# Patient Record
Sex: Female | Born: 1935 | Race: White | Hispanic: No | Smoking: Former smoker
Health system: Southern US, Community
[De-identification: ages and names within clinical notes are randomized; demographics above are authoritative.]

## PROBLEM LIST (undated history)

## (undated) DIAGNOSIS — L02415 Cutaneous abscess of right lower limb: Secondary | ICD-10-CM

## (undated) DIAGNOSIS — E785 Hyperlipidemia, unspecified: Secondary | ICD-10-CM

## (undated) DIAGNOSIS — I10 Essential (primary) hypertension: Secondary | ICD-10-CM

## (undated) DIAGNOSIS — K269 Duodenal ulcer, unspecified as acute or chronic, without hemorrhage or perforation: Secondary | ICD-10-CM

## (undated) DIAGNOSIS — J189 Pneumonia, unspecified organism: Secondary | ICD-10-CM

## (undated) DIAGNOSIS — F329 Major depressive disorder, single episode, unspecified: Secondary | ICD-10-CM

## (undated) DIAGNOSIS — C801 Malignant (primary) neoplasm, unspecified: Secondary | ICD-10-CM

## (undated) DIAGNOSIS — M1990 Unspecified osteoarthritis, unspecified site: Secondary | ICD-10-CM

## (undated) DIAGNOSIS — H3530 Unspecified macular degeneration: Secondary | ICD-10-CM

## (undated) DIAGNOSIS — T8131XA Disruption of external operation (surgical) wound, not elsewhere classified, initial encounter: Secondary | ICD-10-CM

## (undated) DIAGNOSIS — Z8719 Personal history of other diseases of the digestive system: Secondary | ICD-10-CM

## (undated) DIAGNOSIS — R3915 Urgency of urination: Secondary | ICD-10-CM

## (undated) DIAGNOSIS — M25569 Pain in unspecified knee: Secondary | ICD-10-CM

## (undated) DIAGNOSIS — K449 Diaphragmatic hernia without obstruction or gangrene: Secondary | ICD-10-CM

## (undated) DIAGNOSIS — M549 Dorsalgia, unspecified: Secondary | ICD-10-CM

## (undated) DIAGNOSIS — K922 Gastrointestinal hemorrhage, unspecified: Secondary | ICD-10-CM

## (undated) HISTORY — DX: Unspecified macular degeneration: H35.30

## (undated) HISTORY — PX: HERNIA REPAIR: SHX51

## (undated) HISTORY — DX: Essential (primary) hypertension: I10

## (undated) HISTORY — DX: Disruption of external operation (surgical) wound, not elsewhere classified, initial encounter: T81.31XA

## (undated) HISTORY — PX: THYROIDECTOMY: SHX17

## (undated) HISTORY — DX: Major depressive disorder, single episode, unspecified: F32.9

## (undated) HISTORY — DX: Hyperlipidemia, unspecified: E78.5

## (undated) HISTORY — PX: TUBAL LIGATION: SHX77

## (undated) HISTORY — DX: Cutaneous abscess of right lower limb: L02.415

## (undated) HISTORY — PX: CARDIAC CATHETERIZATION: SHX172

## (undated) HISTORY — PX: OTHER SURGICAL HISTORY: SHX169

## (undated) HISTORY — PX: EYE SURGERY: SHX253

## (undated) HISTORY — PX: RESECTION DISTAL CLAVICAL: SHX5053

## (undated) HISTORY — DX: Diaphragmatic hernia without obstruction or gangrene: K44.9

## (undated) HISTORY — DX: Gastrointestinal hemorrhage, unspecified: K92.2

## (undated) HISTORY — DX: Dorsalgia, unspecified: M54.9

## (undated) HISTORY — DX: Pain in unspecified knee: M25.569

---
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
## 1973-05-29 HISTORY — PX: ABDOMINAL HYSTERECTOMY: SHX81

## 1987-05-30 HISTORY — PX: OOPHORECTOMY: SHX86

## 1987-05-30 HISTORY — PX: BILATERAL SALPINGECTOMY: SHX5743

## 1997-05-29 LAB — HM COLONOSCOPY

## 1997-12-30 ENCOUNTER — Other Ambulatory Visit: Admission: RE | Admit: 1997-12-30 | Discharge: 1997-12-30 | Payer: Self-pay | Admitting: *Deleted

## 1998-05-29 HISTORY — PX: BACK SURGERY: SHX140

## 1998-07-19 ENCOUNTER — Ambulatory Visit (HOSPITAL_COMMUNITY): Admission: RE | Admit: 1998-07-19 | Discharge: 1998-07-19 | Payer: Self-pay | Admitting: *Deleted

## 1998-07-21 ENCOUNTER — Ambulatory Visit (HOSPITAL_COMMUNITY): Admission: RE | Admit: 1998-07-21 | Discharge: 1998-07-21 | Payer: Self-pay | Admitting: Gastroenterology

## 1998-07-21 ENCOUNTER — Encounter: Payer: Self-pay | Admitting: Gastroenterology

## 1999-01-17 ENCOUNTER — Other Ambulatory Visit: Admission: RE | Admit: 1999-01-17 | Discharge: 1999-01-17 | Payer: Self-pay | Admitting: *Deleted

## 1999-02-17 HISTORY — PX: LUMBAR LAMINECTOMY: SHX95

## 1999-05-30 HISTORY — PX: ROTATOR CUFF REPAIR: SHX139

## 1999-09-13 ENCOUNTER — Ambulatory Visit (HOSPITAL_COMMUNITY): Admission: RE | Admit: 1999-09-13 | Discharge: 1999-09-13 | Payer: Self-pay | Admitting: *Deleted

## 1999-10-01 ENCOUNTER — Emergency Department (HOSPITAL_COMMUNITY): Admission: EM | Admit: 1999-10-01 | Discharge: 1999-10-01 | Payer: Self-pay

## 2000-02-06 ENCOUNTER — Encounter: Payer: Self-pay | Admitting: Orthopedic Surgery

## 2000-02-06 ENCOUNTER — Encounter: Admission: RE | Admit: 2000-02-06 | Discharge: 2000-02-06 | Payer: Self-pay | Admitting: Orthopedic Surgery

## 2000-02-07 ENCOUNTER — Ambulatory Visit (HOSPITAL_BASED_OUTPATIENT_CLINIC_OR_DEPARTMENT_OTHER): Admission: RE | Admit: 2000-02-07 | Discharge: 2000-02-08 | Payer: Self-pay | Admitting: Orthopedic Surgery

## 2000-09-19 ENCOUNTER — Encounter: Payer: Self-pay | Admitting: Internal Medicine

## 2000-09-19 ENCOUNTER — Ambulatory Visit (HOSPITAL_COMMUNITY): Admission: RE | Admit: 2000-09-19 | Discharge: 2000-09-19 | Payer: Self-pay | Admitting: Internal Medicine

## 2000-10-23 ENCOUNTER — Encounter: Payer: Self-pay | Admitting: Internal Medicine

## 2001-12-05 ENCOUNTER — Ambulatory Visit (HOSPITAL_COMMUNITY): Admission: RE | Admit: 2001-12-05 | Discharge: 2001-12-05 | Payer: Self-pay | Admitting: Internal Medicine

## 2001-12-05 ENCOUNTER — Encounter: Payer: Self-pay | Admitting: Internal Medicine

## 2001-12-09 ENCOUNTER — Encounter: Payer: Self-pay | Admitting: Internal Medicine

## 2003-05-26 ENCOUNTER — Ambulatory Visit (HOSPITAL_COMMUNITY): Admission: RE | Admit: 2003-05-26 | Discharge: 2003-05-26 | Payer: Self-pay | Admitting: Internal Medicine

## 2004-03-30 ENCOUNTER — Ambulatory Visit: Payer: Self-pay | Admitting: Internal Medicine

## 2004-06-28 ENCOUNTER — Ambulatory Visit: Payer: Self-pay | Admitting: Family Medicine

## 2004-07-20 ENCOUNTER — Ambulatory Visit: Payer: Self-pay | Admitting: Internal Medicine

## 2004-07-22 ENCOUNTER — Ambulatory Visit: Payer: Self-pay | Admitting: Internal Medicine

## 2004-09-20 ENCOUNTER — Ambulatory Visit: Payer: Self-pay | Admitting: Internal Medicine

## 2005-03-29 ENCOUNTER — Ambulatory Visit: Payer: Self-pay | Admitting: Internal Medicine

## 2005-07-05 ENCOUNTER — Ambulatory Visit (HOSPITAL_COMMUNITY): Admission: RE | Admit: 2005-07-05 | Discharge: 2005-07-05 | Payer: Self-pay | Admitting: Internal Medicine

## 2005-08-14 ENCOUNTER — Ambulatory Visit: Payer: Self-pay | Admitting: Internal Medicine

## 2005-09-25 ENCOUNTER — Ambulatory Visit: Payer: Self-pay | Admitting: Internal Medicine

## 2005-12-13 ENCOUNTER — Ambulatory Visit: Payer: Self-pay | Admitting: Internal Medicine

## 2006-02-19 ENCOUNTER — Ambulatory Visit: Payer: Self-pay | Admitting: Internal Medicine

## 2006-02-27 ENCOUNTER — Ambulatory Visit: Payer: Self-pay | Admitting: Internal Medicine

## 2006-03-05 ENCOUNTER — Encounter: Admission: RE | Admit: 2006-03-05 | Discharge: 2006-03-05 | Payer: Self-pay | Admitting: Internal Medicine

## 2006-03-09 ENCOUNTER — Ambulatory Visit: Payer: Self-pay | Admitting: Internal Medicine

## 2006-03-09 DIAGNOSIS — I10 Essential (primary) hypertension: Secondary | ICD-10-CM | POA: Insufficient documentation

## 2006-03-09 DIAGNOSIS — E785 Hyperlipidemia, unspecified: Secondary | ICD-10-CM

## 2006-03-09 HISTORY — DX: Essential (primary) hypertension: I10

## 2006-03-09 HISTORY — DX: Hyperlipidemia, unspecified: E78.5

## 2006-07-17 ENCOUNTER — Ambulatory Visit (HOSPITAL_COMMUNITY): Admission: RE | Admit: 2006-07-17 | Discharge: 2006-07-17 | Payer: Self-pay | Admitting: Internal Medicine

## 2006-09-06 ENCOUNTER — Encounter: Payer: Self-pay | Admitting: Internal Medicine

## 2006-10-03 ENCOUNTER — Ambulatory Visit: Payer: Self-pay | Admitting: Internal Medicine

## 2006-10-03 LAB — CONVERTED CEMR LAB
ALT: 15 units/L (ref 0–40)
AST: 19 units/L (ref 0–37)
Albumin: 3.8 g/dL (ref 3.5–5.2)
Alkaline Phosphatase: 47 units/L (ref 39–117)
BUN: 31 mg/dL — ABNORMAL HIGH (ref 6–23)
Bilirubin, Direct: 0.1 mg/dL (ref 0.0–0.3)
CO2: 33 meq/L — ABNORMAL HIGH (ref 19–32)
Calcium: 9.3 mg/dL (ref 8.4–10.5)
Chloride: 106 meq/L (ref 96–112)
Cholesterol: 178 mg/dL (ref 0–200)
Creatinine, Ser: 0.9 mg/dL (ref 0.4–1.2)
Direct LDL: 90 mg/dL
GFR calc Af Amer: 76 mL/min
GFR calc non Af Amer: 63 mL/min
Glucose, Bld: 100 mg/dL — ABNORMAL HIGH (ref 70–99)
HDL: 54.7 mg/dL (ref >39.0)
Potassium: 3.6 meq/L (ref 3.5–5.1)
Sodium: 143 meq/L (ref 135–145)
Total Bilirubin: 1 mg/dL (ref 0.3–1.2)
Total CHOL/HDL Ratio: 3.3
Total Protein: 6.2 g/dL (ref 6.0–8.3)
Triglycerides: 251 mg/dL (ref 0–149)
VLDL: 50 mg/dL — ABNORMAL HIGH (ref 0–40)

## 2006-12-17 DIAGNOSIS — K449 Diaphragmatic hernia without obstruction or gangrene: Secondary | ICD-10-CM

## 2006-12-17 HISTORY — DX: Diaphragmatic hernia without obstruction or gangrene: K44.9

## 2007-02-25 ENCOUNTER — Ambulatory Visit: Payer: Self-pay | Admitting: Internal Medicine

## 2007-02-25 ENCOUNTER — Ambulatory Visit: Payer: Self-pay | Admitting: Cardiology

## 2007-02-25 ENCOUNTER — Observation Stay (HOSPITAL_COMMUNITY): Admission: EM | Admit: 2007-02-25 | Discharge: 2007-02-27 | Payer: Self-pay | Admitting: Emergency Medicine

## 2007-03-06 ENCOUNTER — Encounter: Payer: Self-pay | Admitting: Internal Medicine

## 2007-03-07 ENCOUNTER — Ambulatory Visit: Payer: Self-pay

## 2007-03-07 ENCOUNTER — Encounter: Payer: Self-pay | Admitting: Internal Medicine

## 2007-03-08 ENCOUNTER — Ambulatory Visit: Payer: Self-pay | Admitting: Internal Medicine

## 2007-03-08 ENCOUNTER — Encounter: Admission: RE | Admit: 2007-03-08 | Discharge: 2007-03-08 | Payer: Self-pay | Admitting: Gastroenterology

## 2007-03-08 DIAGNOSIS — M549 Dorsalgia, unspecified: Secondary | ICD-10-CM | POA: Insufficient documentation

## 2007-03-08 HISTORY — DX: Dorsalgia, unspecified: M54.9

## 2007-03-11 ENCOUNTER — Encounter: Payer: Self-pay | Admitting: Internal Medicine

## 2007-04-01 ENCOUNTER — Ambulatory Visit: Payer: Self-pay | Admitting: Cardiology

## 2007-04-23 ENCOUNTER — Ambulatory Visit: Payer: Self-pay | Admitting: Internal Medicine

## 2007-06-06 ENCOUNTER — Ambulatory Visit: Payer: Self-pay | Admitting: Internal Medicine

## 2007-06-06 LAB — CONVERTED CEMR LAB
ALT: 18 units/L (ref 0–35)
AST: 20 units/L (ref 0–37)
Albumin: 3.9 g/dL (ref 3.5–5.2)
Alkaline Phosphatase: 52 units/L (ref 39–117)
BUN: 27 mg/dL — ABNORMAL HIGH (ref 6–23)
Bilirubin, Direct: 0.2 mg/dL (ref 0.0–0.3)
CO2: 32 meq/L (ref 19–32)
Calcium: 9.2 mg/dL (ref 8.4–10.5)
Chloride: 97 meq/L (ref 96–112)
Cholesterol: 204 mg/dL (ref 0–200)
Creatinine, Ser: 0.9 mg/dL (ref 0.4–1.2)
Direct LDL: 118.7 mg/dL
GFR calc Af Amer: 76 mL/min
GFR calc non Af Amer: 63 mL/min
Glucose, Bld: 86 mg/dL (ref 70–99)
HDL: 52 mg/dL (ref >39.0)
Potassium: 3.6 meq/L (ref 3.5–5.1)
Sodium: 142 meq/L (ref 135–145)
TSH: 2.82 microintl units/mL (ref 0.35–5.50)
Total Bilirubin: 0.9 mg/dL (ref 0.3–1.2)
Total CHOL/HDL Ratio: 3.9
Total Protein: 6.4 g/dL (ref 6.0–8.3)
Triglycerides: 150 mg/dL — ABNORMAL HIGH (ref 0–149)
VLDL: 30 mg/dL (ref 0–40)

## 2007-06-19 ENCOUNTER — Ambulatory Visit: Payer: Self-pay | Admitting: Internal Medicine

## 2007-08-09 ENCOUNTER — Ambulatory Visit (HOSPITAL_COMMUNITY): Admission: RE | Admit: 2007-08-09 | Discharge: 2007-08-09 | Payer: Self-pay | Admitting: Internal Medicine

## 2007-09-05 ENCOUNTER — Encounter: Payer: Self-pay | Admitting: Internal Medicine

## 2007-09-24 ENCOUNTER — Ambulatory Visit: Payer: Self-pay | Admitting: Internal Medicine

## 2007-09-25 LAB — CONVERTED CEMR LAB
ALT: 19 units/L (ref 0–35)
AST: 26 units/L (ref 0–37)
Albumin: 3.8 g/dL (ref 3.5–5.2)
Alkaline Phosphatase: 56 units/L (ref 39–117)
BUN: 24 mg/dL — ABNORMAL HIGH (ref 6–23)
Bilirubin, Direct: 0.1 mg/dL (ref 0.0–0.3)
CO2: 31 meq/L (ref 19–32)
Calcium: 9.8 mg/dL (ref 8.4–10.5)
Chloride: 106 meq/L (ref 96–112)
Creatinine, Ser: 0.9 mg/dL (ref 0.4–1.2)
GFR calc Af Amer: 76 mL/min
GFR calc non Af Amer: 63 mL/min
Glucose, Bld: 162 mg/dL — ABNORMAL HIGH (ref 70–99)
Potassium: 4.2 meq/L (ref 3.5–5.1)
Sodium: 143 meq/L (ref 135–145)
Total Bilirubin: 0.8 mg/dL (ref 0.3–1.2)
Total Protein: 6.4 g/dL (ref 6.0–8.3)

## 2007-09-26 ENCOUNTER — Ambulatory Visit: Payer: Self-pay | Admitting: Cardiology

## 2007-10-02 ENCOUNTER — Ambulatory Visit: Payer: Self-pay | Admitting: Internal Medicine

## 2007-10-10 ENCOUNTER — Encounter: Payer: Self-pay | Admitting: Internal Medicine

## 2008-01-16 ENCOUNTER — Encounter: Payer: Self-pay | Admitting: Internal Medicine

## 2008-01-16 ENCOUNTER — Ambulatory Visit: Payer: Self-pay | Admitting: Internal Medicine

## 2008-02-25 ENCOUNTER — Ambulatory Visit: Payer: Self-pay | Admitting: Internal Medicine

## 2008-04-30 ENCOUNTER — Telehealth: Payer: Self-pay | Admitting: Internal Medicine

## 2008-06-02 ENCOUNTER — Ambulatory Visit: Payer: Self-pay | Admitting: Internal Medicine

## 2008-06-04 LAB — CONVERTED CEMR LAB
ALT: 16 units/L (ref 0–35)
AST: 23 units/L (ref 0–37)
Albumin: 3.8 g/dL (ref 3.5–5.2)
Alkaline Phosphatase: 43 units/L (ref 39–117)
BUN: 34 mg/dL — ABNORMAL HIGH (ref 6–23)
Bilirubin, Direct: 0.1 mg/dL (ref 0.0–0.3)
CO2: 32 meq/L (ref 19–32)
Calcium: 9.4 mg/dL (ref 8.4–10.5)
Chloride: 102 meq/L (ref 96–112)
Cholesterol: 180 mg/dL (ref 0–200)
Creatinine, Ser: 0.8 mg/dL (ref 0.4–1.2)
GFR calc Af Amer: 87 mL/min
GFR calc non Af Amer: 72 mL/min
Glucose, Bld: 98 mg/dL (ref 70–99)
HDL: 53.4 mg/dL (ref >39.0)
LDL Cholesterol: 106 mg/dL — ABNORMAL HIGH (ref 0–99)
Potassium: 3.5 meq/L (ref 3.5–5.1)
Sodium: 143 meq/L (ref 135–145)
Total Bilirubin: 0.7 mg/dL (ref 0.3–1.2)
Total CHOL/HDL Ratio: 3.4
Total Protein: 6.5 g/dL (ref 6.0–8.3)
Triglycerides: 105 mg/dL (ref 0–149)
VLDL: 21 mg/dL (ref 0–40)
Vit D, 1,25-Dihydroxy: 31 (ref 30–89)

## 2008-06-22 ENCOUNTER — Telehealth: Payer: Self-pay | Admitting: Internal Medicine

## 2008-06-25 ENCOUNTER — Ambulatory Visit: Payer: Self-pay | Admitting: Internal Medicine

## 2008-08-10 ENCOUNTER — Encounter: Admission: RE | Admit: 2008-08-10 | Discharge: 2008-08-10 | Payer: Self-pay | Admitting: Internal Medicine

## 2008-08-15 ENCOUNTER — Encounter: Payer: Self-pay | Admitting: Internal Medicine

## 2008-12-01 ENCOUNTER — Ambulatory Visit: Payer: Self-pay | Admitting: Internal Medicine

## 2008-12-01 DIAGNOSIS — F329 Major depressive disorder, single episode, unspecified: Secondary | ICD-10-CM | POA: Insufficient documentation

## 2008-12-01 DIAGNOSIS — F3289 Other specified depressive episodes: Secondary | ICD-10-CM

## 2008-12-01 HISTORY — DX: Other specified depressive episodes: F32.89

## 2008-12-01 HISTORY — DX: Major depressive disorder, single episode, unspecified: F32.9

## 2008-12-02 LAB — CONVERTED CEMR LAB
ALT: 16 units/L (ref 0–35)
AST: 24 units/L (ref 0–37)
Albumin: 3.8 g/dL (ref 3.5–5.2)
Alkaline Phosphatase: 49 units/L (ref 39–117)
BUN: 26 mg/dL — ABNORMAL HIGH (ref 6–23)
Bilirubin, Direct: 0.1 mg/dL (ref 0.0–0.3)
CO2: 34 meq/L — ABNORMAL HIGH (ref 19–32)
Calcium: 9.3 mg/dL (ref 8.4–10.5)
Chloride: 102 meq/L (ref 96–112)
Cholesterol: 183 mg/dL (ref 0–200)
Creatinine, Ser: 0.9 mg/dL (ref 0.4–1.2)
GFR calc non Af Amer: 62.62 mL/min (ref >60)
Glucose, Bld: 91 mg/dL (ref 70–99)
HDL: 62.7 mg/dL (ref >39.00)
LDL Cholesterol: 102 mg/dL — ABNORMAL HIGH (ref 0–99)
Potassium: 4.6 meq/L (ref 3.5–5.1)
Sodium: 144 meq/L (ref 135–145)
TSH: 1.63 microintl units/mL (ref 0.35–5.50)
Total Bilirubin: 0.9 mg/dL (ref 0.3–1.2)
Total CHOL/HDL Ratio: 3
Total Protein: 6.8 g/dL (ref 6.0–8.3)
Triglycerides: 92 mg/dL (ref 0.0–149.0)
VLDL: 18.4 mg/dL (ref 0.0–40.0)

## 2009-01-22 ENCOUNTER — Encounter
Admission: RE | Admit: 2009-01-22 | Discharge: 2009-04-22 | Payer: Self-pay | Admitting: Physical Medicine & Rehabilitation

## 2009-01-26 ENCOUNTER — Ambulatory Visit: Payer: Self-pay | Admitting: Physical Medicine & Rehabilitation

## 2009-01-28 ENCOUNTER — Ambulatory Visit (HOSPITAL_COMMUNITY)
Admission: RE | Admit: 2009-01-28 | Discharge: 2009-01-28 | Payer: Self-pay | Admitting: Physical Medicine & Rehabilitation

## 2009-02-23 ENCOUNTER — Ambulatory Visit: Payer: Self-pay | Admitting: Physical Medicine & Rehabilitation

## 2009-03-02 ENCOUNTER — Ambulatory Visit: Payer: Self-pay | Admitting: Internal Medicine

## 2009-03-25 ENCOUNTER — Ambulatory Visit: Payer: Self-pay | Admitting: Physical Medicine & Rehabilitation

## 2009-04-19 ENCOUNTER — Ambulatory Visit: Payer: Self-pay | Admitting: Physical Medicine & Rehabilitation

## 2009-05-06 ENCOUNTER — Ambulatory Visit: Payer: Self-pay | Admitting: Internal Medicine

## 2009-05-06 DIAGNOSIS — M25569 Pain in unspecified knee: Secondary | ICD-10-CM

## 2009-05-06 HISTORY — DX: Pain in unspecified knee: M25.569

## 2009-05-17 ENCOUNTER — Encounter
Admission: RE | Admit: 2009-05-17 | Discharge: 2009-05-19 | Payer: Self-pay | Admitting: Physical Medicine & Rehabilitation

## 2009-05-18 ENCOUNTER — Ambulatory Visit: Payer: Self-pay | Admitting: Physical Medicine & Rehabilitation

## 2009-06-01 ENCOUNTER — Encounter
Admission: RE | Admit: 2009-06-01 | Discharge: 2009-08-30 | Payer: Self-pay | Admitting: Physical Medicine & Rehabilitation

## 2009-06-03 ENCOUNTER — Ambulatory Visit: Payer: Self-pay | Admitting: Internal Medicine

## 2009-06-14 ENCOUNTER — Encounter
Admission: RE | Admit: 2009-06-14 | Discharge: 2009-09-12 | Payer: Self-pay | Admitting: Physical Medicine & Rehabilitation

## 2009-06-15 ENCOUNTER — Ambulatory Visit: Payer: Self-pay | Admitting: Physical Medicine & Rehabilitation

## 2009-06-22 ENCOUNTER — Ambulatory Visit: Payer: Self-pay | Admitting: Internal Medicine

## 2009-07-16 ENCOUNTER — Ambulatory Visit: Payer: Self-pay | Admitting: Physical Medicine & Rehabilitation

## 2009-07-29 ENCOUNTER — Encounter: Payer: Self-pay | Admitting: Internal Medicine

## 2009-08-11 ENCOUNTER — Encounter: Admission: RE | Admit: 2009-08-11 | Discharge: 2009-08-11 | Payer: Self-pay | Admitting: Internal Medicine

## 2009-08-11 LAB — HM MAMMOGRAPHY: HM Mammogram: NEGATIVE

## 2009-08-19 ENCOUNTER — Ambulatory Visit: Payer: Self-pay | Admitting: Physical Medicine & Rehabilitation

## 2009-08-24 ENCOUNTER — Telehealth (INDEPENDENT_AMBULATORY_CARE_PROVIDER_SITE_OTHER): Payer: Self-pay | Admitting: *Deleted

## 2009-08-31 ENCOUNTER — Ambulatory Visit: Payer: Self-pay | Admitting: Internal Medicine

## 2009-09-20 ENCOUNTER — Telehealth: Payer: Self-pay | Admitting: Internal Medicine

## 2009-09-27 ENCOUNTER — Encounter
Admission: RE | Admit: 2009-09-27 | Discharge: 2009-12-26 | Payer: Self-pay | Admitting: Physical Medicine & Rehabilitation

## 2009-09-30 ENCOUNTER — Ambulatory Visit: Payer: Self-pay | Admitting: Physical Medicine & Rehabilitation

## 2009-10-27 ENCOUNTER — Ambulatory Visit: Payer: Self-pay | Admitting: Physical Medicine & Rehabilitation

## 2009-11-08 ENCOUNTER — Telehealth: Payer: Self-pay | Admitting: *Deleted

## 2009-11-25 ENCOUNTER — Ambulatory Visit: Payer: Self-pay | Admitting: Physical Medicine & Rehabilitation

## 2009-12-16 ENCOUNTER — Ambulatory Visit: Payer: Self-pay | Admitting: Internal Medicine

## 2009-12-21 LAB — CONVERTED CEMR LAB
ALT: 14 units/L (ref 0–35)
AST: 20 units/L (ref 0–37)
Albumin: 4.3 g/dL (ref 3.5–5.2)
Alkaline Phosphatase: 49 units/L (ref 39–117)
BUN: 25 mg/dL — ABNORMAL HIGH (ref 6–23)
Basophils Absolute: 0.1 10*3/uL (ref 0.0–0.1)
Basophils Relative: 0.8 % (ref 0.0–3.0)
Bilirubin, Direct: 0.1 mg/dL (ref 0.0–0.3)
CO2: 32 meq/L (ref 19–32)
Calcium: 9.7 mg/dL (ref 8.4–10.5)
Chloride: 105 meq/L (ref 96–112)
Cholesterol: 174 mg/dL (ref 0–200)
Creatinine, Ser: 0.7 mg/dL (ref 0.4–1.2)
Eosinophils Absolute: 0.3 10*3/uL (ref 0.0–0.7)
Eosinophils Relative: 4.8 % (ref 0.0–5.0)
GFR calc non Af Amer: 78.31 mL/min (ref >60)
Glucose, Bld: 96 mg/dL (ref 70–99)
HCT: 35.5 % — ABNORMAL LOW (ref 36.0–46.0)
HDL: 54.4 mg/dL (ref >39.00)
Hemoglobin: 12.3 g/dL (ref 12.0–15.0)
LDL Cholesterol: 98 mg/dL (ref 0–99)
Lymphocytes Relative: 27.6 % (ref 12.0–46.0)
Lymphs Abs: 1.9 10*3/uL (ref 0.7–4.0)
MCHC: 34.5 g/dL (ref 30.0–36.0)
MCV: 89.9 fL (ref 78.0–100.0)
Monocytes Absolute: 0.5 10*3/uL (ref 0.1–1.0)
Monocytes Relative: 7.7 % (ref 3.0–12.0)
Neutro Abs: 4 10*3/uL (ref 1.4–7.7)
Neutrophils Relative %: 59.1 % (ref 43.0–77.0)
Platelets: 192 10*3/uL (ref 150.0–400.0)
Potassium: 3.8 meq/L (ref 3.5–5.1)
RBC: 3.95 M/uL (ref 3.87–5.11)
RDW: 15.1 % — ABNORMAL HIGH (ref 11.5–14.6)
Sodium: 145 meq/L (ref 135–145)
TSH: 1.4 microintl units/mL (ref 0.35–5.50)
Total Bilirubin: 0.6 mg/dL (ref 0.3–1.2)
Total CHOL/HDL Ratio: 3
Total Protein: 6.7 g/dL (ref 6.0–8.3)
Triglycerides: 108 mg/dL (ref 0.0–149.0)
VLDL: 21.6 mg/dL (ref 0.0–40.0)
WBC: 6.8 10*3/uL (ref 4.5–10.5)

## 2009-12-29 ENCOUNTER — Encounter
Admission: RE | Admit: 2009-12-29 | Discharge: 2010-03-29 | Payer: Self-pay | Admitting: Physical Medicine & Rehabilitation

## 2010-01-25 ENCOUNTER — Ambulatory Visit: Payer: Self-pay | Admitting: Physical Medicine & Rehabilitation

## 2010-02-23 ENCOUNTER — Ambulatory Visit: Payer: Self-pay | Admitting: Physical Medicine & Rehabilitation

## 2010-03-04 ENCOUNTER — Telehealth: Payer: Self-pay | Admitting: Internal Medicine

## 2010-03-21 ENCOUNTER — Encounter: Payer: Self-pay | Admitting: Internal Medicine

## 2010-03-25 ENCOUNTER — Encounter
Admission: RE | Admit: 2010-03-25 | Discharge: 2010-05-25 | Payer: Self-pay | Source: Home / Self Care | Attending: Physical Medicine & Rehabilitation | Admitting: Physical Medicine & Rehabilitation

## 2010-03-30 ENCOUNTER — Ambulatory Visit: Payer: Self-pay | Admitting: Physical Medicine & Rehabilitation

## 2010-04-26 ENCOUNTER — Ambulatory Visit: Payer: Self-pay | Admitting: Physical Medicine & Rehabilitation

## 2010-05-05 ENCOUNTER — Emergency Department (HOSPITAL_COMMUNITY): Admission: EM | Admit: 2010-05-05 | Discharge: 2009-09-17 | Payer: Self-pay | Admitting: Emergency Medicine

## 2010-05-30 ENCOUNTER — Encounter
Admission: RE | Admit: 2010-05-30 | Discharge: 2010-05-31 | Payer: Self-pay | Source: Home / Self Care | Attending: Physical Medicine & Rehabilitation | Admitting: Physical Medicine & Rehabilitation

## 2010-05-31 ENCOUNTER — Ambulatory Visit
Admission: RE | Admit: 2010-05-31 | Discharge: 2010-05-31 | Payer: Self-pay | Source: Home / Self Care | Attending: Physical Medicine & Rehabilitation | Admitting: Physical Medicine & Rehabilitation

## 2010-06-19 ENCOUNTER — Encounter: Payer: Self-pay | Admitting: Internal Medicine

## 2010-06-23 ENCOUNTER — Other Ambulatory Visit: Payer: Self-pay | Admitting: Internal Medicine

## 2010-06-23 ENCOUNTER — Ambulatory Visit
Admission: RE | Admit: 2010-06-23 | Discharge: 2010-06-23 | Payer: Self-pay | Source: Home / Self Care | Attending: Internal Medicine | Admitting: Internal Medicine

## 2010-06-23 LAB — HEPATIC FUNCTION PANEL
ALT: 14 U/L (ref 0–35)
AST: 21 U/L (ref 0–37)
Albumin: 4 g/dL (ref 3.5–5.2)
Alkaline Phosphatase: 57 U/L (ref 39–117)
Bilirubin, Direct: 0.1 mg/dL (ref 0.0–0.3)
Total Bilirubin: 0.7 mg/dL (ref 0.3–1.2)
Total Protein: 6.5 g/dL (ref 6.0–8.3)

## 2010-06-23 LAB — BASIC METABOLIC PANEL
BUN: 22 mg/dL (ref 6–23)
CO2: 32 mEq/L (ref 19–32)
Calcium: 9.8 mg/dL (ref 8.4–10.5)
Chloride: 99 mEq/L (ref 96–112)
Creatinine, Ser: 0.7 mg/dL (ref 0.4–1.2)
GFR: 87.72 mL/min (ref >60.00)
Glucose, Bld: 76 mg/dL (ref 70–99)
Potassium: 4.2 mEq/L (ref 3.5–5.1)
Sodium: 139 mEq/L (ref 135–145)

## 2010-06-23 LAB — LIPID PANEL
Cholesterol: 179 mg/dL (ref 0–200)
HDL: 63.4 mg/dL (ref >39.00)
LDL Cholesterol: 95 mg/dL (ref 0–99)
Total CHOL/HDL Ratio: 3
Triglycerides: 103 mg/dL (ref 0.0–149.0)
VLDL: 20.6 mg/dL (ref 0.0–40.0)

## 2010-06-24 ENCOUNTER — Encounter
Admission: RE | Admit: 2010-06-24 | Discharge: 2010-06-28 | Payer: Self-pay | Source: Home / Self Care | Attending: Physical Medicine & Rehabilitation | Admitting: Physical Medicine & Rehabilitation

## 2010-06-26 LAB — HM DEXA SCAN

## 2010-06-28 ENCOUNTER — Ambulatory Visit: Admit: 2010-06-28 | Payer: Self-pay | Admitting: Physical Medicine & Rehabilitation

## 2010-06-28 NOTE — Assessment & Plan Note (Signed)
Summary: arms tingling/njr   Vital Signs:  Patient Profile:   77 Years Old Female Weight:      133 pounds Temp:     98.7 degrees F oral Pulse rate:   78 / minute BP sitting:   138 / 70  (left arm)  Vitals Entered By: Gladis Riffle, RN (April 23, 2007 4:44 PM)                 Chief Complaint:  c/o occasional tingling and numbness left arm X 10 days.  History of Present Illness: 10 days of l hand tingling---tends to extend up arm as far as elbow. no pain. she denies trauma, no swelling, or erythema.  Current Allergies (reviewed today): No known allergies   Past Medical History:    Reviewed history from 12/17/2006 and no changes required:       Hyperlipidemia       Hypertension       hiatal hernia  Past Surgical History:    Reviewed history from 12/17/2006 and no changes required:       Hysterectomy, TAH   1989       Oophorectomy, bilateral 1989       Thyroidectomy  1968       rotator cuff surgery  2001       laminectomy with fusion  2000       laminectomy with fusion  2000       basal cell ca lip  2001       cardiac cath--no occlusions  1997   Social History:    Reviewed history from 03/08/2007 and no changes required:       Single       Former Smoker       Regular exercise-yes    Review of Systems       no other complaints in a complete ROS    Physical Exam  General:     Well-developed,well-nourished,in no acute distress; alert,appropriate and cooperative throughout examination Neck:     No deformities, masses, or tenderness noted. Lungs:     Normal respiratory effort, chest expands symmetrically. Lungs are clear to auscultation, no crackles or wheezes. Msk:     No deformity or scoliosis noted of thoracic or lumbar spine.   Extremities:     nl gait negative tinel's and phalen's Neurologic:     No cranial nerve deficits noted. Station and gait are normal.. Sensory, motor and coordinative functions appear intact.    Impression &  Recommendations:  Problem # 1:  DISTURBANCE OF SKIN SENSATION (ICD-782.0) paresthesia...unclear cause.  I suspect will self resolve. If sxs still present next week she will call me.   Complete Medication List: 1)  Misoprostol 200 Mcg Tabs (Misoprostol) .... Two times a day 2)  Diclofenac Sodium 75 Mg Tbec (Diclofenac sodium) .... Two times a day 3)  Hydrochlorothiazide 25 Mg Tabs (Hydrochlorothiazide) .... Take 1 tablet by mouth once a day 4)  Lovastatin 40 Mg Tabs (Lovastatin) .... Take 1 tablet by mouth once a day 5)  Flexeril 10 Mg Tabs (Cyclobenzaprine hcl) .... 1/2 at bedtime 6)  Ranitidine Hcl 150 Mg Caps (Ranitidine hcl) .... Take 1 capsule by mouth once a day 7)  Estrace 0.5 Mg Tabs (Estradiol) .... One by mouth daily 8)  Omeprazole 20 Mg Cpdr (Omeprazole) .... Take 1 capsule by mouth once a day     ]

## 2010-06-28 NOTE — Miscellaneous (Signed)
Summary: Living Will  Living Will   Imported By: Maryln Gottron 09/03/2009 10:47:47  _____________________________________________________________________  External Attachment:    Type:   Image     Comment:   External Document

## 2010-06-28 NOTE — Letter (Signed)
Summary: Dr Matthias Hughs note  Dr Matthias Hughs note   Imported By: Kassie Mends 03/19/2007 08:49:03  _____________________________________________________________________  External Attachment:    Type:   Image     Comment:   Dr Matthias Hughs note

## 2010-06-28 NOTE — Assessment & Plan Note (Signed)
Summary: important question/jls   Vital Signs:  Patient Profile:   77 Years Old Female Weight:      132 pounds (60.00 kg) Temp:     98.5 degrees F (36.94 degrees C) Pulse rate:   84 / minute BP sitting:   130 / 82  (left arm)  Vitals Entered By: Gladis Riffle, RN (September 24, 2007 10:31 AM)                 Chief Complaint:  discuss mid abdominal pain.  History of Present Illness: She is concerned with ovarian cancer. She has had prophylactic oophorectomy (fhx ovarain CA). She is concerned because of mid-abdominal "fat".  There has been no real change in body habitus. She does, however think that right side of abdomen is bigger than left. No other concerns. No change in appetite or BMs symptoms ongoing for several months. Discomfort rated as a 2/10 but can increase to 5/10.    Current Allergies (reviewed today): No known allergies   Past Medical History:    Reviewed history from 06/19/2007 and no changes required:       Hyperlipidemia       Hypertension       hiatal hernia          Past Surgical History:    Reviewed history from 12/17/2006 and no changes required:       Hysterectomy, TAH   1989       Oophorectomy, bilateral 1989       Thyroidectomy  1968       rotator cuff surgery  2001       laminectomy with fusion  2000       laminectomy with fusion  2000       basal cell ca lip  2001       cardiac cath--no occlusions  1997   Social History:    Reviewed history from 03/08/2007 and no changes required:       Single       Former Smoker       Regular exercise-yes    Review of Systems       no other complaints in a complete ROS    Physical Exam  General:     Well-developed,well-nourished,in no acute distress; alert,appropriate and cooperative throughout examination Head:     Normocephalic and atraumatic without obvious abnormalities. No apparent alopecia or balding. Eyes:     pupils equal and pupils round.   Ears:     R ear normal and L ear normal.    Neck:     No deformities, masses, or tenderness noted. Chest Wall:     No deformities, masses, or tenderness noted. Lungs:     Normal respiratory effort, chest expands symmetrically. Lungs are clear to auscultation, no crackles or wheezes. Heart:     Normal rate and regular rhythm. S1 and S2 normal without gallop, murmur, click, rub or other extra sounds. Abdomen:     active bowel sounds, soft, nontender. No masses palpated however abdomen is diffusely "doughy" Msk:     No deformity or scoliosis noted of thoracic or lumbar spine.   Pulses:     R radial normal and L radial normal.   Extremities:     No clubbing, cyanosis, edema, or deformity noted  Neurologic:     cranial nerves II-XII intact and gait normal.      Impression & Recommendations:  Problem # 1:  ABDOMINAL PAIN (ICD-789.00) abnormal exam reviewed previous laboratory.  CT abd and pelvis Orders: Venipuncture (04540) Radiology Referral (Radiology) TLB-BMP (Basic Metabolic Panel-BMET) (80048-METABOL) TLB-Hepatic/Liver Function Pnl (80076-HEPATIC)   Complete Medication List: 1)  Misoprostol 200 Mcg Tabs (Misoprostol) .... Two times a day 2)  Diclofenac Sodium 75 Mg Tbec (Diclofenac sodium) .... Two times a day 3)  Hydrochlorothiazide 25 Mg Tabs (Hydrochlorothiazide) .... Take 1 tablet by mouth once a day 4)  Lovastatin 40 Mg Tabs (Lovastatin) .... Take 1 tablet by mouth once a day 5)  Flexeril 10 Mg Tabs (Cyclobenzaprine hcl) .... 1/2 at bedtime 6)  Ranitidine Hcl 150 Mg Caps (Ranitidine hcl) .... Take 1 capsule by mouth once a day 7)  Estrace 0.5 Mg Tabs (Estradiol) .... One by mouth daily 8)  Omeprazole 20 Mg Cpdr (Omeprazole) .... Take 1 capsule by mouth once a day 9)  Omega 3 1200 Mg Caps (Omega-3 fatty acids) 10)  Oxycontin 10 Mg Tb12 (Oxycodone hcl) .... Take 1 tablet by mouth two times a day     ]

## 2010-06-28 NOTE — Progress Notes (Signed)
   Faxed Stress,12 lead over to [facility] w/ [facility] to fax [phone] [facility]  August 24, 2009 3:04 PM

## 2010-06-28 NOTE — Progress Notes (Signed)
Summary: suture removal.  Phone Note Call from Patient Call back at Home Phone [phone]   Caller: Patient--live Call For: Vanessa Sons MD Summary of Call: Had two sutures put in over right eye at [facility] on 11/04/09 by a nurse.  Now needs an order to have these removed on 6/15.  Fax order to [facility], nurse, at [phone]. Initial call taken by: Gladis Riffle, RN,  November 08, 2009 1:54 PM  Follow-up for Phone Call        per dr swords should be removed where put in.  Order will be faxed. Follow-up by: Gladis Riffle, RN,  November 09, 2009 8:05 AM

## 2010-06-28 NOTE — Assessment & Plan Note (Signed)
Summary: 3 month f/up//db rsc per pt/njr/PT RESCD/CCM   Vital Signs:  Patient Profile:   77 Years Old Female Weight:      132 pounds (60.00 kg) Temp:     98.5 degrees F (36.94 degrees C) oral Pulse rate:   82 / minute BP sitting:   152 / 92  (left arm)  Pt. in pain?   no  Vitals Entered By: Arcola Jansky, RN (June 19, 2007 12:20 PM)                  Chief Complaint:  6 MONTH F/U.  History of Present Illness:  BACK PAIN (ICD-724.5)-chronic no change HIATAL HERNIA (ICD-553.3)-no sxs  HYPERTENSION (ICD-401.9)- no sxs, tolerating meds without difficulty HYPERLIPIDEMIA (ICD-272.4)-tolerating meds  Past Medical History: Hyperlipidemia Hypertension hiatal hernia   Social History: Single Former Smoker Regular exercise-yes      Current Allergies: No known allergies   Past Medical History:    Hyperlipidemia    Hypertension    hiatal hernia          Review of Systems       no other complaints in a complete ROS    Physical Exam  General:     healthy-appearing elderly female.alert.   Head:     atraumatic, normocephalic. Eyes:     pupils are round and react to light. Ears:     R ear normal and L ear normal.   Nose:     no external deformity and no external erythema.   Neck:     No deformities, masses, or tenderness noted. Chest Wall:     No deformities, masses, or tenderness noted. Lungs:     Normal respiratory effort, chest expands symmetrically. Lungs are clear to auscultation, no crackles or wheezes. Heart:     normal rate, regular rhythm, no gallop, no JVD, and no HJR.   Abdomen:     active bowel sounds, soft, nontender. Msk:     No deformity or scoliosis noted of thoracic or lumbar spine.   Pulses:     R radial normal and L radial normal.   Extremities:     No clubbing, cyanosis, edema, or deformity noted  Neurologic:     alert & oriented X3 and gait normal.   Psych:     normally interactive, good eye contact, and not anxious  appearing.      Impression & Recommendations:  Problem # 1:  HYPERTENSION (ICD-401.9) repeat BP 130/60 Her updated medication list for this problem includes:    Hydrochlorothiazide 25 Mg Tabs (Hydrochlorothiazide) .Marland Kitchen... Take 1 tablet by mouth once a day  BP today: 152/92 Prior BP: 138/70 (04/23/2007)  Labs Reviewed: Creat: 0.9 (06/06/2007) Chol: 204 (06/06/2007)   HDL: 52.0 (06/06/2007)   LDL: DEL (06/06/2007)   TG: 150 (06/06/2007)   Problem # 2:  HYPERLIPIDEMIA (ICD-272.4) reviewed labs Her updated medication list for this problem includes:    Lovastatin 40 Mg Tabs (Lovastatin) .Marland Kitchen... Take 1 tablet by mouth once a day  Labs Reviewed: Chol: 204 (06/06/2007)   HDL: 52.0 (06/06/2007)   LDL: DEL (06/06/2007)   TG: 150 (06/06/2007) SGOT: 20 (06/06/2007)   SGPT: 18 (06/06/2007)   Problem # 3:  HIATAL HERNIA (ICD-553.3) no sxs   Problem # 4:  DISTURBANCE OF SKIN SENSATION (ICD-782.0) sxs have resolved.   Problem # 5:  BACK PAIN (ICD-724.5) chronic difficulty.  She has been evaluated in the past.  She continues to exercise and do remarkably well.  Continue current medications. Her updated medication list for this problem includes:    Diclofenac Sodium 75 Mg Tbec (Diclofenac sodium) .Marland Kitchen..Marland Kitchen Two times a day    Flexeril 10 Mg Tabs (Cyclobenzaprine hcl) .Marland Kitchen... 1/2 at bedtime   Complete Medication List: 1)  Misoprostol 200 Mcg Tabs (Misoprostol) .... Two times a day 2)  Diclofenac Sodium 75 Mg Tbec (Diclofenac sodium) .... Two times a day 3)  Hydrochlorothiazide 25 Mg Tabs (Hydrochlorothiazide) .... Take 1 tablet by mouth once a day 4)  Lovastatin 40 Mg Tabs (Lovastatin) .... Take 1 tablet by mouth once a day 5)  Flexeril 10 Mg Tabs (Cyclobenzaprine hcl) .... 1/2 at bedtime 6)  Ranitidine Hcl 150 Mg Caps (Ranitidine hcl) .... Take 1 capsule by mouth once a day 7)  Estrace 0.5 Mg Tabs (Estradiol) .... One by mouth daily 8)  Omeprazole 20 Mg Cpdr (Omeprazole) .... Take 1 capsule by  mouth once a day 9)  Omega 3 1200 Mg Caps (Omega-3 fatty acids)     ]

## 2010-06-28 NOTE — Consult Note (Signed)
Summary: Dr Matthias Hughs note  Dr Matthias Hughs note   Imported By: Kassie Mends 11/07/2007 08:53:56  _____________________________________________________________________  External Attachment:    Type:   Image     Comment:   Dr Matthias Hughs note

## 2010-06-28 NOTE — Assessment & Plan Note (Signed)
Summary: 6 MONTH ROA/JLS/PT RESCD/CCM [facility] PER PT/NJR   Vital Signs:  Patient Profile:   77 Years Old Female Weight:      130 pounds Temp:     98.2 degrees F oral Pulse rate:   78 / minute Pulse rhythm:   regular Resp:     12 per minute BP sitting:   110 / 62  Vitals Entered By: Lynann Beaver CMA (March 08, 2007 2:52 PM)                 Chief Complaint:  rov.  History of Present Illness:  Follow-Up Visit: htn, lipids, chronic back pain pt went to ED with chest discomfort---normal enzymes, she tells me she had a LBBB      This is an 77 year old woman who presents for Follow-up visit.  The patient denies chest pain, palpitations, dizziness, syncope, low blood sugar symptoms, high blood sugar symptoms, edema, SOB, DOE, PND, and orthopnea.  Since the last visit the patient notes no new problems or concerns.  The patient reports taking meds as prescribed.  When questioned about possible medication side effects, the patient notes none.    Current Allergies: No known allergies   Past Medical History:    Reviewed history from 12/17/2006 and no changes required:       Hyperlipidemia       Hypertension       hiatal hernia  Past Surgical History:    Reviewed history from 12/17/2006 and no changes required:       Hysterectomy, TAH   1989       Oophorectomy, bilateral 1989       Thyroidectomy  1968       rotator cuff surgery  2001       laminectomy with fusion  2000       laminectomy with fusion  2000       basal cell ca lip  2001       cardiac cath--no occlusions  1997   Social History:    Single    Former Smoker    Regular exercise-yes   Risk Factors:  Tobacco use:  quit    Year quit:  1955 Exercise:  yes   Review of Systems       no other complaints in a complete ROS   Physical Exam  General:     Well-developed,well-nourished,in no acute distress; alert,appropriate and cooperative throughout examination Head:     normocephalic and atraumatic.    Eyes:     pupils equal and pupils round.   Ears:     R ear normal and L ear normal.   Nose:     no external deformity and no external erythema.   Neck:     No deformities, masses, or tenderness noted. Lungs:     normal respiratory effort, no intercostal retractions, no accessory muscle use, normal breath sounds, no dullness, no fremitus, and no crackles.   Heart:     normal rate and regular rhythm.   Abdomen:     Bowel sounds positive,abdomen soft and non-tender without masses, organomegaly or hernias noted. Msk:     No deformity or scoliosis noted of thoracic or lumbar spine.   Pulses:     R and L carotid,radial,femoral,dorsalis pedis and posterior tibial pulses are full and equal bilaterally Skin:     Intact without suspicious lesions or rashes Cervical Nodes:     No lymphadenopathy noted Axillary Nodes:     No palpable  lymphadenopathy    Impression & Recommendations:  Problem # 1:  CHEST PAIN (ICD-786.50) resolved now but did have one day of chest discomfort after leaving the hospital---has had normal stress. She has seen dr buccini---has had upper GI---told she had hiatal hernia. Dr. Matthias Hughs put her on nexium and ranitidine at night.   Problem # 2:  HIATAL HERNIA (ICD-553.3) nexium  Problem # 3:  HYPERTENSION (ICD-401.9) continue curent meds Her updated medication list for this problem includes:    Hydrochlorothiazide 25 Mg Tabs (Hydrochlorothiazide) ..... Qd  BP today: 110/62  Labs Reviewed: Creat: 0.9 (10/03/2006) Chol: 178 (10/03/2006)   HDL: 54.7 (10/03/2006)   LDL: DEL (10/03/2006)   TG: 251 (10/03/2006)   Problem # 4:  HYPERLIPIDEMIA (ICD-272.4) adequate control Her updated medication list for this problem includes:    Lovastatin 40 Mg Tabs (Lovastatin)  Labs Reviewed: Chol: 178 (10/03/2006)   HDL: 54.7 (10/03/2006)   LDL: DEL (10/03/2006)   TG: 251 (10/03/2006) SGOT: 19 (10/03/2006)   SGPT: 15 (10/03/2006)   Problem # 5:  BACK PAIN (ICD-724.5)   lot [id number], EXP 30 jun 09, [employer] left deltoid IM, 0.5 cc.   discussed mobility-she has chronic back pain---see durable med equipment The following medications were removed from the medication list:    Cyclobenzaprine Hcl 10 Mg Tabs (Cyclobenzaprine hcl)    Voltaren 75 Mg Tbec (Diclofenac sodium)  Her updated medication list for this problem includes:    Diclofenac Sodium 75 Mg Tbec (Diclofenac sodium) ..... Bid    Flexeril 10 Mg Tabs (Cyclobenzaprine hcl)  Orders: Durable Medical Equipment (DME)   Complete Medication List: 1)  Misoprostol 200 Mcg Tabs (Misoprostol) .... Two times a day 2)  Diclofenac Sodium 75 Mg Tbec (Diclofenac sodium) .... Bid 3)  Hydrochlorothiazide 25 Mg Tabs (Hydrochlorothiazide) .... Qd 4)  Lovastatin 40 Mg Tabs (Lovastatin) 5)  Flexeril 10 Mg Tabs (Cyclobenzaprine hcl) 6)  Ranitidine Hcl 150 Mg Caps (Ranitidine hcl) 7)  Estrace 0.5 Mg Tabs (Estradiol) .... One by mouth daily  Other Orders: Influenza Vaccine MCR ([id number])   Patient Instructions: 1)  Please schedule a follow-up appointment in 3 months. 2)  BMP prior to visit, ICD-9: 3)  Hepatic Panel prior to visit, ICD-9: 4)  Lipid Panel prior to visit, ICD-9: 5)  TSH prior to visit, ICD-9:    ]  Influenza Vaccine    Vaccine Type: Fluvax MCR    Given by: Birdie Sons MD  Flu Vaccine Consent Questions    Do you have a history of severe allergic reactions to this vaccine? no    Any prior history of allergic reactions to egg and/or gelatin? no    Do you have a sensitivity to the preservative Thimersol? no    Do you have a past history of Guillan-Barre Syndrome? no    Do you currently have an acute febrile illness? no    Have you ever had a severe reaction to latex? no    Vaccine information given and explained to patient? yes    Are you currently pregnant? no

## 2010-06-28 NOTE — Progress Notes (Signed)
Summary: DIZZY, will have BP checked and call back  Phone Note Call from Patient Call back at Home Phone [phone]   Caller: PT LIVE Call For: SWORDS Summary of Call: SHE IS HAVING DIZZY SPELLS.  SHE HAS TRIED TO PINPOINT WHAT IS HAPPENING.  IT HAPPENS WHEN SHE LEANS OVER.  SHE WOULD LIKE TO BE WORKED IN WITH DR SWORDS TODAY.   Initial call taken by: Roselle Locus,  June 22, 2008 10:00 AM  Follow-up for Phone Call        Pt called back, this dizzy sensation is happening intermittently after meals when she is raising her getting up from sitting or standing.  Pt unsure of what her BP is.  Pt lives at [place] and will have it checked and call us back with reading. Sid Falcon LPN  June 22, 2008 10:24 AM   Additional Follow-up for Phone Call Additional follow up Details #1::        BP 169/83 BP 137/83 Additional Follow-up by: Lynann Beaver CMA,  June 22, 2008 12:04 PM    Additional Follow-up for Phone Call Additional follow up Details #2::    continue to monitor BP daily for one week fax results OV if she has any concerns Follow-up by: Birdie Sons MD,  June 22, 2008 2:21 PM  Additional Follow-up for Phone Call Additional follow up Details #3:: Details for Additional Follow-up Action Taken: Pt notified and appt scheduled. Additional Follow-up by: Lynann Beaver CMA,  June 22, 2008 2:45 PM

## 2010-06-28 NOTE — Letter (Signed)
Summary: [facility]  [facility]   Imported By: Maryln Gottron 04/08/2010 15:27:47  _____________________________________________________________________  External Attachment:    Type:   Image     Comment:   External Document

## 2010-06-28 NOTE — Assessment & Plan Note (Signed)
Summary: talk to dr swords/mhf   Vital Signs:  Patient Profile:   77 Years Old Female Weight:      131 pounds Temp:     98.4 degrees F  Vitals Entered By: Gladis Riffle, RN (February 25, 2008 12:07 PM)                 Chief Complaint:  rov per pt request.  History of Present Illness:  Follow-Up Visit      This is an 77 year old woman who presents for Follow-up visit.  The patient denies chest pain, palpitations, dizziness, syncope, low blood sugar symptoms, high blood sugar symptoms, edema, SOB, DOE, PND, and orthopnea.  Since the last visit the patient notes no new problems or concerns.  The patient reports taking meds as prescribed and not monitoring BP.  When questioned about possible medication side effects, the patient notes none.    Past Medical History: Hyperlipidemia Hypertension hiatal hernia   Past Surgical History: Hysterectomy, TAH   1989 Oophorectomy, bilateral 1989 Thyroidectomy  1968 rotator cuff surgery  2001 laminectomy with fusion  2000 laminectomy with fusion  2000 basal cell ca lip  2001 cardiac cath--no occlusions  1997  Social History: Single Former Smoker Regular exercise-yes  Family History:  no other complaints in a complete ROS     Updated Prior Medication List: MISOPROSTOL 200 MCG TABS (MISOPROSTOL) two times a day DICLOFENAC SODIUM 75 MG TBEC (DICLOFENAC SODIUM) two times a day HYDROCHLOROTHIAZIDE 25 MG TABS (HYDROCHLOROTHIAZIDE) Take 1 tablet by mouth once a day LOVASTATIN 40 MG TABS (LOVASTATIN) Take 1 tablet by mouth once a day FLEXERIL 10 MG TABS (CYCLOBENZAPRINE HCL) 1/2 at bedtime RANITIDINE HCL 150 MG CAPS (RANITIDINE HCL) Take 1 capsule by mouth once a day every PM ESTRACE 0.5 MG  TABS (ESTRADIOL) one by mouth daily OMEGA 3 1200 MG  CAPS (OMEGA-3 FATTY ACIDS) once daily OXYCONTIN 10 MG  TB12 (OXYCODONE HCL) Take 1 tablet by mouth two times a day LEXAPRO 10 MG TABS (ESCITALOPRAM OXALATE) 1/2 daily QC WOMENS DAILY MULTIVITAMIN   TABS (MULTIPLE VITAMINS-MINERALS) once daily CALTRATE 600+D PLUS 600-400 MG-UNIT CHEW (CALCIUM CARBONATE-VIT D-MIN) two daily  Current Allergies (reviewed today): No known allergies   Past Medical History:    Hyperlipidemia    Hypertension    hiatal hernia         Depression      Physical Exam  General:     Well-developed,well-nourished,in no acute distress; alert,appropriate and cooperative throughout examination Head:     normocephalic and atraumatic.   Eyes:     pupils equal and pupils round.   Ears:     R ear normal and L ear normal.   Nose:     no external deformity and no external erythema.   Neck:     No deformities, masses, or tenderness noted. Lungs:     Normal respiratory effort, chest expands symmetrically. Lungs are clear to auscultation, no crackles or wheezes. Heart:     Normal rate and regular rhythm. S1 and S2 normal without gallop, murmur, click, rub or other extra sounds. Abdomen:     Bowel sounds positive,abdomen soft and non-tender without masses, organomegaly or hernias noted. Msk:     No deformity or scoliosis noted of thoracic or lumbar spine.   Pulses:     R radial normal and L radial normal.   Extremities:     No clubbing, cyanosis, edema, or deformity noted  Neurologic:     cranial  nerves II-XII intact and gait normal.   Skin:     turgor normal and color normal.   Cervical Nodes:     no anterior cervical adenopathy and no posterior cervical adenopathy.   Psych:     good eye contact and not anxious appearing.      Impression & Recommendations:  Problem # 1:  ABDOMINAL PAIN (ICD-789.00) resolved has hx of GERD---ranitidine  Problem # 2:  HYPERTENSION (ICD-401.9) controlled Her updated medication list for this problem includes:    Hydrochlorothiazide 25 Mg Tabs (Hydrochlorothiazide) .Marland Kitchen... Take 1 tablet by mouth once a day  Prior BP: 126/64 (10/02/2007)  Labs Reviewed: Creat: 0.9 (09/24/2007) Chol: 204 (06/06/2007)   HDL:  52.0 (06/06/2007)   LDL: DEL (06/06/2007)   TG: 150 (06/06/2007)   Problem # 3:  HYPERLIPIDEMIA (ICD-272.4) Assessment: Unchanged  Her updated medication list for this problem includes:    Lovastatin 40 Mg Tabs (Lovastatin) .Marland Kitchen... Take 1 tablet by mouth once a day  Labs Reviewed: Chol: 204 (06/06/2007)   HDL: 52.0 (06/06/2007)   LDL: DEL (06/06/2007)   TG: 150 (06/06/2007) SGOT: 26 (09/24/2007)   SGPT: 19 (09/24/2007)   Problem # 4:  BACK PAIN (ICD-724.5) pain management Her updated medication list for this problem includes:    Diclofenac Sodium 75 Mg Tbec (Diclofenac sodium) .Marland Kitchen..Marland Kitchen Two times a day    Flexeril 10 Mg Tabs (Cyclobenzaprine hcl) .Marland Kitchen... 1/2 at bedtime    Oxycontin 10 Mg Tb12 (Oxycodone hcl) .Marland Kitchen... Take 1 tablet by mouth two times a day   Problem # 5:  DEPRESSION (ICD-311) continue lexapro---she feels much better Her updated medication list for this problem includes:    Lexapro 10 Mg Tabs (Escitalopram oxalate) .Marland Kitchen... 1/2 daily   Complete Medication List: 1)  Misoprostol 200 Mcg Tabs (Misoprostol) .... Two times a day 2)  Diclofenac Sodium 75 Mg Tbec (Diclofenac sodium) .... Two times a day 3)  Hydrochlorothiazide 25 Mg Tabs (Hydrochlorothiazide) .... Take 1 tablet by mouth once a day 4)  Lovastatin 40 Mg Tabs (Lovastatin) .... Take 1 tablet by mouth once a day 5)  Flexeril 10 Mg Tabs (Cyclobenzaprine hcl) .... 1/2 at bedtime 6)  Ranitidine Hcl 150 Mg Caps (Ranitidine hcl) .... Take 1 capsule by mouth once a day every pm 7)  Estrace 0.5 Mg Tabs (Estradiol) .... One by mouth daily 8)  Omega 3 1200 Mg Caps (Omega-3 fatty acids) .... Once daily 9)  Oxycontin 10 Mg Tb12 (Oxycodone hcl) .... Take 1 tablet by mouth two times a day 10)  Lexapro 10 Mg Tabs (Escitalopram oxalate) .... 1/2 daily 11)  Qc Womens Daily Multivitamin Tabs (Multiple vitamins-minerals) .... Once daily 12)  Caltrate 600+d Plus 600-400 Mg-unit Chew (Calcium carbonate-vit d-min) .... Two daily    ]

## 2010-06-28 NOTE — Progress Notes (Signed)
Summary: refill estrace  Phone Note Refill Request Message from:  pharmacy---live call  Refills Requested: Medication #1:  ESTRACE 0.5 MG  TABS one half by mouth daily send to [facility]---ph--[phone]  Initial call taken by: Warnell Forester,  March 04, 2010 10:21 AM    Prescriptions: ESTRACE 0.5 MG  TABS (ESTRADIOL) one half by mouth daily  #15 x 6   Entered by:   Duard Brady LPN   Authorized by:   Birdie Sons MD   Signed by:   Duard Brady LPN on 95/62/1308   Method used:   Faxed to ...       [facility] [phone]* (retail)       [address]       [place], [place]  [address]       Ph: [phone] or [phone]       Fax: [phone]   RxID:   [id number]

## 2010-06-28 NOTE — Progress Notes (Signed)
Summary: labs and dx codes  Phone Note Call from Patient Call back at [phone]   Caller: pt live Call For: Swords Summary of Call: Do you want patient  do labs before she comes back to see you.  And if so what labs and dx codes. Initial call taken by: Celine Ahr,  April 30, 2008 3:52 PM  Follow-up for Phone Call        no Follow-up by: Birdie Sons MD,  April 30, 2008 4:31 PM

## 2010-06-28 NOTE — Letter (Signed)
Summary: Dr. Matthias Hughs note  Dr. Matthias Hughs note   Imported By: Kassie Mends 03/20/2007 08:08:23  _____________________________________________________________________  External Attachment:    Type:   Image     Comment:   Dr. Matthias Hughs note

## 2010-06-28 NOTE — Procedures (Signed)
Summary: Colonoscopy Report/Dr. Molly Maduro Buccini  Colonoscopy Report/Dr. Molly Maduro Buccini   Imported By: Maryln Gottron 10/27/2009 15:13:45  _____________________________________________________________________  External Attachment:    Type:   Image     Comment:   External Document

## 2010-06-28 NOTE — Assessment & Plan Note (Signed)
Summary: R KNEE PAIN (PROBLEMS W/ AMBULATION) // RS   Vital Signs:  Patient profile:   77 year old female Weight:      129 pounds Temp:     97.5 degrees F Pulse rate:   80 / minute Resp:     12 per minute BP sitting:   150 / 82  (left arm)  Vitals Entered By: Gladis Riffle, RN (May 06, 2009 8:31 AM)  Procedure Note Last Tetanus: Td (02/03/2002)  Injections: Duration of symptoms: 3 weeks Indication: acute pain Consent signed: no  Procedure # 1: joint aspiration & injection    Technique: 20 g needle    Anesthesia: 1% lidocaine w/o epinephrine    Comment: I was unable to aspirate any fluid.    History of Present Illness: 4-6 week hx of intermittent Right knee pain. She describes situations where knee "collapses". She has not fallen. Reports frequent discomfort of knee when she goes from sitting to standing position.  No recent injury, no swelling, no erythema.  Sxs of "giving way" will occur at least daily.   All other systems reviewed and were negative   Preventive Screening-Counseling & Management  Alcohol-Tobacco     Smoking Status: quit > 6 months     Year Started: 1937     Year Quit: 1955  Current Problems (verified): 1)  Depressive Disorder  (ICD-311) 2)  Back Pain  (ICD-724.5) 3)  Hiatal Hernia  (ICD-553.3) 4)  Hypertension  (ICD-401.9) 5)  Hyperlipidemia  (ICD-272.4)  Current Medications (verified): 1)  Hydrochlorothiazide 25 Mg Tabs (Hydrochlorothiazide) .... Take 1 Tablet By Mouth Once A Day 2)  Lovastatin 40 Mg Tabs (Lovastatin) .... Take 1 Tablet By Mouth Once A Day 3)  Estrace 0.5 Mg  Tabs (Estradiol) .... One Half By Mouth Daily 4)  Omega 3 1200 Mg  Caps (Omega-3 Fatty Acids) .... Twice Daily 5)  Oxycodone-Acetaminophen 5-500 Mg Caps (Oxycodone-Acetaminophen) .... Take 1 Tablet By Mouth Three Times A Day During Waking Hours 6)  Lexapro 10 Mg Tabs (Escitalopram Oxalate) .... One By Mouth Daily 7)  Qc Womens Daily Multivitamin  Tabs (Multiple  Vitamins-Minerals) .... Once Daily 8)  Caltrate 600+d Plus 600-400 Mg-Unit Chew (Calcium Carbonate-Vit D-Min) .... Two Daily 9)  Vitamin D 2000 Unit Tabs (Cholecalciferol) .... Once Daily 10)  Celebrex 200 Mg Caps (Celecoxib) .... Take 1 Tablet By Mouth Once A Day 11)  Lutein 6 Mg Caps (Lutein) .... Once Daily  Allergies (verified): No Known Drug Allergies  Comments:  Nurse/Medical Assistant: c/o right knee giving way x 6 weeks without warning; usually uses cane  The patient's medications and allergies were reviewed with the patient and were updated in the Medication and Allergy Lists. Gladis Riffle, RN (May 06, 2009 8:36 AM)  Social History: Smoking Status:  quit > 6 months  Review of Systems       All other systems reviewed and were negative   Physical Exam  General:  Well-developed,well-nourished,in no acute distress; alert,appropriate and cooperative throughout examination Head:  normocephalic and atraumatic.   Msk:  she walks with a cane.  She has full range of motion of both knees but pain with full flexion of the right knee.  She has an effusion of the right knee.   Impression & Recommendations:  Problem # 1:  KNEE PAIN (ICD-719.46)  discussed the differential diagnosis of her knee pain.  Possibilities include some sort of soft tissue tear, soft tissue injury or osteoarthritis.  Unlikely that she  has some sort of inflammatory arthritis other than osteoarthritis.  Discussed potential workup.  It was decided after informed consent was given to the patient that we would proceed with knee injection.  She understands the risks.  She will call back if her symptoms do not improve. Her updated medication list for this problem includes:    Oxycodone-acetaminophen 5-500 Mg Caps (Oxycodone-acetaminophen) .Marland Kitchen... Take 1 tablet by mouth three times a day during waking hours    Celebrex 200 Mg Caps (Celecoxib) .Marland Kitchen... Take 1 tablet by mouth once a day  Orders: Joint Aspirate /  Injection, Intermediate (20605) Depo- Medrol 40mg  (J1030)  Complete Medication List: 1)  Hydrochlorothiazide 25 Mg Tabs (Hydrochlorothiazide) .... Take 1 tablet by mouth once a day 2)  Lovastatin 40 Mg Tabs (Lovastatin) .... Take 1 tablet by mouth once a day 3)  Estrace 0.5 Mg Tabs (Estradiol) .... One half by mouth daily 4)  Omega 3 1200 Mg Caps (Omega-3 fatty acids) .... Twice daily 5)  Oxycodone-acetaminophen 5-500 Mg Caps (Oxycodone-acetaminophen) .... Take 1 tablet by mouth three times a day during waking hours 6)  Lexapro 10 Mg Tabs (Escitalopram oxalate) .... One by mouth daily 7)  Qc Womens Daily Multivitamin Tabs (Multiple vitamins-minerals) .... Once daily 8)  Caltrate 600+d Plus 600-400 Mg-unit Chew (Calcium carbonate-vit d-min) .... Two daily 9)  Vitamin D 2000 Unit Tabs (Cholecalciferol) .... Once daily 10)  Celebrex 200 Mg Caps (Celecoxib) .... Take 1 tablet by mouth once a day 11)  Lutein 6 Mg Caps (Lutein) .... Once daily

## 2010-06-28 NOTE — Assessment & Plan Note (Signed)
Summary: 4 MONTH ROA/JLS   Vital Signs:  Patient Profile:   77 Years Old Female Weight:      128 pounds Temp:     98.3 degrees F Pulse rate:   78 / minute BP sitting:   134 / 88  (left arm)  Vitals Entered By: Gladis Riffle, RN (June 02, 2008 8:55 AM)                 Chief Complaint:  4 month rov and fasting.  History of Present Illness:  Follow-Up Visit: she continues to have chronic back pain but she remains very active      This is an 77 year old woman who presents for Follow-up visit.  The patient denies chest pain, palpitations, dizziness, syncope, low blood sugar symptoms, high blood sugar symptoms, edema, SOB, DOE, PND, and orthopnea.  Since the last visit the patient notes no new problems or concerns.  The patient reports taking meds as prescribed.  When questioned about possible medication side effects, the patient notes none.    she has questons about vitamin D  Past Medical History: Hyperlipidemia Hypertension hiatal hernia   Depression  Past Surgical History: Hysterectomy, TAH   1989 Oophorectomy, bilateral 1989 Thyroidectomy  1968 rotator cuff surgery  2001 laminectomy with fusion  2000 laminectomy with fusion  2000 basal cell ca lip  2001 cardiac cath--no occlusions  1997  Social History: Single Former Smoker Regular exercise-yes  Family History:   no other complaints in a complete ROS     Updated Prior Medication List: MISOPROSTOL 200 MCG TABS (MISOPROSTOL) two times a day DICLOFENAC SODIUM 75 MG TBEC (DICLOFENAC SODIUM) two times a day HYDROCHLOROTHIAZIDE 25 MG TABS (HYDROCHLOROTHIAZIDE) Take 1 tablet by mouth once a day LOVASTATIN 40 MG TABS (LOVASTATIN) Take 1 tablet by mouth once a day FLEXERIL 10 MG TABS (CYCLOBENZAPRINE HCL) 1/2 at bedtime ESTRACE 0.5 MG  TABS (ESTRADIOL) one by mouth daily OMEGA 3 1200 MG  CAPS (OMEGA-3 FATTY ACIDS) once daily OXYCONTIN 10 MG  TB12 (OXYCODONE HCL) Take 1 tablet by mouth two times a day LEXAPRO 10 MG  TABS (ESCITALOPRAM OXALATE) 1/2 daily QC WOMENS DAILY MULTIVITAMIN  TABS (MULTIPLE VITAMINS-MINERALS) once daily CALTRATE 600+D PLUS 600-400 MG-UNIT CHEW (CALCIUM CARBONATE-VIT D-MIN) two daily  Current Allergies (reviewed today): No known allergies       Physical Exam  General:     Well-developed,well-nourished,in no acute distress; alert,appropriate and cooperative throughout examination Head:     normocephalic and atraumatic.   Eyes:     pupils equal and pupils round.   Ears:     R ear normal and L ear normal.   Nose:     no external deformity and no external erythema.   Neck:     No deformities, masses, or tenderness noted. Chest Wall:     No deformities, masses, or tenderness noted. Lungs:     Normal respiratory effort, chest expands symmetrically. Lungs are clear to auscultation, no crackles or wheezes. Heart:     Normal rate and regular rhythm. S1 and S2 normal without gallop, murmur, click, rub or other extra sounds. Abdomen:     Bowel sounds positive,abdomen soft and non-tender without masses, organomegaly or hernias noted. Msk:     No deformity or scoliosis noted of thoracic or lumbar spine.   Pulses:     R radial normal and L radial normal.   Extremities:     No clubbing, cyanosis, edema, or deformity noted  Skin:  turgor normal and color normal.   Cervical Nodes:     no anterior cervical adenopathy and no posterior cervical adenopathy.   Psych:     good eye contact and not anxious appearing.      Impression & Recommendations:  Problem # 1:  BACK PAIN (ICD-724.5) chronic and unchanged...no further evaluation Her updated medication list for this problem includes:    Diclofenac Sodium 75 Mg Tbec (Diclofenac sodium) .Marland Kitchen..Marland Kitchen Two times a day    Flexeril 10 Mg Tabs (Cyclobenzaprine hcl) .Marland Kitchen... 1/2 at bedtime    Oxycontin 10 Mg Tb12 (Oxycodone hcl) .Marland Kitchen... Take 1 tablet by mouth two times a day   Problem # 2:  HYPERTENSION (ICD-401.9) will continue to  monitor Her updated medication list for this problem includes:    Hydrochlorothiazide 25 Mg Tabs (Hydrochlorothiazide) .Marland Kitchen... Take 1 tablet by mouth once a day  BP today: 134/88 Prior BP: 126/64 (10/02/2007)  Labs Reviewed: Creat: 0.9 (09/24/2007) Chol: 204 (06/06/2007)   HDL: 52.0 (06/06/2007)   LDL: 118.7 (06/06/2007)   TG: 150 (06/06/2007)  Orders: TLB-BMP (Basic Metabolic Panel-BMET) (80048-METABOL) T-Vitamin D (25-Hydroxy) (46962-95284)   Problem # 3:  HIATAL HERNIA (ICD-553.3) she has no sxs off of ranitidine and nexium Orders: T-Vitamin D (25-Hydroxy) (13244-01027)   Problem # 4:  HYPERLIPIDEMIA (ICD-272.4) needs folloup Her updated medication list for this problem includes:    Lovastatin 40 Mg Tabs (Lovastatin) .Marland Kitchen... Take 1 tablet by mouth once a day  Labs Reviewed: Chol: 204 (06/06/2007)   HDL: 52.0 (06/06/2007)   LDL: 118.7 (06/06/2007)   TG: 150 (06/06/2007) SGOT: 26 (09/24/2007)   SGPT: 19 (09/24/2007)  Orders: Venipuncture (25366) TLB-Hepatic/Liver Function Pnl (80076-HEPATIC) TLB-Lipid Panel (80061-LIPID) T-Vitamin D (25-Hydroxy) (44034-74259)   Complete Medication List: 1)  Misoprostol 200 Mcg Tabs (Misoprostol) .... Two times a day 2)  Diclofenac Sodium 75 Mg Tbec (Diclofenac sodium) .... Two times a day 3)  Hydrochlorothiazide 25 Mg Tabs (Hydrochlorothiazide) .... Take 1 tablet by mouth once a day 4)  Lovastatin 40 Mg Tabs (Lovastatin) .... Take 1 tablet by mouth once a day 5)  Flexeril 10 Mg Tabs (Cyclobenzaprine hcl) .... 1/2 at bedtime 6)  Estrace 0.5 Mg Tabs (Estradiol) .... One by mouth daily 7)  Omega 3 1200 Mg Caps (Omega-3 fatty acids) .... Once daily 8)  Oxycontin 10 Mg Tb12 (Oxycodone hcl) .... Take 1 tablet by mouth two times a day 9)  Lexapro 10 Mg Tabs (Escitalopram oxalate) .... 1/2 daily 10)  Qc Womens Daily Multivitamin Tabs (Multiple vitamins-minerals) .... Once daily 11)  Caltrate 600+d Plus 600-400 Mg-unit Chew (Calcium carbonate-vit  d-min) .... Two daily   Patient Instructions: 1)  Please schedule a follow-up appointment in 6 months.   ]

## 2010-06-28 NOTE — Assessment & Plan Note (Signed)
Summary: DISCUSS CT/CCM/PT RESCD PER DR/CCM   Vital Signs:  Patient Profile:   77 Years Old Female Pulse rate:   84 / minute BP sitting:   126 / 64  (left arm)  Vitals Entered By: Gladis Riffle, RN (Oct 02, 2007 2:45 PM)                 Chief Complaint:  discuss CT.    Current Allergies (reviewed today): No known allergies         Impression & Recommendations:  Problem # 1:  ABDOMINAL PAIN (ICD-789.00) discussion regarding CT 25 minutes early satiety---consider ENDO  Complete Medication List: 1)  Misoprostol 200 Mcg Tabs (Misoprostol) .... Two times a day 2)  Diclofenac Sodium 75 Mg Tbec (Diclofenac sodium) .... Two times a day 3)  Hydrochlorothiazide 25 Mg Tabs (Hydrochlorothiazide) .... Take 1 tablet by mouth once a day 4)  Lovastatin 40 Mg Tabs (Lovastatin) .... Take 1 tablet by mouth once a day 5)  Flexeril 10 Mg Tabs (Cyclobenzaprine hcl) .... 1/2 at bedtime 6)  Ranitidine Hcl 150 Mg Caps (Ranitidine hcl) .... Take 1 capsule by mouth once a day every pm 7)  Estrace 0.5 Mg Tabs (Estradiol) .... One by mouth daily 8)  Omeprazole 20 Mg Cpdr (Omeprazole) .... Take 1 capsule by mouth once a day in am 9)  Omega 3 1200 Mg Caps (Omega-3 fatty acids) .... Once daily 10)  Oxycontin 10 Mg Tb12 (Oxycodone hcl) .... Take 1 tablet by mouth two times a day    ]

## 2010-06-28 NOTE — Assessment & Plan Note (Signed)
Summary: 3 MTH ROV // RS   Vital Signs:  Patient profile:   77 year old female Weight:      128 pounds Temp:     98.1 degrees F oral Pulse rate:   88 / minute Pulse rhythm:   regular Resp:     12 per minute BP sitting:   146 / 74  (left arm) Cuff size:   regular  Vitals Entered By: Gladis Riffle, RN (August 31, 2009 10:22 AM) CC: 3 month rov, fasting--arthroscopy right knee tomorrow Is Patient Diabetic? No   CC:  3 month rov and fasting--arthroscopy right knee tomorrow.  History of Present Illness: Knee arthroscopy scheduled for tomorrow ... dr Sherlean Foot  Lipids: tolerating meds without difficulty  mood: tolerating lexapro  HTN---tolerating meds without difficulty  All other systems reviewed and were negative   Preventive Screening-Counseling & Management  Alcohol-Tobacco     Smoking Status: quit > 6 months     Year Started: 1939     Year Quit: 1955  Current Problems (verified): 1)  Knee Pain  (ICD-719.46) 2)  Depressive Disorder  (ICD-311) 3)  Back Pain  (ICD-724.5) 4)  Hiatal Hernia  (ICD-553.3) 5)  Hypertension  (ICD-401.9) 6)  Hyperlipidemia  (ICD-272.4)  Current Medications (verified): 1)  Hydrochlorothiazide 25 Mg Tabs (Hydrochlorothiazide) .... Take 1 Tablet By Mouth Once A Day 2)  Lovastatin 40 Mg Tabs (Lovastatin) .... Take 1 Tablet By Mouth Once A Day 3)  Estrace 0.5 Mg  Tabs (Estradiol) .... One Half By Mouth Daily 4)  Omega 3 1200 Mg  Caps (Omega-3 Fatty Acids) .... Twice Daily 5)  Oxycodone-Acetaminophen 5-500 Mg Caps (Oxycodone-Acetaminophen) .... Take 1 Tablet By Mouth Three Times A Day During Waking Hours 6)  Lexapro 10 Mg Tabs (Escitalopram Oxalate) .... One By Mouth Daily 7)  Qc Womens Daily Multivitamin  Tabs (Multiple Vitamins-Minerals) .... Once Daily 8)  Caltrate 600+d Plus 600-400 Mg-Unit Chew (Calcium Carbonate-Vit D-Min) .... Two Daily 9)  Vitamin D 2000 Unit Tabs (Cholecalciferol) .... Once Daily 10)  Celebrex 200 Mg Caps (Celecoxib) ....  Take 1 Tablet By Mouth Once A Day 11)  Lutein 6 Mg Caps (Lutein) .... Once Daily 12)  Cyclobenzaprine Hcl 10 Mg Tabs (Cyclobenzaprine Hcl) .... Take One Half By Mouth At Bedtime 13)  Aspirin 81 Mg Tabs (Aspirin) .... Once Daily  Allergies (verified): No Known Drug Allergies  Past History:  Past Medical History: Last updated: 02/25/2008 Hyperlipidemia Hypertension hiatal hernia   Depression  Past Surgical History: Last updated: 12/17/2006 Hysterectomy, TAH   1989 Oophorectomy, bilateral 1989 Thyroidectomy  1968 rotator cuff surgery  2001 laminectomy with fusion  2000 laminectomy with fusion  2000 basal cell ca lip  2001 cardiac cath--no occlusions  1997  Social History: Last updated: 03/08/2007 Single Former Smoker Regular exercise-yes  Risk Factors: Exercise: yes (03/08/2007)  Risk Factors: Smoking Status: quit > 6 months (08/31/2009)  Review of Systems       All other systems reviewed and were negative   Physical Exam  General:  alert and well-developed.   Head:  normocephalic and atraumatic.   Eyes:  pupils equal and pupils round.   Neck:  No deformities, masses, or tenderness noted. Chest Wall:  No deformities, masses, or tenderness noted. Lungs:  Normal respiratory effort, chest expands symmetrically. Lungs are clear to auscultation, no crackles or wheezes. Heart:  normal rate and regular rhythm.   Abdomen:  Bowel sounds positive,abdomen soft and non-tender without masses, organomegaly or hernias noted.  Msk:  No deformity or scoliosis noted of thoracic or lumbar spine.   Neurologic:  cranial nerves II-XII intact and gait normal.   Skin:  turgor normal and color normal.   Cervical Nodes:  no anterior cervical adenopathy and no posterior cervical adenopathy.   Psych:  normally interactive and good eye contact.     Impression & Recommendations:  Problem # 1:  KNEE PAIN (ICD-719.46) scheduled for surgery she should do fine from anesthessia point of  view Her updated medication list for this problem includes:    Oxycodone-acetaminophen 5-500 Mg Caps (Oxycodone-acetaminophen) .Marland Kitchen... Take 1 tablet by mouth three times a day during waking hours    Celebrex 200 Mg Caps (Celecoxib) .Marland Kitchen... Take 1 tablet by mouth once a day    Cyclobenzaprine Hcl 10 Mg Tabs (Cyclobenzaprine hcl) .Marland Kitchen... Take one half by mouth at bedtime    Aspirin 81 Mg Tabs (Aspirin) ..... Once daily  Problem # 2:  HYPERTENSION (ICD-401.9) reasonable contro she has not taken meds in 1 week Her updated medication list for this problem includes:    Hydrochlorothiazide 25 Mg Tabs (Hydrochlorothiazide) .Marland Kitchen... Take 1 tablet by mouth once a day  BP today: 146/74 Prior BP: 140/84 (06/03/2009)  Labs Reviewed: K+: 4.6 (12/01/2008) Creat: : 0.9 (12/01/2008)   Chol: 183 (12/01/2008)   HDL: 62.70 (12/01/2008)   LDL: 102 (12/01/2008)   TG: 92.0 (12/01/2008)  Problem # 3:  HYPERLIPIDEMIA (ICD-272.4) will recheck next office visit Her updated medication list for this problem includes:    Lovastatin 40 Mg Tabs (Lovastatin) .Marland Kitchen... Take 1 tablet by mouth once a day  Labs Reviewed: SGOT: 24 (12/01/2008)   SGPT: 16 (12/01/2008)   HDL:62.70 (12/01/2008), 53.4 (June 23, 2008)  LDL:102 (12/01/2008), 106 (Jun 23, 2008)  Chol:183 (12/01/2008), 180 (2008/06/23)  Trig:92.0 (12/01/2008), 105 (Jun 23, 2008) discussed death/dying/POA see updated note 08/15/08  Complete Medication List: 1)  Hydrochlorothiazide 25 Mg Tabs (Hydrochlorothiazide) .... Take 1 tablet by mouth once a day 2)  Lovastatin 40 Mg Tabs (Lovastatin) .... Take 1 tablet by mouth once a day 3)  Estrace 0.5 Mg Tabs (Estradiol) .... One half by mouth daily 4)  Omega 3 1200 Mg Caps (Omega-3 fatty acids) .... Twice daily 5)  Oxycodone-acetaminophen 5-500 Mg Caps (Oxycodone-acetaminophen) .... Take 1 tablet by mouth three times a day during waking hours 6)  Lexapro 10 Mg Tabs (Escitalopram oxalate) .... One by mouth daily 7)  Qc Womens Daily  Multivitamin Tabs (Multiple vitamins-minerals) .... Once daily 8)  Caltrate 600+d Plus 600-400 Mg-unit Chew (Calcium carbonate-vit d-min) .... Two daily 9)  Vitamin D 2000 Unit Tabs (Cholecalciferol) .... Once daily 10)  Celebrex 200 Mg Caps (Celecoxib) .... Take 1 tablet by mouth once a day 11)  Lutein 6 Mg Caps (Lutein) .... Once daily 12)  Cyclobenzaprine Hcl 10 Mg Tabs (Cyclobenzaprine hcl) .... Take one half by mouth at bedtime 13)  Aspirin 81 Mg Tabs (Aspirin) .... Once daily  Patient Instructions: 1)  Please schedule a follow-up appointment in 4 months.

## 2010-06-28 NOTE — Miscellaneous (Signed)
Summary: Health Care Power of [facility] Power of Attorney   Imported By: Maryln Gottron 12/22/2008 09:25:05  _____________________________________________________________________  External Attachment:    Type:   Image     Comment:   External Document

## 2010-06-28 NOTE — Assessment & Plan Note (Signed)
Summary: BP concerns/dm   Vital Signs:  Patient Profile:   77 Years Old Female Pulse rate:   86 / minute BP sitting:   130 / 82  (left arm)  Vitals Entered By: Gladis Riffle, RN (June 25, 2008 11:16 AM)                 Chief Complaint:  BP elevated to 181/81 and 179/79 and feeling dizzy.  History of Present Illness: she has some concern with BP borrowed a friend's bp cuff---180/90 nurse rechecked 130/80 was slightly dizzy, resolved she denies headache, cp or any other concerns  Past Medical History: Hyperlipidemia Hypertension hiatal hernia   Depression  Past Surgical History: Hysterectomy, TAH   1989 Oophorectomy, bilateral 1989 Thyroidectomy  1968 rotator cuff surgery  2001 laminectomy with fusion  2000 laminectomy with fusion  2000 basal cell ca lip  2001 cardiac cath--no occlusions  1997  Social History: Single Former Smoker Regular exercise-yes  Family History:  no other complaints in a complete ROS      Updated Prior Medication List: MISOPROSTOL 200 MCG TABS (MISOPROSTOL) two times a day DICLOFENAC SODIUM 75 MG TBEC (DICLOFENAC SODIUM) two times a day HYDROCHLOROTHIAZIDE 25 MG TABS (HYDROCHLOROTHIAZIDE) Take 1 tablet by mouth once a day LOVASTATIN 40 MG TABS (LOVASTATIN) Take 1 tablet by mouth once a day FLEXERIL 10 MG TABS (CYCLOBENZAPRINE HCL) 1/2 at bedtime ESTRACE 0.5 MG  TABS (ESTRADIOL) one by mouth daily OMEGA 3 1200 MG  CAPS (OMEGA-3 FATTY ACIDS) once daily OXYCONTIN 10 MG  TB12 (OXYCODONE HCL) Take 1 tablet by mouth two times a day LEXAPRO 10 MG TABS (ESCITALOPRAM OXALATE) 1/2 daily QC WOMENS DAILY MULTIVITAMIN  TABS (MULTIPLE VITAMINS-MINERALS) once daily CALTRATE 600+D PLUS 600-400 MG-UNIT CHEW (CALCIUM CARBONATE-VIT D-MIN) two daily VITAMIN D 2000 UNIT TABS (CHOLECALCIFEROL) once daily COQ10 100 MG CAPS (COENZYME Q10) once daily  Current Allergies (reviewed today): No known allergies       Physical Exam  General:      Well-developed,well-nourished,in no acute distress; alert,appropriate and cooperative throughout examination Head:     normocephalic and atraumatic.   Eyes:     pupils equal and pupils round.   Ears:     R ear normal and L ear normal.   Nose:     no external deformity and no external erythema.   Neck:     No deformities, masses, or tenderness noted. Chest Wall:     No deformities, masses, or tenderness noted. Lungs:     Normal respiratory effort, chest expands symmetrically. Lungs are clear to auscultation, no crackles or wheezes. Heart:     Normal rate and regular rhythm. S1 and S2 normal without gallop, murmur, click, rub or other extra sounds. Abdomen:     Bowel sounds positive,abdomen soft and non-tender without masses, organomegaly or hernias noted. Msk:     No deformity or scoliosis noted of thoracic or lumbar spine.   Neurologic:     cranial nerves II-XII intact and gait normal.   Skin:     turgor normal and color normal.   Cervical Nodes:     no anterior cervical adenopathy and no posterior cervical adenopathy.   Psych:     good eye contact and not anxious appearing.      Impression & Recommendations:  Problem # 1:  HYPERTENSION (ICD-401.9) discussed at length I see no reason to change meds based on an uncallibrated cuff BP here seems well controlled Her updated medication list for this problem  includes:    Hydrochlorothiazide 25 Mg Tabs (Hydrochlorothiazide) .Marland Kitchen... Take 1 tablet by mouth once a day  BP today: 130/82 Prior BP: 134/88 (06/02/2008)  Labs Reviewed: Creat: 0.8 (06/02/2008) Chol: 180 (06/02/2008)   HDL: 53.4 (06/02/2008)   LDL: 106 (06/02/2008)   TG: 105 (06/02/2008)   Complete Medication List: 1)  Misoprostol 200 Mcg Tabs (Misoprostol) .... Two times a day 2)  Diclofenac Sodium 75 Mg Tbec (Diclofenac sodium) .... Two times a day 3)  Hydrochlorothiazide 25 Mg Tabs (Hydrochlorothiazide) .... Take 1 tablet by mouth once a day 4)  Lovastatin 40 Mg  Tabs (Lovastatin) .... Take 1 tablet by mouth once a day 5)  Flexeril 10 Mg Tabs (Cyclobenzaprine hcl) .... 1/2 at bedtime 6)  Estrace 0.5 Mg Tabs (Estradiol) .... One by mouth daily 7)  Omega 3 1200 Mg Caps (Omega-3 fatty acids) .... Once daily 8)  Oxycontin 10 Mg Tb12 (Oxycodone hcl) .... Take 1 tablet by mouth two times a day 9)  Lexapro 10 Mg Tabs (Escitalopram oxalate) .... 1/2 daily 10)  Qc Womens Daily Multivitamin Tabs (Multiple vitamins-minerals) .... Once daily 11)  Caltrate 600+d Plus 600-400 Mg-unit Chew (Calcium carbonate-vit d-min) .... Two daily 12)  Vitamin D 2000 Unit Tabs (Cholecalciferol) .... Once daily 13)  Coq10 100 Mg Caps (Coenzyme q10) .... Once daily

## 2010-06-28 NOTE — Assessment & Plan Note (Signed)
Summary: 3 MONTH ROV/NJR   Vital Signs:  Patient profile:   77 year old female Weight:      126 pounds Temp:     98.2 degrees F Pulse rate:   72 / minute Resp:     12 per minute BP sitting:   150 / 92  (left arm)  Vitals Entered By: Gladis Riffle, RN (March 02, 2009 8:51 AM)  Serial Vital Signs/Assessments:  Time      Position  BP       Pulse  Resp  Temp     By                     135/82                         Birdie Sons MD   History of Present Illness:  Follow-Up Visit      This is an 77 year old woman who presents for Follow-up visit.  The patient denies chest pain, palpitations, dizziness, syncope, edema, SOB, DOE, PND, and orthopnea.  Since the last visit the patient notes no new problems or concerns.  The patient reports taking meds as prescribed and monitoring BP.  When questioned about possible medication side effects, the patient notes none.    She is seeing dr Wynn Banker. tapered off of NSAID---tried an herbal product. Off of the diclofenac---I was terrible. Started celebrex---great results.  Also changed oxycontin to oxycodone---not doing as well.   All other systems reviewed and were negative   Current Problems (verified): 1)  Depressive Disorder  (ICD-311) 2)  Back Pain  (ICD-724.5) 3)  Hiatal Hernia  (ICD-553.3) 4)  Hypertension  (ICD-401.9) 5)  Hyperlipidemia  (ICD-272.4)  Current Medications (verified): 1)  Hydrochlorothiazide 25 Mg Tabs (Hydrochlorothiazide) .... Take 1 Tablet By Mouth Once A Day 2)  Lovastatin 40 Mg Tabs (Lovastatin) .... Take 1 Tablet By Mouth Once A Day 3)  Flexeril 10 Mg Tabs (Cyclobenzaprine Hcl) .... 1/2 At Bedtime 4)  Estrace 0.5 Mg  Tabs (Estradiol) .... One By Mouth Daily 5)  Omega 3 1200 Mg  Caps (Omega-3 Fatty Acids) .... Once Daily 6)  Oxycodone-Acetaminophen 5-500 Mg Caps (Oxycodone-Acetaminophen) .... Take 1 Tablet By Mouth Three Times A Day During Waking Hours 7)  Lexapro 10 Mg Tabs (Escitalopram Oxalate) .... One By Mouth  Daily 8)  Qc Womens Daily Multivitamin  Tabs (Multiple Vitamins-Minerals) .... Once Daily 9)  Caltrate 600+d Plus 600-400 Mg-Unit Chew (Calcium Carbonate-Vit D-Min) .... Two Daily 10)  Vitamin D 2000 Unit Tabs (Cholecalciferol) .... Once Daily 11)  Celebrex 200 Mg Caps (Celecoxib) .... Take 1 Tablet By Mouth Once A Day  Allergies (verified): No Known Drug Allergies  Comments:  Nurse/Medical Assistant: 3 month rov--will get flu shot today home BPS 110-135/70  The patient's medications and allergies were reviewed with the patient and were updated in the Medication and Allergy Lists. Gladis Riffle, RN (March 02, 2009 8:58 AM)  Review of Systems       All other systems reviewed and were negative   Physical Exam  General:  Well-developed,well-nourished,in no acute distress; alert,appropriate and cooperative throughout examination Head:  normocephalic and atraumatic.   Eyes:  pupils equal and pupils round.   Ears:  R ear normal and L ear normal.   Neck:  No deformities, masses, or tenderness noted. Chest Wall:  No deformities, masses, or tenderness noted. Lungs:  Normal respiratory effort, chest expands symmetrically.  Lungs are clear to auscultation, no crackles or wheezes. Heart:  Normal rate and regular rhythm. S1 and S2 normal without gallop, murmur, click, rub or other extra sounds. Abdomen:  Bowel sounds positive,abdomen soft and non-tender without masses, organomegaly or hernias noted. Msk:  No deformity or scoliosis noted of thoracic or lumbar spine.   Pulses:  R radial normal and L radial normal.   Neurologic:  cranial nerves II-XII intact and gait normal.   Skin:  turgor normal and color normal.   Psych:  normally interactive and good eye contact.     Impression & Recommendations:  Problem # 1:  BACK PAIN (ICD-724.5) the least "bad" option is continued NSAID The following medications were removed from the medication list:    Diclofenac Sodium 75 Mg Tbec (Diclofenac  sodium) .Marland Kitchen..Marland Kitchen Two times a day    Flexeril 10 Mg Tabs (Cyclobenzaprine hcl) .Marland Kitchen... 1/2 at bedtime Her updated medication list for this problem includes:    Oxycodone-acetaminophen 5-500 Mg Caps (Oxycodone-acetaminophen) .Marland Kitchen... Take 1 tablet by mouth three times a day during waking hours    Celebrex 200 Mg Caps (Celecoxib) .Marland Kitchen... Take 1 tablet by mouth once a day  Problem # 2:  HYPERTENSION (ICD-401.9) continue current medications  Her updated medication list for this problem includes:    Hydrochlorothiazide 25 Mg Tabs (Hydrochlorothiazide) .Marland Kitchen... Take 1 tablet by mouth once a day  BP today: 150/92---see serial assessment Prior BP: 138/70 (12/01/2008)  Labs Reviewed: K+: 4.6 (12/01/2008) Creat: : 0.9 (12/01/2008)   Chol: 183 (12/01/2008)   HDL: 62.70 (12/01/2008)   LDL: 102 (12/01/2008)   TG: 92.0 (12/01/2008)  Problem # 3:  HYPERLIPIDEMIA (ICD-272.4) well controlled continue current medications  Her updated medication list for this problem includes:    Lovastatin 40 Mg Tabs (Lovastatin) .Marland Kitchen... Take 1 tablet by mouth once a day  Labs Reviewed: SGOT: 24 (12/01/2008)   SGPT: 16 (12/01/2008)   HDL:62.70 (12/01/2008), 53.4 (06/02/2008)  LDL:102 (12/01/2008), 106 (06/02/2008)  Chol:183 (12/01/2008), 180 (06/02/2008)  Trig:92.0 (12/01/2008), 105 (06/02/2008)  Problem # 4:  DEPRESSIVE DISORDER (ICD-311) well controlled continue current medications  Her updated medication list for this problem includes:    Lexapro 10 Mg Tabs (Escitalopram oxalate) ..... One by mouth daily  Complete Medication List: 1)  Hydrochlorothiazide 25 Mg Tabs (Hydrochlorothiazide) .... Take 1 tablet by mouth once a day 2)  Lovastatin 40 Mg Tabs (Lovastatin) .... Take 1 tablet by mouth once a day 3)  Estrace 0.5 Mg Tabs (Estradiol) .... One by mouth daily 4)  Omega 3 1200 Mg Caps (Omega-3 fatty acids) .... Once daily 5)  Oxycodone-acetaminophen 5-500 Mg Caps (Oxycodone-acetaminophen) .... Take 1 tablet by mouth three  times a day during waking hours 6)  Lexapro 10 Mg Tabs (Escitalopram oxalate) .... One by mouth daily 7)  Qc Womens Daily Multivitamin Tabs (Multiple vitamins-minerals) .... Once daily 8)  Caltrate 600+d Plus 600-400 Mg-unit Chew (Calcium carbonate-vit d-min) .... Two daily 9)  Vitamin D 2000 Unit Tabs (Cholecalciferol) .... Once daily 10)  Celebrex 200 Mg Caps (Celecoxib) .... Take 1 tablet by mouth once a day  Preventive Care Screening  Last Flu Shot:    Date:  03/02/2009    Results:  given   Patient Instructions: 1)  Please schedule a follow-up appointment in 3 months.

## 2010-06-28 NOTE — Progress Notes (Signed)
Summary: Call-A-Nurse Report    Call-A-Nurse Triage Call Report Triage Record Num: [id number] Operator: Edgar Frisk Patient Name: Vanessa Mason Call Date & Time: 09/16/2009 11:26:47PM Patient Phone: [phone] PCP: Valetta Mole. Swords Patient Gender: Female PCP Fax : [phone] Patient DOB: 31-Jul-1917 Practice Name: [facility] Reason for Call: Luis Abed LPN from [facility] , reports pt had sudden onset severe Left leg pain from hip to toes. No known injury. Rates pain 11 on 1-10 pain scale. To ED for evaluation now. Protocol(s) Used: Thigh Non-Injury Recommended Outcome per Protocol: See ED Immediately Reason for Outcome: Unbearable pain Care Advice:  ~ Protect the patient from falling or other harm.  ~ Another adult should drive. Write down provider's name. List or place the following in a bag for transport with the patient: current prescription and/or OTC medications; alternative treatments, therapies and medications; and street drugs.  ~ 09/16/2009 11:37:51PM Page 1 of 1 [email or web address]  Appended Document: Call-A-Nurse Report call in to make sure she is doing better.  If she needs office visit please schedule one at her convenience.  Appended Document: Call-A-Nurse Report Pt. is feeling fine today. Does not know what happened, and does not believe what the ER diagnosed ???  Some kind of muscle sprain.

## 2010-06-28 NOTE — Assessment & Plan Note (Signed)
Summary: 3 month fup---will fast//ccm/pt rscd from bump//ccm   Vital Signs:  Patient profile:   77 year old female Weight:      127 pounds Temp:     98.2 degrees F oral Pulse rate:   80 / minute Pulse rhythm:   regular Resp:     12 per minute BP sitting:   118 / 66  (left arm) Cuff size:   regular  Vitals Entered By: Gladis Riffle, RN (December 16, 2009 8:04 AM) CC: 3 month rov, fasting Is Patient Diabetic? No   CC:  3 month rov and fasting.  History of Present Illness:  Follow-Up Visit      This is a 77 year old woman who presents for Follow-up visit.  The patient denies chest pain and palpitations.  Since the last visit the patient notes no new problems or concerns and being seen by a specialist (has seen chiropractor and she thinks is helping).  The patient reports taking meds as prescribed.  When questioned about possible medication side effects, the patient notes none.    All other systems reviewed and were negative   Preventive Screening-Counseling & Management  Alcohol-Tobacco     Smoking Status: quit > 6 months     Year Started: 1937     Year Quit: 1955  Current Problems (verified): 1)  Knee Pain  (ICD-719.46) 2)  Depressive Disorder  (ICD-311) 3)  Back Pain  (ICD-724.5) 4)  Hiatal Hernia  (ICD-553.3) 5)  Hypertension  (ICD-401.9) 6)  Hyperlipidemia  (ICD-272.4)  Current Medications (verified): 1)  Hydrochlorothiazide 25 Mg Tabs (Hydrochlorothiazide) .... Take 1 Tablet By Mouth Once A Day 2)  Lovastatin 40 Mg Tabs (Lovastatin) .... Take 1 Tablet By Mouth Once A Day 3)  Estrace 0.5 Mg  Tabs (Estradiol) .... One Half By Mouth Daily 4)  Krill Oil 1000 Mg Caps (Krill Oil) .... Once Daily 5)  Oxycodone-Acetaminophen 5-500 Mg Caps (Oxycodone-Acetaminophen) .... Take 1 Tablet By Mouth Four Times A Day During Waking Hours 6)  Lexapro 10 Mg Tabs (Escitalopram Oxalate) .... One By Mouth Daily 7)  Qc Womens Daily Multivitamin  Tabs (Multiple Vitamins-Minerals) .... Once  Daily 8)  Caltrate 600+d Plus 600-400 Mg-Unit Chew (Calcium Carbonate-Vit D-Min) .... Two Daily 9)  Vitamin D 2000 Unit Tabs (Cholecalciferol) .... Once Daily 10)  Celebrex 200 Mg Caps (Celecoxib) .... Take 1 Tablet By Mouth Once A Day 11)  Lutein 6 Mg Caps (Lutein) .... Once Daily 12)  Cyclobenzaprine Hcl 10 Mg Tabs (Cyclobenzaprine Hcl) .... Take One Half By Mouth At Bedtime 13)  Aspirin 81 Mg Tabs (Aspirin) .... Once Daily  Allergies (verified): No Known Drug Allergies  Past History:  Past Medical History: Last updated: 02/25/2008 Hyperlipidemia Hypertension hiatal hernia   Depression  Past Surgical History: Last updated: 12/17/2006 Hysterectomy, TAH   1989 Oophorectomy, bilateral 1989 Thyroidectomy  1968 rotator cuff surgery  2001 laminectomy with fusion  2000 laminectomy with fusion  2000 basal cell ca lip  2001 cardiac cath--no occlusions  1997  Social History: Last updated: 03/08/2007 Single Former Smoker Regular exercise-yes  Risk Factors: Exercise: yes (03/08/2007)  Risk Factors: Smoking Status: quit > 6 months (12/16/2009)  Physical Exam  General:  alert and well-developed.   Head:  normocephalic and atraumatic.   Eyes:  pupils equal and pupils round.   Ears:  R ear normal and L ear normal.   Neck:  No deformities, masses, or tenderness noted. Chest Wall:  No deformities, masses, or tenderness noted.  Lungs:  Normal respiratory effort, chest expands symmetrically. Lungs are clear to auscultation, no crackles or wheezes. Heart:  normal rate and regular rhythm.   Abdomen:  Bowel sounds positive,abdomen soft and non-tender without masses, organomegaly or hernias noted. Msk:  No deformity or scoliosis noted of thoracic or lumbar spine.   Neurologic:  cranial nerves II-XII intact and gait normal.     Impression & Recommendations:  Problem # 1:  HYPERTENSION (ICD-401.9)  controlled continue current medications  Her updated medication list for this  problem includes:    Hydrochlorothiazide 25 Mg Tabs (Hydrochlorothiazide) .Marland Kitchen... Take 1 tablet by mouth once a day  BP today: 118/66 Prior BP: 146/74 (08/31/2009)  Labs Reviewed: K+: 4.6 (12/01/2008) Creat: : 0.9 (12/01/2008)   Chol: 183 (12/01/2008)   HDL: 62.70 (12/01/2008)   LDL: 102 (12/01/2008)   TG: 92.0 (12/01/2008)  Orders: Venipuncture (30865) TLB-BMP (Basic Metabolic Panel-BMET) (80048-METABOL)  Problem # 2:  HYPERLIPIDEMIA (ICD-272.4)  check labs today Her updated medication list for this problem includes:    Lovastatin 40 Mg Tabs (Lovastatin) .Marland Kitchen... Take 1 tablet by mouth once a day  Labs Reviewed: SGOT: 24 (12/01/2008)   SGPT: 16 (12/01/2008)   HDL:62.70 (12/01/2008), 53.4 (06/02/2008)  LDL:102 (12/01/2008), 106 (06/02/2008)  Chol:183 (12/01/2008), 180 (06/02/2008)  Trig:92.0 (12/01/2008), 105 (06/02/2008)  Orders: TLB-Lipid Panel (80061-LIPID) TLB-Hepatic/Liver Function Pnl (80076-HEPATIC) TLB-TSH (Thyroid Stimulating Hormone) (84443-TSH)  Problem # 3:  BACK PAIN (ICD-724.5) she has done very well after chiropractor care Her updated medication list for this problem includes:    Oxycodone-acetaminophen 5-500 Mg Caps (Oxycodone-acetaminophen) .Marland Kitchen... Take 1 tablet by mouth four times a day during waking hours    Celebrex 200 Mg Caps (Celecoxib) .Marland Kitchen... Take 1 tablet by mouth once a day    Cyclobenzaprine Hcl 10 Mg Tabs (Cyclobenzaprine hcl) .Marland Kitchen... Take one half by mouth at bedtime    Aspirin 81 Mg Tabs (Aspirin) ..... Once daily  Complete Medication List: 1)  Hydrochlorothiazide 25 Mg Tabs (Hydrochlorothiazide) .... Take 1 tablet by mouth once a day 2)  Lovastatin 40 Mg Tabs (Lovastatin) .... Take 1 tablet by mouth once a day 3)  Estrace 0.5 Mg Tabs (Estradiol) .... One half by mouth daily 4)  Krill Oil 1000 Mg Caps (Krill oil) .... Once daily 5)  Oxycodone-acetaminophen 5-500 Mg Caps (Oxycodone-acetaminophen) .... Take 1 tablet by mouth four times a day during waking  hours 6)  Lexapro 10 Mg Tabs (Escitalopram oxalate) .... One by mouth daily 7)  Qc Womens Daily Multivitamin Tabs (Multiple vitamins-minerals) .... Once daily 8)  Caltrate 600+d Plus 600-400 Mg-unit Chew (Calcium carbonate-vit d-min) .... Two daily 9)  Vitamin D 2000 Unit Tabs (Cholecalciferol) .... Once daily 10)  Celebrex 200 Mg Caps (Celecoxib) .... Take 1 tablet by mouth once a day 11)  Lutein 6 Mg Caps (Lutein) .... Once daily 12)  Cyclobenzaprine Hcl 10 Mg Tabs (Cyclobenzaprine hcl) .... Take one half by mouth at bedtime 13)  Aspirin 81 Mg Tabs (Aspirin) .... Once daily  Other Orders: TLB-CBC Platelet - w/Differential (85025-CBCD)  Contraindications/Deferment of Procedures/Staging:    Test/Procedure: Colonoscopy    Reason for deferment: not indicated   Patient Instructions: 1)  Please schedule a follow-up appointment in 6 months.  Appended Document: Orders Update     Clinical Lists Changes  Orders: Added new Service order of Specimen Handling ([id number]) - Signed      Appended Document: 3 month fup---will fast//ccm/pt rscd from bump//ccm   Immunizations Administered:  Pneumonia Vaccine:  Vaccine Type: Pneumovax    Site: left deltoid    Mfr: Merck    Dose: 0.5 ml    Route: IM    Given by: Gladis Riffle, RN    Exp. Date: 05/28/2011    Lot #: [id number]

## 2010-06-28 NOTE — Assessment & Plan Note (Signed)
Summary: 3 month follow up/cjr   Vital Signs:  Patient profile:   77 year old female Weight:      129 pounds Temp:     97.9 degrees F Pulse rate:   84 / minute Resp:     12 per minute BP sitting:   140 / 84  (left arm)  Vitals Entered By: Gladis Riffle, RN (June 03, 2009 8:31 AM)   History of Present Illness: Knee pain much better but in the past week she has had two episodes of right knee---"giving out"  Back Pain---chronic---no change  Lipids---tolerating meds without difficulty  All other systems reviewed and were negative   Preventive Screening-Counseling & Management  Alcohol-Tobacco     Smoking Status: quit > 6 months     Year Started: 1937     Year Quit: 1955  Current Problems (verified): 1)  Knee Pain  (ICD-719.46) 2)  Depressive Disorder  (ICD-311) 3)  Back Pain  (ICD-724.5) 4)  Hiatal Hernia  (ICD-553.3) 5)  Hypertension  (ICD-401.9) 6)  Hyperlipidemia  (ICD-272.4)  Current Medications (verified): 1)  Hydrochlorothiazide 25 Mg Tabs (Hydrochlorothiazide) .... Take 1 Tablet By Mouth Once A Day 2)  Lovastatin 40 Mg Tabs (Lovastatin) .... Take 1 Tablet By Mouth Once A Day 3)  Estrace 0.5 Mg  Tabs (Estradiol) .... One Half By Mouth Daily 4)  Omega 3 1200 Mg  Caps (Omega-3 Fatty Acids) .... Twice Daily 5)  Oxycodone-Acetaminophen 5-500 Mg Caps (Oxycodone-Acetaminophen) .... Take 1 Tablet By Mouth Three Times A Day During Waking Hours 6)  Lexapro 10 Mg Tabs (Escitalopram Oxalate) .... One By Mouth Daily 7)  Qc Womens Daily Multivitamin  Tabs (Multiple Vitamins-Minerals) .... Once Daily 8)  Caltrate 600+d Plus 600-400 Mg-Unit Chew (Calcium Carbonate-Vit D-Min) .... Two Daily 9)  Vitamin D 2000 Unit Tabs (Cholecalciferol) .... Once Daily 10)  Celebrex 200 Mg Caps (Celecoxib) .... Take 1 Tablet By Mouth Once A Day 11)  Lutein 6 Mg Caps (Lutein) .... Once Daily  Allergies (verified): No Known Drug Allergies  Comments:  Nurse/Medical Assistant: 3 month  rov--states takes calcium 1200mg  two times a day with vit D and asking if too much  The patient's medications and allergies were reviewed with the patient and were updated in the Medication and Allergy Lists. Gladis Riffle, RN (June 03, 2009 8:33 AM)  Past History:  Past Medical History: Last updated: 02/25/2008 Hyperlipidemia Hypertension hiatal hernia   Depression  Past Surgical History: Last updated: 12/17/2006 Hysterectomy, TAH   1989 Oophorectomy, bilateral 1989 Thyroidectomy  1968 rotator cuff surgery  2001 laminectomy with fusion  2000 laminectomy with fusion  2000 basal cell ca lip  2001 cardiac cath--no occlusions  1997  Social History: Last updated: 03/08/2007 Single Former Smoker Regular exercise-yes  Risk Factors: Exercise: yes (03/08/2007)  Risk Factors: Smoking Status: quit > 6 months (06/03/2009)  Review of Systems       All other systems reviewed and were negative   Physical Exam  General:  Well-developed,well-nourished,in no acute distress; alert,appropriate and cooperative throughout examination Head:  normocephalic and atraumatic.   Eyes:  pupils equal and pupils round.   Ears:  R ear normal and L ear normal.   Neck:  No deformities, masses, or tenderness noted. Chest Wall:  No deformities, masses, or tenderness noted. Lungs:  Normal respiratory effort, chest expands symmetrically. Lungs are clear to auscultation, no crackles or wheezes. Heart:  Normal rate and regular rhythm. S1 and S2 normal without gallop, murmur,  click, rub or other extra sounds. Abdomen:  Bowel sounds positive,abdomen soft and non-tender without masses, organomegaly or hernias noted. Msk:  No deformity or scoliosis noted of thoracic or lumbar spine.   Pulses:  R radial normal and L radial normal.   Neurologic:  cranial nerves II-XII intact and gait normal.   Skin:  turgor normal and color normal.     Impression & Recommendations:  Problem # 1:  KNEE PAIN  (ICD-719.46)  will start with xray Her updated medication list for this problem includes:    Oxycodone-acetaminophen 5-500 Mg Caps (Oxycodone-acetaminophen) .Marland Kitchen... Take 1 tablet by mouth three times a day during waking hours    Celebrex 200 Mg Caps (Celecoxib) .Marland Kitchen... Take 1 tablet by mouth once a day  Orders: T-Knee Right 2 view ([id number])  Problem # 2:  HYPERTENSION (ICD-401.9) controlled Her updated medication list for this problem includes:    Hydrochlorothiazide 25 Mg Tabs (Hydrochlorothiazide) .Marland Kitchen... Take 1 tablet by mouth once a day  BP today: 140/84 Prior BP: 150/82 (05/06/2009)  Labs Reviewed: K+: 4.6 (12/01/2008) Creat: : 0.9 (12/01/2008)   Chol: 183 (12/01/2008)   HDL: 62.70 (12/01/2008)   LDL: 102 (12/01/2008)   TG: 92.0 (12/01/2008)  Problem # 3:  HYPERLIPIDEMIA (ICD-272.4) previously controlled Her updated medication list for this problem includes:    Lovastatin 40 Mg Tabs (Lovastatin) .Marland Kitchen... Take 1 tablet by mouth once a day  Labs Reviewed: SGOT: 24 (12/01/2008)   SGPT: 16 (12/01/2008)   HDL:62.70 (12/01/2008), 53.4 (06/02/2008)  LDL:102 (12/01/2008), 106 (06/02/2008)  Chol:183 (12/01/2008), 180 (06/02/2008)  Trig:92.0 (12/01/2008), 105 (06/02/2008)  Complete Medication List: 1)  Hydrochlorothiazide 25 Mg Tabs (Hydrochlorothiazide) .... Take 1 tablet by mouth once a day 2)  Lovastatin 40 Mg Tabs (Lovastatin) .... Take 1 tablet by mouth once a day 3)  Estrace 0.5 Mg Tabs (Estradiol) .... One half by mouth daily 4)  Omega 3 1200 Mg Caps (Omega-3 fatty acids) .... Twice daily 5)  Oxycodone-acetaminophen 5-500 Mg Caps (Oxycodone-acetaminophen) .... Take 1 tablet by mouth three times a day during waking hours 6)  Lexapro 10 Mg Tabs (Escitalopram oxalate) .... One by mouth daily 7)  Qc Womens Daily Multivitamin Tabs (Multiple vitamins-minerals) .... Once daily 8)  Caltrate 600+d Plus 600-400 Mg-unit Chew (Calcium carbonate-vit d-min) .... Two daily 9)  Vitamin D 2000 Unit  Tabs (Cholecalciferol) .... Once daily 10)  Celebrex 200 Mg Caps (Celecoxib) .... Take 1 tablet by mouth once a day 11)  Lutein 6 Mg Caps (Lutein) .... Once daily

## 2010-06-28 NOTE — Assessment & Plan Note (Signed)
Summary: 6 month rov/njr   Vital Signs:  Patient profile:   77 year old female Weight:      128 pounds Temp:     98.2 degrees F oral Pulse rate:   82 / minute Resp:     12 per minute BP sitting:   138 / 70  Vitals Entered By: Lynann Beaver CMA (December 01, 2008 8:53 AM) CC: rov Is Patient Diabetic? No Pain Assessment Patient in pain? no        CC:  rov.  History of Present Illness:  Follow-Up Visit      This is an 77 year old woman who presents for Follow-up visit.  The patient denies chest pain, palpitations, dizziness, syncope, low blood sugar symptoms, high blood sugar symptoms, edema, SOB, DOE, PND, and orthopnea.  Since the last visit the patient notes no new problems or concerns.  The patient reports taking meds as prescribed.  When questioned about possible medication side effects, the patient notes none.   continues to do remarkably well. She deals with back pain consistently but it does not effect her ADLs or exercise habits  Current Problems (verified): 1)  Back Pain  (ICD-724.5) 2)  Hiatal Hernia  (ICD-553.3) 3)  Hypertension  (ICD-401.9) 4)  Hyperlipidemia  (ICD-272.4)  Current Medications (verified): 1)  Misoprostol 200 Mcg Tabs (Misoprostol) .... Two Times A Day 2)  Diclofenac Sodium 75 Mg Tbec (Diclofenac Sodium) .... Two Times A Day 3)  Hydrochlorothiazide 25 Mg Tabs (Hydrochlorothiazide) .... Take 1 Tablet By Mouth Once A Day 4)  Lovastatin 40 Mg Tabs (Lovastatin) .... Take 1 Tablet By Mouth Once A Day 5)  Flexeril 10 Mg Tabs (Cyclobenzaprine Hcl) .... 1/2 At Bedtime 6)  Estrace 0.5 Mg  Tabs (Estradiol) .... One By Mouth Daily 7)  Omega 3 1200 Mg  Caps (Omega-3 Fatty Acids) .... Once Daily 8)  Oxycontin 10 Mg  Tb12 (Oxycodone Hcl) .... Take 1 Tablet By Mouth Two Times A Day 9)  Lexapro 10 Mg Tabs (Escitalopram Oxalate) .... One By Mouth Daily 10)  Qc Womens Daily Multivitamin  Tabs (Multiple Vitamins-Minerals) .... Once Daily 11)  Caltrate 600+d Plus  600-400 Mg-Unit Chew (Calcium Carbonate-Vit D-Min) .... Two Daily 12)  Vitamin D 2000 Unit Tabs (Cholecalciferol) .... Once Daily 13)  Coq10 100 Mg Caps (Coenzyme Q10) .... Once Daily  Allergies (verified): No Known Drug Allergies  Review of Systems       All other systems reviewed and were negative   Physical Exam  General:  Well-developed,well-nourished,in no acute distress; alert,appropriate and cooperative throughout examination Head:  normocephalic and atraumatic.   Eyes:  pupils equal and pupils round.   Ears:  R ear normal and L ear normal.   Neck:  No deformities, masses, or tenderness noted. Chest Wall:  No deformities, masses, or tenderness noted. Lungs:  Normal respiratory effort, chest expands symmetrically. Lungs are clear to auscultation, no crackles or wheezes. Heart:  Normal rate and regular rhythm. S1 and S2 normal without gallop, murmur, click, rub or other extra sounds. Abdomen:  Bowel sounds positive,abdomen soft and non-tender without masses, organomegaly or hernias noted. Msk:  No deformity or scoliosis noted of thoracic or lumbar spine.   Pulses:  R radial normal and L radial normal.   Neurologic:  cranial nerves II-XII intact and gait normal.   Skin:  turgor normal and color normal.   Psych:  normally interactive and good eye contact.     Impression & Recommendations:  Problem # 1:  BACK PAIN (ICD-724.5) refer pain clinic Her updated medication list for this problem includes:    Diclofenac Sodium 75 Mg Tbec (Diclofenac sodium) .Marland Kitchen..Marland Kitchen Two times a day    Flexeril 10 Mg Tabs (Cyclobenzaprine hcl) .Marland Kitchen... 1/2 at bedtime    Oxycontin 10 Mg Tb12 (Oxycodone hcl) .Marland Kitchen... Take 1 tablet by mouth two times a day  Orders: Pain Clinic Referral (Pain)  Problem # 2:  HYPERTENSION (ICD-401.9)  controlled Her updated medication list for this problem includes:    Hydrochlorothiazide 25 Mg Tabs (Hydrochlorothiazide) .Marland Kitchen... Take 1 tablet by mouth once a day  BP today:  138/70 Prior BP: 130/82 (06/25/2008)  Labs Reviewed: K+: 3.5 (06/02/2008) Creat: : 0.8 (06/02/2008)   Chol: 180 (06/02/2008)   HDL: 53.4 (06/02/2008)   LDL: 106 (06/02/2008)   TG: 105 (06/02/2008)  Orders: Venipuncture (81191) TLB-BMP (Basic Metabolic Panel-BMET) (80048-METABOL)  Problem # 3:  HYPERLIPIDEMIA (ICD-272.4)  controlled continue current medications  Her updated medication list for this problem includes:    Lovastatin 40 Mg Tabs (Lovastatin) .Marland Kitchen... Take 1 tablet by mouth once a day  Labs Reviewed: SGOT: 23 (06/02/2008)   SGPT: 16 (06/02/2008)   HDL:53.4 (06/02/2008), 52.0 (06/06/2007)  LDL:106 (06/02/2008), DEL (06/06/2007)  Chol:180 (06/02/2008), 204 (06/06/2007)  Trig:105 (06/02/2008), 150 (06/06/2007)  Orders: TLB-Hepatic/Liver Function Pnl (80076-HEPATIC) TLB-Lipid Panel (80061-LIPID) TLB-TSH (Thyroid Stimulating Hormone) (84443-TSH)  Problem # 4:  DEPRESSIVE DISORDER (ICD-311) increase lexapro Her updated medication list for this problem includes:    Lexapro 10 Mg Tabs (Escitalopram oxalate) ..... One by mouth daily  Complete Medication List: 1)  Misoprostol 200 Mcg Tabs (Misoprostol) .... Two times a day 2)  Diclofenac Sodium 75 Mg Tbec (Diclofenac sodium) .... Two times a day 3)  Hydrochlorothiazide 25 Mg Tabs (Hydrochlorothiazide) .... Take 1 tablet by mouth once a day 4)  Lovastatin 40 Mg Tabs (Lovastatin) .... Take 1 tablet by mouth once a day 5)  Flexeril 10 Mg Tabs (Cyclobenzaprine hcl) .... 1/2 at bedtime 6)  Estrace 0.5 Mg Tabs (Estradiol) .... One by mouth daily 7)  Omega 3 1200 Mg Caps (Omega-3 fatty acids) .... Once daily 8)  Oxycontin 10 Mg Tb12 (Oxycodone hcl) .... Take 1 tablet by mouth two times a day 9)  Lexapro 10 Mg Tabs (Escitalopram oxalate) .... One by mouth daily 10)  Qc Womens Daily Multivitamin Tabs (Multiple vitamins-minerals) .... Once daily 11)  Caltrate 600+d Plus 600-400 Mg-unit Chew (Calcium carbonate-vit d-min) .... Two  daily 12)  Vitamin D 2000 Unit Tabs (Cholecalciferol) .... Once daily 13)  Coq10 100 Mg Caps (Coenzyme q10) .... Once daily  Patient Instructions: 1)  Please schedule a follow-up appointment in 3 months.  Appended Document: 6 month rov/njr discussed code status reviewed POA her intent is that initial emergent care would be appropriate, prolonged intensive care without the possiblity of renewing her vigorous lifestyle would not be appropriate

## 2010-06-28 NOTE — Consult Note (Signed)
Summary: Skin Surgery Squamous Cell Carcinome  Skin Surgery Squamous Cell Carcinome   Imported By: Felipa Evener 09/30/2007 11:23:33  _____________________________________________________________________  External Attachment:    Type:   Image     Comment:   skin surgery

## 2010-06-28 NOTE — Miscellaneous (Signed)
Summary: BONE DENSITY  Clinical Lists Changes  Orders: Added new Test order of T-Bone Densitometry (77080) - Signed Added new Test order of T-Lumbar Vertebral Assessment (77082) - Signed 

## 2010-06-28 NOTE — Consult Note (Signed)
Summary: [facility]  [facility]   Imported By: Maryln Gottron 08/10/2009 11:08:05  _____________________________________________________________________  External Attachment:    Type:   Image     Comment:   External Document

## 2010-06-30 ENCOUNTER — Ambulatory Visit (HOSPITAL_BASED_OUTPATIENT_CLINIC_OR_DEPARTMENT_OTHER): Payer: MEDICARE | Admitting: Physical Medicine & Rehabilitation

## 2010-06-30 ENCOUNTER — Ambulatory Visit: Payer: Medicare Other | Attending: Physical Medicine & Rehabilitation

## 2010-06-30 DIAGNOSIS — R32 Unspecified urinary incontinence: Secondary | ICD-10-CM | POA: Insufficient documentation

## 2010-06-30 DIAGNOSIS — F329 Major depressive disorder, single episode, unspecified: Secondary | ICD-10-CM

## 2010-06-30 DIAGNOSIS — M961 Postlaminectomy syndrome, not elsewhere classified: Secondary | ICD-10-CM

## 2010-06-30 DIAGNOSIS — Z79899 Other long term (current) drug therapy: Secondary | ICD-10-CM | POA: Insufficient documentation

## 2010-06-30 DIAGNOSIS — G8929 Other chronic pain: Secondary | ICD-10-CM | POA: Insufficient documentation

## 2010-06-30 DIAGNOSIS — M1990 Unspecified osteoarthritis, unspecified site: Secondary | ICD-10-CM | POA: Insufficient documentation

## 2010-07-06 NOTE — Assessment & Plan Note (Signed)
Summary: 6 MONTH ROV/NJR/pt rescd from bump//ccm   Vital Signs:  Patient profile:   77 year old female Weight:      129 pounds Temp:     98.3 degrees F oral Pulse rate:   88 / minute Pulse rhythm:   regular BP sitting:   122 / 74  (left arm) Cuff size:   regular  Vitals Entered By: Alfred Levins, CMA (June 23, 2010 10:08 AM) CC: f/u   CC:  f/u.  History of Present Illness:  Follow-Up Visit      This is a 77 year old woman who presents for Follow-up visit.  The patient denies chest pain and palpitations.  Since the last visit the patient notes no new problems or concerns and being seen by a specialist (has seen chiropractor and she thinks is helping).  The patient reports taking meds as prescribed.  When questioned about possible medication side effects, the patient notes none.    All other systems reviewed and were negative   Current Medications (verified): 1)  Hydrochlorothiazide 25 Mg Tabs (Hydrochlorothiazide) .... Take 1 Tablet By Mouth Once A Day 2)  Lovastatin 40 Mg Tabs (Lovastatin) .... Take 1 Tablet By Mouth Once A Day 3)  Estrace 0.5 Mg  Tabs (Estradiol) .... One Half By Mouth Daily 4)  Krill Oil 1000 Mg Caps (Krill Oil) .... Once Daily 5)  Oxycodone-Acetaminophen 5-500 Mg Caps (Oxycodone-Acetaminophen) .... Take 1/2 Tablet By Mouth Every 4 Hours As Needed 6)  Lexapro 10 Mg Tabs (Escitalopram Oxalate) .... One By Mouth Daily 7)  Qc Womens Daily Multivitamin  Tabs (Multiple Vitamins-Minerals) .... Once Daily 8)  Caltrate 600+d Plus 600-400 Mg-Unit Chew (Calcium Carbonate-Vit D-Min) .... Two Daily 9)  Vitamin D 2000 Unit Tabs (Cholecalciferol) .... Once Daily 10)  Lutein 20 Mg Caps (Lutein) .... Once Daily 11)  Cyclobenzaprine Hcl 10 Mg Tabs (Cyclobenzaprine Hcl) .... Take One Half By Mouth At Bedtime 12)  Aspirin 81 Mg Tabs (Aspirin) .... Every Other Day  Allergies (verified): No Known Drug Allergies  Past History:  Past Medical History: Last  updated: 02/25/2008 Hyperlipidemia Hypertension hiatal hernia   Depression  Past Surgical History: Last updated: 12/17/2006 Hysterectomy, TAH   1989 Oophorectomy, bilateral 1989 Thyroidectomy  1968 rotator cuff surgery  2001 laminectomy with fusion  2000 laminectomy with fusion  2000 basal cell ca lip  2001 cardiac cath--no occlusions  1997  Social History: Last updated: 03/08/2007 Single Former Smoker Regular exercise-yes  Risk Factors: Exercise: yes (03/08/2007)  Risk Factors: Smoking Status: quit > 6 months (12/16/2009)  Physical Exam  General:  alert and well-developed.   Head:  normocephalic and atraumatic.   Eyes:  pupils equal and pupils round.   Ears:  R ear normal and L ear normal.   Neck:  No deformities, masses, or tenderness noted. Lungs:  normal respiratory effort and no intercostal retractions.   Skin:  turgor normal and color normal.   Psych:  normally interactive and good eye contact.     Impression & Recommendations:  Problem # 1:  HYPERTENSION (ICD-401.9)  controlled continue current medications  Her updated medication list for this problem includes:    Hydrochlorothiazide 25 Mg Tabs (Hydrochlorothiazide) .Marland Kitchen... Take 1 tablet by mouth once a day  BP today: 122/74 Prior BP: 118/66 (12/16/2009)  Labs Reviewed: K+: 3.8 (12/16/2009) Creat: : 0.7 (12/16/2009)   Chol: 174 (12/16/2009)   HDL: 54.40 (12/16/2009)   LDL: 98 (12/16/2009)   TG: 108.0 (12/16/2009)  Orders:  Venipuncture (16109) Specimen Handling (60454) TLB-BMP (Basic Metabolic Panel-BMET) (80048-METABOL)  Problem # 2:  BACK PAIN (ICD-724.5) Assessment: Unchanged  sees pain clinic Her updated medication list for this problem includes:    Oxycodone-acetaminophen 5-500 Mg Caps (Oxycodone-acetaminophen) .Marland Kitchen... Take 1/2 tablet by mouth every 4 hours as needed    Cyclobenzaprine Hcl 10 Mg Tabs (Cyclobenzaprine hcl) .Marland Kitchen... Take one half by mouth at bedtime    Aspirin 81 Mg Tabs  (Aspirin) ..... Every other day  Problem # 3:  HYPERLIPIDEMIA (ICD-272.4)  controlled continue current medications  Her updated medication list for this problem includes:    Lovastatin 40 Mg Tabs (Lovastatin) .Marland Kitchen... Take 1 tablet by mouth once a day  Labs Reviewed: SGOT: 20 (12/16/2009)   SGPT: 14 (12/16/2009)   HDL:54.40 (12/16/2009), 62.70 (12/01/2008)  LDL:98 (12/16/2009), 102 (09/81/1914)  Chol:174 (12/16/2009), 183 (12/01/2008)  Trig:108.0 (12/16/2009), 92.0 (12/01/2008)  Orders: Specimen Handling (78295) TLB-Hepatic/Liver Function Pnl (80076-HEPATIC) TLB-Lipid Panel (80061-LIPID)  Problem # 4:  KNEE PAIN (ICD-719.46) Assessment: Unchanged doing reasonably well Her updated medication list for this problem includes:    Oxycodone-acetaminophen 5-500 Mg Caps (Oxycodone-acetaminophen) .Marland Kitchen... Take 1/2 tablet by mouth every 4 hours as needed    Cyclobenzaprine Hcl 10 Mg Tabs (Cyclobenzaprine hcl) .Marland Kitchen... Take one half by mouth at bedtime    Aspirin 81 Mg Tabs (Aspirin) ..... Every other day  Complete Medication List: 1)  Hydrochlorothiazide 25 Mg Tabs (Hydrochlorothiazide) .... Take 1 tablet by mouth once a day 2)  Lovastatin 40 Mg Tabs (Lovastatin) .... Take 1 tablet by mouth once a day 3)  Estrace 0.5 Mg Tabs (Estradiol) .... One half by mouth daily 4)  Krill Oil 1000 Mg Caps (Krill oil) .... Once daily 5)  Oxycodone-acetaminophen 5-500 Mg Caps (Oxycodone-acetaminophen) .... Take 1/2 tablet by mouth every 4 hours as needed 6)  Lexapro 10 Mg Tabs (Escitalopram oxalate) .... One by mouth daily 7)  Qc Womens Daily Multivitamin Tabs (Multiple vitamins-minerals) .... Once daily 8)  Caltrate 600+d Plus 600-400 Mg-unit Chew (Calcium carbonate-vit d-min) .... Two daily 9)  Vitamin D 2000 Unit Tabs (Cholecalciferol) .... Once daily 10)  Lutein 20 Mg Caps (Lutein) .... Once daily 11)  Cyclobenzaprine Hcl 10 Mg Tabs (Cyclobenzaprine hcl) .... Take one half by mouth at bedtime 12)  Aspirin 81  Mg Tabs (Aspirin) .... Every other day  Patient Instructions: 1)  Please schedule a follow-up appointment in 6 months.   Orders Added: 1)  Est. Patient Level IV [62130] 2)  Venipuncture [86578] 3)  Specimen Handling [99000] 4)  TLB-BMP (Basic Metabolic Panel-BMET) [80048-METABOL] 5)  TLB-Hepatic/Liver Function Pnl [80076-HEPATIC] 6)  TLB-Lipid Panel [80061-LIPID]   Immunization History:  Influenza Immunization History:    Influenza:  fluvax 3+ (03/29/2010)   Immunization History:  Influenza Immunization History:    Influenza:  Fluvax 3+ (03/29/2010)

## 2010-08-01 ENCOUNTER — Ambulatory Visit: Payer: MEDICARE | Admitting: Physical Medicine & Rehabilitation

## 2010-08-01 ENCOUNTER — Encounter: Payer: Medicare Other | Attending: Physical Medicine & Rehabilitation

## 2010-08-05 ENCOUNTER — Inpatient Hospital Stay (HOSPITAL_COMMUNITY)
Admission: AD | Admit: 2010-08-05 | Discharge: 2010-08-06 | DRG: 378 | Disposition: A | Discharge status: Home / Self Care | Payer: Medicare Other | Source: Ambulatory Visit | Attending: Family Medicine | Admitting: Family Medicine

## 2010-08-05 ENCOUNTER — Encounter: Payer: Self-pay | Admitting: Internal Medicine

## 2010-08-05 ENCOUNTER — Ambulatory Visit (INDEPENDENT_AMBULATORY_CARE_PROVIDER_SITE_OTHER): Payer: MEDICARE | Admitting: Internal Medicine

## 2010-08-05 DIAGNOSIS — I10 Essential (primary) hypertension: Secondary | ICD-10-CM | POA: Diagnosis present

## 2010-08-05 DIAGNOSIS — Z87891 Personal history of nicotine dependence: Secondary | ICD-10-CM

## 2010-08-05 DIAGNOSIS — M129 Arthropathy, unspecified: Secondary | ICD-10-CM | POA: Diagnosis present

## 2010-08-05 DIAGNOSIS — K254 Chronic or unspecified gastric ulcer with hemorrhage: Principal | ICD-10-CM | POA: Diagnosis present

## 2010-08-05 DIAGNOSIS — M549 Dorsalgia, unspecified: Secondary | ICD-10-CM

## 2010-08-05 DIAGNOSIS — D62 Acute posthemorrhagic anemia: Secondary | ICD-10-CM | POA: Diagnosis present

## 2010-08-05 DIAGNOSIS — E785 Hyperlipidemia, unspecified: Secondary | ICD-10-CM | POA: Diagnosis present

## 2010-08-05 DIAGNOSIS — E876 Hypokalemia: Secondary | ICD-10-CM | POA: Diagnosis present

## 2010-08-05 LAB — CBC
HCT: 25.4 % — ABNORMAL LOW (ref 36.0–46.0)
Hemoglobin: 8.5 g/dL — ABNORMAL LOW (ref 12.0–15.0)
MCH: 29.5 pg (ref 26.0–34.0)
MCHC: 33.5 g/dL (ref 30.0–36.0)
MCV: 88.2 fL (ref 78.0–100.0)
Platelets: 163 10*3/uL (ref 150–400)
RBC: 2.88 MIL/uL — ABNORMAL LOW (ref 3.87–5.11)
RDW: 14 % (ref 11.5–15.5)
WBC: 10.9 10*3/uL — ABNORMAL HIGH (ref 4.0–10.5)

## 2010-08-05 LAB — COMPREHENSIVE METABOLIC PANEL
ALT: 14 U/L (ref 0–35)
AST: 20 U/L (ref 0–37)
Albumin: 3.9 g/dL (ref 3.5–5.2)
Alkaline Phosphatase: 44 U/L (ref 39–117)
BUN: 50 mg/dL — ABNORMAL HIGH (ref 6–23)
CO2: 30 mEq/L (ref 19–32)
Calcium: 9.4 mg/dL (ref 8.4–10.5)
Chloride: 103 mEq/L (ref 96–112)
Creatinine, Ser: 0.7 mg/dL (ref 0.4–1.2)
GFR calc Af Amer: >60 mL/min (ref >60)
GFR calc non Af Amer: >60 mL/min (ref >60)
Glucose, Bld: 109 mg/dL — ABNORMAL HIGH (ref 70–99)
Potassium: 3.6 mEq/L (ref 3.5–5.1)
Sodium: 139 mEq/L (ref 135–145)
Total Bilirubin: 0.5 mg/dL (ref 0.3–1.2)
Total Protein: 6 g/dL (ref 6.0–8.3)

## 2010-08-05 LAB — ABO/RH: ABO/RH(D): O POS

## 2010-08-05 LAB — PROTIME-INR
INR: 1.05 (ref 0.00–1.49)
Prothrombin Time: 13.9 seconds (ref 11.6–15.2)

## 2010-08-05 LAB — APTT: aPTT: 35 seconds (ref 24–37)

## 2010-08-05 NOTE — Patient Instructions (Signed)
Report to [facility] now for further evaluation and treatment

## 2010-08-05 NOTE — Progress Notes (Signed)
Subjective:    Patient ID: Vanessa Mason, female    DOB: 07/24/1917, 77 y.o.   MRN: [id number]  Back Pain Pertinent negatives include no abdominal pain, chest pain, dysuria, headaches, numbness, pelvic pain or weakness.    77 year old patient who has a history of chronic back pain. She was hospitalized approximately 30 years ago for NSAID  associated peptic ulcer disease.   Approximately one month ago on Celebrex was discontinued and she has been using when necessary naproxen in addition to her other analgesics. For the past 2 days she has noted black melanotic stool. There's been some vague epigastric discomfort worsening low back pain. She also describes some weakness and lightheadedness. Examination in the office confirmed black hematest positive melanotic stool and she is now admitted for further evaluation and treatment.  Past Medical History  Diagnosis Date  . BACK PAIN 03/08/2007  . DEPRESSIVE DISORDER 12/01/2008  . HIATAL HERNIA 12/17/2006  . HYPERLIPIDEMIA 03/09/2006  . HYPERTENSION 03/09/2006  . KNEE PAIN 05/06/2009   Past Surgical History  Procedure Date  . Abdominal hysterectomy   . Oophorectomy   . Thyroidectomy   . Rotator cuff repair   . Laminectomy     with fusion  . Cardiac catheterization     reports that she has never smoked. She does not have any smokeless tobacco history on file. Her alcohol and drug histories not on file. family history is not on file. Not on File    Review of Systems  Constitutional: Positive for fatigue.  HENT: Negative for hearing loss, congestion, sore throat, rhinorrhea, dental problem, sinus pressure and tinnitus.   Eyes: Negative for pain, discharge and visual disturbance.  Respiratory: Negative for cough and shortness of breath.   Cardiovascular: Negative for chest pain, palpitations and leg swelling.  Gastrointestinal: Negative for nausea, vomiting, abdominal pain, diarrhea, constipation, blood in stool and abdominal  distention.  Genitourinary: Negative for dysuria, urgency, frequency, hematuria, flank pain, vaginal bleeding, vaginal discharge, difficulty urinating, vaginal pain and pelvic pain.  Musculoskeletal: Positive for back pain. Negative for joint swelling, arthralgias and gait problem.  Skin: Negative for rash.  Neurological: Negative for dizziness, syncope, speech difficulty, weakness, numbness and headaches.  Hematological: Negative for adenopathy.  Psychiatric/Behavioral: Negative for behavioral problems, dysphoric mood and agitation. The patient is not nervous/anxious.        Objective:   Physical Exam  Constitutional: She is oriented to person, place, and time. She appears well-developed and well-nourished.  HENT:  Head: Normocephalic.  Right Ear: External ear normal.  Left Ear: External ear normal.  Mouth/Throat: Oropharynx is clear and moist.  Eyes: Conjunctivae and EOM are normal. Pupils are equal, round, and reactive to light.  Neck: Normal range of motion. Neck supple. No thyromegaly present.  Cardiovascular: Normal rate, regular rhythm, normal heart sounds and intact distal pulses.   Pulmonary/Chest: Effort normal and breath sounds normal.  Abdominal: Soft. Bowel sounds are normal. She exhibits no mass. There is tenderness.        Lower midline scar. Very mild epigastric discomfort  Genitourinary: Guaiac positive stool.        Melanotic heme positive stool  Musculoskeletal: Normal range of motion.  Lymphadenopathy:    She has no cervical adenopathy.  Neurological: She is alert and oriented to person, place, and time.  Skin: Skin is warm and dry. No rash noted.  Psychiatric: She has a normal mood and affect. Her behavior is normal.          Assessment &  Plan:   melena  History of anti-inflammatory drug use associated peptic ulcer disease  Chronic pain syndrome  History of hypertension   Discussed with the hospitalist service will admit to [facility] team 3 for  further evaluation and treatment of her upper GI bleeding. She has seen Dr. Matthias Hughs  in the past-  We'll make n.p.o. After midnight and consider upper endoscopy in the morning. Serial H&H's will be monitored

## 2010-08-06 LAB — CBC
HCT: 21.7 % — ABNORMAL LOW (ref 36.0–46.0)
Hemoglobin: 7.1 g/dL — ABNORMAL LOW (ref 12.0–15.0)
MCH: 29 pg (ref 26.0–34.0)
MCHC: 32.7 g/dL (ref 30.0–36.0)
MCV: 88.6 fL (ref 78.0–100.0)
Platelets: 134 10*3/uL — ABNORMAL LOW (ref 150–400)
RBC: 2.45 MIL/uL — ABNORMAL LOW (ref 3.87–5.11)
RDW: 14.5 % (ref 11.5–15.5)
WBC: 6.4 10*3/uL (ref 4.0–10.5)

## 2010-08-06 LAB — BASIC METABOLIC PANEL
BUN: 34 mg/dL — ABNORMAL HIGH (ref 6–23)
CO2: 30 mEq/L (ref 19–32)
Calcium: 8.5 mg/dL (ref 8.4–10.5)
Chloride: 110 mEq/L (ref 96–112)
Creatinine, Ser: 0.78 mg/dL (ref 0.4–1.2)
GFR calc Af Amer: >60 mL/min (ref >60)
GFR calc non Af Amer: >60 mL/min (ref >60)
Glucose, Bld: 100 mg/dL — ABNORMAL HIGH (ref 70–99)
Potassium: 3.2 mEq/L — ABNORMAL LOW (ref 3.5–5.1)
Sodium: 143 mEq/L (ref 135–145)

## 2010-08-06 LAB — MAGNESIUM: Magnesium: 2 mg/dL (ref 1.5–2.5)

## 2010-08-06 LAB — HEMOGLOBIN AND HEMATOCRIT, BLOOD
HCT: 30.7 % — ABNORMAL LOW (ref 36.0–46.0)
Hemoglobin: 10.2 g/dL — ABNORMAL LOW (ref 12.0–15.0)

## 2010-08-06 LAB — CLOTEST (H. PYLORI), BIOPSY: Helicobacter screen: NEGATIVE

## 2010-08-07 LAB — TYPE AND SCREEN
ABO/RH(D): O POS
Antibody Screen: NEGATIVE
Unit division: 0
Unit division: 0

## 2010-08-11 ENCOUNTER — Ambulatory Visit: Payer: Medicare Other | Admitting: Physical Medicine & Rehabilitation

## 2010-08-11 NOTE — H&P (Signed)
NAME:  Vanessa Mason, Vanessa Mason          ACCOUNT NO.:  [id number]  MEDICAL RECORD NO.:  [id number]           PATIENT TYPE:  I  LOCATION:  5508                         FACILITY:  [facility]  PHYSICIAN:  Pleas Koch, MD        DATE OF BIRTH:  July 21, 1917  DATE OF ADMISSION:  08/05/2010 DATE OF DISCHARGE:                             HISTORY & PHYSICAL   PRIMARY CARE PHYSICIAN:  Dr. Evelina Bucy.  PAIN PHYSICIAN:  Dr. Felicity Coyer  CARDIOLOGIST:  Madolyn Frieze. Jens Som, MD, [facility].  GASTROENTEROLOGIST:  Bernette Redbird, M.D.  CHIEF COMPLAINT:  Black and tarry stools x4-5 days.  HISTORY OF PRESENT ILLNESS:  This is a very pleasant 77 year old female went to primary care physician's office Dr. Amador Cunas complaining of seeing dark stool for the past 4-5 days.  She stated she was diagnosed approximately 30 years ago with NSAID-associate peptic ulcer disease and was on Celebrex which has been discontinued and has been taking p.r.n. Naprosyn in addition to some other analgesics.  She has had some vague epigastric tenderness as well and some mild nausea, but no vomiting, no bright red blood in her vomit.  No chest pain or shortness of breath. No dysuria or other bleeding.  No blurred vision or double vision.  No weakness.  She is seen for back pain status post laminectomy, status post laminectomy syndrome multilevel fusion, but she states has not been taking any specific analgesics recently.  Vitals when seen at primary care physician, blood pressure 110/80.  No recorded pulse rate.  Here on the floor, her pulse rate is in the low 110s to 120s.  She does not have any specific cardiac history.  PAST MEDICAL HISTORY:  Significant for hypertension.  She used to take Norvasc in the past, but now takes hydrochlorothiazide 25 mg.  She has no history of heart disease and took herself off her lovastatin for cholesterol.  PAST SURGICAL HISTORY:  She has had multiple surgeries in the past, most significant  of which is her back surgery and lumbar laminectomy L4 through L6 fusion.  MEDICATIONS: 1. Oxycodone 5/325 half tablet q.4 p.r.n. 2. Hydrochlorothiazide 25 mg daily.  She used to take diclofenac and Celebrex in the past as well as estrogen replacement.  ALLERGIES:  She has no known drug allergies.  SOCIAL HISTORY:  She smoked about 55 years ago one-pack per day for 18 years.  She usually also had an "5 o'clock Special" and a drink.  She never worked outside of the home and raised five kids, one of them which is sitting with her now.  PHYSICAL EXAMINATION:  GENERAL:  The patient is a very pleasant elderly Caucasian female, in no significant distress. VITAL SIGNS:  Heart rate is in 120.  I do not have any other vitals at present. HEENT:  She has mild pallor.  No icterus.  Very good dentition. NECK:  No carotid bruit.  No JVD, distention. HEART:  S1 and S2, mild tachycardia.  No murmurs, rubs, or gallops.  No murmur. CHEST:  Clinically clear. ABDOMEN:  Soft, nontender.  No rebound.  No guarding. RECTAL:  Deferred.  IMPRESSION/ASSESSMENT:  This is a 77 year old lady  with likely upper gastrointestinal bleed.  I have already spoken with Dr. Matthias Hughs of [facility] who graciously agreed to see the patient.  He will even probably GI scope for tonight, query upper versus lower scope.  She will be kept on Protonix 80 mg IV stat and then 8 mg per hour as a drip.  I will get a stat CBC, CMET, INR and review her.  She will be kept n.p.o. for possible procedure and I will make her aware of the same.  She will be kept off her hydrochlorothiazide for her hypertension at the present time.  She states that she wants to be a full code for the time being.  Her next of kin who is here is Designer, jewellery at phone number [phone].          ______________________________ Pleas Koch, MD     JS/MEDQ  D:  08/05/2010  T:  08/05/2010  Job:  [id number]  Electronically Signed by Pleas Koch  MD on 08/11/2010 04:47:43 PM

## 2010-08-12 NOTE — Discharge Summary (Signed)
NAME:  Vanessa Mason, Vanessa Mason          ACCOUNT NO.:  [id number]  MEDICAL RECORD NO.:  [id number]           PATIENT TYPE:  I  LOCATION:  4740                         FACILITY:  [facility]  PHYSICIAN:  AVA SWAYZE, DO         DATE OF BIRTH:  1918/02/08  DATE OF ADMISSION:  08/05/2010 DATE OF DISCHARGE:  08/06/2010                              DISCHARGE SUMMARY   ADMISSION DIAGNOSES:  Acute blood loss anemia, gastrointestinal bleed, chronic arthritis pain, hypertension, and hyperlipidemia.  HISTORY OF PRESENT ILLNESS:  Please see H and P.  HOSPITAL COURSE:  The patient was admitted.  The patient was placed on a Protonix drip.  Dr. Matthias Hughs was consulted for GI.  The patient underwent an EGD this morning that demonstrated 2 small antral ulcers that were not actively bleeding.  They appeared quiescent.  Her hemoglobin this morning was 7.1 with hematocrit of 21.7.  She was transfused with 6 units of packed RBCs.  Her hemoglobin came up to 10.2/30.7.  She also was hypokalemic and her potassium was supplemented.  This afternoon, the patient is feeling very well.  She wants very badly to go home.  I have discussed the patient with her gastroenterologist and he states he is comfortable with her going home.  I have discussed with the patient at length the role that her pain control medications play and causing her GI bleed.  She has been using NSAIDS albeit sparingly to treat her arthritis pain and this is likely the cause of her GI bleed.  I have urged her to use arthritis strength Tylenol for her pain and then to use a separate prescription of oxycodone as needed to treat severe pain. She does not like taking Percocet all the time, which is the medication she had been prescribed previously because they caused her to feel slow, sleepy, and lethargic, but perhaps she can use this medication only when her pain is severe or at bedtime and get relief from the severe pain and then use Tylenol round the  clock for her every day pain and if get better pain control and ovoid irritation of her gastric mucosa.  DISCHARGE DIAGNOSES: 1. Acute blood loss anemia which is repleted after transfusion. 2. Antral gastric ulcers causing gastrointestinal bleed. 3. Hypokalemia. 4. Hypertension. 5. Hyperlipidemia, the patient took herself off lovastatin.  DISCHARGE INSTRUCTIONS:  Activity as tolerated.  Diet is cardiac, bland.  MEDICATIONS: 1. Protonix 40 mg 1 p.o. b.i.d. 2. Tylenol Arthritis Pain 1-2 p.o. q.8 h. p.r.n. pain. 3. Oxycodone 5 mg 1 p.o. q.6 h. p.r.n. pain. 4. Over-the-counter calcium 1 tablet p.o. b.i.d. 5. Hydrochlorothiazide 12.5 mg 1 p.o. daily. 6. Lutein 6 mg 1 p.o. q.a.m. 7. Omega-3 fatty acid tablet 1 p.o. b.i.d. 8. Vitamin D 2000 units 1 tablet by mouth daily.  The patient is to stop the oxycodone and acetaminophen combination and Percocet.  She is to follow up with her primary care doctor, Dr. Timoteo Gaul or Dr. Amador Cunas in 2-3 weeks.  She is to have a CBC at that time.  The patient is to avoid any and all NSAIDS.  She is to follow up Dr. Matthias Hughs  in 1 month.  If the patient feels fatigued or notices tarry stools again, she is to get in touch with her primary care doctor immediately and let him know what is going on.  I spent 42 minutes on this discharge.          ______________________________ Fran Lowes, DO     AS/MEDQ  D:  08/06/2010  T:  08/07/2010  Job:  [id number]  cc:   Valetta Mole. Swords, MD Gordy Savers, MD Bernette Redbird, M.D.  Electronically Signed by Fran Lowes DO on 08/12/2010 04:55:18 PM

## 2010-08-25 ENCOUNTER — Other Ambulatory Visit: Payer: Self-pay | Admitting: Internal Medicine

## 2010-08-25 DIAGNOSIS — Z1231 Encounter for screening mammogram for malignant neoplasm of breast: Secondary | ICD-10-CM

## 2010-09-06 ENCOUNTER — Ambulatory Visit
Admission: RE | Admit: 2010-09-06 | Discharge: 2010-09-06 | Disposition: A | Discharge status: Home / Self Care | Payer: Medicare Other | Source: Ambulatory Visit | Attending: Internal Medicine | Admitting: Internal Medicine

## 2010-09-06 DIAGNOSIS — Z1231 Encounter for screening mammogram for malignant neoplasm of breast: Secondary | ICD-10-CM

## 2010-09-09 ENCOUNTER — Other Ambulatory Visit: Payer: Self-pay | Admitting: Internal Medicine

## 2010-09-12 ENCOUNTER — Encounter: Payer: Self-pay | Admitting: Internal Medicine

## 2010-09-12 ENCOUNTER — Ambulatory Visit (INDEPENDENT_AMBULATORY_CARE_PROVIDER_SITE_OTHER): Payer: Medicare Other | Admitting: Internal Medicine

## 2010-09-12 DIAGNOSIS — K922 Gastrointestinal hemorrhage, unspecified: Secondary | ICD-10-CM

## 2010-09-12 DIAGNOSIS — Z13220 Encounter for screening for lipoid disorders: Secondary | ICD-10-CM

## 2010-09-12 DIAGNOSIS — I10 Essential (primary) hypertension: Secondary | ICD-10-CM

## 2010-09-12 DIAGNOSIS — E785 Hyperlipidemia, unspecified: Secondary | ICD-10-CM

## 2010-09-12 HISTORY — DX: Gastrointestinal hemorrhage, unspecified: K92.2

## 2010-09-12 LAB — CBC WITH DIFFERENTIAL/PLATELET
Basophils Absolute: 0 10*3/uL (ref 0.0–0.1)
Basophils Relative: 0.3 % (ref 0.0–3.0)
Eosinophils Absolute: 0.1 10*3/uL (ref 0.0–0.7)
Eosinophils Relative: 2.1 % (ref 0.0–5.0)
HCT: 37.6 % (ref 36.0–46.0)
Hemoglobin: 12.8 g/dL (ref 12.0–15.0)
Lymphocytes Relative: 31.7 % (ref 12.0–46.0)
Lymphs Abs: 2.1 10*3/uL (ref 0.7–4.0)
MCHC: 34 g/dL (ref 30.0–36.0)
MCV: 89.7 fl (ref 78.0–100.0)
Monocytes Absolute: 0.4 10*3/uL (ref 0.1–1.0)
Monocytes Relative: 6.5 % (ref 3.0–12.0)
Neutro Abs: 3.9 10*3/uL (ref 1.4–7.7)
Neutrophils Relative %: 59.4 % (ref 43.0–77.0)
Platelets: 197 10*3/uL (ref 150.0–400.0)
RBC: 4.19 Mil/uL (ref 3.87–5.11)
RDW: 14.8 % — ABNORMAL HIGH (ref 11.5–14.6)
WBC: 6.6 10*3/uL (ref 4.5–10.5)

## 2010-09-12 LAB — BASIC METABOLIC PANEL
BUN: 22 mg/dL (ref 6–23)
CO2: 33 mEq/L — ABNORMAL HIGH (ref 19–32)
Calcium: 9.8 mg/dL (ref 8.4–10.5)
Chloride: 100 mEq/L (ref 96–112)
Creatinine, Ser: 0.7 mg/dL (ref 0.4–1.2)
GFR: 78.18 mL/min (ref >60.00)
Glucose, Bld: 117 mg/dL — ABNORMAL HIGH (ref 70–99)
Potassium: 3.9 mEq/L (ref 3.5–5.1)
Sodium: 141 mEq/L (ref 135–145)

## 2010-09-12 LAB — HEPATIC FUNCTION PANEL
ALT: 20 U/L (ref 0–35)
AST: 23 U/L (ref 0–37)
Albumin: 3.9 g/dL (ref 3.5–5.2)
Alkaline Phosphatase: 52 U/L (ref 39–117)
Bilirubin, Direct: 0.1 mg/dL (ref 0.0–0.3)
Total Bilirubin: 1.1 mg/dL (ref 0.3–1.2)
Total Protein: 6.4 g/dL (ref 6.0–8.3)

## 2010-09-12 LAB — LIPID PANEL
Cholesterol: 315 mg/dL — ABNORMAL HIGH (ref 0–200)
HDL: 57.9 mg/dL (ref >39.00)
Total CHOL/HDL Ratio: 5
Triglycerides: 197 mg/dL — ABNORMAL HIGH (ref 0.0–149.0)
VLDL: 39.4 mg/dL (ref 0.0–40.0)

## 2010-09-12 LAB — LDL CHOLESTEROL, DIRECT: Direct LDL: 218.7 mg/dL

## 2010-09-12 NOTE — Progress Notes (Signed)
  Subjective:    Patient ID: Vanessa Mason, female    DOB: 08/25/1917, 77 y.o.   MRN: [id number]  HPI  Patient is here for post hospital followup. I reviewed hospital notes and Dr. Matthias Hughs in his endoscopy report. Patient had 2 small gastric ulcers. She had significant GI bleeding. Hemoglobin down into the 7 range. As far as I can tell she was transfused with 2 units of packed red blood cells. She's feeling significantly better. It was thought that the GI bleed was caused by naproxen. That has been discontinued as well as aspirin. She has had no recurrent bleeding. She is feeling well. I have reviewed hospital laboratory work.  Past Medical History  Diagnosis Date  . BACK PAIN 03/08/2007  . DEPRESSIVE DISORDER 12/01/2008  . HIATAL HERNIA 12/17/2006  . HYPERLIPIDEMIA 03/09/2006  . HYPERTENSION 03/09/2006  . KNEE PAIN 05/06/2009   Past Surgical History  Procedure Date  . Abdominal hysterectomy   . Oophorectomy   . Thyroidectomy   . Rotator cuff repair   . Laminectomy     with fusion  . Cardiac catheterization     reports that she has never smoked. She does not have any smokeless tobacco history on file. Her alcohol and drug histories not on file. family history is not on file. Allergies  Allergen Reactions  . Naproxen     Gi bleed     Review of Systems    patient denies chest pain, shortness of breath, orthopnea. Denies lower extremity edema, abdominal pain, change in appetite, change in bowel movements. Patient denies rashes, musculoskeletal complaints. No other specific complaints in a complete review of systems.    Current Outpatient Prescriptions  Medication Sig Dispense Refill  . acetaminophen (TYLENOL) 650 MG CR tablet Take 650 mg by mouth 3 (three) times daily.        . Calcium Carbonate-Vitamin D (CALTRATE 600+D) 600-400 MG-UNIT per tablet Take 1 tablet by mouth daily.        . Cholecalciferol (VITAMIN D) 2000 UNITS CAPS Take by mouth.        . cyclobenzaprine  (FLEXERIL) 10 MG tablet TAKE ONE HALF BY MOUTH AT BEDTIME  15 tablet  1  . hydrochlorothiazide 25 MG tablet TAKE ONE TABLET BY MOUTH ONE TIME DAILY  30 tablet  5  . KRILL OIL 1000 MG CAPS Take by mouth.        . Lutein 20 MG CAPS Take by mouth daily.        . Multiple Vitamin (MULTIVITAMIN) tablet Take 1 tablet by mouth daily.        . pantoprazole (PROTONIX) 40 MG tablet 1 tablet daily.      Marland Kitchen DISCONTD: aspirin 81 MG tablet Take 81 mg by mouth daily.        Marland Kitchen DISCONTD: estradiol (ESTRACE) 0.5 MG tablet Take 0.5 mg by mouth daily.        Marland Kitchen DISCONTD: lovastatin (MEVACOR) 40 MG tablet Take 40 mg by mouth daily.        Marland Kitchen DISCONTD: oxycodone-acetaminophen (ROXICET) 5-500 MG per tablet Take by mouth. 1/2 tablet every 4 hours prn pain          Objective:   Physical Exam        Assessment & Plan:

## 2010-09-12 NOTE — Assessment & Plan Note (Signed)
No recurrent symptoms. I have reviewed endoscopy report documenting 2 small gastric ulcers. She needs laboratory work today. When she left the hospital her hemoglobin was up to 10.1. We will fax lab work to Dr. Matthias Hughs.

## 2010-09-13 NOTE — Consult Note (Signed)
  NAME:  Vanessa Mason, Vanessa Mason          ACCOUNT NO.:  [id number]  MEDICAL RECORD NO.:  [id number]           PATIENT TYPE:  I  LOCATION:  4740                         FACILITY:  [facility]  PHYSICIAN:  Bernette Redbird, M.D.   DATE OF BIRTH:  Sep 26, 1917  DATE OF CONSULTATION:  08/05/2010 DATE OF DISCHARGE:                                CONSULTATION   Gastroenterology consultation.  Dr. Mahala Menghini, I think it is of the [facility] asked [place] to see this 77 year old female because of melenic stool.  The patient was admitted to the hospital earlier today following a several-day history of intermittent dark stools associated with slight dizziness and a little bit of nausea but no significant prodromal dyspeptic symptoms.  She has a remote history, perhaps 30 years ago, of multiple duodenal ulcers.  She has not had significant aspirin or nonsteroidal anti-inflammatory drug exposure recently, just several Naprosyn pills in the past several days.  PAST MEDICAL HISTORY:  No known allergies.  OUTPATIENT MEDICATIONS:  Oxycodone, HCTZ.  OPERATIONS:  Back surgery.  CHRONIC MEDICAL ILLNESSES:  Possible hypertension.  No known coronary disease or COPD.  HABITS:  Nonsmoker, nondrinker.  FAMILY HISTORY:  Not obtained.  SOCIAL HISTORY:  Lives in [place].  REVIEW OF SYSTEMS:  Negative for prodromal dyspeptic symptoms, positive for mild dizziness.  PHYSICAL EXAMINATION:  GENERAL:  A remarkably spry chipper, well preserved Caucasian female in no acute distress.  Neither anxious nor depressed. EYES:  Anicteric.  No frank pallor. CHEST:  Clear with perhaps a soft expiratory rhonchus. HEART:  Normal except for borderline rapid rate about 100. ABDOMEN:  Without organomegaly, guarding, mass, or tenderness.  LABORATORY DATA:  Hemoglobin 8.5, BUN pending.  IMPRESSION:  Subacute gastrointestinal bleed most likely of upper tract origin based on melenic character stool.  PLAN:  Proceed to  endoscopic evaluation.  Ashby Dawes, purpose, and risks reviewed with the patient and daughter.  She is agreeable.  Further management to depend on the endoscopic findings.          ______________________________ Bernette Redbird, M.D.     RB/MEDQ  D:  08/05/2010  T:  08/06/2010  Job:  [id number]  cc:   Valetta Mole. Swords, MD  Electronically Signed by Bernette Redbird M.D. on 09/13/2010 12:44:24 PM

## 2010-09-13 NOTE — Op Note (Signed)
NAME:  Vanessa Mason, REBMAN          ACCOUNT NO.:  [id number]  MEDICAL RECORD NO.:  [id number]           PATIENT TYPE:  I  LOCATION:  4740                         FACILITY:  [facility]  PHYSICIAN:  Bernette Redbird, M.D.   DATE OF BIRTH:  March 22, 1918  DATE OF PROCEDURE:  08/05/2010 DATE OF DISCHARGE:                              OPERATIVE REPORT   PROCEDURE:  Upper endoscopy with biopsy.  INDICATION:  A 77 year old female with several-day history of melena.  FINDINGS:  Two active gastric ulcers, without active bleeding.  PROCEDURE IN DETAIL:  The nature, purpose, risks of the procedure had been discussed with the patient, who provided written consent.  She was brought from her hospital room to the Endoscopy Unit and sedated with Benadryl 25 mg IV, fentanyl 37.5 mcg IV, and Versed 3.5 mg IV prior to and during the course of the procedure without arrhythmias or significant desaturation, although she did transiently dipped down into the upper 80s at the start of the procedure.  The Pentax video endoscope was passed under direct vision.  The vocal cords were not well seen.  The esophagus was normal except for a ring- like narrowing at the GE junction, which did not pose any resistance to passage of the endoscope.  No reflux esophagitis, Barrett esophagus, varices, infection, neoplasia, or Mallory-Weiss tear were noted.  The patient did have a roughly 7-cm hiatal hernia with the squamocolumnar junction at about 30 cm from the mouth.  The stomach was entered.  It contained no blood or coffee-ground material.  There were two ulcers in the antrum of the stomach, one on the anterior aspect of the greater curve, the other on the posterior aspect of the greater curve.  There was no diffuse gastritis, and no polyps or masses including a retroflexed view of the cardia.  The pylorus, duodenal bulb, and second duodenum were normal except for a little bit of mucosal hemorrhage in the duodenum  consistent with some duodenitis.  Antral biopsies were obtained for CLO testing prior to removal of the scope.  The patient tolerated the procedure well, and there were no apparent complications.  IMPRESSION: 1. No active bleeding or blood in the stomach at the time of this     examination. 2. Two small antral gastric ulcers, benign in appearance, each     approximately 5 mm across.  One of them had a tiny red dot in the     center consistent with a stigma of hemorrhage, the other had some     overlying coffee-ground material.  There was no large adherent clot     and no protuberant visible vessel, so intervention such as     injection therapy or clipping was not performed.  PLAN: 1. Await CLO-test results.  I would treat the CLO-test if positive. 2. Intensive anti-peptic therapy. 3. I suspect this patient will not need to stay in the hospital very     long based on the endoscopic appearance.          ______________________________ Bernette Redbird, M.D.     RB/MEDQ  D:  08/05/2010  T:  08/06/2010  Job:  [id number]  cc:  Bruce Rexene Edison Swords, MD  Electronically Signed by Bernette Redbird M.D. on 09/13/2010 12:44:26 PM

## 2010-09-15 ENCOUNTER — Other Ambulatory Visit: Payer: Self-pay | Admitting: Internal Medicine

## 2010-10-11 NOTE — H&P (Signed)
NAME:  Vanessa Mason, Vanessa Mason          ACCOUNT NO.:  [id number]   MEDICAL RECORD NO.:  [id number]          PATIENT TYPE:  OBV   LOCATION:  1826                         FACILITY:  [facility]   PHYSICIAN:  Hollice Espy, M.D.DATE OF BIRTH:  Apr 16, 1918   DATE OF ADMISSION:  02/25/2007  DATE OF DISCHARGE:                              HISTORY & PHYSICAL   PRIMARY CARE PHYSICIAN:  Valetta Mole. Swords, M.D.   CHIEF COMPLAINT:  Chest discomfort.   HISTORY OF PRESENT ILLNESS:  The patient is an 77 year old white female  with a past medical history of hypertension, lower back arthritis, and  hyperlipidemia who has had no previous otherwise cardiac history but  then 3 days ago started having episodes of chest discomfort.  It was  described as a mid epigastric to mid sternal pressure about a 5 out of  10 with some mild radiation up to her shoulders.  She had no associated  shortness of breath, no nausea, vomiting, no lightheadedness.  These  symptoms continued from Friday to Saturday on and then intermittently  resolved on Sunday, however, returned today.  Her son became concerned  and brought the patient into the emergency room.   In the emergency room, the patient had an EKG done which showed a left  bundle branch block.  However, there were no previous EKGs to compare  to.  Her cardiac markers were normal.  The rest of her labs were  unremarkable.  The patient says this pressure sensation is still present  and the aspirin she received in the emergency room did little to change  this.  Currently other than this sense of pressure in her mid epigastric  to mid sternal area, everything else is stable.   REVIEW OF SYSTEMS:  She denies any headaches or visual changes,  dysphagia, no palpitations, no shortness of breath, wheezing or  coughing, no abdominal pain, no hematuria, or dysuria, no constipation  or diarrhea, no focal extremity numbness, weakness, pain.  Review of  systems is otherwise  negative.   PAST MEDICAL HISTORY:  1. Hypertension.  2. Hyperlipidemia.  3. DJD of the lumbar spine.   MEDICATIONS:  HCTZ, Mevacor, OxyContin b.i.d., and diclofenac, and  estradiol, and Flexeril.   She has no known drug allergies.   SOCIAL HISTORY:  She denies any tobacco, alcohol, or drug use.   FAMILY HISTORY:  Noncontributory.   PHYSICAL EXAMINATION:  VITAL SIGNS:  Temp 98.4, heart rate 80, blood  pressure 158/79, respirations 1 2, O2 sat 96% on room air.  GENERAL:  She is alert and oriented x3, no apparent distress.  HEENT:  Normocephalic atraumatic.  Mucous membranes are moist.  NECK:  She has no carotid bruits.  HEART:  Regular rate and rhythm.  S1 S2.  LUNGS:  Clear to auscultation bilaterally.  ABDOMEN:  Soft, nontender, nondistended.  Positive bowel sounds.  EXTREMITIES:  Showed no clubbing, cyanosis.  Trace pitting edema.  Good  2 plus pulses.   LABORATORY:  Sodium 140, potassium 4.1, chloride 104, bicarb is 30, BUN  30, creatinine 1.1, glucose 90.  CPK 61.6, MB 1.3, troponin less than  0.05.  Chest x-ray shows chronic COPD changes, nothing acute.  EKG shows  left axis deviation, left bundle branch block, no previous one to  compare.   ASSESSMENT/PLAN:  1. Chest discomfort, atypical with abnormal EKG.  We will plan to      admit the patient for 24-hour observation, place on telemetry,      check 2 more sets of cardiac markers.  We will discuss with Dr.      Cato Mulligan in the morning and compare EKGs.  We will decide whether or      not to have inpatient versus outpatient stress test.  If her EKG is      changed, then we will likely get inpatient stress test versus      outpatient.  2. Hyperlipidemia.  Continue medications.  3. Hypertension.  Continue medications.      Hollice Espy, M.D.  Electronically Signed     SKK/MEDQ  D:  02/25/2007  T:  02/25/2007  Job:  [id number]   cc:   Valetta Mole. Swords, MD

## 2010-10-11 NOTE — Discharge Summary (Signed)
NAME:  Vanessa Mason, Vanessa Mason          ACCOUNT NO.:  [id number]   MEDICAL RECORD NO.:  [id number]          PATIENT TYPE:  OBV   LOCATION:  6529                         FACILITY:  [facility]   PHYSICIAN:  Valerie A. Felicity Coyer, MDDATE OF BIRTH:  10-May-1918   DATE OF ADMISSION:  02/25/2007  DATE OF DISCHARGE:  02/27/2007                               DISCHARGE SUMMARY   DISCHARGE DIAGNOSES:  1. Atypical chest pain.  2. Hypertension.  3. Hyperlipidemia.  4. Degenerative joint disease of the lumbar spine.   HISTORY OF PRESENT ILLNESS:  Vanessa Mason is an 77 year old white female  with a past medical history of hypertension and low back arthritis as  well as hyperlipidemia, who presented with chief complaint of chest  discomfort.  She described on admission that her chest discomfort was  midepigastric to midsternal and internal pressure, about 5/10, with some  moderate radiation up to her shoulders.  It was not associated any  shortness of breath, nausea, vomiting or lightheadedness.  She was  admitted for further evaluation and treatment in the setting of a new  left bundle branch block discovered on EKG.   PAST MEDICAL HISTORY:  1. Hypertension.  2. Hyperlipidemia.  3. Degenerative joint disease of the lumbar spine.   COURSE OF HOSPITALIZATION:  Problem:  ATYPICAL CHEST PAIN.  The patient was admitted and underwent  serial cardiac enzymes, which were negative x3.  An old EKG from the  office was obtained, which was from 2002, which did not reveal a left  bundle branch block as seen on 12-lead performed in the emergency  department.  Cardiology consult was requested and the patient was seen  by Dr. Jens Som.  He recommended optimizing the patient's GI medications  for GERD and discharge to home.  The following day the patient's  symptoms resolved with an outpatient Myoview.  At this time the patient  has remained pain-free overnight and wishes to go home.  We have spoken  to [facility]  cardiology and they are in the process of setting up an  outpatient Myoview.  We have discontinued the patient's Zantac and in  place started Nexium.   MEDICATIONS AT TIME OF DISCHARGE:  1. Misoprostol 200 mcg p.o. b.i.d.  2. 75 mg p.o. b.i.d.  3. Hydrochlorothiazide 25 mg p.o. daily.  4. Lovastatin 40 mg p.o. daily.  5. Flexeril 10 mg p.o. daily as before.  6. Estradiol 1 mg tablets, one-half tablet p.o. daily as before.  7. Calcium 500 mg p.o. b.i.d.  8. OxyContin 10 mg p.o. b.i.d.  9. 81 mg p.o. daily.  10.Multivitamin 1 tablet p.o. daily as before.  11.Nexium 40 mg p.o. daily.   PERTINENT LABORATORIES AT TIME OF DISCHARGE:  Cardiac enzymes negative.  Hemoglobin 12.9, hematocrit 38.  BUN 30, creatinine 1.1.   DISPOSITION AND PLAN:  Discharge the patient to home.   FOLLOW-UP:  The patient is instructed to follow up with Bruce H. Swords,  MD, in 1-2 weeks and contact the office for an appointment.  She is also  instructed to follow up with Dr. Jens Som in the office, and she is to  follow up  for outpatient stress test which is being scheduled by [facility]  [facility].      Sandford Craze, NP      Raenette Rover. Felicity Coyer, MD  Electronically Signed    MO/MEDQ  D:  02/27/2007  T:  02/27/2007  Job:  [id number]   cc:   Valetta Mole. Cato Mulligan, MD  Madolyn Frieze. Jens Som, MD, [facility]

## 2010-10-11 NOTE — Group Therapy Note (Signed)
Referral by Dr. Cato Mulligan for the evaluation of chronic back pain.   HISTORY:  An 77 year old female with a long history of back pain.  She  has had a spinal fusion in 2000.  She cannot tell me exactly which level  she thinks is L4-5-6, although most people do not have an L6, do not  have any other imaging studies to corroborate with this in her notes.  I  did review notes from Dr. Cato Mulligan office.  She states that she has been  going to [facility], but complained that she really did  not get to see the doctor their other than the initial visit.  Her pain  is described as being in the lumbar in the left thoracic area.  She  states that in the past.  She has had herpes zoster in the left thoracic  area and is wondering whether this could be lingering from that problem.  I was able to review her E-chart.  She has had a hospitalization for  chest pain and rule out for MI.  She has a history of hypertension,  hyperlipidemia.  She has been on OxyContin at least since 2008 per  hospital records.  She has a history of GERD, but despite this she has  been on long-term diclofenac.  CT abdomen and pelvis showed no  significant abnormalities other than colonic diverticuli in the sigmoid  colon.  She has had SI joint.  X-rays showing some degeneration right  greater than left sacrum.  She also was found to have a posterior fusion  L3 through S1.  She did have a lumbarized S1 which may account for her  L6 history.  Thoracic spine dated March 05, 2006 showed diffuse  degenerative changes with osteopenia.  Upon review, she has some minimal  disk height loss in multiple levels of thoracic spine as well as at the  L1-2, L2-3 levels.  The patient's prior lumbar surgeries have been done  by Dr. Michaell Cowing in [facility]  Originally, had spondylolisthesis.   REVIEW OF SYSTEMS:  Positive for trouble walking, depression, easy  bleeding.   The patient goals include maintaining her activity level.  She  is also  concerned about certain medication side effects particularly NSAIDs.   SOCIAL HISTORY:  Widowed, lives alone.  No alcohol or drug use.   FAMILY HISTORY:  Drug abuse.   PHYSICAL EXAMINATION:  Blood pressure 158/80, pulse 62, respirations 18,  O2 sat 94% on room air.   Well-developed, well-nourished female, in no acute stress.  Orientation  x3.  Affect bright and alert.  Gait is normal.  No evidence of toe drag  or knee instability.  Her neck has 75% range of forward flexion,  extension, lateral rotation, and bending.  Upper extremity and lower  extremity strength are normal.  Upper extremity and lower extremity  sensation are normal.  Upper extremity and lower extremity deep tendon  reflexes are normal.  Her spine shows no evidence of scoliosis.  She  does have midline scar extending from the lower thoracic to the upper  sacral levels.   Her coordination is normal in the upper and lower extremities.  Extremities showed no evidence of edema.   IMPRESSION:  1. Lumbar post laminectomy syndrome.  She has had some increased pain      overtime.  We will check flexion, extension views of the lumbar      spine to see if there is any instability in the levels above the  fusion.  2. In terms of chronic pain management given her age, I would shy away      from long-acting medication such as OxyContin and said to try her      on some oxycodone.  3. We will trial her on Limbrel which is a generally recognized as      safe product natural anti-inflammatory available by prescription      only which will try as samples 500 mg b.i.d. in place of her      voltaren.  She could also stop her misoprostol given that she is      stopping her Voltaren.  4. Voltaren gel trial to back.  5. Consider medial branch blocks.  6. May need around the physical therapy.   I would like to get most recent MRI results of the lumbar spine.  This  was apparently done about a year ago at [facility]      Erick Colace, M.D.  Electronically Signed     AEK/MedQ  D:  01/26/2009 14:02:31  T:  01/27/2009 03:10:43  Job #:  [id number]   cc:   Valetta Mole. Swords, MD  [address] [place]  [place] [address]   Loraine Leriche L. Vear Clock, M.D.  Fax: [phone]

## 2010-10-11 NOTE — Assessment & Plan Note (Signed)
[facility]                            CARDIOLOGY OFFICE NOTE   NAME:[name], Vanessa Mason                 MRN:          [id number]  DATE:04/01/2007                            DOB:          1918-01-19    Vanessa Mason is an extremely pleasant 77 year old female that was  recently admitted to [facility] with chest pain.  The patient ruled out for myocardial infarction with serial enzymes.  She was discharged and had an outpatient Myoview performed on March 07, 2007.  This showed no ischemia or infarction but there was breast  attenuation.  Her ejection fraction was 53%.  She did see Dr. Matthias Mason in  follow-up who is her gastroenterologist.  He did place her on  omeprazole.  Since discharge, she has had no chest pain, shortness of  breath, palpitations, syncope or pedal edema.   MEDICATIONS:  1. Cytotec 200 mg p.o. b.i.d.  2. Voltaren 75 mg p.o. b.i.d.  3. Hydrochlorothiazide 25 mg p.o. daily.  4. Aspirin 81 mg p.o. daily.  5. Multivitamin.  6. Calcium.  7. Vitamin D.  8. Mevacor 40 mg p.o. nightly.  9. Estradiol.  10.Zantac.  11.Cyclobenzaprine.  12.Omega fish oil.  13.OxyContin.  14.Omeprazole.   PHYSICAL EXAMINATION:  VITAL SIGNS:  Blood pressure 115/70 and pulse 88.  She weighs 132 pounds.  HEENT:  Normal.  NECK:  Supple.  CHEST:  Clear.  CARDIOVASCULAR:  Regular rate and rhythm.  ABDOMEN:  No tenderness.  EXTREMITIES:  No edema.   DIAGNOSES:  1. Recent chest pain - the patient's symptoms were most consistent      with a gastrointestinal etiology and her Myoview shows no ischemia      or infarction.  Note, she did have a cardiac catheterization in      June of 2008, that showed normal coronary arteries as well.  We      will not pursue further ischemia evaluation.  2. Gastroesophageal reflux disease - she will continue on her present      medications and this is being managed by Dr. Matthias Mason.  3. Hypertension  - her blood pressure is well controlled on her present      medications.  4. Hyperlipidemia - she will continue on her Statin and this is being      followed by her primary care physician.  5. Degenerative joint disease.   She will follow up with Dr. Cato Mason and I will see her back on an as  needed basis.     Madolyn Frieze Jens Som, MD, [facility]  Electronically Signed    BSC/MedQ  DD: 04/01/2007  DT: 04/02/2007  Job #: [id number]

## 2010-10-11 NOTE — Consult Note (Signed)
NAME:  Vanessa Mason, Vanessa Mason          ACCOUNT NO.:  [id number]   MEDICAL RECORD NO.:  [id number]          PATIENT TYPE:  OBV   LOCATION:  6529                         FACILITY:  [facility]   PHYSICIAN:  Madolyn Frieze. Jens Som, MD, FACCDATE OF BIRTH:  1917-12-10   DATE OF CONSULTATION:  DATE OF DISCHARGE:                                 CONSULTATION   Vanessa Mason is a very pleasant 77 year old female with a past medical  history of gastroesophageal reflux disease, who we are asked to evaluate  for chest pain and an abnormal electrocardiogram.  The patient states  she had chest pain approximately 10 years ago.  At that time, she had a  stress test at the [facility] office and subsequent cardiac  catheterization.  I do not have those records available.  Apparently,  she was found to have no coronary disease.  She was treated with Zantac,  and her chest pain improved, and she has had none since then.  However,  Friday, she developed recurrent chest pain.  It is in the epigastric and  substernal area.  It was described as discomfort.  There was no  radiation of the pain.  The pain was not pleuritic or positional, nor is  related to food.  It was not exertional.  There was no associated water  brash.  There is no associated nausea, vomiting, shortness of breath or  diaphoresis.  The pain was continuous for 48 hours and then resolved  Saturday.  She felt well Sunday, but then the pain recurred yesterday,  and she was admitted to the hospital with her symptoms.  Because of her  symptoms, we are asked to further evaluate.  Note, the patient does  exercise routinely and typically does not have dyspnea on exertion,  orthopnea, PND, pedal edema, palpitations, pre-syncope, syncope or  exertional chest pain.   PAST MEDICAL HISTORY:  There is no diabetes mellitus, hypertension,  hyperlipidemia, by her report.  She does have degenerative joint  disease.  She has had a previous hysterectomy, back surgery,  bilateral  hernia repair, arthroscopic knee surgery, arthroscopic shoulder surgery.   ALLERGIES:  SHE HAS NO KNOWN DRUG ALLERGIES.   MEDICATIONS AT PRESENT:  Include __________ b.i.d., diclofenac, Zantac,  Mevacor, estradiol, cyclobenzaprine, calcium, OxyContin,  hydrochlorothiazide, multivitamin and aspirin.   SOCIAL HISTORY:  She smoked when she was a teenager but has not smoked  since.  She rarely consumes alcohol.   FAMILY HISTORY:  Negative coronary disease.   REVIEW OF SYSTEMS:  She denies any headaches or fevers or chills.  There  is no productive cough or hemoptysis.  There is no dysphagia,  odynophagia, melena, hematochezia.  There is no dysuria or hematuria, no  rash or seizure activity.  There is orthopnea, PND or pedal edema.  There is no claudication noted.  The remaining systems are negative.   PHYSICAL EXAMINATION:  VITAL SIGNS:  Her physical exam today shows a  blood pressure of 119/64.  Her pulse is 76.  She has a temperature of  98.5.  She is 95% saturated on room air.  Respiratory rate is 18.  GENERAL:  She  is well-developed, well-nourished, no acute stress.  SKIN:  Warm and dry.  She does not appear to be depressed.  There is no  peripheral clubbing.  BACK:  Normal.  HEENT:  Normal with normal eyelids.  NECK:  Supple with normal upstroke bilaterally, does not appear to be  bruits.  There is no jugular venous distention, and no thyromegaly is  noted.  CHEST:  Clear to auscultation, normal expansion.  CARDIOVASCULAR:  Reveals a regular rate and rhythm.  Heart sounds are  distant.  I cannot appreciate murmurs, rubs or gallops.  I cannot  palpate a PMI.  Abdominal exam is nontender, nondistended.  Positive  bowel sounds, no hepatosplenomegaly.  No mass appreciated.  There is no  abdominal bruit.  She is 2+ femoral pulses bilaterally.  No bruits.  EXTREMITIES:  Show no edema.  I could palpate no cords.  She has 2+  posterior tibial pulses bilaterally.   NEUROLOGIC:  Grossly intact.  Note she does have some varicosities on  her lower extremities.   She has had cardiac enzymes that have been negative x2.  Her BUN was 30,  her sodium was 140, and her potassium was 4.1.  Her hemoglobin and  hematocrit were 12.9 and 38 respectively.  Her creatinine was 1.1.  Chest x-ray shows chronic bronchitic changes and basilar scarring, but  there was no acute findings.  Her electrocardiogram shows a sinus rhythm  at a rate of 77.  There is a left bundle branch block.   DIAGNOSIS:  1. Atypical chest pain - Vanessa Mason's chest pain is atypical in      description.  It was continuous for 48 hours prior to admission and      has been recurrent while here.  Her cardiac markers have been      normal today.  Her electrocardiogram does show a new left bundle      branch block, but there is no old one for comparison, since 2002.      Her pain may be GI in etiology.  We would recommend discontinuing      her Zantac, and I will instead try Nexium.  If she has no further      pain with the addition of this and her follow-up markers are      negative, then she can be discharged safely tomorrow morning, and      we will schedule an outpatient Myoview for risk stratification.  2.      Abnormal echocardiogram - the patient does have a left bundle      branch block, and we will schedule a Myoview as described above.  2. Gastric reflux disease - Her Zantac will be discontinued, and she      will be tried on Nexium for her possible reflux symptoms.  If her      Myoview is unremarkable and she continues to have symptoms, then      she may need followup with her gastroenterologist at home.      Madolyn Frieze Jens Som, MD, [facility]  Electronically Signed     BSC/MEDQ  D:  02/26/2007  T:  02/26/2007  Job:  [id number]

## 2010-10-28 ENCOUNTER — Encounter: Payer: Self-pay | Admitting: Family Medicine

## 2010-10-28 ENCOUNTER — Ambulatory Visit (INDEPENDENT_AMBULATORY_CARE_PROVIDER_SITE_OTHER): Payer: Medicare Other | Admitting: Family Medicine

## 2010-10-28 VITALS — BP 130/88 | Temp 98.4°F | Wt 128.0 lb

## 2010-10-28 DIAGNOSIS — B029 Zoster without complications: Secondary | ICD-10-CM

## 2010-10-28 MED ORDER — VALACYCLOVIR HCL 1 G PO TABS: 1000.0000 mg | ORAL_TABLET | Freq: Three times a day (TID) | ORAL | Status: DC

## 2010-10-28 NOTE — Progress Notes (Signed)
  Subjective:    Patient ID: Vanessa Mason, female    DOB: [birth year], 77 y.o.   MRN: [id number]  HPI Patient seen with rash left posterior thigh noted about 4 days ago. Throbbing and burning quality. No pruritus. Denies fever or chills. No drainage. Patient states she has had shingles at least 4 times previously. Pain currently 3/10 severity and mild. She has not taken any medications for this.   Review of Systems  Constitutional: Negative for fever and chills.  Musculoskeletal: Negative for gait problem.  Skin: Positive for rash.  Neurological: Negative for weakness.       Objective:   Physical Exam  Constitutional: She is oriented to person, place, and time. She appears well-developed and well-nourished. No distress.  Cardiovascular: Normal rate, regular rhythm and normal heart sounds.   Pulmonary/Chest: Effort normal and breath sounds normal. No respiratory distress. She has no wheezes. She has no rales.  Neurological: She is alert and oriented to person, place, and time.  Skin:       Patient has a rash left posterior thigh. Area involved 2 by 4 cm. Erythematous base with vesicular surface. Slightly tender to palpation. No pustules.          Assessment & Plan:  Recurrent shingles left posterior thigh. Minimal pain. Given age, go ahead and start Valtrex 1 g 3 times a day for 7 days.

## 2010-10-28 NOTE — Patient Instructions (Signed)
Shingles (Herpes Zoster)  Shingles is caused by the same virus that causes chicken pox (varicella zoster virus or VZV). Shingles often occurs many years or decades after having chicken pox. That is why it is more common in adults older than 50 years. The virus reactivates and breaks out as an infection in a nerve root.  SYMPTOMS   The initial feeling (sensations) may be pain. This pain is usually described as:    Burning.    Stabbing.    Throbbing.    Tingling in the nerve root.    A red rash will follow in a couple days. The rash may occur in any area of the body and is usually on one side (unilateral) of the body in a band or belt-like pattern. The rash usually starts out as very small blisters (vesicles). They will dry up after 7 to 10 days. This is not usually a significant problem except for the pain it causes.    Long lasting (chronic) pain is more likely in an elderly person. It can last months to years. This condition is called post-herpetic neuralgia.   Shingles can be an extremely severe infection in someone with AIDS, a weakened immune system or with forms of leukemia. It can also be severe if you are taking transplant medications or other medications that weaken the immune system.  TREATMENT  Your caregiver will often treat you with:   Antiviral drugs.    Anti-inflammatory drugs.    Pain medications.    Bed rest is very important in preventing the pain associated with herpes zoster (post-herpetic neuralgia).    Application of heat in the form of a hot-water bottle or electric heating pad or gentle pressure with the hand is recommended to help with the pain or discomfort.   PREVENTION  A varicella zoster vaccine is available to help protect against the virus. The Food and Drug Administration approved the varicella zoster vaccine for individuals 75 years of age and older.  HOME CARE INSTRUCTIONS   Cool compresses to the area of rash may be helpful.    Only take over-the-counter or  prescription medicines for pain, discomfort or fever as directed by your caregiver.    Avoid contact with:    Babies.    Pregnant women.    Children with eczema.    Elderly people with transplants.    People with chronic illnesses, such as leukemia and AIDS.    If the area involved is on your face, you may receive a referral for follow-up to a specialist. It is very important to keep all follow-up appointments. This will help avoid eye complications, chronic pain or disability.   SEEK IMMEDIATE MEDICAL CARE IF:   You develop any pain (headache) in the area of the face or eye. This must be followed carefully by your caregiver or ophthalmologist. An infection in part of your eye (cornea) can be very serious. It could lead to blindness.    You do not have pain relief from prescribed medications.    The redness or swelling spreads.    The area involved becomes very swollen and painful.    You have an oral temperature above 102 F (38 C), not controlled by medicine.    You notice any red or painful lines extending away from the affected area toward your heart (lymphangitis).    Your condition is worsening or has changed.   Document Released: 05/15/2005 Document Re-Released: 11/02/2009  Exit[employer] Patient Information 2011 [employer].

## 2010-11-14 ENCOUNTER — Other Ambulatory Visit: Payer: Self-pay | Admitting: Internal Medicine

## 2010-12-06 ENCOUNTER — Encounter: Payer: Self-pay | Admitting: Internal Medicine

## 2010-12-06 ENCOUNTER — Ambulatory Visit (INDEPENDENT_AMBULATORY_CARE_PROVIDER_SITE_OTHER): Payer: Medicare Other | Admitting: Internal Medicine

## 2010-12-06 DIAGNOSIS — J069 Acute upper respiratory infection, unspecified: Secondary | ICD-10-CM

## 2010-12-06 DIAGNOSIS — I10 Essential (primary) hypertension: Secondary | ICD-10-CM

## 2010-12-06 NOTE — Patient Instructions (Signed)
Call or return to clinic prn if these symptoms worsen or fail to improve as anticipated.

## 2010-12-06 NOTE — Progress Notes (Signed)
  Subjective:    Patient ID: Vanessa Mason, female    DOB: Nov 22, 1917, 77 y.o.   MRN: [id number]  HPI  77 year old patient who is in today for followup. She has a history of treated hypertension which has been stable. She was [place] a [place] 2 weeks ago and for family members have developed acute viral illness. For the past day she has had some hoarseness but she is quite concerned about becoming more ill with significant cough as have 4 other family members. She has no cough or fever at present.    Review of Systems  Constitutional: Negative.   HENT: Negative for hearing loss, congestion, sore throat, rhinorrhea, dental problem, sinus pressure and tinnitus.        Hoarse  Eyes: Negative for pain, discharge and visual disturbance.  Respiratory: Negative for cough and shortness of breath.   Cardiovascular: Negative for chest pain, palpitations and leg swelling.  Gastrointestinal: Negative for nausea, vomiting, abdominal pain, diarrhea, constipation, blood in stool and abdominal distention.  Genitourinary: Negative for dysuria, urgency, frequency, hematuria, flank pain, vaginal bleeding, vaginal discharge, difficulty urinating, vaginal pain and pelvic pain.  Musculoskeletal: Negative for joint swelling, arthralgias and gait problem.  Skin: Negative for rash.  Neurological: Negative for dizziness, syncope, speech difficulty, weakness, numbness and headaches.  Hematological: Negative for adenopathy.  Psychiatric/Behavioral: Negative for behavioral problems, dysphoric mood and agitation. The patient is not nervous/anxious.        Objective:   Physical Exam  Constitutional: She is oriented to person, place, and time. She appears well-developed and well-nourished.  HENT:  Head: Normocephalic.  Right Ear: External ear normal.  Left Ear: External ear normal.  Mouth/Throat: Oropharynx is clear and moist.  Eyes: Conjunctivae and EOM are normal. Pupils are equal, round, and reactive to  light.  Neck: Normal range of motion. Neck supple. No thyromegaly present.  Cardiovascular: Normal rate, regular rhythm, normal heart sounds and intact distal pulses.   Pulmonary/Chest: Effort normal and breath sounds normal.  Abdominal: Soft. Bowel sounds are normal. She exhibits no mass. There is no tenderness.  Musculoskeletal: Normal range of motion.  Lymphadenopathy:    She has no cervical adenopathy.  Neurological: She is alert and oriented to person, place, and time.  Skin: Skin is warm and dry. No rash noted.  Psychiatric: She has a normal mood and affect. Her behavior is normal.          Assessment & Plan:   Mild URI with hoarseness and  She was reassured. She will call if she develops any worsening symptoms

## 2010-12-08 ENCOUNTER — Ambulatory Visit: Payer: Medicare Other | Admitting: Internal Medicine

## 2010-12-14 ENCOUNTER — Other Ambulatory Visit (INDEPENDENT_AMBULATORY_CARE_PROVIDER_SITE_OTHER): Payer: Medicare Other

## 2010-12-14 DIAGNOSIS — E785 Hyperlipidemia, unspecified: Secondary | ICD-10-CM

## 2010-12-14 LAB — LIPID PANEL
Cholesterol: 180 mg/dL (ref 0–200)
HDL: 61.8 mg/dL (ref >39.00)
LDL Cholesterol: 91 mg/dL (ref 0–99)
Total CHOL/HDL Ratio: 3
Triglycerides: 137 mg/dL (ref 0.0–149.0)
VLDL: 27.4 mg/dL (ref 0.0–40.0)

## 2010-12-16 ENCOUNTER — Other Ambulatory Visit: Payer: Self-pay | Admitting: Internal Medicine

## 2011-01-16 ENCOUNTER — Encounter: Payer: Self-pay | Admitting: Internal Medicine

## 2011-01-16 ENCOUNTER — Ambulatory Visit (INDEPENDENT_AMBULATORY_CARE_PROVIDER_SITE_OTHER): Payer: Medicare Other | Admitting: Internal Medicine

## 2011-01-16 DIAGNOSIS — M549 Dorsalgia, unspecified: Secondary | ICD-10-CM

## 2011-01-16 DIAGNOSIS — I10 Essential (primary) hypertension: Secondary | ICD-10-CM

## 2011-01-16 DIAGNOSIS — E785 Hyperlipidemia, unspecified: Secondary | ICD-10-CM

## 2011-01-16 NOTE — Assessment & Plan Note (Signed)
She is doing well with OTC meds

## 2011-01-16 NOTE — Assessment & Plan Note (Signed)
Lab Results  Component Value Date   CHOL 180 12/14/2010   CHOL 315* 09/12/2010   CHOL 179 06/23/2010   Lab Results  Component Value Date   HDL 61.80 12/14/2010   HDL 16.10 09/12/2010   HDL 96.04 06/23/2010   Lab Results  Component Value Date   LDLCALC 91 12/14/2010   LDLCALC 95 06/23/2010   LDLCALC 98 12/16/2009   Lab Results  Component Value Date   TRIG 137.0 12/14/2010   TRIG 197.0* 09/12/2010   TRIG 103.0 06/23/2010   Lab Results  Component Value Date   CHOLHDL 3 12/14/2010   CHOLHDL 5 09/12/2010   CHOLHDL 3 06/23/2010   Lab Results  Component Value Date   LDLDIRECT 218.7 09/12/2010   LDLDIRECT 118.7 06/06/2007   LDLDIRECT 90.0 10/03/2006   Doing well, continue current meds

## 2011-01-16 NOTE — Progress Notes (Signed)
  Subjective:    Patient ID: Vanessa Mason, female    DOB: September 19, 1917, 77 y.o.   MRN: [id number]  HPI  Lipids---started lovastatin, tolerating well  htn---tolerating meds  Back pain---doing well, continue exercise  Past Medical History  Diagnosis Date  . BACK PAIN 03/08/2007  . DEPRESSIVE DISORDER 12/01/2008  . HIATAL HERNIA 12/17/2006  . HYPERLIPIDEMIA 03/09/2006  . HYPERTENSION 03/09/2006  . KNEE PAIN 05/06/2009   Past Surgical History  Procedure Date  . Abdominal hysterectomy   . Oophorectomy   . Thyroidectomy   . Rotator cuff repair   . Laminectomy     with fusion  . Cardiac catheterization     reports that she has never smoked. She does not have any smokeless tobacco history on file. Her alcohol and drug histories not on file. family history is not on file. Allergies  Allergen Reactions  . Naproxen     Gi bleed  . Nsaids      Review of Systems     Objective:   Physical Exam        Assessment & Plan:

## 2011-01-16 NOTE — Assessment & Plan Note (Signed)
BP Readings from Last 3 Encounters:  01/16/11 124/74  12/06/10 108/70  10/28/10 130/88   Well controlled, continue meds

## 2011-02-09 ENCOUNTER — Other Ambulatory Visit: Payer: Self-pay | Admitting: Internal Medicine

## 2011-03-08 ENCOUNTER — Ambulatory Visit (INDEPENDENT_AMBULATORY_CARE_PROVIDER_SITE_OTHER): Payer: Medicare Other

## 2011-03-08 DIAGNOSIS — Z23 Encounter for immunization: Secondary | ICD-10-CM

## 2011-03-09 LAB — CK TOTAL AND CKMB (NOT AT ARMC)
CK, MB: 1.9
Relative Index: INVALID
Total CK: 67

## 2011-03-09 LAB — POCT CARDIAC MARKERS
CKMB, poc: 1.3
Myoglobin, poc: 61.6
Operator id: 196461
Troponin i, poc: <0.05

## 2011-03-09 LAB — I-STAT 8, (EC8 V) (CONVERTED LAB)
Acid-Base Excess: 4 — ABNORMAL HIGH
BUN: 30 — ABNORMAL HIGH
Bicarbonate: 29.9 — ABNORMAL HIGH
Chloride: 104
Glucose, Bld: 90
HCT: 38
Hemoglobin: 12.9
Operator id: 196461
Potassium: 4.1
Sodium: 140
TCO2: 31
pCO2, Ven: 51.4 — ABNORMAL HIGH
pH, Ven: 7.373 — ABNORMAL HIGH

## 2011-03-09 LAB — CARDIAC PANEL(CRET KIN+CKTOT+MB+TROPI)
CK, MB: 1.6
CK, MB: 1.9
Relative Index: INVALID
Relative Index: INVALID
Total CK: 53
Total CK: 62
Troponin I: 0.02
Troponin I: 0.02

## 2011-03-09 LAB — POCT I-STAT CREATININE
Creatinine, Ser: 1.1
Operator id: 196461

## 2011-03-09 LAB — TROPONIN I: Troponin I: 0.02

## 2011-03-20 ENCOUNTER — Other Ambulatory Visit: Payer: Self-pay | Admitting: Internal Medicine

## 2011-05-11 ENCOUNTER — Other Ambulatory Visit: Payer: Self-pay | Admitting: Internal Medicine

## 2011-05-25 ENCOUNTER — Other Ambulatory Visit: Payer: Self-pay | Admitting: Internal Medicine

## 2011-06-12 ENCOUNTER — Other Ambulatory Visit: Payer: Self-pay | Admitting: Internal Medicine

## 2011-07-18 ENCOUNTER — Ambulatory Visit: Payer: Medicare Other | Admitting: Internal Medicine

## 2011-07-20 ENCOUNTER — Ambulatory Visit (INDEPENDENT_AMBULATORY_CARE_PROVIDER_SITE_OTHER): Payer: Medicare Other | Admitting: Internal Medicine

## 2011-07-20 ENCOUNTER — Encounter: Payer: Self-pay | Admitting: Internal Medicine

## 2011-07-20 VITALS — BP 130/84 | HR 88 | Temp 97.9°F | Wt 133.0 lb

## 2011-07-20 DIAGNOSIS — K922 Gastrointestinal hemorrhage, unspecified: Secondary | ICD-10-CM

## 2011-07-20 DIAGNOSIS — E785 Hyperlipidemia, unspecified: Secondary | ICD-10-CM

## 2011-07-20 DIAGNOSIS — I10 Essential (primary) hypertension: Secondary | ICD-10-CM

## 2011-07-20 LAB — CBC WITH DIFFERENTIAL/PLATELET
Basophils Absolute: 0 10*3/uL (ref 0.0–0.1)
Basophils Relative: 0.7 % (ref 0.0–3.0)
Eosinophils Absolute: 0.2 10*3/uL (ref 0.0–0.7)
Eosinophils Relative: 2.9 % (ref 0.0–5.0)
HCT: 39.3 % (ref 36.0–46.0)
Hemoglobin: 13.1 g/dL (ref 12.0–15.0)
Lymphocytes Relative: 32 % (ref 12.0–46.0)
Lymphs Abs: 2 10*3/uL (ref 0.7–4.0)
MCHC: 33.2 g/dL (ref 30.0–36.0)
MCV: 91.6 fl (ref 78.0–100.0)
Monocytes Absolute: 0.5 10*3/uL (ref 0.1–1.0)
Monocytes Relative: 8.2 % (ref 3.0–12.0)
Neutro Abs: 3.5 10*3/uL (ref 1.4–7.7)
Neutrophils Relative %: 56.2 % (ref 43.0–77.0)
Platelets: 183 10*3/uL (ref 150.0–400.0)
RBC: 4.29 Mil/uL (ref 3.87–5.11)
RDW: 14.2 % (ref 11.5–14.6)
WBC: 6.3 10*3/uL (ref 4.5–10.5)

## 2011-07-20 LAB — HEPATIC FUNCTION PANEL
ALT: 16 U/L (ref 0–35)
AST: 22 U/L (ref 0–37)
Albumin: 4.2 g/dL (ref 3.5–5.2)
Alkaline Phosphatase: 51 U/L (ref 39–117)
Bilirubin, Direct: 0.1 mg/dL (ref 0.0–0.3)
Total Bilirubin: 0.9 mg/dL (ref 0.3–1.2)
Total Protein: 6.6 g/dL (ref 6.0–8.3)

## 2011-07-20 LAB — BASIC METABOLIC PANEL
BUN: 27 mg/dL — ABNORMAL HIGH (ref 6–23)
CO2: 32 mEq/L (ref 19–32)
Calcium: 9.7 mg/dL (ref 8.4–10.5)
Chloride: 103 mEq/L (ref 96–112)
Creatinine, Ser: 0.8 mg/dL (ref 0.4–1.2)
GFR: 76.83 mL/min (ref >60.00)
Glucose, Bld: 87 mg/dL (ref 70–99)
Potassium: 4.5 mEq/L (ref 3.5–5.1)
Sodium: 141 mEq/L (ref 135–145)

## 2011-07-20 LAB — LIPID PANEL
Cholesterol: 210 mg/dL — ABNORMAL HIGH (ref 0–200)
HDL: 67.4 mg/dL (ref >39.00)
Total CHOL/HDL Ratio: 3
Triglycerides: 167 mg/dL — ABNORMAL HIGH (ref 0.0–149.0)
VLDL: 33.4 mg/dL (ref 0.0–40.0)

## 2011-07-20 LAB — LDL CHOLESTEROL, DIRECT: Direct LDL: 118.7 mg/dL

## 2011-07-20 LAB — TSH: TSH: 0.85 u[IU]/mL (ref 0.35–5.50)

## 2011-07-20 NOTE — Progress Notes (Signed)
Patient ID: Vanessa Mason, female   DOB: Aug 04, 1917, 78 y.o.   MRN: [id number] Patient Active Problem List  Diagnoses  . HYPERLIPIDEMIA---tolerating lovastatin  . DEPRESSIVE DISORDER--she is feeling well  . HYPERTENSION--tolerating hctz   Past Medical History  Diagnosis Date  . BACK PAIN 03/08/2007  . DEPRESSIVE DISORDER 12/01/2008  . HIATAL HERNIA 12/17/2006  . HYPERLIPIDEMIA 03/09/2006  . HYPERTENSION 03/09/2006  . KNEE PAIN 05/06/2009    History   Social History  . Marital Status: Widowed    Spouse Name: N/A    Number of Children: N/A  . Years of Education: N/A   Occupational History  . Not on file.   Social History Main Topics  . Smoking status: Never Smoker   . Smokeless tobacco: Not on file  . Alcohol Use: Not on file  . Drug Use: Not on file  . Sexually Active: Not on file   Other Topics Concern  . Not on file   Social History Narrative  . No narrative on file    Past Surgical History  Procedure Date  . Abdominal hysterectomy   . Oophorectomy   . Thyroidectomy   . Rotator cuff repair   . Laminectomy     with fusion  . Cardiac catheterization     No family history on file.  Allergies  Allergen Reactions  . Naproxen     Gi bleed  . Nsaids     Current Outpatient Prescriptions on File Prior to Visit  Medication Sig Dispense Refill  . acetaminophen (TYLENOL) 650 MG CR tablet Take 650 mg by mouth 4 (four) times daily.       . Calcium Carbonate-Vitamin D (CALTRATE 600+D) 600-400 MG-UNIT per tablet Take 2 tablets by mouth daily.       . Cholecalciferol (VITAMIN D) 2000 UNITS CAPS Take by mouth.        . cyclobenzaprine (FLEXERIL) 10 MG tablet TAKE 1/2 TABLET BY MOUTH AT   BEDTIME  15 tablet  5  . hydrochlorothiazide (HYDRODIURIL) 25 MG tablet TAKE ONE TABLET BY MOUTH ONE  TIME DAILY  30 tablet  4  . KRILL OIL 1000 MG CAPS Take by mouth.        . lovastatin (MEVACOR) 40 MG tablet TAKE ONE TABLET BY MOUTH ONE  TIME DAILY  30 tablet  4  . Lutein 20  MG CAPS Take by mouth daily.        . Multiple Vitamin (MULTIVITAMIN) tablet Take 1 tablet by mouth daily.           patient denies chest pain, shortness of breath, orthopnea. Denies lower extremity edema, abdominal pain, change in appetite, change in bowel movements. Patient denies rashes, musculoskeletal complaints. No other specific complaints in a complete review of systems.   Pulse 88  Temp(Src) 97.9 F (36.6 C) (Oral)  Wt 133 lb (60.328 kg)  SpO2 99%  Well-developed well-nourished female in no acute distress. HEENT exam atraumatic, normocephalic, extraocular muscles are intact. Neck is supple. No jugular venous distention no thyromegaly. Chest clear to auscultation without increased work of breathing. Cardiac exam S1 and S2 are regular. Abdominal exam active bowel sounds, soft, nontender.

## 2011-07-20 NOTE — Assessment & Plan Note (Signed)
130/82 Continue hctz

## 2011-07-20 NOTE — Assessment & Plan Note (Signed)
Tolerating meds Lab Results  Component Value Date   CHOL 180 12/14/2010   CHOL 315* 09/12/2010   CHOL 179 06/23/2010   Lab Results  Component Value Date   HDL 61.80 12/14/2010   HDL 16.10 09/12/2010   HDL 96.04 06/23/2010   Lab Results  Component Value Date   LDLCALC 91 12/14/2010   LDLCALC 95 06/23/2010   LDLCALC 98 12/16/2009   Lab Results  Component Value Date   TRIG 137.0 12/14/2010   TRIG 197.0* 09/12/2010   TRIG 103.0 06/23/2010   Lab Results  Component Value Date   CHOLHDL 3 12/14/2010   CHOLHDL 5 09/12/2010   CHOLHDL 3 06/23/2010   Lab Results  Component Value Date   LDLDIRECT 218.7 09/12/2010   LDLDIRECT 118.7 06/06/2007   LDLDIRECT 90.0 10/03/2006

## 2011-07-21 NOTE — Progress Notes (Signed)
Pt informed and copy out front

## 2011-08-07 ENCOUNTER — Other Ambulatory Visit: Payer: Self-pay | Admitting: Internal Medicine

## 2011-08-09 ENCOUNTER — Other Ambulatory Visit: Payer: Self-pay | Admitting: *Deleted

## 2011-08-09 MED ORDER — HYDROCHLOROTHIAZIDE 25 MG PO TABS: 25.0000 mg | ORAL_TABLET | Freq: Every day | ORAL | Status: DC

## 2011-08-14 ENCOUNTER — Other Ambulatory Visit: Payer: Self-pay | Admitting: Internal Medicine

## 2011-08-14 DIAGNOSIS — Z1231 Encounter for screening mammogram for malignant neoplasm of breast: Secondary | ICD-10-CM

## 2011-09-12 ENCOUNTER — Ambulatory Visit
Admission: RE | Admit: 2011-09-12 | Discharge: 2011-09-12 | Disposition: A | Discharge status: Home / Self Care | Payer: Medicare Other | Source: Ambulatory Visit | Attending: Internal Medicine | Admitting: Internal Medicine

## 2011-09-12 DIAGNOSIS — Z1231 Encounter for screening mammogram for malignant neoplasm of breast: Secondary | ICD-10-CM

## 2011-09-25 ENCOUNTER — Ambulatory Visit: Payer: Medicare Other | Admitting: Internal Medicine

## 2011-10-05 ENCOUNTER — Encounter: Payer: Self-pay | Admitting: Internal Medicine

## 2011-10-05 ENCOUNTER — Ambulatory Visit (INDEPENDENT_AMBULATORY_CARE_PROVIDER_SITE_OTHER): Payer: Medicare Other | Admitting: Internal Medicine

## 2011-10-05 VITALS — BP 120/80 | Temp 98.0°F | Wt 136.0 lb

## 2011-10-05 DIAGNOSIS — R059 Cough, unspecified: Secondary | ICD-10-CM

## 2011-10-05 DIAGNOSIS — I10 Essential (primary) hypertension: Secondary | ICD-10-CM

## 2011-10-05 DIAGNOSIS — R05 Cough: Secondary | ICD-10-CM

## 2011-10-05 MED ORDER — MOMETASONE FUROATE 50 MCG/ACT NA SUSP: 2.0000 | Freq: Every day | NASAL | Status: DC

## 2011-10-05 NOTE — Patient Instructions (Signed)
Nasonex 2 sprays in to the nose daily  Call if unimproved

## 2011-10-05 NOTE — Progress Notes (Signed)
  Subjective:    Patient ID: Vanessa Mason, female    DOB: 1917/07/26, 78 y.o.   MRN: [id number]  HPI  78 year old patient who is seen today with a chief complaint of cough she stated this first began in March while visiting her son in [place] cough is described as mild nonproductive and occurs throughout the day and night. She has received some modest benefit with cough drops. She has not tried any antitussives. No new medications. She has had treated hypertension controlled on diuretic therapy. She does have a history of hilar hernia but no reflux symptoms. Denies much in the way of allergy related symptoms such as postnasal drip    Review of Systems  Respiratory: Positive for cough.        Objective:   Physical Exam  Constitutional: She is oriented to person, place, and time. She appears well-developed and well-nourished.  HENT:  Head: Normocephalic.  Right Ear: External ear normal.  Left Ear: External ear normal.  Mouth/Throat: Oropharynx is clear and moist.  Eyes: Conjunctivae and EOM are normal. Pupils are equal, round, and reactive to light.  Neck: Normal range of motion. Neck supple. No thyromegaly present.  Cardiovascular: Normal rate, regular rhythm, normal heart sounds and intact distal pulses.   Pulmonary/Chest: Effort normal and breath sounds normal.  Abdominal: Soft. Bowel sounds are normal. She exhibits no mass. There is no tenderness.  Musculoskeletal: Normal range of motion.  Lymphadenopathy:    She has no cervical adenopathy.  Neurological: She is alert and oriented to person, place, and time.  Skin: Skin is warm and dry. No rash noted.  Psychiatric: She has a normal mood and affect. Her behavior is normal.          Assessment & Plan:   Cough appears to be chronic but fairly mild unclear etiology. We'll try a nasal steroid first and observe further. Will hold off on antitussives and PPI therapy at this time Hypertension well controlled

## 2011-10-10 ENCOUNTER — Telehealth: Payer: Self-pay | Admitting: *Deleted

## 2011-10-10 NOTE — Telephone Encounter (Signed)
Pt was in to see Dr Kirtland Bouchard for cough and he gave her Nasonex.  Pt wants to know if he wants her to take it for one week or use the whole thing up?

## 2011-10-10 NOTE — Telephone Encounter (Signed)
complete

## 2011-10-10 NOTE — Telephone Encounter (Signed)
Line busy x6

## 2011-10-11 NOTE — Telephone Encounter (Signed)
Pt informed

## 2011-12-04 ENCOUNTER — Other Ambulatory Visit: Payer: Self-pay | Admitting: Internal Medicine

## 2012-01-16 ENCOUNTER — Ambulatory Visit (INDEPENDENT_AMBULATORY_CARE_PROVIDER_SITE_OTHER): Payer: Medicare Other | Admitting: Internal Medicine

## 2012-01-16 ENCOUNTER — Encounter: Payer: Self-pay | Admitting: Internal Medicine

## 2012-01-16 VITALS — BP 134/86 | HR 84 | Temp 98.5°F | Wt 136.0 lb

## 2012-01-16 DIAGNOSIS — M549 Dorsalgia, unspecified: Secondary | ICD-10-CM

## 2012-01-16 MED ORDER — TRAMADOL HCL 50 MG PO TABS: 50.0000 mg | ORAL_TABLET | Freq: Three times a day (TID) | ORAL | Status: DC | PRN

## 2012-01-16 NOTE — Progress Notes (Signed)
Patient ID: Vanessa Mason, female   DOB: 1917-09-02, 78 y.o.   MRN: [id number]  Concerned with weight gain-- 10 pounds in one year Good appetite and still exercising  Back pain- tolerating tylenol.  htn- tolerating meds  Fatigue-- she will take naps during the day. She does exercise 4 times weekly.  Past Medical History  Diagnosis Date  . BACK PAIN 03/08/2007  . DEPRESSIVE DISORDER 12/01/2008  . HIATAL HERNIA 12/17/2006  . HYPERLIPIDEMIA 03/09/2006  . HYPERTENSION 03/09/2006  . KNEE PAIN 05/06/2009    History   Social History  . Marital Status: Widowed    Spouse Name: N/A    Number of Children: N/A  . Years of Education: N/A   Occupational History  . Not on file.   Social History Main Topics  . Smoking status: Never Smoker   . Smokeless tobacco: Not on file  . Alcohol Use: Not on file  . Drug Use: Not on file  . Sexually Active: Not on file   Other Topics Concern  . Not on file   Social History Narrative  . No narrative on file    Past Surgical History  Procedure Date  . Abdominal hysterectomy   . Oophorectomy   . Thyroidectomy   . Rotator cuff repair   . Laminectomy     with fusion  . Cardiac catheterization     No family history on file.  Allergies  Allergen Reactions  . Naproxen     Gi bleed  . Nsaids     Current Outpatient Prescriptions on File Prior to Visit  Medication Sig Dispense Refill  . acetaminophen (TYLENOL) 650 MG CR tablet Take 650 mg by mouth 4 (four) times daily.       . Calcium Carbonate-Vitamin D (CALTRATE 600+D) 600-400 MG-UNIT per tablet Take 2 tablets by mouth daily.       . Cholecalciferol (VITAMIN D) 2000 UNITS CAPS Take by mouth.        . cyclobenzaprine (FLEXERIL) 10 MG tablet TAKE 1/2 TABLET BY MOUTH AT   BEDTIME  15 tablet  4  . hydrochlorothiazide (HYDRODIURIL) 25 MG tablet Take 1 tablet (25 mg total) by mouth daily.  30 tablet  5  . KRILL OIL 1000 MG CAPS Take by mouth.        . lovastatin (MEVACOR) 40 MG tablet  TAKE ONE TABLET BY MOUTH ONE    TIME DAILY  30 tablet  2  . Lutein 20 MG CAPS Take by mouth daily.        . Multiple Vitamin (MULTIVITAMIN) tablet Take 1 tablet by mouth daily.           patient denies chest pain, shortness of breath, orthopnea. Denies lower extremity edema, abdominal pain, change in appetite, change in bowel movements. Patient denies rashes, musculoskeletal complaints. No other specific complaints in a complete review of systems.   BP 140/92  Pulse 84  Temp 98.5 F (36.9 C) (Oral)  Wt 136 lb (61.689 kg)  Well-developed well-nourished female in no acute distress. HEENT exam atraumatic, normocephalic, extraocular muscles are intact. Neck is supple. No jugular venous distention no thyromegaly. Chest clear to auscultation without increased work of breathing. Cardiac exam S1 and S2 are regular. Abdominal exam active bowel sounds, soft, nontender. Extremities no edema.

## 2012-01-17 NOTE — Assessment & Plan Note (Signed)
Chronic pain syndrom Trial tramadol Side effects discussed

## 2012-01-30 ENCOUNTER — Other Ambulatory Visit: Payer: Self-pay | Admitting: Internal Medicine

## 2012-01-31 NOTE — Telephone Encounter (Signed)
This was taken off med list 01/26/12.  Looks like it was filled for back pain

## 2012-02-02 ENCOUNTER — Other Ambulatory Visit: Payer: Self-pay | Admitting: *Deleted

## 2012-02-02 MED ORDER — TRAMADOL HCL 50 MG PO TABS: 50.0000 mg | ORAL_TABLET | Freq: Three times a day (TID) | ORAL | Status: DC | PRN

## 2012-02-15 ENCOUNTER — Other Ambulatory Visit: Payer: Self-pay | Admitting: Internal Medicine

## 2012-02-15 NOTE — Telephone Encounter (Signed)
Patient is requesting a refill for tramadol.  She is requesting a 90 day supply  - so she does not have to go the pharmacy as often.  Pharmacy is [facility] on [address].

## 2012-02-19 ENCOUNTER — Ambulatory Visit (INDEPENDENT_AMBULATORY_CARE_PROVIDER_SITE_OTHER): Payer: Medicare Other | Admitting: Family Medicine

## 2012-02-19 VITALS — BP 124/80 | Temp 98.0°F | Wt 136.0 lb

## 2012-02-19 DIAGNOSIS — S46819A Strain of other muscles, fascia and tendons at shoulder and upper arm level, unspecified arm, initial encounter: Secondary | ICD-10-CM

## 2012-02-19 DIAGNOSIS — S43499A Other sprain of unspecified shoulder joint, initial encounter: Secondary | ICD-10-CM

## 2012-02-19 NOTE — Progress Notes (Addendum)
Chief Complaint  Patient presents with  . pain in neck down back    HPI:   ? Shingles -has had shingles 7 times in different areas and tx with acyclovir -started having pain in L shoulder and neck and then on R shoulder and neck yesterday -used capsaicin patch and this helped and she slept well - but pain is back today -pain is a 6/10 -turning head to the right makes pain worse -no falls or injury recently -denies: fevers, malaise, facial weakness, vision changes, headaches  ROS: See pertinent positives and negatives per HPI.  Past Medical History  Diagnosis Date  . BACK PAIN 03/08/2007  . DEPRESSIVE DISORDER 12/01/2008  . HIATAL HERNIA 12/17/2006  . HYPERLIPIDEMIA 03/09/2006  . HYPERTENSION 03/09/2006  . KNEE PAIN 05/06/2009    No family history on file.  History   Social History  . Marital Status: Widowed    Spouse Name: N/A    Number of Children: N/A  . Years of Education: N/A   Social History Main Topics  . Smoking status: Never Smoker   . Smokeless tobacco: Not on file  . Alcohol Use: Not on file  . Drug Use: Not on file  . Sexually Active: Not on file   Other Topics Concern  . Not on file   Social History Narrative  . No narrative on file    Current outpatient prescriptions:Calcium Carbonate-Vitamin D (CALTRATE 600+D) 600-400 MG-UNIT per tablet, Take 2 tablets by mouth daily. , Disp: , Rfl: ;  Cholecalciferol (VITAMIN D) 2000 UNITS CAPS, Take by mouth.  , Disp: , Rfl: ;  cyclobenzaprine (FLEXERIL) 10 MG tablet, TAKE 1/2 TABLET BY MOUTH AT   BEDTIME, Disp: 15 tablet, Rfl: 4 hydrochlorothiazide (HYDRODIURIL) 25 MG tablet, Take 1 tablet (25 mg total) by mouth daily., Disp: 30 tablet, Rfl: 5;  KRILL OIL 1000 MG CAPS, Take by mouth.  , Disp: , Rfl: ;  lovastatin (MEVACOR) 40 MG tablet, TAKE ONE TABLET BY MOUTH ONE    TIME DAILY, Disp: 30 tablet, Rfl: 2;  Lutein 20 MG CAPS, Take by mouth daily.  , Disp: , Rfl: ;  Multiple Vitamin (MULTIVITAMIN) tablet, Take 1 tablet  by mouth daily.  , Disp: , Rfl:  traMADol (ULTRAM) 50 MG tablet, TAKE 1 TABLET (50 MG TOTAL) BY MOUTH EVERY 8 (EIGHT) HOURS AS NEEDEDFOR  PAIN., Disp: 90 tablet, Rfl: 1;  acetaminophen (TYLENOL) 650 MG CR tablet, Take 650 mg by mouth 4 (four) times daily. , Disp: , Rfl:   EXAM:  Filed Vitals:   02/19/12 0913  BP: 124/80  Temp: 98 F (36.7 C)    There is no height on file to calculate BMI.  GENERAL: vitals reviewed and listed below, alert, oriented, appears well hydrated and in no acute distress  HEENT: atraumatic, conjucntiva clear, no obvious abnormalities on inspection of external nose and ears  NECK: no masses on inspection  SKIN: no vesicular eruptions or rash appreciable  MS: moves all extremities without noticeable abnormality, normal ROM - in neck and arms. Normal ROM. NV intact bilat upper extremites. Neg Spurling. TTP and muscle tension traps bilat L > R. No bony TTP.  CN: II-XII grosslly intact, sensation and strength normal bilat upper ext.  PSYCH: pleasant and cooperative, no obvious depression or anxiety  ASSESSMENT AND PLAN:  Discussed the following assessment and plan:  1. Trapezius muscle strain    No sign of shingles on exam and bilat symptoms more consistent with muscular pain. See recs  below. Discussed findings of exam and diagnosis and return precautions. Pt asked for muscle relaxer, but after discussion of risks/versus benefits opted to avoid. Pt has tramadol at home that she uses. Has used tylenol in the past and wondered if she could use this. Discussed proper usage.  No orders of the defined types were placed in this encounter.    Patient Instructions  -can use tylenol or tramadol as directed  -use rice pillow heat as needed and capsaicin patch as directed  -follow up with your doctor if worsening, new symptoms, skin rash or worsening or not improving over next several weeks  Thank you for enrolling in MyChart. Please follow the instructions  below to securely access your online medical record. MyChart allows you to send messages to your doctor, view your test results, renew your prescriptions, schedule appointments, and more.  How Do I Sign Up? 1. In your Internet browser, go to [email or web address] 2. Click on the New  User? link in the Sign In box.  3. Enter your MyChart Access Code exactly as it appears below. You will not need to use this code after you have completed the sign-up process. If you do not sign up before the expiration date, you must request a new code. MyChart Access Code: [id number] Expires: 03/20/2012  9:38 AM  4. Enter the last four digits of your Social Security Number (xxxx) and Date of Birth (mm/dd/yyyy) as indicated and click Next. You will be taken to the next sign-up page. 5. Create a MyChart ID. This will be your MyChart login ID and cannot be changed, so think of one that is secure and easy to remember. 6. Create a MyChart password. You can change your password at any time. 7. Enter your Password Reset Question and Answer and click Next. This can be used at a later time if you forget your password.  8. Select your communication preference, and if applicable enter your e-mail address. You will receive e-mail notification when new information is available in MyChart by choosing to receive e-mail notifications and filling in your e-mail. 9. Click Sign In. You can now view your medical record.   Additional Information If you have questions, you can email [email or web address] or call [phone] to talk to our MyChart staff. Remember, MyChart is NOT to be used for urgent needs. For medical emergencies, dial 911.      Return to clinic immediately if symptoms worsen or persist or new concerns.  Return if symptoms worsen or fail to improve.  Kriste Basque R.

## 2012-02-19 NOTE — Patient Instructions (Signed)
-  can use tylenol or tramadol as directed  -use rice pillow heat as needed and capsaicin patch as directed  -follow up with your doctor if worsening, new symptoms, skin rash or worsening or not improving over next several weeks  Thank you for enrolling in MyChart. Please follow the instructions below to securely access your online medical record. MyChart allows you to send messages to your doctor, view your test results, renew your prescriptions, schedule appointments, and more.  How Do I Sign Up? 1. In your Internet browser, go to [email or web address] 2. Click on the New  User? link in the Sign In box.  3. Enter your MyChart Access Code exactly as it appears below. You will not need to use this code after you have completed the sign-up process. If you do not sign up before the expiration date, you must request a new code. MyChart Access Code: [email or web address] Expires: 03/20/2012  9:38 AM  4. Enter the last four digits of your Social Security Number (xxxx) and Date of Birth (mm/dd/yyyy) as indicated and click Next. You will be taken to the next sign-up page. 5. Create a MyChart ID. This will be your MyChart login ID and cannot be changed, so think of one that is secure and easy to remember. 6. Create a MyChart password. You can change your password at any time. 7. Enter your Password Reset Question and Answer and click Next. This can be used at a later time if you forget your password.  8. Select your communication preference, and if applicable enter your e-mail address. You will receive e-mail notification when new information is available in MyChart by choosing to receive e-mail notifications and filling in your e-mail. 9. Click Sign In. You can now view your medical record.   Additional Information If you have questions, you can email [email or web address] or call [phone] to talk to our MyChart staff. Remember, MyChart is NOT to be used for urgent needs.  For medical emergencies, dial 911.

## 2012-03-05 ENCOUNTER — Encounter: Payer: Self-pay | Admitting: Internal Medicine

## 2012-03-05 ENCOUNTER — Other Ambulatory Visit: Payer: Self-pay | Admitting: Internal Medicine

## 2012-03-05 ENCOUNTER — Ambulatory Visit (INDEPENDENT_AMBULATORY_CARE_PROVIDER_SITE_OTHER): Payer: Medicare Other | Admitting: Internal Medicine

## 2012-03-05 VITALS — BP 138/82 | HR 88 | Temp 98.1°F | Wt 133.5 lb

## 2012-03-05 DIAGNOSIS — M549 Dorsalgia, unspecified: Secondary | ICD-10-CM

## 2012-03-05 DIAGNOSIS — E785 Hyperlipidemia, unspecified: Secondary | ICD-10-CM

## 2012-03-05 DIAGNOSIS — Z23 Encounter for immunization: Secondary | ICD-10-CM

## 2012-03-05 DIAGNOSIS — I10 Essential (primary) hypertension: Secondary | ICD-10-CM

## 2012-03-05 NOTE — Assessment & Plan Note (Signed)
Continue lovastatin 

## 2012-03-05 NOTE — Assessment & Plan Note (Signed)
Repeat bp 138/82

## 2012-03-08 MED ORDER — TRAMADOL HCL 50 MG PO TABS: 50.0000 mg | ORAL_TABLET | Freq: Four times a day (QID) | ORAL | Status: DC | PRN

## 2012-03-08 NOTE — Progress Notes (Signed)
Patient ID: Vanessa Mason, female   DOB: 06/28/1917, 78 y.o.   MRN: [id number] Comes in with son She feels well except for chronic back pain-- uses tramadol with results  htn- tolerating meds  Lipids-- long term statin use  Past Medical History  Diagnosis Date  . BACK PAIN 03/08/2007  . DEPRESSIVE DISORDER 12/01/2008  . HIATAL HERNIA 12/17/2006  . HYPERLIPIDEMIA 03/09/2006  . HYPERTENSION 03/09/2006  . KNEE PAIN 05/06/2009    History   Social History  . Marital Status: Widowed    Spouse Name: N/A    Number of Children: N/A  . Years of Education: N/A   Occupational History  . Not on file.   Social History Main Topics  . Smoking status: Never Smoker   . Smokeless tobacco: Not on file  . Alcohol Use: Not on file  . Drug Use: Not on file  . Sexually Active: Not on file   Other Topics Concern  . Not on file   Social History Narrative  . No narrative on file    Past Surgical History  Procedure Date  . Abdominal hysterectomy   . Oophorectomy   . Thyroidectomy   . Rotator cuff repair   . Laminectomy     with fusion  . Cardiac catheterization     No family history on file.  Allergies  Allergen Reactions  . Naproxen     Gi bleed  . Nsaids     Current Outpatient Prescriptions on File Prior to Visit  Medication Sig Dispense Refill  . acetaminophen (TYLENOL) 650 MG CR tablet Take 650 mg by mouth 4 (four) times daily.       . Calcium Carbonate-Vitamin D (CALTRATE 600+D) 600-400 MG-UNIT per tablet Take 2 tablets by mouth daily.       . Cholecalciferol (VITAMIN D) 2000 UNITS CAPS Take by mouth.        . cyclobenzaprine (FLEXERIL) 10 MG tablet TAKE 1/2 TABLET BY MOUTH AT   BEDTIME  15 tablet  4  . KRILL OIL 1000 MG CAPS Take by mouth.        . Lutein 20 MG CAPS Take by mouth daily.        . Multiple Vitamin (MULTIVITAMIN) tablet Take 1 tablet by mouth daily.        . traMADol (ULTRAM) 50 MG tablet TAKE 1 TABLET (50 MG TOTAL) BY MOUTH EVERY 8 (EIGHT) HOURS AS  NEEDEDFOR  PAIN.  90 tablet  1  . hydrochlorothiazide (HYDRODIURIL) 25 MG tablet TAKE 1 TABLET (25 MG TOTAL) BY MOUTH DAILY.  30 tablet  4  . lovastatin (MEVACOR) 40 MG tablet TAKE ONE TABLET BY MOUTH ONE    TIME DAILY  30 tablet  1     patient denies chest pain, shortness of breath, orthopnea. Denies lower extremity edema, abdominal pain, change in appetite, change in bowel movements. Patient denies rashes, musculoskeletal complaints. No other specific complaints in a complete review of systems.   BP 138/82  Pulse 88  Temp 98.1 F (36.7 C) (Oral)  Wt 133 lb 8 oz (60.555 kg)  Well-developed well-nourished female in no acute distress. HEENT exam atraumatic, normocephalic, extraocular muscles are intact. Neck is supple. No jugular venous distention no thyromegaly. Chest clear to auscultation without increased work of breathing. Cardiac exam S1 and S2 are regular. Abdominal exam active bowel sounds, soft, nontender. Extremities no edema. Neurologic exam she is alert

## 2012-03-08 NOTE — Assessment & Plan Note (Signed)
Ok to try tramadol qid

## 2012-03-11 ENCOUNTER — Telehealth: Payer: Self-pay | Admitting: Internal Medicine

## 2012-03-11 DIAGNOSIS — M549 Dorsalgia, unspecified: Secondary | ICD-10-CM

## 2012-03-11 NOTE — Telephone Encounter (Signed)
Patient needs to reorder Tramadol 50 mg tabs - Patient wants to take it four times per day instead of 3 times per day.  [facility] is [facility].

## 2012-03-12 MED ORDER — TRAMADOL HCL 50 MG PO TABS: 50.0000 mg | ORAL_TABLET | Freq: Four times a day (QID) | ORAL | Status: DC | PRN

## 2012-03-12 NOTE — Telephone Encounter (Signed)
rx sent in electronically 

## 2012-03-12 NOTE — Telephone Encounter (Signed)
Tramadol 50 mg qid prn back pain. #120/ 6 refills

## 2012-05-16 ENCOUNTER — Other Ambulatory Visit: Payer: Self-pay | Admitting: Internal Medicine

## 2012-05-17 ENCOUNTER — Other Ambulatory Visit: Payer: Self-pay | Admitting: *Deleted

## 2012-05-17 MED ORDER — LOVASTATIN 40 MG PO TABS: 40.0000 mg | ORAL_TABLET | Freq: Every day | ORAL | Status: DC

## 2012-07-08 ENCOUNTER — Ambulatory Visit: Payer: Medicare Other | Admitting: Internal Medicine

## 2012-07-19 ENCOUNTER — Ambulatory Visit: Payer: Medicare Other | Admitting: Internal Medicine

## 2012-07-26 ENCOUNTER — Ambulatory Visit: Payer: Medicare Other | Admitting: Internal Medicine

## 2012-07-31 NOTE — Progress Notes (Signed)
Patient ID: Vanessa Mason, female   DOB: 1917-10-07, 79 y.o.   MRN: [id number] Remarkably healthy 79 yo She does quite well  htn- tolerating meds  Lipids- chronic use of lovastatin  Chronic back pain-- she manages quite well  Reviewed pmh, psh, sochx, meds   patient denies chest pain, shortness of breath, orthopnea. Denies lower extremity edema, abdominal pain, change in appetite, change in bowel movements. Patient denies rashes, musculoskeletal complaints. No other specific complaints in a complete review of systems.    Well-developed well-nourished female in no acute distress. HEENT exam atraumatic, normocephalic, extraocular muscles are intact. Neck is supple. No jugular venous distention no thyromegaly. Chest clear to auscultation without increased work of breathing. Cardiac exam S1 and S2 are regular. Abdominal exam active bowel sounds, soft, nontender. Extremities no edema. Neurologic exam she is alert without any motor sensory deficits. Gait is normal.

## 2012-08-01 ENCOUNTER — Encounter: Payer: Self-pay | Admitting: Internal Medicine

## 2012-08-01 ENCOUNTER — Ambulatory Visit (INDEPENDENT_AMBULATORY_CARE_PROVIDER_SITE_OTHER): Payer: Medicare HMO | Admitting: Internal Medicine

## 2012-08-01 VITALS — BP 160/90 | HR 92 | Temp 97.8°F | Wt 133.0 lb

## 2012-08-01 DIAGNOSIS — E785 Hyperlipidemia, unspecified: Secondary | ICD-10-CM

## 2012-08-01 DIAGNOSIS — I10 Essential (primary) hypertension: Secondary | ICD-10-CM

## 2012-08-01 LAB — HEPATIC FUNCTION PANEL
ALT: 19 U/L (ref 0–35)
AST: 21 U/L (ref 0–37)
Albumin: 4.2 g/dL (ref 3.5–5.2)
Alkaline Phosphatase: 52 U/L (ref 39–117)
Bilirubin, Direct: 0.2 mg/dL (ref 0.0–0.3)
Total Bilirubin: 1.2 mg/dL (ref 0.3–1.2)
Total Protein: 6.7 g/dL (ref 6.0–8.3)

## 2012-08-01 LAB — BASIC METABOLIC PANEL
BUN: 22 mg/dL (ref 6–23)
CO2: 29 mEq/L (ref 19–32)
Calcium: 9.5 mg/dL (ref 8.4–10.5)
Chloride: 101 mEq/L (ref 96–112)
Creatinine, Ser: 0.6 mg/dL (ref 0.4–1.2)
GFR: 92.05 mL/min (ref >60.00)
Glucose, Bld: 91 mg/dL (ref 70–99)
Potassium: 3.7 mEq/L (ref 3.5–5.1)
Sodium: 140 mEq/L (ref 135–145)

## 2012-08-01 LAB — LIPID PANEL
Cholesterol: 180 mg/dL (ref 0–200)
HDL: 65 mg/dL (ref >39.00)
LDL Cholesterol: 86 mg/dL (ref 0–99)
Total CHOL/HDL Ratio: 3
Triglycerides: 143 mg/dL (ref 0.0–149.0)
VLDL: 28.6 mg/dL (ref 0.0–40.0)

## 2012-08-02 NOTE — Assessment & Plan Note (Signed)
longterm lovastatin use. Continue same

## 2012-08-02 NOTE — Assessment & Plan Note (Signed)
?   Control  She will have bp checked by nurse at [facility]  Continue same meds

## 2012-08-27 ENCOUNTER — Other Ambulatory Visit: Payer: Self-pay | Admitting: Internal Medicine

## 2012-10-03 ENCOUNTER — Ambulatory Visit: Payer: Medicare HMO | Admitting: Internal Medicine

## 2012-10-18 ENCOUNTER — Telehealth: Payer: Self-pay | Admitting: Internal Medicine

## 2012-10-18 NOTE — Telephone Encounter (Signed)
[employer] contacted me because pt requested prior auth on cyclobenzaprine. I show she's been on it a long time. Rx ran our 08/01/12. Prior auth approved - not sure if she needs a new rx? Pt has not called [place]. Thanks.

## 2012-10-18 NOTE — Telephone Encounter (Signed)
Medicine was removed from list on 08/09/12 with d/c reason of no longer needed. Will wait till pt request rx

## 2012-10-23 ENCOUNTER — Other Ambulatory Visit: Payer: Self-pay | Admitting: Internal Medicine

## 2012-11-01 ENCOUNTER — Other Ambulatory Visit: Payer: Self-pay | Admitting: *Deleted

## 2012-11-01 MED ORDER — CYCLOBENZAPRINE HCL 10 MG PO TABS: ORAL_TABLET | ORAL | Status: DC

## 2012-11-07 ENCOUNTER — Ambulatory Visit (INDEPENDENT_AMBULATORY_CARE_PROVIDER_SITE_OTHER): Payer: Medicare HMO | Admitting: Family Medicine

## 2012-11-07 ENCOUNTER — Encounter: Payer: Self-pay | Admitting: Family Medicine

## 2012-11-07 VITALS — BP 142/80 | Temp 98.1°F | Wt 134.0 lb

## 2012-11-07 DIAGNOSIS — G8929 Other chronic pain: Secondary | ICD-10-CM

## 2012-11-07 DIAGNOSIS — R51 Headache: Secondary | ICD-10-CM

## 2012-11-07 NOTE — Progress Notes (Signed)
Chief Complaint  Patient presents with  . Headache    for 11 hours yesterday; left at bedtime; first time lasted this long     HPI:  79 yo pt of doctor swords here for acute visit for HA: -had a HA yesterday - L temporal area, can't remember when it started - she did all activities yesterday - exercise, bridge game and went to dinner, but this was nagging -she has headaches from time to time chronically for years, gets a few times per year maybe - usually doesn't last very long -yesterday ha lasted for hours, gone today -denies: nausea, vision changes, vomiting, light sensitivity, weakness, malaise -she did take tylenol, but she takes this every day   ROS: See pertinent positives and negatives per HPI.  Past Medical History  Diagnosis Date  . BACK PAIN 03/08/2007  . DEPRESSIVE DISORDER 12/01/2008  . HIATAL HERNIA 12/17/2006  . HYPERLIPIDEMIA 03/09/2006  . HYPERTENSION 03/09/2006  . KNEE PAIN 05/06/2009    No family history on file.  History   Social History  . Marital Status: Widowed    Spouse Name: N/A    Number of Children: N/A  . Years of Education: N/A   Social History Main Topics  . Smoking status: Never Smoker   . Smokeless tobacco: None  . Alcohol Use: None  . Drug Use: None  . Sexually Active: None   Other Topics Concern  . None   Social History Narrative  . None    Current outpatient prescriptions:acetaminophen (TYLENOL) 650 MG CR tablet, Take 650 mg by mouth 4 (four) times daily. , Disp: , Rfl: ;  Calcium Carbonate-Vitamin D (CALTRATE 600+D) 600-400 MG-UNIT per tablet, Take 2 tablets by mouth daily. , Disp: , Rfl: ;  Cholecalciferol (VITAMIN D) 2000 UNITS CAPS, Take by mouth.  , Disp: , Rfl: ;  cyclobenzaprine (FLEXERIL) 10 MG tablet, TAKE 1/2 TABLET BY MOUTH AT   BEDTIME, Disp: 15 tablet, Rfl: 3 hydrochlorothiazide (HYDRODIURIL) 25 MG tablet, TAKE 1 TABLET (25 MG TOTAL) BY MOUTH DAILY., Disp: 30 tablet, Rfl: 3;  KRILL OIL 1000 MG CAPS, Take by mouth.  ,  Disp: , Rfl: ;  lovastatin (MEVACOR) 40 MG tablet, TAKE 1 TABLET (40 MG TOTAL) BY MOUTH DAILY., Disp: 30 tablet, Rfl: 0;  Lutein 20 MG CAPS, Take by mouth daily.  , Disp: , Rfl: ;  Multiple Vitamin (MULTIVITAMIN) tablet, Take 2 tablets by mouth daily. , Disp: , Rfl:   EXAM:  Filed Vitals:   11/07/12 0804  BP: 142/80  Temp: 98.1 F (36.7 C)    Body mass index is 26.17 kg/(m^2).  GENERAL: vitals reviewed and listed above, alert, oriented, appears well hydrated and in no acute distress  HEENT: atraumatic, conjunttiva clear, no obvious abnormalities on inspection of external nose and ears, no torturous temporal art or TA bruits or TTP  NECK: no obvious masses on inspection  LUNGS: clear to auscultation bilaterally, no wheezes, rales or rhonchi, good air movement  CV: HRRR, no peripheral edema  MS: moves all extremities without noticeable abnormality  PSYCH: pleasant and cooperative, no obvious depression or anxiety  NEURO: PERRLA, CN II-XII grossly intact, normal gait, finger to nose normal  ASSESSMENT AND PLAN:  Discussed the following assessment and plan:  Chronic headaches  -she looks great today with all symptoms resolved and no abnormal neurological orTA findings on exam. She has has headaches intermittently for years and I suspect a neurogenic or migraine type headache. Discussed options, but given occur  so rarely she will keep HA journal and follow up with PCP.  -Patient advised to return or notify a doctor immediately if symptoms worsen or persist or new concerns arise.  Patient Instructions  -keep a headache journal  -follow up with your doctor in July as scheduled and bring headache journal     Kriste Basque R.

## 2012-11-07 NOTE — Patient Instructions (Signed)
-  keep a headache journal  -follow up with your doctor in July as scheduled and bring headache journal

## 2012-11-08 ENCOUNTER — Ambulatory Visit: Payer: Medicare HMO | Admitting: Internal Medicine

## 2012-11-26 ENCOUNTER — Other Ambulatory Visit: Payer: Self-pay | Admitting: Internal Medicine

## 2012-12-11 ENCOUNTER — Encounter: Payer: Self-pay | Admitting: Internal Medicine

## 2012-12-11 ENCOUNTER — Ambulatory Visit (INDEPENDENT_AMBULATORY_CARE_PROVIDER_SITE_OTHER): Payer: Medicare HMO | Admitting: Internal Medicine

## 2012-12-11 VITALS — BP 142/90 | HR 96 | Temp 98.0°F | Wt 132.0 lb

## 2012-12-11 DIAGNOSIS — I10 Essential (primary) hypertension: Secondary | ICD-10-CM

## 2012-12-11 DIAGNOSIS — R51 Headache: Secondary | ICD-10-CM

## 2012-12-11 DIAGNOSIS — E785 Hyperlipidemia, unspecified: Secondary | ICD-10-CM

## 2012-12-11 DIAGNOSIS — K922 Gastrointestinal hemorrhage, unspecified: Secondary | ICD-10-CM

## 2012-12-11 DIAGNOSIS — Z0000 Encounter for general adult medical examination without abnormal findings: Secondary | ICD-10-CM

## 2012-12-11 DIAGNOSIS — Z23 Encounter for immunization: Secondary | ICD-10-CM

## 2012-12-11 LAB — LIPID PANEL
Cholesterol: 173 mg/dL (ref 0–200)
HDL: 65.4 mg/dL (ref >39.00)
LDL Cholesterol: 88 mg/dL (ref 0–99)
Total CHOL/HDL Ratio: 3
Triglycerides: 96 mg/dL (ref 0.0–149.0)
VLDL: 19.2 mg/dL (ref 0.0–40.0)

## 2012-12-11 LAB — HEPATIC FUNCTION PANEL
ALT: 17 U/L (ref 0–35)
AST: 24 U/L (ref 0–37)
Albumin: 4.2 g/dL (ref 3.5–5.2)
Alkaline Phosphatase: 52 U/L (ref 39–117)
Bilirubin, Direct: 0.1 mg/dL (ref 0.0–0.3)
Total Bilirubin: 0.9 mg/dL (ref 0.3–1.2)
Total Protein: 6.8 g/dL (ref 6.0–8.3)

## 2012-12-11 LAB — SEDIMENTATION RATE: Sed Rate: 7 mm/hr (ref 0–22)

## 2012-12-11 NOTE — Progress Notes (Addendum)
Patient ID: Vanessa Mason, female   DOB: 1917/11/30, 79 y.o.   MRN: [id number]  Chronic back pain- uses flexeril with great results  Shooting headache- intermittent and none in 10 days- left sided temple and left eye. Symptoms have been ongoing for years. Symptoms typically flareup for one or 2 weeks and then resolve for several months or years.  Lipids- tolerating meds  Reviewed pmh, psh, sochx  Reviewed meds   patient denies chest pain, shortness of breath, orthopnea. Denies lower extremity edema, abdominal pain, change in appetite, change in bowel movements. Patient denies rashes, musculoskeletal complaints. No other specific complaints in a complete review of systems.    Well-developed well-nourished female in no acute distress. HEENT exam atraumatic, normocephalic, extraocular muscles are intact. Neck is supple. No jugular venous distention no thyromegaly. Chest clear to auscultation without increased work of breathing. Cardiac exam S1 and S2 are regular. Abdominal exam active bowel sounds, soft, nontender. Extremities no edema. Neurologic exam she is alert without any motor sensory deficits. Gait is normal.   Headache. Check esr- could be trig neuralgia  Lipids- check labs

## 2012-12-24 ENCOUNTER — Other Ambulatory Visit: Payer: Self-pay | Admitting: *Deleted

## 2012-12-24 MED ORDER — HYDROCHLOROTHIAZIDE 25 MG PO TABS: ORAL_TABLET | ORAL | Status: DC

## 2013-01-28 ENCOUNTER — Ambulatory Visit: Payer: Medicare HMO | Admitting: Internal Medicine

## 2013-02-24 ENCOUNTER — Other Ambulatory Visit: Payer: Self-pay | Admitting: Internal Medicine

## 2013-03-21 ENCOUNTER — Ambulatory Visit (INDEPENDENT_AMBULATORY_CARE_PROVIDER_SITE_OTHER): Payer: Medicare HMO

## 2013-03-21 DIAGNOSIS — Z23 Encounter for immunization: Secondary | ICD-10-CM

## 2013-05-27 ENCOUNTER — Other Ambulatory Visit: Payer: Self-pay | Admitting: *Deleted

## 2013-05-27 MED ORDER — LOVASTATIN 40 MG PO TABS: ORAL_TABLET | ORAL | Status: DC

## 2013-05-27 MED ORDER — CYCLOBENZAPRINE HCL 10 MG PO TABS: ORAL_TABLET | ORAL | Status: DC

## 2013-06-17 ENCOUNTER — Ambulatory Visit (INDEPENDENT_AMBULATORY_CARE_PROVIDER_SITE_OTHER): Payer: Medicare HMO | Admitting: Internal Medicine

## 2013-06-17 ENCOUNTER — Encounter: Payer: Self-pay | Admitting: Internal Medicine

## 2013-06-17 VITALS — BP 144/90 | HR 80 | Temp 98.0°F | Ht 60.0 in | Wt 132.5 lb

## 2013-06-17 DIAGNOSIS — IMO0002 Reserved for concepts with insufficient information to code with codable children: Secondary | ICD-10-CM

## 2013-06-17 DIAGNOSIS — I10 Essential (primary) hypertension: Secondary | ICD-10-CM

## 2013-06-17 DIAGNOSIS — M549 Dorsalgia, unspecified: Secondary | ICD-10-CM

## 2013-06-17 DIAGNOSIS — M1712 Unilateral primary osteoarthritis, left knee: Secondary | ICD-10-CM | POA: Insufficient documentation

## 2013-06-17 DIAGNOSIS — E785 Hyperlipidemia, unspecified: Secondary | ICD-10-CM

## 2013-06-17 DIAGNOSIS — M1710 Unilateral primary osteoarthritis, unspecified knee: Secondary | ICD-10-CM

## 2013-06-17 NOTE — Progress Notes (Signed)
Her main problem has been back pain- she has been seeing a physical therapist and has had significant relief. She has cut tylenol in 1/2 (at least).  She admits to some knee pain- has seen ortho (advised knee replacement). Vanessa Mason.   HTN and lipids (tolerating meds)  Past Medical History  Diagnosis Date  . BACK PAIN 03/08/2007  . DEPRESSIVE DISORDER 12/01/2008  . HIATAL HERNIA 12/17/2006  . HYPERLIPIDEMIA 03/09/2006  . HYPERTENSION 03/09/2006  . KNEE PAIN 05/06/2009    History   Social History  . Marital Status: Widowed    Spouse Name: N/A    Number of Children: N/A  . Years of Education: N/A   Occupational History  . Not on file.   Social History Main Topics  . Smoking status: Never Smoker   . Smokeless tobacco: Not on file  . Alcohol Use: Not on file  . Drug Use: Not on file  . Sexual Activity: Not on file   Other Topics Concern  . Not on file   Social History Narrative  . No narrative on file    Past Surgical History  Procedure Laterality Date  . Abdominal hysterectomy    . Oophorectomy    . Thyroidectomy    . Rotator cuff repair    . Laminectomy      with fusion  . Cardiac catheterization      No family history on file.  Allergies  Allergen Reactions  . Naproxen     Gi bleed  . Nsaids     Current Outpatient Prescriptions on File Prior to Visit  Medication Sig Dispense Refill  . acetaminophen (TYLENOL) 650 MG CR tablet Take 650 mg by mouth. 1-2 times once day      . Calcium-Magnesium (CALCIUM MAGNESIUM 750) 300-300 MG TABS Take 2 tablets by mouth daily.       . Cholecalciferol (VITAMIN D) 2000 UNITS CAPS Take by mouth.        . cyclobenzaprine (FLEXERIL) 10 MG tablet TAKE 1/2 TABLET BY MOUTH AT   BEDTIME  15 tablet  2  . hydrochlorothiazide (HYDRODIURIL) 25 MG tablet TAKE 1 TABLET (25 MG TOTAL) BY MOUTH DAILY.  30 tablet  11  . KRILL OIL 1000 MG CAPS Take 2 capsules by mouth daily.       Marland Kitchen lovastatin (MEVACOR) 40 MG tablet TAKE 1 TABLET (40 MG  TOTAL) BY MOUTH DAILY.  30 tablet  5  . Lutein 20 MG CAPS Take by mouth daily.        . Multiple Vitamin (MULTIVITAMIN) tablet Take 2 tablets by mouth daily.        No current facility-administered medications on file prior to visit.     patient denies chest pain, shortness of breath, orthopnea. Denies lower extremity edema, abdominal pain, change in appetite, change in bowel movements. Patient denies rashes, musculoskeletal complaints. No other specific complaints in a complete review of systems.   BP 162/84  Pulse 80  Temp(Src) 98 F (36.7 C) (Oral)  Ht 5' (1.524 m)  Wt 132 lb 8 oz (60.102 kg)  BMI 25.88 kg/m2 Remarkably healthy appearing female who looks and performs much younger than her stated age! in no acute distress. HEENT exam atraumatic, normocephalic, extraocular muscles are intact. Neck is supple. No jugular venous distention no thyromegaly. Chest clear to auscultation without increased work of breathing. Cardiac exam S1 and S2 are regular. Abdominal exam active bowel sounds, soft, nontender. Extremities no edema. Neurologic exam she is alert without  any motor sensory deficits. Gait is normal.

## 2013-06-17 NOTE — Progress Notes (Signed)
Pre visit review using our clinic review tool, if applicable. No additional management support is needed unless otherwise documented below in the visit note. 

## 2013-06-17 NOTE — Assessment & Plan Note (Signed)
She will talk with ortho

## 2013-06-17 NOTE — Assessment & Plan Note (Signed)
Lipid Panel     Component Value Date/Time   CHOL 173 12/11/2012 0847   TRIG 96.0 12/11/2012 0847   HDL 65.40 12/11/2012 0847   CHOLHDL 3 12/11/2012 0847   VLDL 19.2 12/11/2012 0847   LDLCALC 88 12/11/2012 0847  previously controlled No need for labs

## 2013-06-17 NOTE — Assessment & Plan Note (Signed)
Much better 

## 2013-06-17 NOTE — Assessment & Plan Note (Signed)
She will monitor at home

## 2013-07-02 ENCOUNTER — Telehealth: Payer: Self-pay | Admitting: Internal Medicine

## 2013-07-02 NOTE — Telephone Encounter (Signed)
PA has been approved for pt's Cyclobenzaprine 10 mg tablets.  Approval dates: 05/29/13-05/28/14; GQB#[id number].  Pt is aware.

## 2013-07-02 NOTE — Telephone Encounter (Signed)
Relevant patient education assigned to patient using Emmi. ° °

## 2013-07-03 MED ORDER — CYCLOBENZAPRINE HCL 10 MG PO TABS: ORAL_TABLET | ORAL | Status: DC

## 2013-07-03 NOTE — Telephone Encounter (Signed)
rx sent in electronically 

## 2013-07-07 ENCOUNTER — Ambulatory Visit: Payer: Medicare HMO | Admitting: Family Medicine

## 2013-07-30 ENCOUNTER — Encounter: Payer: Self-pay | Admitting: Family Medicine

## 2013-07-30 ENCOUNTER — Ambulatory Visit (INDEPENDENT_AMBULATORY_CARE_PROVIDER_SITE_OTHER): Payer: Medicare HMO | Admitting: Family Medicine

## 2013-07-30 VITALS — BP 140/80 | HR 86 | Temp 98.1°F | Wt 133.0 lb

## 2013-07-30 DIAGNOSIS — I447 Left bundle-branch block, unspecified: Secondary | ICD-10-CM | POA: Insufficient documentation

## 2013-07-30 DIAGNOSIS — Z01818 Encounter for other preprocedural examination: Secondary | ICD-10-CM

## 2013-07-30 DIAGNOSIS — I10 Essential (primary) hypertension: Secondary | ICD-10-CM

## 2013-07-30 DIAGNOSIS — E785 Hyperlipidemia, unspecified: Secondary | ICD-10-CM

## 2013-07-30 NOTE — Patient Instructions (Signed)
We will call you with cardiology referral.

## 2013-07-30 NOTE — Progress Notes (Signed)
Pre visit review using our clinic review tool, if applicable. No additional management support is needed unless otherwise documented below in the visit note. 

## 2013-07-30 NOTE — Progress Notes (Signed)
   Subjective:    Patient ID: Vanessa Mason, female    DOB: Jan 22, 1918, 80 y.o.   MRN: [id number]  HPI Patient seen for anticipated upcoming total knee replacement. She has history of hypertension treated with HCTZ and hyperlipidemia treated with lovastatin. No history of CAD. No history of stroke. She's been very healthy. She smoked only briefly from from age [age]. Denies any recent dyspnea. No chest pains. No dizziness.  She's had some fairly severe eczema issues involving her upper trunk and back region as well as lower legs. She was prescribed steroid cream per dermatologist couple weeks go which has not helped.  Past Medical History  Diagnosis Date  . BACK PAIN 03/08/2007  . DEPRESSIVE DISORDER 12/01/2008  . HIATAL HERNIA 12/17/2006  . HYPERLIPIDEMIA 03/09/2006  . HYPERTENSION 03/09/2006  . KNEE PAIN 05/06/2009  . GI bleed 09/12/2010    Naproxen induced Endoscopy with dr Harlene Ramus    Past Surgical History  Procedure Laterality Date  . Abdominal hysterectomy    . Oophorectomy    . Thyroidectomy    . Rotator cuff repair    . Laminectomy      with fusion  . Cardiac catheterization      reports that she has never smoked. She does not have any smokeless tobacco history on file. Her alcohol and drug histories are not on file. family history is not on file. Allergies  Allergen Reactions  . Naproxen     Gi bleed  . Nsaids       Review of Systems  Constitutional: Negative for fatigue.  Eyes: Negative for visual disturbance.  Respiratory: Negative for cough, chest tightness, shortness of breath and wheezing.   Cardiovascular: Negative for chest pain, palpitations and leg swelling.  Endocrine: Negative for polydipsia and polyuria.  Neurological: Negative for dizziness, seizures, syncope, weakness, light-headedness and headaches.       Objective:   Physical Exam  Constitutional: She appears well-developed and well-nourished. No distress.  HENT:  Mouth/Throat: Oropharynx  is clear and moist.  Neck: Neck supple. No thyromegaly present.  Cardiovascular: Normal rate.  Exam reveals no gallop and no friction rub.   Pulmonary/Chest: Effort normal and breath sounds normal. No respiratory distress. She has no wheezes. She has no rales.  Musculoskeletal: She exhibits no edema.  Skin: Rash noted.  Patient has diffuse nonspecific eczematous type rash involving her upper anterior trunk and upper back region. She has multiple excoriations. No signs of secondary infection          Assessment & Plan:  Presurgical evaluation. Patient looking at upcoming knee replacement. She has no cardiac or cerebrovascular history. Check EKG.   EKG shows sinus rhythm with left bundle branch block. We had old EKG from 2002 which did not reveal and a left bundle branch block. She denies any other EKGs in the interim. Recommend cardiology evaluation prior to any elective knee surgery. Patient agrees

## 2013-09-12 ENCOUNTER — Encounter: Payer: Self-pay | Admitting: Cardiology

## 2013-09-12 ENCOUNTER — Ambulatory Visit (INDEPENDENT_AMBULATORY_CARE_PROVIDER_SITE_OTHER): Payer: Medicare HMO | Admitting: Cardiology

## 2013-09-12 VITALS — BP 143/75 | HR 97 | Ht 61.0 in | Wt 124.0 lb

## 2013-09-12 DIAGNOSIS — M791 Myalgia, unspecified site: Secondary | ICD-10-CM

## 2013-09-12 DIAGNOSIS — IMO0001 Reserved for inherently not codable concepts without codable children: Secondary | ICD-10-CM

## 2013-09-12 DIAGNOSIS — M1990 Unspecified osteoarthritis, unspecified site: Secondary | ICD-10-CM

## 2013-09-12 DIAGNOSIS — I447 Left bundle-branch block, unspecified: Secondary | ICD-10-CM

## 2013-09-12 NOTE — Progress Notes (Signed)
Vanessa Mason Date of Birth:  01-Oct-1917 [address] [place], [place]  [address] [phone]         Fax   [phone]  History of Present Illness: This 80 year old Caucasian female is seen by me as a patient for the first time today.  She is seen at the request of Dr. Carolann Littler.  The patient has a history of osteoarthritis and is considering right total knee replacement.  Dr. Vickey Huger is her orthopedist.  She was seen recently by Dr. Elease Hashimoto and was found to have a left bundle branch block pattern on her EKG.  She had had a previous EKG in 2002 which did not reveal any left bundle-branch block.  She is not aware of any other EKGs in the interval.  The patient denies any symptoms of chest pain or shortness of breath.  She has not had any problems with dizziness or syncope.  She has had some problems with her memory recently according to the patient and according to the patient's daughter.  She has also had some myalgias.  She's had a past history of significant hypercholesterolemia and is presently taking lovastatin 40 mg daily.  She has a past history of high blood pressure.  In the past she was on amlodipine which was stopped because of peripheral edema.  At the present time she takes only hydrochlorothiazide 25 mg daily for her blood pressure.  She does not have any history of congestive heart failure.  Current Outpatient Prescriptions  Medication Sig Dispense Refill  . acetaminophen (TYLENOL) 650 MG CR tablet Take 650 mg by mouth. 1-2 times once day      . Calcium-Magnesium (CALCIUM MAGNESIUM 750) 300-300 MG TABS Take 2 tablets by mouth daily.       . Cholecalciferol (VITAMIN D) 2000 UNITS CAPS Take by mouth.        . cyclobenzaprine (FLEXERIL) 10 MG tablet TAKE 1/2 TABLET BY MOUTH AT   BEDTIME  15 tablet  2  . hydrochlorothiazide (HYDRODIURIL) 25 MG tablet TAKE 1 TABLET (25 MG TOTAL) BY MOUTH DAILY.  30 tablet  11  . KRILL OIL 1000 MG CAPS Take 2  capsules by mouth daily.       . Lutein 20 MG CAPS Take by mouth daily.        . Multiple Vitamin (MULTIVITAMIN) tablet Take 2 tablets by mouth daily.        No current facility-administered medications for this visit.    Allergies  Allergen Reactions  . Naproxen     Gi bleed  . Nsaids     Patient Active Problem List   Diagnosis Date Noted  . LBBB (left bundle branch block) 07/30/2013  . Osteoarthritis of left knee 06/17/2013  . Hyperlipemia 09/12/2010  . BACK PAIN 03/08/2007  . HIATAL HERNIA 12/17/2006  . HYPERTENSION 03/09/2006    History  Smoking status  . Never Smoker   Smokeless tobacco  . Not on file    History  Alcohol Use: Not on file    Family history reveals that her mother died at age 78 of ovarian cancer.  Her father died at age 5 of western equine encephalitis  Review of Systems: Constitutional: no fever chills diaphoresis or fatigue or change in weight.  Head and neck: no hearing loss, no epistaxis, no photophobia or visual disturbance. Respiratory: No cough, shortness of breath or wheezing. Cardiovascular: No chest pain peripheral edema, palpitations. Gastrointestinal: No abdominal distention, no  abdominal pain, no change in bowel habits hematochezia or melena. Genitourinary: No dysuria, no frequency, no urgency, no nocturia. Musculoskeletal: Positive for myalgias Neurological: No dizziness, no headaches, no numbness, no seizures, no syncope, no weakness, no tremors. Hematologic: No lymphadenopathy, no easy bruising. Psychiatric: No confusion, no hallucinations, no sleep disturbance.  The patient feels that she has had some recent impairment of her memory.    Physical Exam: Filed Vitals:   09/12/13 1427  BP: 143/75  Pulse: 97   the general appearance reveals a very delightful elderly woman in no distress.  She is here with her daughter who lives in [place].The head and neck exam reveals pupils equal and reactive.  Extraocular movements  are full.  There is no scleral icterus.  The mouth and pharynx are normal.  The neck is supple.  The carotids reveal no bruits.  The jugular venous pressure is normal.  The  thyroid is not enlarged.  There is no lymphadenopathy.  The chest is clear to percussion and auscultation.  There are no rales or rhonchi.  Expansion of the chest is symmetrical.  The precordium is quiet.  The first heart sound is normal.  The second heart sound is physiologically split.  There is no murmur gallop rub or click.  There is no abnormal lift or heave.  The abdomen is soft and nontender.  The bowel sounds are normal.  The liver and spleen are not enlarged.  There are no abdominal masses.  There are no abdominal bruits.  Extremities reveal good pedal pulses.  There is no phlebitis or edema.  There is no cyanosis or clubbing.  Strength is normal and symmetrical in all extremities.  There is no lateralizing weakness.  There are no sensory deficits.  The skin is warm and dry.  There is no rash.  EKG from Dr. Elease Hashimoto was reviewed and shows normal sinus rhythm with left bundle branch block.   Assessment / Plan: 1.  Good general health 2.  left bundle branch block, uncertain onset. 3.  Hypertension 4.  Hypercholesterolemia 5. Osteoarthritis  Recommendation: For preop evaluation we will have the patient return for adenosine Myoview stress test to exclude significant myocardial ischemia as a cause of her left bundle branch block. The patient is also complaining of generalized myalgias.  She and her daughter also thinks that she may be having some problems with her memory.  I have suggested that they hold her lovastatin for several months and see if these symptoms improve.  If the symptoms do improve then restarting a lower dose of lovastatin or another statin or possibly ezetimibe could be considered. Many thanks for the opportunity to see this pleasant woman with you.

## 2013-09-12 NOTE — Patient Instructions (Signed)
HOLD YOUR LOVASTATIN   Your physician has requested that you have an adenosine myoview. For further information please visit [email or web address]. Please follow instruction sheet, as given.  Follow up as needed

## 2013-09-24 ENCOUNTER — Encounter: Payer: Self-pay | Admitting: Cardiology

## 2013-09-24 ENCOUNTER — Ambulatory Visit (HOSPITAL_COMMUNITY): Payer: Medicare HMO | Attending: Cardiovascular Disease | Admitting: Radiology

## 2013-09-24 VITALS — BP 191/116 | HR 106 | Ht 61.0 in | Wt 128.0 lb

## 2013-09-24 DIAGNOSIS — I447 Left bundle-branch block, unspecified: Secondary | ICD-10-CM

## 2013-09-24 DIAGNOSIS — Z01810 Encounter for preprocedural cardiovascular examination: Secondary | ICD-10-CM

## 2013-09-24 MED ORDER — TECHNETIUM TC 99M SESTAMIBI GENERIC - CARDIOLITE
33.0000 | Freq: Once | INTRAVENOUS | Status: AC | PRN
  Administered 2013-09-24: 33 via INTRAVENOUS

## 2013-09-24 MED ORDER — ADENOSINE (DIAGNOSTIC) 3 MG/ML IV SOLN
0.5600 mg/kg | Freq: Once | INTRAVENOUS | Status: AC
  Administered 2013-09-24: 32.4 mg via INTRAVENOUS

## 2013-09-24 MED ORDER — TECHNETIUM TC 99M SESTAMIBI GENERIC - CARDIOLITE
11.0000 | Freq: Once | INTRAVENOUS | Status: AC | PRN
  Administered 2013-09-24: 11 via INTRAVENOUS

## 2013-09-24 NOTE — Progress Notes (Signed)
[facility] [address] [place],  [address] [phone]    Cardiology Nuclear Med Study  Vanessa Mason is a 80 y.o. female     MRN : [id number]     DOB: Mar 18, 1918  Procedure Date: 09/24/2013  Nuclear Med Background Indication for Stress Test:  Evaluation for Ischemia, Abnormal EKG (LBBB), and Pending Surgical Clearance for  (R) TKR by Lara Mulch, MD History:  No known CAD, Cath 1998 (normal), Echo 1998 (normal), MPI  (normal) Cardiac Risk Factors: History of Smoking, Hypertension, LBBB and Lipids  Symptoms:  None indicated   Nuclear Pre-Procedure Caffeine/Decaff Intake:  None > 12 hrs NPO After: 9:00pm   Lungs:  clear O2 Sat: 96% on room air. IV 0.9% NS with Angio Cath:  22g  IV Site: R Antecubital x 1, tolerated well IV Started by:  Irven Baltimore, RN  Chest Size (in):  38 Cup Size: C  Height: 5\' 1"  (1.549 m)  Weight:  128 lb (58.06 kg)  BMI:  Body mass index is 24.2 kg/(m^2). Tech Comments:  N/A    Nuclear Med Study 1 or 2 day study: 1 day  Stress Test Type:  Adenosine  Reading MD: N/A  Order Authorizing Provider:  Darlin Coco, MD  Resting Radionuclide: Technetium 3m Sestamibi  Resting Radionuclide Dose: 11.0 mCi   Stress Radionuclide:  Technetium 73m Sestamibi  Stress Radionuclide Dose: 33.0 mCi           Stress Protocol Rest HR: 106 Stress HR: 117  Rest BP: 191/116 Stress BP: 159/79  Exercise Time (min): n/a METS: n/a           Dose of Adenosine (mg):  n/a Dose of Lexiscan: 0.4 mg  Dose of Atropine (mg): n/a Dose of Dobutamine: n/a mcg/kg/min (at max HR)  Stress Test Technologist: Glade Lloyd, BS-ES  Nuclear Technologist:  Annye Rusk, CNMT     Rest Procedure:  Myocardial perfusion imaging was performed at rest 45 minutes following the intravenous administration of Technetium 71m Sestamibi. Rest ECG: NSR-LBBB  Stress Procedure:  The patient received IV Lexiscan 0.4 mg over 15-seconds.  Technetium 93m  Sestamibi injected at 30-seconds.  Quantitative spect images were obtained after a 45 minute delay.  During the infusion of Lexiscan, the patient complained of hot flashes and a headache.  These began to resolve in recovery.  Stress ECG: No significant change from baseline ECG  QPS Raw Data Images:  There is interference from nuclear activity from structures below the diaphragm. This does not affect the ability to read the study. Stress Images:  There is mildly reduced activity in the basal inferolateral wall Rest Images:  Comparison with the stress images reveals no significant change. Subtraction (SDS):  No evidence of ischemia. Transient Ischemic Dilatation (Normal <1.22):  0.92 Lung/Heart Ratio (Normal <0.45):  0.25  Quantitative Gated Spect Images QGS EDV:  106 ml QGS ESV:  68 ml  Impression Exercise Capacity:  Lexiscan with no exercise. BP Response:  Normal blood pressure response. Clinical Symptoms:  No significant symptoms noted. ECG Impression:  Baseline:  LBBB.  EKG uninterpretable due to LBBB at rest and stress. Comparison with Prior Nuclear Study: Previous study described normal perfusion and LVEF.  LV Ejection Fraction: 36%.  LV Wall Motion:  Global LV hypokinesis, paradoxical septal motion related to LBBB and moderately depressed overall LV systolic function.   Overall Impression:  Low risk stress nuclear study with a small and mild fixed basal inferolateral defect.  No reversible ischemia is seen. Findings are most suggestive of nonischemic cardiomyopathy, but balanced ischemia cannot be entirely excluded.Sanda Klein, MD, [facility] [phone] office [phone] pager'

## 2013-09-29 ENCOUNTER — Telehealth: Payer: Self-pay | Admitting: Cardiology

## 2013-09-29 NOTE — Telephone Encounter (Signed)
Message copied by Earvin Hansen on Mon Sep 29, 2013  5:31 PM ------      Message from: Darlin Coco      Created: Fri Sep 26, 2013  7:46 AM       Please report. The stress test was low risk.  No ischemia. Heart is okay for knee surgery if she decides to have it. ------

## 2013-09-29 NOTE — Telephone Encounter (Signed)
New message     Returning Melinda's call. Please call after 4:30.

## 2013-09-29 NOTE — Telephone Encounter (Signed)
Left message to call back  

## 2013-10-01 ENCOUNTER — Encounter (HOSPITAL_COMMUNITY): Payer: Medicare HMO

## 2013-10-03 NOTE — Telephone Encounter (Signed)
Advised patient

## 2013-11-22 ENCOUNTER — Other Ambulatory Visit: Payer: Self-pay | Admitting: Internal Medicine

## 2013-12-15 ENCOUNTER — Ambulatory Visit: Payer: Medicare HMO | Admitting: Internal Medicine

## 2013-12-16 ENCOUNTER — Encounter: Payer: Self-pay | Admitting: Internal Medicine

## 2013-12-16 ENCOUNTER — Ambulatory Visit (INDEPENDENT_AMBULATORY_CARE_PROVIDER_SITE_OTHER): Payer: Medicare HMO | Admitting: Internal Medicine

## 2013-12-16 VITALS — BP 140/70 | HR 92 | Temp 97.9°F | Ht 61.0 in | Wt 129.0 lb

## 2013-12-16 DIAGNOSIS — Z66 Do not resuscitate: Secondary | ICD-10-CM

## 2013-12-16 DIAGNOSIS — I10 Essential (primary) hypertension: Secondary | ICD-10-CM

## 2013-12-16 MED ORDER — LOSARTAN POTASSIUM 50 MG PO TABS: 50.0000 mg | ORAL_TABLET | Freq: Every day | ORAL | Status: DC

## 2013-12-16 NOTE — Progress Notes (Signed)
Pre visit review using our clinic review tool, if applicable. No additional management support is needed unless otherwise documented below in the visit note. 

## 2013-12-16 NOTE — Progress Notes (Signed)
htn- tolerating meds without difficulty  Lipids- no meds  January- developed itching on back and chest- unclear if there was a real rash. She was treated with various creams. Finally treated with prednisone with great results in May. Since then the pruritis has recurred. No new meds She states Tylenol helps relieve the itch.   Past Medical History  Diagnosis Date  . BACK PAIN 03/08/2007  . DEPRESSIVE DISORDER 12/01/2008  . HIATAL HERNIA 12/17/2006  . HYPERLIPIDEMIA 03/09/2006  . HYPERTENSION 03/09/2006  . KNEE PAIN 05/06/2009  . GI bleed 09/12/2010    Naproxen induced Endoscopy with dr Harlene Ramus     History   Social History  . Marital Status: Widowed    Spouse Name: N/A    Number of Children: N/A  . Years of Education: N/A   Occupational History  . Not on file.   Social History Main Topics  . Smoking status: Never Smoker   . Smokeless tobacco: Not on file  . Alcohol Use: Not on file  . Drug Use: Not on file  . Sexual Activity: Not on file   Other Topics Concern  . Not on file   Social History Narrative  . No narrative on file    Past Surgical History  Procedure Laterality Date  . Abdominal hysterectomy    . Oophorectomy    . Thyroidectomy    . Rotator cuff repair    . Laminectomy      with fusion  . Cardiac catheterization      No family history on file.  Allergies  Allergen Reactions  . Naproxen     Gi bleed  . Nsaids     Current Outpatient Prescriptions on File Prior to Visit  Medication Sig Dispense Refill  . acetaminophen (TYLENOL) 650 MG CR tablet Take 650 mg by mouth 3 (three) times daily.       . Calcium-Magnesium (CALCIUM MAGNESIUM 750) 300-300 MG TABS Take 2 tablets by mouth daily.       . Cholecalciferol (VITAMIN D) 2000 UNITS CAPS Take by mouth.        . cyclobenzaprine (FLEXERIL) 10 MG tablet TAKE ONE-HALF TABLET BY MOUTH AT BEDTIME  15 tablet  0  . hydrochlorothiazide (HYDRODIURIL) 25 MG tablet TAKE 1 TABLET (25 MG TOTAL) BY MOUTH DAILY.  30  tablet  11  . KRILL OIL 1000 MG CAPS Take 2 capsules by mouth daily.       . Lutein 20 MG CAPS Take by mouth daily.        . Multiple Vitamin (MULTIVITAMIN) tablet Take 2 tablets by mouth daily.        No current facility-administered medications on file prior to visit.     patient denies chest pain, shortness of breath, orthopnea. Denies lower extremity edema, abdominal pain, change in appetite, change in bowel movements. Patient denies rashes, musculoskeletal complaints. No other specific complaints in a complete review of systems.   BP 182/94  Pulse 92  Temp(Src) 97.9 F (36.6 C) (Oral)  Ht 5\' 1"  (1.549 m)  Wt 129 lb (58.514 kg)  BMI 24.39 kg/m2  Well-developed well-nourished female in no acute distress. HEENT exam atraumatic, normocephalic, extraocular muscles are intact. Neck is supple. No jugular venous distention no thyromegaly. Chest clear to auscultation without increased work of breathing. Cardiac exam S1 and S2 are regular. Abdominal exam active bowel sounds, soft, nontender. Extremities no edema. Neurologic exam she is alert without any motor sensory deficits. Gait is normal.Skin: no significant erythema.  Diffuse pruritis- ? Cause- steroid sensitive- I wonder whether related to HCTZ- will change BP meds. Moisturizer after shower.   HYPERTENSION Ok control Will change meds to losartan

## 2013-12-16 NOTE — Assessment & Plan Note (Signed)
Ok control Will change meds to losartan

## 2013-12-22 ENCOUNTER — Telehealth: Payer: Self-pay | Admitting: Internal Medicine

## 2013-12-22 NOTE — Telephone Encounter (Signed)
Opened in error

## 2013-12-23 ENCOUNTER — Other Ambulatory Visit: Payer: Self-pay | Admitting: Internal Medicine

## 2013-12-30 ENCOUNTER — Encounter: Payer: Self-pay | Admitting: Family Medicine

## 2013-12-30 ENCOUNTER — Ambulatory Visit (INDEPENDENT_AMBULATORY_CARE_PROVIDER_SITE_OTHER): Payer: Medicare HMO | Admitting: Family Medicine

## 2013-12-30 VITALS — BP 160/82 | HR 88 | Temp 98.1°F | Wt 130.0 lb

## 2013-12-30 DIAGNOSIS — R21 Rash and other nonspecific skin eruption: Secondary | ICD-10-CM

## 2013-12-30 DIAGNOSIS — M1712 Unilateral primary osteoarthritis, left knee: Secondary | ICD-10-CM

## 2013-12-30 DIAGNOSIS — I10 Essential (primary) hypertension: Secondary | ICD-10-CM

## 2013-12-30 DIAGNOSIS — E785 Hyperlipidemia, unspecified: Secondary | ICD-10-CM

## 2013-12-30 DIAGNOSIS — M549 Dorsalgia, unspecified: Secondary | ICD-10-CM

## 2013-12-30 DIAGNOSIS — K449 Diaphragmatic hernia without obstruction or gangrene: Secondary | ICD-10-CM

## 2013-12-30 DIAGNOSIS — E890 Postprocedural hypothyroidism: Secondary | ICD-10-CM

## 2013-12-30 DIAGNOSIS — M1710 Unilateral primary osteoarthritis, unspecified knee: Secondary | ICD-10-CM

## 2013-12-30 DIAGNOSIS — I447 Left bundle-branch block, unspecified: Secondary | ICD-10-CM

## 2013-12-30 DIAGNOSIS — Z9889 Other specified postprocedural states: Secondary | ICD-10-CM

## 2013-12-30 DIAGNOSIS — Z9009 Acquired absence of other part of head and neck: Secondary | ICD-10-CM | POA: Insufficient documentation

## 2013-12-30 LAB — COMPREHENSIVE METABOLIC PANEL
ALT: 15 U/L (ref 0–35)
AST: 23 U/L (ref 0–37)
Albumin: 4 g/dL (ref 3.5–5.2)
Alkaline Phosphatase: 50 U/L (ref 39–117)
BUN: 19 mg/dL (ref 6–23)
CO2: 30 mEq/L (ref 19–32)
Calcium: 9.2 mg/dL (ref 8.4–10.5)
Chloride: 103 mEq/L (ref 96–112)
Creatinine, Ser: 0.7 mg/dL (ref 0.4–1.2)
GFR: 80.11 mL/min (ref >60.00)
Glucose, Bld: 88 mg/dL (ref 70–99)
Potassium: 4.3 mEq/L (ref 3.5–5.1)
Sodium: 140 mEq/L (ref 135–145)
Total Bilirubin: 1 mg/dL (ref 0.2–1.2)
Total Protein: 6.3 g/dL (ref 6.0–8.3)

## 2013-12-30 LAB — CBC
HCT: 37.5 % (ref 36.0–46.0)
Hemoglobin: 12.6 g/dL (ref 12.0–15.0)
MCHC: 33.5 g/dL (ref 30.0–36.0)
MCV: 90.9 fl (ref 78.0–100.0)
Platelets: 199 10*3/uL (ref 150.0–400.0)
RBC: 4.12 Mil/uL (ref 3.87–5.11)
RDW: 15.4 % (ref 11.5–15.5)
WBC: 7.7 10*3/uL (ref 4.0–10.5)

## 2013-12-30 LAB — LIPID PANEL
Cholesterol: 247 mg/dL — ABNORMAL HIGH (ref 0–200)
HDL: 59 mg/dL (ref >39.00)
LDL Cholesterol: 163 mg/dL — ABNORMAL HIGH (ref 0–99)
NonHDL: 188
Total CHOL/HDL Ratio: 4
Triglycerides: 125 mg/dL (ref 0.0–149.0)
VLDL: 25 mg/dL (ref 0.0–40.0)

## 2013-12-30 LAB — TSH: TSH: 0.99 u[IU]/mL (ref 0.35–4.50)

## 2013-12-30 NOTE — Assessment & Plan Note (Signed)
Continue as needed tylenol. This has been most beneficial regimen for patient yet.

## 2013-12-30 NOTE — Progress Notes (Addendum)
Garret Reddish, MD Phone: [phone]  Subjective:  Patient presents today to establish care with me as PCP. Chief complaint-noted.   Hypertension BP Readings from Last 3 Encounters:  12/30/13 160/82  12/16/13 140/70  09/24/13 191/116  Home BP monitoring-at [facility] and has RN check twice a week and numbers systolic from 494-496, diastolic usually less than 90.  Compliant with medications-losartan for 2 weeks ROS-Denies any CP, HA, SOB, blurry vision, LE edema.   Hyperlipidemia On statin: just recently stopped lovastatin after meeting with Dr. Mare Ferrari ROS- no chest pain or shortness of breath. No myalgias  Left knee pain Had stress test after LBBB was found. Reports normal stress test. Wants to have knee surgery on left knee. Reports bone on bone arthritis. No help with injections in the past  Chronic low back pain Has had chronic low back pain since age [age] in low back and hips and has been ot multiple specialists. Had been to pain management previously and was only placed on oxycodone at 2 differnet places and prefers not to return. Patient eventually moved to just taking tylenol on regular basis. Took 650mg  every 6 hours. Has seen PT for it.  Ros-no leg weakness, fecal or urinary incontinence, saddle anesthesia  Rash Uses triamcinolone.  ROS- history of eczematous disease that improved on prednisone, upcoming appointment with dermatology.   The following were reviewed and entered/updated in epic: Past Medical History  Diagnosis Date  . BACK PAIN 03/08/2007  . DEPRESSIVE DISORDER 12/01/2008  . HIATAL HERNIA 12/17/2006  . HYPERLIPIDEMIA 03/09/2006  . HYPERTENSION 03/09/2006  . KNEE PAIN 05/06/2009  . GI bleed 09/12/2010    Naproxen induced Endoscopy with dr Harlene Ramus    Patient Active Problem List   Diagnosis Date Noted  . DNR (do not resuscitate) 12/16/2013    Priority: High  . LBBB (left bundle branch block) 07/30/2013    Priority: Medium  . Hyperlipemia 09/12/2010     Priority: Medium  . HYPERTENSION 03/09/2006    Priority: Medium  . History of partial thyroidectomy 12/30/2013    Priority: Low  . Rash and nonspecific skin eruption 12/30/2013    Priority: Low  . Osteoarthritis of left knee 06/17/2013    Priority: Low  . BACK PAIN 03/08/2007    Priority: Low  . HIATAL HERNIA 12/17/2006    Priority: Low   Past Surgical History  Procedure Laterality Date  . Abdominal hysterectomy    . Oophorectomy    . Thyroidectomy    . Rotator cuff repair    . Laminectomy      with fusion  . Cardiac catheterization      No family history on file.  Medications- reviewed and updated Current Outpatient Prescriptions  Medication Sig Dispense Refill  . Calcium-Magnesium 300-300 MG TABS Take 2 tablets by mouth daily.      . Cholecalciferol (VITAMIN D) 2000 UNITS CAPS Take by mouth.        . cyclobenzaprine (FLEXERIL) 10 MG tablet TAKE ONE-HALF TABLET BY MOUTH AT BEDTIME.  15 tablet  0  . KRILL OIL 1000 MG CAPS Take 2 capsules by mouth daily.       Marland Kitchen losartan (COZAAR) 50 MG tablet Take 1 tablet (50 mg total) by mouth daily.  90 tablet  3  . Lutein 20 MG CAPS Take by mouth daily.        . Multiple Vitamin (MULTIVITAMIN) tablet Take 2 tablets by mouth daily.       . Probiotic Product (PROBIOTIC DAILY PO)  Take 2 tablets by mouth daily.      Marland Kitchen acetaminophen (TYLENOL) 650 MG CR tablet Take 650 mg by mouth 3 (three) times daily.        No current facility-administered medications for this visit.    Allergies-reviewed and updated Allergies  Allergen Reactions  . Naproxen     Gi bleed  . Nsaids   . Ace Inhibitors     Cough   . Aspirin     GI bleed    History   Social History  . Marital Status: Widowed    Spouse Name: N/A    Number of Children: N/A  . Years of Education: N/A   Social History Main Topics  . Smoking status: Never Smoker   . Smokeless tobacco: None  . Alcohol Use: None  . Drug Use: None  . Sexual Activity: None   Other Topics  Concern  . None   Social History Narrative  . None    ROS--See HPI   Objective: BP 160/82  Pulse 88  Temp(Src) 98.1 F (36.7 C)  Wt 130 lb (58.968 kg) Gen: NAD, resting comfortably Neck: no thyromegaly CV: RRR no murmurs rubs or gallops Lungs: CTAB no crackles, wheeze, rhonchi Ext: no edema  Assessment/Plan:  HYPERTENSION Well controlled based off of RN readings at home. Continue losartan alone for now. If persistently elevated in office, could consider 100mg  losartan.   BACK PAIN Continue as needed tylenol. This has been most beneficial regimen for patient yet.   Hyperlipemia Patient taken off of lovastatin by cardiology given benefits/risks. Thought ? Related to memory loss but has not improved off medicine. Chronic pain has not improved either. I did discuss with patient that even though stress test normal, thought it would be reasonable to take statin 3 weeks before surgery and 1 week after (discussed this is opinion and not evidence based). We will check lipids based off fact that she is off medication today (she would like to know how much change there has been).   Osteoarthritis of left knee Patient asked for my opinion on potential surgery. She is DNR. She states that she would love to have improved pain and better mobility and that she felt that potential benefit if surgery would help is of greater potential benefit than the risk of potential death (which hopefully is low given low risk stress test). To improve quality of life, I agreed that considering moving forward with surgery would be a reasonable option. She has already discussed with Vickey Huger ,MD of orthopedics.   Rash and nonspecific skin eruption F.u derm. Prn triamcinolone.   f/u 6 mo  Orders Placed This Encounter  Procedures  . Lipid panel    [place]    Order Specific Question:  Has the patient fasted?    Answer:  No  . Comprehensive metabolic panel    [place]    Order Specific Question:  Has the  patient fasted?    Answer:  No  . TSH    [place]  . CBC

## 2013-12-30 NOTE — Assessment & Plan Note (Signed)
F.u derm. Prn triamcinolone.

## 2013-12-30 NOTE — Assessment & Plan Note (Addendum)
Patient taken off of lovastatin by cardiology given benefits/risks. Thought ? Related to memory loss but has not improved off medicine. Chronic pain has not improved either. I did discuss with patient that even though stress test normal, thought it would be reasonable to take statin 3 weeks before surgery and 1 week after (discussed this is opinion and not evidence based). We will check lipids based off fact that Vanessa Mason is off medication today (Vanessa Mason would like to know how much change there has been).

## 2013-12-30 NOTE — Assessment & Plan Note (Addendum)
Patient asked for my opinion on potential surgery. She is DNR. She states that she would love to have improved pain and better mobility and that she felt that potential benefit if surgery would help is of greater potential benefit than the risk of potential death (which hopefully is low given low risk stress test). To improve quality of life, I agreed that considering moving forward with surgery would be a reasonable option. She has already discussed with Vickey Huger ,MD of orthopedics.

## 2013-12-30 NOTE — Patient Instructions (Signed)
Blood pressure-looks great based off of numbers at home, no changes Cholesterol-I am ok with you staying off of medicine. This is not necessarily based on science but I would consider taking it 3 weeks before your surgery and 1 week after Left Knee pain-I think given how important quality walking is for you that it is reasonable to proceed forward with surgery especially given your low risk stress test Let me know how things go with dermatology

## 2013-12-30 NOTE — Assessment & Plan Note (Signed)
Well controlled based off of RN readings at home. Continue losartan alone for now. If persistently elevated in office, could consider 100mg  losartan.

## 2014-01-12 ENCOUNTER — Other Ambulatory Visit: Payer: Self-pay | Admitting: Internal Medicine

## 2014-01-13 NOTE — Telephone Encounter (Signed)
Is this ok to refill?  

## 2014-01-14 NOTE — Telephone Encounter (Signed)
Medication refilled

## 2014-01-19 ENCOUNTER — Other Ambulatory Visit: Payer: Self-pay | Admitting: Internal Medicine

## 2014-01-19 NOTE — Telephone Encounter (Signed)
Is this ok to refill?  

## 2014-02-10 ENCOUNTER — Encounter: Payer: Self-pay | Admitting: Family Medicine

## 2014-02-10 DIAGNOSIS — L570 Actinic keratosis: Secondary | ICD-10-CM | POA: Insufficient documentation

## 2014-02-20 ENCOUNTER — Ambulatory Visit (INDEPENDENT_AMBULATORY_CARE_PROVIDER_SITE_OTHER): Payer: Medicare HMO

## 2014-02-20 DIAGNOSIS — Z23 Encounter for immunization: Secondary | ICD-10-CM

## 2014-02-25 ENCOUNTER — Other Ambulatory Visit: Payer: Self-pay | Admitting: Family Medicine

## 2014-02-25 MED ORDER — CYCLOBENZAPRINE HCL 10 MG PO TABS: ORAL_TABLET | ORAL | Status: DC

## 2014-03-31 DIAGNOSIS — M7070 Other bursitis of hip, unspecified hip: Secondary | ICD-10-CM | POA: Insufficient documentation

## 2014-06-17 ENCOUNTER — Encounter: Payer: Self-pay | Admitting: Family Medicine

## 2014-06-17 ENCOUNTER — Ambulatory Visit (INDEPENDENT_AMBULATORY_CARE_PROVIDER_SITE_OTHER): Payer: Medicare HMO | Admitting: Family Medicine

## 2014-06-17 VITALS — BP 142/82 | Temp 98.2°F | Wt 132.0 lb

## 2014-06-17 DIAGNOSIS — F32A Depression, unspecified: Secondary | ICD-10-CM

## 2014-06-17 DIAGNOSIS — F329 Major depressive disorder, single episode, unspecified: Secondary | ICD-10-CM

## 2014-06-17 MED ORDER — CITALOPRAM HYDROBROMIDE 10 MG PO TABS: 10.0000 mg | ORAL_TABLET | Freq: Every day | ORAL | Status: DC

## 2014-06-17 NOTE — Progress Notes (Signed)
Garret Reddish, MD Phone: [phone]  Subjective:   Vanessa Mason is a 81 y.o. year old very pleasant female patient who presents with concerns of depressed mood. Needs a knee replacement but they will not complete it until she has healing from scards from emoval of squamous cell carcinoma .Can't wear stockings because they jerk scab off..   Frustration over the above situation as well as her decreased activity levels has increased her anxiety level. The whole situation together is frustrating to patient. Can't keep up with daily affairs and she is usually very actve at [facility] where she lives. Admits to decreased interest. Usually very high energy person and is not at this time. People have noticed a difference. She has a history many years ago where Daughter states used to take celexa though patient cannot recall.   ROS- No SI/HI, admits to depressed mood, some fatigue. No recent bleeding.   Past Medical History- Patient Active Problem List   Diagnosis Date Noted  . DNR (do not resuscitate) 12/16/2013    Priority: High  . LBBB (left bundle branch block) 07/30/2013    Priority: Medium  . Hyperlipemia 09/12/2010    Priority: Medium  . HYPERTENSION 03/09/2006    Priority: Medium  . Actinic keratosis 02/10/2014    Priority: Low  . History of partial thyroidectomy 12/30/2013    Priority: Low  . Rash and nonspecific skin eruption 12/30/2013    Priority: Low  . Osteoarthritis of left knee 06/17/2013    Priority: Low  . BACK PAIN 03/08/2007    Priority: Low  . HIATAL HERNIA 12/17/2006    Priority: Low  . Depression 06/18/2014   Medications- reviewed and updated Current Outpatient Prescriptions  Medication Sig Dispense Refill  . Calcium-Magnesium 300-300 MG TABS Take 2 tablets by mouth daily.    . Cholecalciferol (VITAMIN D) 2000 UNITS CAPS Take by mouth.      . cyclobenzaprine (FLEXERIL) 10 MG tablet TAKE ONE-HALF TABLET BY MOUTH AT BEDTIME 45 tablet 3  . KRILL OIL  1000 MG CAPS Take 2 capsules by mouth daily.     Marland Kitchen losartan (COZAAR) 50 MG tablet Take 1 tablet (50 mg total) by mouth daily. 90 tablet 3  . Lutein 20 MG CAPS Take by mouth daily.      . Multiple Vitamin (MULTIVITAMIN) tablet Take 2 tablets by mouth daily.     . Probiotic Product (PROBIOTIC DAILY PO) Take 2 tablets by mouth daily.    Marland Kitchen acetaminophen (TYLENOL) 650 MG CR tablet Take 650 mg by mouth 3 (three) times daily.       Objective: BP 142/82 mmHg  Temp(Src) 98.2 F (36.8 C)  Wt 132 lb (59.875 kg) Gen: NAD, resting comfortably Psych: depressed mood, not as energetic as last visit   Geriatric depression scale with score of 7   Lab Results  Component Value Date   TSH 0.99 12/30/2013    Lab Results  Component Value Date   WBC 7.7 12/30/2013   HGB 12.6 12/30/2013   HCT 37.5 12/30/2013   MCV 90.9 12/30/2013   PLT 199.0 12/30/2013   Assessment/Plan:  Depression Geriatric depression scale with score of 7 in patient with history of depression it appears in the past. I discussed with patient counseling has no side effects but she did not have a good experience with this in the past. She would like to trial medication. I did discuss with her this may simply be situational given her inability due to pain to do  things she formerly enjoyed and may be short lived. Patient opted to start celexa which she had been on in the past (last EKG reviewed but warned of cardiac risk). We will follow up in 10-14 days with warning precautions given for sooner return. F/u safety, tolerability, efficacy next visit.    Very low dose, likely titrate to 20mg  maximum.  Meds ordered this encounter  Medications  . citalopram (CELEXA) 10 MG tablet    Sig: Take 1 tablet (10 mg total) by mouth daily.    Dispense:  30 tablet    Refill:  3   >50% of 25 minute office visit was spent on counseling (depression, dealing with recent frustrations, benefits/risks of treatment options) and coordination of care

## 2014-06-17 NOTE — Patient Instructions (Addendum)
Concern for depression. Counseling has no side effects but you opted for antidepressant which you have taken before which I think is a very reasonable choice. Let's check in 10-14 days.   We will recheck your blood pressure and consider going up to losartan 100mg  if it is above 150/90  AVS (depressionmed):  Taking the medicine as directed and not missing any doses is one of the best things you can do to treat your depression.  Here are some things to keep in mind:  1) Side effects (stomach upset, some increased anxiety) may happen before you notice a benefit.  These side effects typically go away over time. 2) Changes to your dose of medicine or a change in medication all together is sometimes necessary 3) Most people need to be on medication at least 6-12 months 4) Many people will notice an improvement within two weeks but the full effect of the medication can take up to 4-6 weeks 5) Stopping the medication when you start feeling better often results in a return of symptoms 6) If you start having thoughts of hurting yourself or others after starting this medicine, please call me at [phone] immediately.

## 2014-06-18 DIAGNOSIS — F32A Depression, unspecified: Secondary | ICD-10-CM | POA: Insufficient documentation

## 2014-06-18 DIAGNOSIS — F329 Major depressive disorder, single episode, unspecified: Secondary | ICD-10-CM | POA: Insufficient documentation

## 2014-06-18 NOTE — Assessment & Plan Note (Signed)
Geriatric depression scale with score of 7 in patient with history of depression it appears in the past. I discussed with patient counseling has no side effects but she did not have a good experience with this in the past. She would like to trial medication. I did discuss with her this may simply be situational given her inability due to pain to do things she formerly enjoyed and may be short lived. Patient opted to start celexa which she had been on in the past (last EKG reviewed but warned of cardiac risk). We will follow up in 10-14 days with warning precautions given for sooner return. F/u safety, tolerability, efficacy next visit.

## 2014-07-02 ENCOUNTER — Ambulatory Visit (INDEPENDENT_AMBULATORY_CARE_PROVIDER_SITE_OTHER): Payer: Medicare HMO | Admitting: Family Medicine

## 2014-07-02 ENCOUNTER — Encounter: Payer: Self-pay | Admitting: Family Medicine

## 2014-07-02 VITALS — BP 140/78 | Temp 98.0°F | Wt 130.0 lb

## 2014-07-02 DIAGNOSIS — E785 Hyperlipidemia, unspecified: Secondary | ICD-10-CM

## 2014-07-02 DIAGNOSIS — I10 Essential (primary) hypertension: Secondary | ICD-10-CM

## 2014-07-02 DIAGNOSIS — F32A Depression, unspecified: Secondary | ICD-10-CM

## 2014-07-02 DIAGNOSIS — F329 Major depressive disorder, single episode, unspecified: Secondary | ICD-10-CM

## 2014-07-02 NOTE — Assessment & Plan Note (Signed)
Once again we discussed no clear benefit when balancing benefits/risks for primary prevention in her age group. LDL did increase from 88 to 163 off medication. I told her we may be able to lower risk around time of surgery with statin. She would like to discuss further with Dr. Mare Ferrari but will remain off statin for now.

## 2014-07-02 NOTE — Patient Instructions (Addendum)
Blood pressure looks reasonable.   Cholesterol elevated. Could consider cholesterol medicine around the time of your surgery. You also wanted to discuss with Dr. Mare Ferrari.   Follow up in 4 weeks. Call us immediately if any thoughts of hurting yourself. The celexa often takes 6 weeks to really kick in. I don't want to increase your medication at this time.

## 2014-07-02 NOTE — Assessment & Plan Note (Signed)
reasonable control on 50mg  losartan. We discussed jnc 8 guidelines of 150 sbp and other new data suggesting some reduced risk CVA/MI pushing below 140. We opted not to further increase losartan as long as below 150.

## 2014-07-02 NOTE — Progress Notes (Signed)
Garret Reddish, MD Phone: [phone]  Subjective:   Vanessa Mason is a 81 y.o. year old very pleasant female patient who presents with the following:  Hypertension-reasonable control on 50mg  losartan. BP Readings from Last 3 Encounters:  07/02/14 140/78  06/17/14 142/82  12/30/13 160/82  Home BP monitoring-yes with most systolics between 588-502.  Compliant with medications-yes without side effects ROS-Denies any CP, HA, SOB, blurry vision.  Hyperlipidemia-poor control with LDL from 88 to 163 off medication Lab Results  Component Value Date   LDLCALC 163* 12/30/2013  ROS- no chest pain or shortness of breath. No myalgias  Depression-stable to improved.  Tolerating celexa. Has been very busy with visitors for 5 days and states spirits were excellent. Hard for her to assess if overall any improvement. No side effects reported.  ROS- No Si/HI  Past Medical History- Patient Active Problem List   Diagnosis Date Noted  . DNR (do not resuscitate) 12/16/2013    Priority: High  . Depression 06/18/2014    Priority: Medium  . LBBB (left bundle branch block) 07/30/2013    Priority: Medium  . Hyperlipemia 09/12/2010    Priority: Medium  . Essential hypertension 03/09/2006    Priority: Medium  . Actinic keratosis 02/10/2014    Priority: Low  . History of partial thyroidectomy 12/30/2013    Priority: Low  . Rash and nonspecific skin eruption 12/30/2013    Priority: Low  . Osteoarthritis of left knee 06/17/2013    Priority: Low  . BACK PAIN 03/08/2007    Priority: Low  . HIATAL HERNIA 12/17/2006    Priority: Low   Medications- reviewed and updated Current Outpatient Prescriptions  Medication Sig Dispense Refill  . acetaminophen (TYLENOL) 650 MG CR tablet Take 650 mg by mouth 3 (three) times daily.     . Calcium-Magnesium 300-300 MG TABS Take 2 tablets by mouth daily.    . Cholecalciferol (VITAMIN D) 2000 UNITS CAPS Take by mouth.      . citalopram (CELEXA) 10 MG  tablet Take 1 tablet (10 mg total) by mouth daily. 30 tablet 3  . cyclobenzaprine (FLEXERIL) 10 MG tablet TAKE ONE-HALF TABLET BY MOUTH AT BEDTIME 45 tablet 3  . KRILL OIL 1000 MG CAPS Take 2 capsules by mouth daily.     Marland Kitchen losartan (COZAAR) 50 MG tablet Take 1 tablet (50 mg total) by mouth daily. 90 tablet 3  . Lutein 20 MG CAPS Take by mouth daily.      . Multiple Vitamin (MULTIVITAMIN) tablet Take 2 tablets by mouth daily.     . Probiotic Product (PROBIOTIC DAILY PO) Take 2 tablets by mouth daily.     Objective: BP 140/78 mmHg  Temp(Src) 98 F (36.7 C)  Wt 130 lb (58.968 kg)  Gen: NAD, resting comfortably CV: RRR no murmurs rubs or gallops Lungs: CTAB no crackles, wheeze, rhonchi Ext: 1+ edema pretibial L>R. This is around areas of previously removed squamous cell carcinoma. Multiple healing lesions noted (under care of dermatology) Skin: warm, dry otherwise   Psych: cheerful, nondepressed mood   Assessment/Plan:  Essential hypertension reasonable control on 50mg  losartan. We discussed jnc 8 guidelines of 150 sbp and other new data suggesting some reduced risk CVA/MI pushing below 140. We opted not to further increase losartan as long as below 150.    Hyperlipemia Once again we discussed no clear benefit when balancing benefits/risks for primary prevention in her age group. LDL did increase from 88 to 163 off medication. I told her we  may be able to lower risk around time of surgery with statin. She would like to discuss further with Dr. Mare Ferrari but will remain off statin for now.    Depression Safe and tolerable on low dose celexa. Unclear if efficacious at minimum safe to improved. We will give 4 more weeks then repeat geriatric depression scale previously at 7. Would prefer not to titrate up if possible with hyponatremia risks.     4 week follow up planne.d

## 2014-07-02 NOTE — Assessment & Plan Note (Addendum)
Safe and tolerable on low dose celexa. Unclear if efficacious at minimum safe to improved. We will give 4 more weeks then repeat geriatric depression scale previously at 7. Would prefer not to titrate up if possible with hyponatremia risks.

## 2014-07-30 ENCOUNTER — Ambulatory Visit (INDEPENDENT_AMBULATORY_CARE_PROVIDER_SITE_OTHER): Payer: Medicare HMO | Admitting: Family Medicine

## 2014-07-30 ENCOUNTER — Encounter: Payer: Self-pay | Admitting: Family Medicine

## 2014-07-30 VITALS — BP 130/84 | Temp 97.7°F | Wt 131.0 lb

## 2014-07-30 DIAGNOSIS — F329 Major depressive disorder, single episode, unspecified: Secondary | ICD-10-CM

## 2014-07-30 DIAGNOSIS — Z23 Encounter for immunization: Secondary | ICD-10-CM

## 2014-07-30 DIAGNOSIS — F32A Depression, unspecified: Secondary | ICD-10-CM

## 2014-07-30 MED ORDER — CITALOPRAM HYDROBROMIDE 10 MG PO TABS: 10.0000 mg | ORAL_TABLET | Freq: Every day | ORAL | Status: DC

## 2014-07-30 NOTE — Assessment & Plan Note (Signed)
Patient does not drastically feel better per her report but her geriatric depression scale now shows good control at 4 down from 7 on citalopram 10mg . She is sleepy at times on current rx and I am tentative to increase above current dose. I provided counseling through visit for some of her medical problems complicating matters and this seemed to help her with relief. We decided to touch base in about 4-6 weeks and continue to counsel and encourage.

## 2014-07-30 NOTE — Progress Notes (Signed)
Garret Reddish, MD Phone: [phone]  Subjective:   Vanessa Mason is a 81 y.o. year old very pleasant female patient who presents with the following:  Depression- improving.  Patient states has not seen a major difference on citalopram 10mg . Despite this geriatric depression scale short form now down to 4 from 7 and honestly she is hesitant on these questions when previously was firmly convinced of answers. Scored point for "are you in good spirits most of the time?:, "do you feel happy most of the time?", "do you often feel helpless", "do you feel full of energy".  SE- "feels more sleepy" on citalopram ROS- No SI/HI. Denies anxiety. Just primarily frustrated with process of squamous cells on legs healing and hoping for knee replacement-she wants to be more active.   Past Medical History- Patient Active Problem List   Diagnosis Date Noted  . DNR (do not resuscitate) 12/16/2013    Priority: High  . Depression 06/18/2014    Priority: Medium  . LBBB (left bundle branch block) 07/30/2013    Priority: Medium  . Hyperlipemia 09/12/2010    Priority: Medium  . Essential hypertension 03/09/2006    Priority: Medium  . Actinic keratosis 02/10/2014    Priority: Low  . History of partial thyroidectomy 12/30/2013    Priority: Low  . Rash and nonspecific skin eruption 12/30/2013    Priority: Low  . Osteoarthritis of left knee 06/17/2013    Priority: Low  . BACK PAIN 03/08/2007    Priority: Low  . HIATAL HERNIA 12/17/2006    Priority: Low   Medications- reviewed and updated Current Outpatient Prescriptions  Medication Sig Dispense Refill  . acetaminophen (TYLENOL) 650 MG CR tablet Take 650 mg by mouth as needed.     . Calcium-Magnesium 300-300 MG TABS Take 2 tablets by mouth daily.    . Cholecalciferol (VITAMIN D) 2000 UNITS CAPS Take by mouth.      . citalopram (CELEXA) 10 MG tablet Take 1 tablet (10 mg total) by mouth daily. 30 tablet 3  . cyclobenzaprine (FLEXERIL) 10 MG  tablet TAKE ONE-HALF TABLET BY MOUTH AT BEDTIME 45 tablet 3  . KRILL OIL 1000 MG CAPS Take 2 capsules by mouth daily.     Marland Kitchen losartan (COZAAR) 50 MG tablet Take 1 tablet (50 mg total) by mouth daily. 90 tablet 3  . Lutein 20 MG CAPS Take 15 mg by mouth daily.     . Multiple Vitamin (MULTIVITAMIN) tablet Take 2 tablets by mouth daily.     . Probiotic Product (PROBIOTIC DAILY PO) Take 2 tablets by mouth daily.      Objective: BP 130/84 mmHg  Temp(Src) 97.7 F (36.5 C) (Oral)  Wt 131 lb (59.421 kg) Gen: NAD, resting comfortably Psych: brighter spirits   Assessment/Plan:  Depression Patient does not drastically feel better per her report but her geriatric depression scale now shows good control at 4 down from 7 on citalopram 10mg . She is sleepy at times on current rx and I am tentative to increase above current dose. I provided counseling through visit for some of her medical problems complicating matters and this seemed to help her with relief. We decided to touch base in about 4-6 weeks and continue to counsel and encourage.     Return precautions advised. I shared with patient that she was and will be in my thoughts regarding her depression, knee OA needing replacement and SCC of skin on legs. She will be turning 89 and spending time with family  before I see her next.   Orders Placed This Encounter  Procedures  . Pneumococcal conjugate vaccine 13-valent IM   Meds ordered this encounter  Medications  .  citalopram (CELEXA) 10 MG tablet    Sig: Take 1 tablet (10 mg total) by mouth daily. To be filled if out of town    Dispense:  30 tablet    Refill:  0  . citalopram (CELEXA) 10 MG tablet    Sig: Take 1 tablet (10 mg total) by mouth daily.    Dispense:  30 tablet    Refill:  5   >50% of 15 minute office visit was spent on counseling (regarding depression) and coordination of care

## 2014-07-30 NOTE — Patient Instructions (Addendum)
Prevnar 13 today  Wonderful to see you as always.   Happy early 44th birthday! Have a great trip.   Things seem better than last time based off your score sheet. Let's check in about 4-6 weeks from now to see how you are doing and to keep encouraging you!

## 2014-08-25 ENCOUNTER — Telehealth: Payer: Self-pay | Admitting: Family Medicine

## 2014-08-25 ENCOUNTER — Encounter: Payer: Self-pay | Admitting: Family Medicine

## 2014-08-25 ENCOUNTER — Ambulatory Visit (INDEPENDENT_AMBULATORY_CARE_PROVIDER_SITE_OTHER): Payer: Medicare HMO | Admitting: Family Medicine

## 2014-08-25 VITALS — BP 102/80 | HR 103 | Temp 97.7°F | Wt 126.0 lb

## 2014-08-25 DIAGNOSIS — J069 Acute upper respiratory infection, unspecified: Secondary | ICD-10-CM

## 2014-08-25 NOTE — Telephone Encounter (Signed)
Look under 06/26/10 imaging. She has already had one. We can look at this together if need be. Please abstract this in. Cancel dexa order. She does not need a DEXA.

## 2014-08-25 NOTE — Patient Instructions (Signed)
Upper respiratory infection  Usually lasts 7-14 days  Rest and staying hydrated most important thing  Plain mucinex may help with congestion feeling  Reasons for return: fever, worsening symptoms, persistence beyond 2 weeks, or shortness of breath

## 2014-08-25 NOTE — Progress Notes (Signed)
  Garret Reddish, MD Phone: [phone]  Subjective:   Vanessa Mason is a 81 y.o. year old very pleasant female patient who presents with the following:  Cough/congestion -returned from [facility] Thursday night. Sick Since Friday morning-feels like having congestion (may be coming higher but feels like in chest). Having cough. Getting some better after worsening through Sunday. Still coughing up phlegm clear. Temperature max up to 99.9 but no fevers. RN was concerned at [facility] and wondered if needed antibiotics. No treatments tried other than rest. Nothing makes it better or worse.   ROS- no fever/chills/nausea/vomiting. No leg swelling or shortness of breath. No calf pain  Past Medical History- Patient Active Problem List   Diagnosis Date Noted  . DNR (do not resuscitate) 12/16/2013    Priority: High  . Depression 06/18/2014    Priority: Medium  . LBBB (left bundle branch block) 07/30/2013    Priority: Medium  . Hyperlipemia 09/12/2010    Priority: Medium  . Essential hypertension 03/09/2006    Priority: Medium  . Actinic keratosis 02/10/2014    Priority: Low  . History of partial thyroidectomy 12/30/2013    Priority: Low  . Rash and nonspecific skin eruption 12/30/2013    Priority: Low  . Osteoarthritis of left knee 06/17/2013    Priority: Low  . BACK PAIN 03/08/2007    Priority: Low  . HIATAL HERNIA 12/17/2006    Priority: Low   Medications- reviewed and updated Current Outpatient Prescriptions  Medication Sig Dispense Refill  . Calcium-Magnesium 300-300 MG TABS Take 2 tablets by mouth daily.    . Cholecalciferol (VITAMIN D) 2000 UNITS CAPS Take by mouth.      . citalopram (CELEXA) 10 MG tablet Take 1 tablet (10 mg total) by mouth daily. 30 tablet 5  . KRILL OIL 1000 MG CAPS Take 2 capsules by mouth daily.     Marland Kitchen losartan (COZAAR) 50 MG tablet Take 1 tablet (50 mg total) by mouth daily. 90 tablet 3  . Lutein 20 MG CAPS Take 15 mg by mouth daily.     .  Multiple Vitamin (MULTIVITAMIN) tablet Take 2 tablets by mouth daily.     . Probiotic Product (PROBIOTIC DAILY PO) Take 2 tablets by mouth daily.    Marland Kitchen acetaminophen (TYLENOL) 650 MG CR tablet Take 650 mg by mouth as needed.     . cyclobenzaprine (FLEXERIL) 10 MG tablet TAKE ONE-HALF TABLET BY MOUTH AT BEDTIME (Patient not taking: Reported on 08/25/2014) 45 tablet 3   No current facility-administered medications for this visit.    Objective: BP 102/80 mmHg  Pulse 103  Temp(Src) 97.7 F (36.5 C)  Wt 126 lb (57.153 kg)  SpO2 94% Gen: NAD, resting comfortably in chair TM normal, Mucous membranes are moist with mild pharynx erythema, turbinates erythematous with some clear drainage CV: RRR no murmurs rubs or gallops Lungs: CTAB no crackles, wheeze, rhonchi Abdomen: soft/nontender/nondistended/normal bowel sounds.  Ext: no edema, no calf tenderness Skin: warm, dry, no rash   Assessment/Plan:  Upper Respiratory Infection Advised of symptomatic care (see AVS).  Doubt influenza as afebrile Given reasons for return  HR 103, but no calf swelling or edema- doubt DVT and subsequently doubt PE as cause specifically with no shortness of breath

## 2014-08-25 NOTE — Telephone Encounter (Signed)
Pt said Dr Yong Channel wanted her to have a bone density test. I will schedule just need the order put in Epic please

## 2014-08-25 NOTE — Telephone Encounter (Signed)
I looked back in previous OV notes but didn't see anything regarding bone density, however in health maintenece she is due for one. The only thing I see is Osteo Arthritis in her problem list, could I use this when i enter in the Dexa?

## 2014-08-26 ENCOUNTER — Encounter: Payer: Self-pay | Admitting: Family Medicine

## 2014-08-26 NOTE — Telephone Encounter (Signed)
DEXA abstracted.

## 2014-09-03 ENCOUNTER — Ambulatory Visit: Payer: Medicare HMO | Admitting: Family Medicine

## 2014-09-15 ENCOUNTER — Inpatient Hospital Stay: Admission: RE | Admit: 2014-09-15 | Payer: Medicare HMO | Source: Ambulatory Visit

## 2014-10-07 ENCOUNTER — Ambulatory Visit (INDEPENDENT_AMBULATORY_CARE_PROVIDER_SITE_OTHER): Payer: Medicare HMO | Admitting: Family Medicine

## 2014-10-07 ENCOUNTER — Encounter: Payer: Self-pay | Admitting: Family Medicine

## 2014-10-07 VITALS — BP 138/70 | HR 96 | Temp 98.2°F | Wt 130.0 lb

## 2014-10-07 DIAGNOSIS — F32A Depression, unspecified: Secondary | ICD-10-CM

## 2014-10-07 DIAGNOSIS — Z01818 Encounter for other preprocedural examination: Secondary | ICD-10-CM | POA: Diagnosis not present

## 2014-10-07 DIAGNOSIS — F329 Major depressive disorder, single episode, unspecified: Secondary | ICD-10-CM | POA: Diagnosis not present

## 2014-10-07 NOTE — Patient Instructions (Signed)
Medically maximized for surgery with exception of cholesterol being high. Given you had muscle aches on the medicine and have never had a heart attack or stroke, we did not decide to restart.   You want to continue your DNR through the surgery but agreed to IV meds, antibiotics, fluids just no chest compression or shock if you were to have a heart attack. You also want DNR/DNI after extubated including if you were to have a stroke.   You state it is very important for you for quality of life to have this surgery and we believe benefits of surgery outweigh risk.   Depression has significantly improved. Continue celexa.  Can use flonase daily for a month to see if helps with twice a day cough. If does not, come back and see me.

## 2014-10-07 NOTE — Assessment & Plan Note (Signed)
Geriatric depression scale down from 7 to 4 and now 1 with celexa 10mg . Continue current rx.

## 2014-10-07 NOTE — Progress Notes (Signed)
Garret Reddish, MD  Subjective:  Vanessa Mason is a 81 y.o. year old very pleasant female patient who presents with:  Depression -last visit GDS improved from 7 to 4. Scored for good spirits, happy most of time, often feel helpless, full of energy. DId feel more sleepy on citalopram 10mg . Did not titrate up. Today Geriatric depression scale down to 1 just for score of energy.  ROS- No SI/HI. Denies depressed mood or anxiety  Preoperative evaluation Overall, patient feels improved though admits her limited activity due to her knee is her biggest issue currently. She is planning on surgery in next few weeks and asks for my surgical clearance for left total knee replacement. Last year in April she was seen by cardiology and had a myocardial perfusion stress test which was read as low risk. Her surgery was halted due to nonhealing wounds on her legs though. Those lesions have improed and she is ready to proceed. No changes in exercise tolerance. Still without chest pain, does get winded with activity like stairs or prolonged walking but no recent worsening. This surgery is very important to her for her mobility as she really loves to get out and do more than she is able at present. Never had MI or CVA. Lipids poorly controlled. BP well controlled.   Past Medical History- DNR from chest compressions/shock perspective, DNI accept for elective procedures, HTN, HLD, OA bilateral knees  Medications- reviewed and updated Current Outpatient Prescriptions  Medication Sig Dispense Refill  . Calcium-Magnesium 300-300 MG TABS Take 2 tablets by mouth daily.    . Cholecalciferol (VITAMIN D) 2000 UNITS CAPS Take by mouth.      . citalopram (CELEXA) 10 MG tablet Take 1 tablet (10 mg total) by mouth daily. 30 tablet 5  . KRILL OIL 1000 MG CAPS Take 2 capsules by mouth daily.     Marland Kitchen losartan (COZAAR) 50 MG tablet Take 1 tablet (50 mg total) by mouth daily. 90 tablet 3  . Lutein 20 MG CAPS Take 15 mg by mouth  daily.     . Multiple Vitamin (MULTIVITAMIN) tablet Take 2 tablets by mouth daily.     . Probiotic Product (PROBIOTIC DAILY PO) Take 2 tablets by mouth daily.    Marland Kitchen acetaminophen (TYLENOL) 650 MG CR tablet Take 650 mg by mouth as needed.     . cyclobenzaprine (FLEXERIL) 10 MG tablet TAKE ONE-HALF TABLET BY MOUTH AT BEDTIME (Patient not taking: Reported on 08/25/2014) 45 tablet 3   No current facility-administered medications for this visit.    Objective: BP 138/70 mmHg  Pulse 96  Temp(Src) 98.2 F (36.8 C)  Wt 130 lb (58.968 kg)  SpO2 95% Gen: NAD, resting comfortably CV: RRR no murmurs rubs or gallops Lungs: CTAB no crackles, wheeze, rhonchi Abdomen: soft/nontender/nondistended/normal bowel sounds.  Ext: no edema Skin: warm, dry, wounds on legs have closed   Assessment/Plan:  Depression Geriatric depression scale down from 7 to 4 and now 1 with celexa 10mg . Continue current rx.    Preoperative evaluation HTN-excellent control HLD- has myalgias on therapy. LDL elevated and does present elevated risk but believe risks of myalgias and poor exercise tolerance after surgery outweighs risk At baseline shortness of breath with no worsening since  08/2013 Stress testing through [facility] low risk and reassuring. Will reach out to Dr. Mare Ferrari who saw her last year to see if he has any objections.  Patient wants to remain DNR throughout surgery in regards to if she has an MI.  She believes improving her mobility is more important than risk of death and wants to proceed forward with surgery.   Return precautions advised. Happy to see her 4-6 weeks after surgery, prn or in 6 months as she determines.   >50% of 25 minute office visit was spent on counseling (depression, obstacles to her completing surgery in past, importance of sugery for her, code status) and coordination of care

## 2014-10-08 ENCOUNTER — Inpatient Hospital Stay: Admission: RE | Admit: 2014-10-08 | Payer: Medicare HMO | Source: Ambulatory Visit

## 2014-10-09 ENCOUNTER — Ambulatory Visit (INDEPENDENT_AMBULATORY_CARE_PROVIDER_SITE_OTHER): Payer: Medicare HMO | Admitting: Cardiology

## 2014-10-09 ENCOUNTER — Encounter: Payer: Self-pay | Admitting: Cardiology

## 2014-10-09 VITALS — BP 142/70 | HR 90 | Ht 60.0 in | Wt 129.4 lb

## 2014-10-09 DIAGNOSIS — E7800 Pure hypercholesterolemia, unspecified: Secondary | ICD-10-CM

## 2014-10-09 DIAGNOSIS — I447 Left bundle-branch block, unspecified: Secondary | ICD-10-CM | POA: Diagnosis not present

## 2014-10-09 NOTE — Patient Instructions (Signed)
Medication Instructions:  Your physician recommends that you continue on your current medications as directed. Please refer to the Current Medication list given to you today.  Labwork: none  Testing/Procedures: none  Follow-Up: Your physician wants you to follow-up in: 1 years ov//ekg  You will receive a reminder letter in the mail two months in advance. If you don't receive a letter, please call our office to schedule the follow-up appointment.   Any Other Special Instructions Will Be Listed Below (If Applicable).

## 2014-10-09 NOTE — Progress Notes (Signed)
Cardiology Office Note   Date:  10/09/2014   ID:  Vanessa Mason, DOB 1918/03/11, MRN [id number]  PCP:  Vanessa Reddish, MD  Cardiologist: Vanessa Coco MD  No chief complaint on file.     History of Present Illness: Vanessa Mason is a 81 y.o. female who presents for a one-year follow-up office visit, and preoperative clearance.  This 81 year old Caucasian female resident at [facility] is seen for a one-year follow-up office visit.  She has a past history of left bundle branch block.  He had a low risk myocardial perfusion study on 09/25/13.  She has significant osteoarthritis and is planning on a left total knee replacement on June 20.  Dr. Vickey Mason is her orthopedist. Has a history of hypertension and has had a good response to losartan 50 mg daily.  She had previously been on lovastatin 40 mg daily.  It was stopped a year ago because the patient thought it might have been causing some mental decline as judged by her daughter.  Her LDL has climbed to 163 since she has been off the lovastatin. She has not been expressing any chest discomfort.  She states that she has had chronic exertional dyspnea since she was 81 years old.  She states that it is no worse now than previous.  Past Medical History  Diagnosis Date  . BACK PAIN 03/08/2007  . DEPRESSIVE DISORDER 12/01/2008  . HIATAL HERNIA 12/17/2006  . HYPERLIPIDEMIA 03/09/2006  . HYPERTENSION 03/09/2006  . KNEE PAIN 05/06/2009  . GI bleed 09/12/2010    Naproxen induced Endoscopy with dr Vanessa Mason     Past Surgical History  Procedure Laterality Date  . Abdominal hysterectomy    . Oophorectomy    . Thyroidectomy    . Rotator cuff repair    . Laminectomy      with fusion  . Cardiac catheterization       Current Outpatient Prescriptions  Medication Sig Dispense Refill  . acetaminophen (TYLENOL) 650 MG CR tablet Take 650 mg by mouth as needed (for pain).     . Calcium-Magnesium 300-300 MG TABS Take 2 tablets by  mouth daily.    . Cholecalciferol (VITAMIN D) 2000 UNITS CAPS Take 1 capsule by mouth daily.     . citalopram (CELEXA) 10 MG tablet Take 1 tablet (10 mg total) by mouth daily. 30 tablet 5  . cyclobenzaprine (FLEXERIL) 10 MG tablet TAKE ONE-HALF TABLET BY MOUTH AT BEDTIME 45 tablet 3  . KRILL OIL 1000 MG CAPS Take 2 capsules by mouth daily.     Marland Kitchen losartan (COZAAR) 50 MG tablet Take 1 tablet (50 mg total) by mouth daily. 90 tablet 3  . Lutein 20 MG CAPS Take 20 mg by mouth daily.     . Multiple Vitamin (MULTIVITAMIN) tablet Take 2 tablets by mouth daily.     . Probiotic Product (PROBIOTIC DAILY PO) Take 2 tablets by mouth daily.     No current facility-administered medications for this visit.    Allergies:   Naproxen; Nsaids; Ace inhibitors; and Aspirin    Social History:  The patient  reports that she has never smoked. She does not have any smokeless tobacco history on file.   Family History:  The patient's family history includes Cancer in her brother; Cancer (age of onset: 24) in her mother.    ROS:  Please see the history of present illness.   Otherwise, review of systems are positive for none.   All other systems  are reviewed and negative.    PHYSICAL EXAM: VS:  BP 142/70 mmHg  Pulse 90  Ht 5' (1.524 m)  Wt 129 lb 6.4 oz (58.695 kg)  BMI 25.27 kg/m2 , BMI Body mass index is 25.27 kg/(m^2). GEN: Well nourished, well developed, in no acute distress HEENT: normal Neck: no JVD, carotid bruits, or masses Cardiac: RRR; no murmurs, rubs, or gallops,no edema  Respiratory:  clear to auscultation bilaterally, normal work of breathing GI: soft, nontender, nondistended, + BS MS: no deformity or atrophy Skin: warm and dry, no rash Neuro:  Strength and sensation are intact Psych: euthymic mood, full affect   EKG:  EKG is ordered today. The ekg ordered today demonstrates normal sinus rhythm with interventricular conduction disturbance.  Since previous tracing of 07/30/13, the QRS has  narrowed   Recent Labs: 12/30/2013: ALT 15; BUN 19; Creatinine 0.7; Hemoglobin 12.6; Platelets 199.0; Potassium 4.3; Sodium 140; TSH 0.99    Lipid Panel    Component Value Date/Time   CHOL 247* 12/30/2013 0958   TRIG 125.0 12/30/2013 0958   HDL 59.00 12/30/2013 0958   CHOLHDL 4 12/30/2013 0958   VLDL 25.0 12/30/2013 0958   LDLCALC 163* 12/30/2013 0958   LDLDIRECT 118.7 07/20/2011 0958      Wt Readings from Last 3 Encounters:  10/09/14 129 lb 6.4 oz (58.695 kg)  10/07/14 130 lb (58.968 kg)  08/25/14 126 lb (57.153 kg)         ASSESSMENT AND PLAN:  1. Good general health 2. left bundle branch block. 3. Hypertension 4. Hypercholesterolemia 5. Osteoarthritis   Current medicines are reviewed at length with the patient today.  The patient does not have concerns regarding medicines.  The following changes have been made:  no change  Labs/ tests ordered today include:   Orders Placed This Encounter  Procedures  . EKG 12-Lead     Disposition: The patient is cleared from a cardiac standpoint for her upcoming left total knee replacement. In regard to her hypercholesterolemia, I will be inclined not to restart her statin at this point.  She will need all the leg strength she can muster for the rehabilitation following knee surgery.  After she has recovered from the knee surgery and is ambulating well she could consider restarting statin therapy but at this point I would emphasize dietary management. Recheck here in one year for office visit and EKG  Signed, Vanessa Coco MD 10/09/2014 5:30 PM    [address], [place],   [address] Phone: [phone]; Fax: [phone]

## 2014-10-20 ENCOUNTER — Other Ambulatory Visit: Payer: Self-pay | Admitting: Orthopedic Surgery

## 2014-10-20 DIAGNOSIS — M25572 Pain in left ankle and joints of left foot: Secondary | ICD-10-CM

## 2014-10-20 DIAGNOSIS — M25552 Pain in left hip: Secondary | ICD-10-CM

## 2014-10-20 DIAGNOSIS — M25562 Pain in left knee: Secondary | ICD-10-CM

## 2014-10-23 ENCOUNTER — Ambulatory Visit: Payer: Medicare HMO | Admitting: Cardiology

## 2014-10-27 ENCOUNTER — Other Ambulatory Visit: Payer: Self-pay | Admitting: Orthopedic Surgery

## 2014-10-28 ENCOUNTER — Ambulatory Visit
Admission: RE | Admit: 2014-10-28 | Discharge: 2014-10-28 | Disposition: A | Discharge status: Home / Self Care | Payer: Medicare HMO | Source: Ambulatory Visit | Attending: Orthopedic Surgery | Admitting: Orthopedic Surgery

## 2014-10-28 ENCOUNTER — Other Ambulatory Visit: Payer: Self-pay | Admitting: Orthopedic Surgery

## 2014-10-28 DIAGNOSIS — M25552 Pain in left hip: Secondary | ICD-10-CM

## 2014-10-28 DIAGNOSIS — M25572 Pain in left ankle and joints of left foot: Secondary | ICD-10-CM

## 2014-10-28 DIAGNOSIS — M25562 Pain in left knee: Secondary | ICD-10-CM

## 2014-10-29 ENCOUNTER — Ambulatory Visit: Payer: Medicare HMO | Admitting: Cardiology

## 2014-11-12 ENCOUNTER — Encounter (HOSPITAL_COMMUNITY)
Admission: RE | Admit: 2014-11-12 | Discharge: 2014-11-12 | Disposition: A | Discharge status: Home / Self Care | Payer: Medicare HMO | Source: Ambulatory Visit | Attending: Orthopedic Surgery | Admitting: Orthopedic Surgery

## 2014-11-12 ENCOUNTER — Encounter (HOSPITAL_COMMUNITY): Payer: Self-pay

## 2014-11-12 DIAGNOSIS — J439 Emphysema, unspecified: Secondary | ICD-10-CM | POA: Insufficient documentation

## 2014-11-12 DIAGNOSIS — Z01818 Encounter for other preprocedural examination: Secondary | ICD-10-CM | POA: Diagnosis present

## 2014-11-12 DIAGNOSIS — Z01812 Encounter for preprocedural laboratory examination: Secondary | ICD-10-CM | POA: Insufficient documentation

## 2014-11-12 DIAGNOSIS — I700 Atherosclerosis of aorta: Secondary | ICD-10-CM | POA: Insufficient documentation

## 2014-11-12 DIAGNOSIS — K449 Diaphragmatic hernia without obstruction or gangrene: Secondary | ICD-10-CM | POA: Diagnosis not present

## 2014-11-12 HISTORY — DX: Urgency of urination: R39.15

## 2014-11-12 HISTORY — DX: Unspecified osteoarthritis, unspecified site: M19.90

## 2014-11-12 LAB — URINALYSIS, ROUTINE W REFLEX MICROSCOPIC
Bilirubin Urine: NEGATIVE
Glucose, UA: NEGATIVE mg/dL
Ketones, ur: NEGATIVE mg/dL
Nitrite: NEGATIVE
Protein, ur: 30 mg/dL — AB
Specific Gravity, Urine: 1.02 (ref 1.005–1.030)
Urobilinogen, UA: 0.2 mg/dL (ref 0.0–1.0)
pH: 6 (ref 5.0–8.0)

## 2014-11-12 LAB — COMPREHENSIVE METABOLIC PANEL
ALT: 13 U/L — ABNORMAL LOW (ref 14–54)
AST: 23 U/L (ref 15–41)
Albumin: 4.2 g/dL (ref 3.5–5.0)
Alkaline Phosphatase: 62 U/L (ref 38–126)
Anion gap: 8 (ref 5–15)
BUN: 21 mg/dL — ABNORMAL HIGH (ref 6–20)
CO2: 29 mmol/L (ref 22–32)
Calcium: 9.8 mg/dL (ref 8.9–10.3)
Chloride: 103 mmol/L (ref 101–111)
Creatinine, Ser: 0.82 mg/dL (ref 0.44–1.00)
GFR calc Af Amer: >60 mL/min (ref >60)
GFR calc non Af Amer: 59 mL/min — ABNORMAL LOW (ref >60)
Glucose, Bld: 102 mg/dL — ABNORMAL HIGH (ref 65–99)
Potassium: 3.7 mmol/L (ref 3.5–5.1)
Sodium: 140 mmol/L (ref 135–145)
Total Bilirubin: 0.9 mg/dL (ref 0.3–1.2)
Total Protein: 7.1 g/dL (ref 6.5–8.1)

## 2014-11-12 LAB — URINE MICROSCOPIC-ADD ON

## 2014-11-12 LAB — CBC WITH DIFFERENTIAL/PLATELET
Basophils Absolute: 0 10*3/uL (ref 0.0–0.1)
Basophils Relative: 0 % (ref 0–1)
Eosinophils Absolute: 0.1 10*3/uL (ref 0.0–0.7)
Eosinophils Relative: 1 % (ref 0–5)
HCT: 41 % (ref 36.0–46.0)
Hemoglobin: 13.3 g/dL (ref 12.0–15.0)
Lymphocytes Relative: 30 % (ref 12–46)
Lymphs Abs: 2.6 10*3/uL (ref 0.7–4.0)
MCH: 29.4 pg (ref 26.0–34.0)
MCHC: 32.4 g/dL (ref 30.0–36.0)
MCV: 90.7 fL (ref 78.0–100.0)
Monocytes Absolute: 0.5 10*3/uL (ref 0.1–1.0)
Monocytes Relative: 6 % (ref 3–12)
Neutro Abs: 5.5 10*3/uL (ref 1.7–7.7)
Neutrophils Relative %: 63 % (ref 43–77)
Platelets: 217 10*3/uL (ref 150–400)
RBC: 4.52 MIL/uL (ref 3.87–5.11)
RDW: 14.5 % (ref 11.5–15.5)
WBC: 8.7 10*3/uL (ref 4.0–10.5)

## 2014-11-12 LAB — APTT: aPTT: 39 seconds — ABNORMAL HIGH (ref 24–37)

## 2014-11-12 LAB — PROTIME-INR
INR: 1.1 (ref 0.00–1.49)
Prothrombin Time: 14.4 seconds (ref 11.6–15.2)

## 2014-11-12 LAB — SURGICAL PCR SCREEN
MRSA, PCR: NEGATIVE
Staphylococcus aureus: POSITIVE — AB

## 2014-11-12 NOTE — Progress Notes (Signed)
Chest x-ray requested from Dr. Rushie Chestnut Clover Mealy.

## 2014-11-12 NOTE — Pre-Procedure Instructions (Signed)
    MOZETTA MURFIN  11/12/2014      [id number] [place], [place] - [place] [address] [place] [address] Phone: [phone] Fax: [phone]  [facility] [address] [place] [address] [place] [address] Phone: [phone] Fax: [phone]    Your procedure is scheduled on   11-23-2014   Monday   Report to [facility] Admitting at 5:30 A.M.   Call this number if you have problems the morning of surgery:  [phone]   Remember:  Do not eat food or drink liquids after midnight.   Take these medicines the morning of surgery with A SIP OF WATER Tylenol if needed,celexa   Do not wear jewelry, make-up or nail polish.  Do not wear lotions, powders, or perfumes.  .  Do not shave 48 hours prior to surgery.     Do not bring valuables to the hospital.  [facility] is not responsible for any belongings or valuables.  Contacts, dentures or bridgework may not be worn into surgery.  Leave your suitcase in the car.  After surgery it may be brought to your room.  For patients admitted to the hospital, discharge time will be determined by your treatment team.     Special instructions:  See attached sheet for instructions on CHG  Shower/bath  Please read over the following fact sheets that you were given. Pain Booklet, Coughing and Deep Breathing and Surgical Site Infection Prevention

## 2014-11-12 NOTE — Progress Notes (Signed)
I called a prescription for Mupirocin ointment to [facility] [employer].

## 2014-11-13 LAB — URINE CULTURE: Culture: >100000

## 2014-11-23 ENCOUNTER — Inpatient Hospital Stay (HOSPITAL_COMMUNITY): Admission: RE | Admit: 2014-11-23 | Payer: Medicare HMO | Source: Ambulatory Visit | Admitting: Orthopedic Surgery

## 2014-11-23 ENCOUNTER — Encounter (HOSPITAL_COMMUNITY): Admission: RE | Payer: Self-pay | Source: Ambulatory Visit

## 2014-11-23 SURGERY — ARTHROPLASTY, KNEE, TOTAL
Anesthesia: Spinal | Site: Knee | Laterality: Left

## 2014-11-27 ENCOUNTER — Encounter (HOSPITAL_COMMUNITY): Payer: Self-pay

## 2014-11-27 ENCOUNTER — Inpatient Hospital Stay (HOSPITAL_COMMUNITY)
Admission: EM | Admit: 2014-11-27 | Discharge: 2014-12-02 | DRG: 863 | Disposition: A | Discharge status: Skilled Nursing Facility | Payer: Medicare HMO | Attending: Internal Medicine | Admitting: Internal Medicine

## 2014-11-27 DIAGNOSIS — T814XXD Infection following a procedure, subsequent encounter: Secondary | ICD-10-CM | POA: Diagnosis not present

## 2014-11-27 DIAGNOSIS — Z66 Do not resuscitate: Secondary | ICD-10-CM | POA: Diagnosis present

## 2014-11-27 DIAGNOSIS — Z90710 Acquired absence of both cervix and uterus: Secondary | ICD-10-CM

## 2014-11-27 DIAGNOSIS — Z888 Allergy status to other drugs, medicaments and biological substances status: Secondary | ICD-10-CM | POA: Diagnosis not present

## 2014-11-27 DIAGNOSIS — F329 Major depressive disorder, single episode, unspecified: Secondary | ICD-10-CM | POA: Diagnosis not present

## 2014-11-27 DIAGNOSIS — L03115 Cellulitis of right lower limb: Secondary | ICD-10-CM | POA: Diagnosis present

## 2014-11-27 DIAGNOSIS — T8131XA Disruption of external operation (surgical) wound, not elsewhere classified, initial encounter: Secondary | ICD-10-CM | POA: Diagnosis not present

## 2014-11-27 DIAGNOSIS — Z9851 Tubal ligation status: Secondary | ICD-10-CM

## 2014-11-27 DIAGNOSIS — E039 Hypothyroidism, unspecified: Secondary | ICD-10-CM | POA: Diagnosis present

## 2014-11-27 DIAGNOSIS — L02419 Cutaneous abscess of limb, unspecified: Secondary | ICD-10-CM | POA: Diagnosis not present

## 2014-11-27 DIAGNOSIS — T814XXA Infection following a procedure, initial encounter: Principal | ICD-10-CM | POA: Diagnosis present

## 2014-11-27 DIAGNOSIS — L03119 Cellulitis of unspecified part of limb: Secondary | ICD-10-CM | POA: Diagnosis present

## 2014-11-27 DIAGNOSIS — F32A Depression, unspecified: Secondary | ICD-10-CM | POA: Diagnosis present

## 2014-11-27 DIAGNOSIS — T8149XA Infection following a procedure, other surgical site, initial encounter: Secondary | ICD-10-CM | POA: Diagnosis present

## 2014-11-27 DIAGNOSIS — Z886 Allergy status to analgesic agent status: Secondary | ICD-10-CM

## 2014-11-27 DIAGNOSIS — L97919 Non-pressure chronic ulcer of unspecified part of right lower leg with unspecified severity: Secondary | ICD-10-CM

## 2014-11-27 DIAGNOSIS — L02415 Cutaneous abscess of right lower limb: Secondary | ICD-10-CM | POA: Diagnosis present

## 2014-11-27 DIAGNOSIS — M1990 Unspecified osteoarthritis, unspecified site: Secondary | ICD-10-CM | POA: Diagnosis present

## 2014-11-27 DIAGNOSIS — I10 Essential (primary) hypertension: Secondary | ICD-10-CM | POA: Diagnosis present

## 2014-11-27 DIAGNOSIS — E785 Hyperlipidemia, unspecified: Secondary | ICD-10-CM | POA: Diagnosis present

## 2014-11-27 DIAGNOSIS — T798XXA Other early complications of trauma, initial encounter: Secondary | ICD-10-CM

## 2014-11-27 LAB — BASIC METABOLIC PANEL
Anion gap: 10 (ref 5–15)
BUN: 23 mg/dL — ABNORMAL HIGH (ref 6–20)
CO2: 28 mmol/L (ref 22–32)
Calcium: 8.9 mg/dL (ref 8.9–10.3)
Chloride: 97 mmol/L — ABNORMAL LOW (ref 101–111)
Creatinine, Ser: 0.75 mg/dL (ref 0.44–1.00)
GFR calc Af Amer: >60 mL/min (ref >60)
GFR calc non Af Amer: >60 mL/min (ref >60)
Glucose, Bld: 111 mg/dL — ABNORMAL HIGH (ref 65–99)
Potassium: 4.6 mmol/L (ref 3.5–5.1)
Sodium: 135 mmol/L (ref 135–145)

## 2014-11-27 LAB — CBC WITH DIFFERENTIAL/PLATELET
Basophils Absolute: 0 10*3/uL (ref 0.0–0.1)
Basophils Relative: 0 % (ref 0–1)
Eosinophils Absolute: 0.2 10*3/uL (ref 0.0–0.7)
Eosinophils Relative: 2 % (ref 0–5)
HCT: 38.5 % (ref 36.0–46.0)
Hemoglobin: 12.4 g/dL (ref 12.0–15.0)
Lymphocytes Relative: 14 % (ref 12–46)
Lymphs Abs: 1.7 10*3/uL (ref 0.7–4.0)
MCH: 29.8 pg (ref 26.0–34.0)
MCHC: 32.2 g/dL (ref 30.0–36.0)
MCV: 92.5 fL (ref 78.0–100.0)
Monocytes Absolute: 0.9 10*3/uL (ref 0.1–1.0)
Monocytes Relative: 7 % (ref 3–12)
Neutro Abs: 9.5 10*3/uL — ABNORMAL HIGH (ref 1.7–7.7)
Neutrophils Relative %: 77 % (ref 43–77)
Platelets: 222 10*3/uL (ref 150–400)
RBC: 4.16 MIL/uL (ref 3.87–5.11)
RDW: 15 % (ref 11.5–15.5)
WBC: 12.3 10*3/uL — ABNORMAL HIGH (ref 4.0–10.5)

## 2014-11-27 LAB — URINALYSIS, ROUTINE W REFLEX MICROSCOPIC
Bilirubin Urine: NEGATIVE
Glucose, UA: NEGATIVE mg/dL
Hgb urine dipstick: NEGATIVE
Ketones, ur: NEGATIVE mg/dL
Nitrite: NEGATIVE
Protein, ur: NEGATIVE mg/dL
Specific Gravity, Urine: 1.015 (ref 1.005–1.030)
Urobilinogen, UA: 0.2 mg/dL (ref 0.0–1.0)
pH: 6.5 (ref 5.0–8.0)

## 2014-11-27 LAB — URINE MICROSCOPIC-ADD ON

## 2014-11-27 MED ORDER — VANCOMYCIN HCL IN DEXTROSE 1-5 GM/200ML-% IV SOLN
1000.0000 mg | INTRAVENOUS | Status: AC
Start: 2014-11-27 — End: 2014-11-27
  Administered 2014-11-27: 1000 mg via INTRAVENOUS
  Filled 2014-11-27: qty 200

## 2014-11-27 MED ORDER — LOSARTAN POTASSIUM 50 MG PO TABS
50.0000 mg | ORAL_TABLET | Freq: Every day | ORAL | Status: DC
  Administered 2014-11-29 – 2014-12-02 (×4): 50 mg via ORAL
  Filled 2014-11-27 (×5): qty 1

## 2014-11-27 MED ORDER — CALCIUM CARBONATE 1250 (500 CA) MG PO TABS
1.0000 | ORAL_TABLET | Freq: Every day | ORAL | Status: DC
  Administered 2014-11-28 – 2014-12-02 (×6): 500 mg via ORAL
  Filled 2014-11-27 (×6): qty 1

## 2014-11-27 MED ORDER — CITALOPRAM HYDROBROMIDE 10 MG PO TABS
10.0000 mg | ORAL_TABLET | Freq: Every day | ORAL | Status: DC
  Administered 2014-11-28 – 2014-12-02 (×5): 10 mg via ORAL
  Filled 2014-11-27 (×5): qty 1

## 2014-11-27 MED ORDER — CALCIUM-MAGNESIUM 300-300 MG PO TABS: 2.0000 | ORAL_TABLET | Freq: Every day | ORAL | Status: DC

## 2014-11-27 MED ORDER — OMEGA-3-ACID ETHYL ESTERS 1 G PO CAPS
1.0000 g | ORAL_CAPSULE | Freq: Every day | ORAL | Status: DC
  Administered 2014-11-28 – 2014-12-01 (×4): 1 g via ORAL
  Filled 2014-11-27 (×5): qty 1

## 2014-11-27 MED ORDER — OXYCODONE-ACETAMINOPHEN 5-325 MG PO TABS: 2.0000 | ORAL_TABLET | ORAL | Status: DC | PRN

## 2014-11-27 MED ORDER — TRIAMCINOLONE ACETONIDE 0.1 % EX CREA
1.0000 "application " | TOPICAL_CREAM | Freq: Every day | CUTANEOUS | Status: DC | PRN
  Filled 2014-11-27: qty 15

## 2014-11-27 MED ORDER — RISAQUAD PO CAPS
ORAL_CAPSULE | Freq: Two times a day (BID) | ORAL | Status: DC
  Administered 2014-11-28 – 2014-12-02 (×9): 1 via ORAL
  Filled 2014-11-27 (×11): qty 1

## 2014-11-27 MED ORDER — PROSIGHT PO TABS
1.0000 | ORAL_TABLET | Freq: Every day | ORAL | Status: DC
  Administered 2014-11-28 – 2014-12-01 (×4): 1 via ORAL
  Filled 2014-11-27 (×5): qty 1

## 2014-11-27 MED ORDER — MUPIROCIN 2 % EX OINT
1.0000 "application " | TOPICAL_OINTMENT | Freq: Two times a day (BID) | CUTANEOUS | Status: DC | PRN
  Filled 2014-11-27: qty 22

## 2014-11-27 MED ORDER — ACETAMINOPHEN 325 MG PO TABS
650.0000 mg | ORAL_TABLET | ORAL | Status: DC | PRN
  Administered 2014-12-01 – 2014-12-02 (×2): 650 mg via ORAL
  Filled 2014-11-27 (×2): qty 2

## 2014-11-27 MED ORDER — TRAMADOL HCL 50 MG PO TABS
50.0000 mg | ORAL_TABLET | Freq: Two times a day (BID) | ORAL | Status: DC | PRN
Start: 2014-11-27 — End: 2014-12-02
  Administered 2014-12-01: 50 mg via ORAL
  Filled 2014-11-27: qty 1

## 2014-11-27 MED ORDER — OXYCODONE-ACETAMINOPHEN 5-325 MG PO TABS
0.5000 | ORAL_TABLET | ORAL | Status: DC | PRN
  Administered 2014-11-28 (×2): 0.5 via ORAL
  Administered 2014-11-29 – 2014-11-30 (×2): 1 via ORAL
  Administered 2014-11-30: 0.5 via ORAL
  Administered 2014-12-01: 1 via ORAL
  Administered 2014-12-01: 0.5 via ORAL
  Filled 2014-11-27 (×8): qty 1

## 2014-11-27 MED ORDER — VITAMIN D 1000 UNITS PO TABS
2000.0000 [IU] | ORAL_TABLET | Freq: Every day | ORAL | Status: DC
  Administered 2014-11-28 – 2014-12-01 (×4): 2000 [IU] via ORAL
  Filled 2014-11-27 (×5): qty 2

## 2014-11-27 MED ORDER — SODIUM CHLORIDE 0.9 % IV SOLN
500.0000 mg | INTRAVENOUS | Status: DC
  Administered 2014-11-28: 500 mg via INTRAVENOUS
  Filled 2014-11-27: qty 500

## 2014-11-27 MED ORDER — ONDANSETRON HCL 4 MG/2ML IJ SOLN
4.0000 mg | Freq: Once | INTRAMUSCULAR | Status: AC
  Administered 2014-11-27: 4 mg via INTRAVENOUS
  Filled 2014-11-27: qty 2

## 2014-11-27 MED ORDER — KRILL OIL 1000 MG PO CAPS: 1000.0000 mg | ORAL_CAPSULE | Freq: Every day | ORAL | Status: DC

## 2014-11-27 MED ORDER — HYDROCODONE-ACETAMINOPHEN 5-325 MG PO TABS: 1.0000 | ORAL_TABLET | ORAL | Status: DC | PRN

## 2014-11-27 MED ORDER — MAGNESIUM OXIDE 400 (241.3 MG) MG PO TABS
400.0000 mg | ORAL_TABLET | Freq: Every day | ORAL | Status: DC
  Administered 2014-11-28 – 2014-12-02 (×6): 400 mg via ORAL
  Filled 2014-11-27 (×6): qty 1

## 2014-11-27 MED ORDER — LUTEIN 20 MG PO CAPS: 20.0000 mg | ORAL_CAPSULE | Freq: Every day | ORAL | Status: DC

## 2014-11-27 MED ORDER — MORPHINE SULFATE 4 MG/ML IJ SOLN
2.0000 mg | Freq: Once | INTRAMUSCULAR | Status: AC
  Administered 2014-11-27: 2 mg via INTRAVENOUS
  Filled 2014-11-27: qty 1

## 2014-11-27 MED ORDER — SODIUM CHLORIDE 0.9 % IV BOLUS (SEPSIS)
500.0000 mL | Freq: Once | INTRAVENOUS | Status: AC
  Administered 2014-11-27: 500 mL via INTRAVENOUS

## 2014-11-27 MED ORDER — CYCLOBENZAPRINE HCL 5 MG PO TABS
5.0000 mg | ORAL_TABLET | Freq: Every day | ORAL | Status: DC
  Administered 2014-11-28 – 2014-12-01 (×4): 5 mg via ORAL
  Filled 2014-11-27 (×6): qty 1

## 2014-11-27 MED ORDER — ADULT MULTIVITAMIN W/MINERALS CH
1.0000 | ORAL_TABLET | Freq: Two times a day (BID) | ORAL | Status: DC
  Administered 2014-11-27 – 2014-12-01 (×9): 1 via ORAL
  Filled 2014-11-27 (×11): qty 1

## 2014-11-27 MED ORDER — HEPARIN SODIUM (PORCINE) 5000 UNIT/ML IJ SOLN
5000.0000 [IU] | Freq: Three times a day (TID) | INTRAMUSCULAR | Status: DC
  Administered 2014-11-28: 5000 [IU] via SUBCUTANEOUS
  Filled 2014-11-27 (×5): qty 1

## 2014-11-27 MED ORDER — SULFAMETHOXAZOLE-TRIMETHOPRIM 800-160 MG PO TABS: 1.0000 | ORAL_TABLET | Freq: Two times a day (BID) | ORAL | Status: DC

## 2014-11-27 NOTE — ED Provider Notes (Addendum)
Discussed with PA Tapia.  Treatment initiated. I examined the patient. She has 2 sutured lacerations on the anterior mid right lower leg. Soft tissue swelling to the toes with erythema to the dorsum of the foot, and drainage from each wound. She is not febrile. She does not have leukocytosis. She has had 5 doses of outpatient Keflex and continued to progress. My recommendation is admission for IV antibiotics. Given IV vancomycin here.  Tanna Furry, MD 11/27/14 2256  Tanna Furry, MD 12/02/14 1900

## 2014-11-27 NOTE — ED Notes (Signed)
Pt will notify staff when need to urinate arises.

## 2014-11-27 NOTE — ED Notes (Signed)
Pt presents with c/o post-op complication. Per family, pt had some surgery on her right lower leg to have some squamous cells removed. Site is now draining pus and is red. Family reports the incision is starting to open up.

## 2014-11-27 NOTE — ED Notes (Signed)
RN drawing labs with iv start

## 2014-11-27 NOTE — Progress Notes (Addendum)
ANTIBIOTIC CONSULT NOTE - INITIAL  Pharmacy Consult for vancomcyin Indication: cellulitis  Allergies  Allergen Reactions  . Naproxen     Gi bleed  . Nsaids   . Ace Inhibitors     Cough   . Aspirin     GI bleed    Patient Measurements:   Adjusted Body Weight:   Vital Signs: Temp: 98.2 F (36.8 C) (07/01 1758) Temp Source: Oral (07/01 1758) BP: 147/99 mmHg (07/01 1758) Pulse Rate: 88 (07/01 1930) Intake/Output from previous day:   Intake/Output from this shift:    Labs:  Recent Labs  11/27/14 1906  WBC 12.3*  HGB 12.4  PLT 222  CREATININE 0.75   CrCl cannot be calculated (Unknown ideal weight.). No results for input(s): VANCOTROUGH, VANCOPEAK, VANCORANDOM, GENTTROUGH, GENTPEAK, GENTRANDOM, TOBRATROUGH, TOBRAPEAK, TOBRARND, AMIKACINPEAK, AMIKACINTROU, AMIKACIN in the last 72 hours.   Microbiology: Recent Results (from the past 720 hour(s))  Surgical pcr screen     Status: Abnormal   Collection Time: 11/12/14 11:49 AM  Result Value Ref Range Status   MRSA, PCR NEGATIVE NEGATIVE Final   Staphylococcus aureus POSITIVE (A) NEGATIVE Final    Comment:        The Xpert SA Assay (FDA approved for NASAL specimens in patients over 79 years of age), is one component of a comprehensive surveillance program.  Test performance has been validated by [facility] for patients greater than or equal to 79 year old. It is not intended to diagnose infection nor to guide or monitor treatment.   Urine culture     Status: None   Collection Time: 11/12/14 11:50 AM  Result Value Ref Range Status   Specimen Description URINE, CLEAN CATCH  Final   Special Requests NONE  Final   Culture >100,000 CFU VIRIDANS STREPTOCOCCUS  Final   Report Status 11/13/2014 FINAL  Final    Medical History: Past Medical History  Diagnosis Date  . BACK PAIN 03/08/2007  . DEPRESSIVE DISORDER 12/01/2008  . HYPERLIPIDEMIA 03/09/2006  . HYPERTENSION 03/09/2006  . KNEE PAIN 05/06/2009  . GI  bleed 09/12/2010    Naproxen induced Endoscopy with dr Harlene Ramus   . Urinary urgency   . HIATAL HERNIA 12/17/2006  . Arthritis   . Hypothyroidism    Assessment: 81 YOF presents with recent squamous cell excisional biopsies on leg.  Biopsy sites now with redness, swelling, pain and drainage. Pharmacy asked to dose vancomycin  7/1 >> vancomycin >>    7/1 blood:   Dose changes/levels:   Goal of Therapy:  Vancomycin trough level 10-15 mcg/ml  Plan:   Vancomycin 1gm IV x1 then 500mg  IV q24h  Check vancomycin trough if remains on vancomycin   Follow renal function  Doreene Eland, PharmD, BCPS.   Pager: [phone] 11/27/2014,8:20 PM

## 2014-11-27 NOTE — H&P (Signed)
[facility] History and Physical  [name]:[id number] DOB: 07-23-1917 DOA: 11/27/2014  Referring physician: EDP PCP: Garret Reddish, MD   Chief Complaint: Wound infection   HPI: Vanessa Mason is a 81 y.o. 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  Patient presented to her derm office yesterday with erythema, drainage, of area of surgery.  Patient started on Keflex at that time.  Today the wounds look significantly worse with purulent drainage, surrounding erythema, and some dehiscence of the lower wound sutures.  Review of Systems: Systems reviewed.  As above, otherwise negative  Past Medical History  Diagnosis Date  . BACK PAIN 03/08/2007  . DEPRESSIVE DISORDER 12/01/2008  . HYPERLIPIDEMIA 03/09/2006  . HYPERTENSION 03/09/2006  . KNEE PAIN 05/06/2009  . GI bleed 09/12/2010    Naproxen induced Endoscopy with dr Harlene Ramus   . Urinary urgency   . HIATAL HERNIA 12/17/2006  . Arthritis   . Hypothyroidism    Past Surgical History  Procedure Laterality Date  . Abdominal hysterectomy    . Oophorectomy    . Thyroidectomy    . Rotator cuff repair    . Laminectomy      with fusion  . Cardiac catheterization    . Arthroscopic knee Right   . Hernia repair    . Tubal ligation    . Resection distal clavical    . Back surgery      spinal fusion   Social History:  reports that she has never smoked. She does not have any smokeless tobacco history on file. She reports that she does not use illicit drugs. Her alcohol history is not on file.  Allergies  Allergen Reactions  . Naproxen     Gi bleed  . Nsaids   . Ace Inhibitors     Cough   . Aspirin     GI bleed    Family History  Problem Relation Age of Onset  . Cancer Mother 24  . Cancer Brother      Prior to Admission medications   Medication Sig Start Date End Date Taking? Authorizing Provider  acetaminophen (TYLENOL) 650  MG CR tablet Take 650 mg by mouth as needed (for pain).    Yes Historical Provider, MD  Calcium-Magnesium 300-300 MG TABS Take 2 tablets by mouth daily.   Yes Historical Provider, MD  cephALEXin (KEFLEX) 500 MG capsule Take 500 mg by mouth every 6 (six) hours. 11/26/14  Yes Historical Provider, MD  Cholecalciferol (VITAMIN D) 2000 UNITS CAPS Take 2,000 Units by mouth daily.    Yes Historical Provider, MD  citalopram (CELEXA) 10 MG tablet Take 1 tablet (10 mg total) by mouth daily. 07/30/14  Yes Marin Olp, MD  cyclobenzaprine (FLEXERIL) 10 MG tablet TAKE ONE-HALF TABLET BY MOUTH AT BEDTIME Patient taking differently: Take 5 mg by mouth at bedtime.  02/25/14  Yes Marin Olp, MD  HYDROcodone-acetaminophen (NORCO/VICODIN) 5-325 MG per tablet Take 1 tablet by mouth every 4 (four) hours as needed for moderate pain or severe pain.  11/24/14  Yes Historical Provider, MD  KRILL OIL 1000 MG CAPS Take 1,000 mg by mouth daily.    Yes Historical Provider, MD  losartan (COZAAR) 50 MG tablet Take 1 tablet (50 mg total) by mouth daily. 12/16/13  Yes Bruce Kendall Flack, MD  Lutein 20 MG CAPS Take 20 mg by mouth daily.    Yes Historical Provider, MD  Multiple  Vitamin (MULTIVITAMIN) tablet Take 1 tablet by mouth 2 (two) times daily.    Yes Historical Provider, MD  mupirocin ointment (BACTROBAN) 2 % Apply 1 application topically 2 (two) times daily as needed (skin infection).  11/12/14  Yes Historical Provider, MD  PHOSPHATIDYLSERINE PO Take 1,000 mg by mouth 2 (two) times daily.   Yes Historical Provider, MD  Probiotic Product (PROBIOTIC DAILY PO) Take 1 tablet by mouth 2 (two) times daily.    Yes Historical Provider, MD  triamcinolone cream (KENALOG) 0.1 % Apply 1 application topically daily as needed (skin irritation).  11/23/14  Yes Historical Provider, MD  traMADol (ULTRAM) 50 MG tablet Take 50 mg by mouth 2 (two) times daily as needed for moderate pain.  11/24/14   Historical Provider, MD   Physical Exam: Filed  Vitals:   11/27/14 2026  BP: 121/68  Pulse: 97  Temp:   Resp: 17    BP 121/68 mmHg  Pulse 97  Temp(Src) 98.2 F (36.8 C) (Oral)  Resp 17  SpO2 99%  General Appearance:    Alert, oriented, no distress, appears stated age  Head:    Normocephalic, atraumatic  Eyes:    PERRL, EOMI, sclera non-icteric        Nose:   Nares without drainage or epistaxis. Mucosa, turbinates normal  Throat:   Moist mucous membranes. Oropharynx without erythema or exudate.  Neck:   Supple. No carotid bruits.  No thyromegaly.  No lymphadenopathy.   Back:     No CVA tenderness, no spinal tenderness  Lungs:     Clear to auscultation bilaterally, without wheezes, rhonchi or rales  Chest wall:    No tenderness to palpitation  Heart:    Regular rate and rhythm without murmurs, gallops, rubs  Abdomen:     Soft, non-tender, nondistended, normal bowel sounds, no organomegaly  Genitalia:    deferred  Rectal:    deferred  Extremities:   No clubbing, cyanosis or edema.  Pulses:   2+ and symmetric all extremities  Skin:   Skin color, texture, turgor normal, no rashes or lesions  Lymph nodes:   Cervical, supraclavicular, and axillary nodes normal  Neurologic:   CNII-XII intact. Normal strength, sensation and reflexes      throughout    Labs on Admission:  Basic Metabolic Panel:  Recent Labs Lab 11/27/14 1906  NA 135  K 4.6  CL 97*  CO2 28  GLUCOSE 111*  BUN 23*  CREATININE 0.75  CALCIUM 8.9   Liver Function Tests: No results for input(s): AST, ALT, ALKPHOS, BILITOT, PROT, ALBUMIN in the last 168 hours. No results for input(s): LIPASE, AMYLASE in the last 168 hours. No results for input(s): AMMONIA in the last 168 hours. CBC:  Recent Labs Lab 11/27/14 1906  WBC 12.3*  NEUTROABS 9.5*  HGB 12.4  HCT 38.5  MCV 92.5  PLT 222   Cardiac Enzymes: No results for input(s): CKTOTAL, CKMB, CKMBINDEX, TROPONINI in the last 168 hours.  BNP (last 3 results) No results for input(s): PROBNP in the last  8760 hours. CBG: No results for input(s): GLUCAP in the last 168 hours.  Radiological Exams on Admission: No results found.  EKG: Independently reviewed.  Assessment/Plan Principal Problem:   Cellulitis and abscess of leg Active Problems:   Surgical wound infection   Dehiscence of external surgical wound   1. Cellulitis and abscess of leg - 1. Wound culture 2. BCx 3. vanc 4. Wound care consult and wound dressings 5. Surgery consult  if needed, appears to already be draining however. 6. Pain control with percocet for now, caution, patient seems very sensitive to opiates.    Code Status: DNR status verified with family and patient  Family Communication: Family at bedside Disposition Plan: Admit to inpatient   Time spent: 70 min  GARDNER, JARED M. [facility] Pager [phone]  If 7AM-7PM, please contact the day team taking care of the patient [email or web address] Password [facility] 11/27/2014, 11:29 PM

## 2014-11-27 NOTE — ED Provider Notes (Signed)
CSN: [id number]     Arrival date & time 11/27/14  1754 History   First MD Initiated Contact with Patient 11/27/14 1805     Chief Complaint  Patient presents with  . Post-op Problem     (Consider location/radiation/quality/duration/timing/severity/associated sxs/prior Treatment) HPI   Vanessa Mason is a 81 year old female with history of hypertension, hyperlipidemia, hypothyroid, and recent MOH's procedure for squamous biopsy performed 5 days ago, who presents to the emergency department with increasing redness, swelling, pain, drainage and some dehiscence of the sutures.  She has been reevaluated 2 times by the physician who treated her, was put on Keflex yesterday, 500 mg q 6 hours, and has completed 24 hours of Keflex, but reports increasing redness and swelling.  The physician is Dr. Izora Ribas, documentation is not found in our records, but the daughter at the bedside reports they've been sent here for a CBC, BMP, blood cultures and IV anabiotic's including vancomycin.   She had one episode of brief nausea, but has had not vomiting, and she further denies any fever, chills, sweats, palpitations, chest pain, shortness of breath.  She has severe pain with minor movements of her leg, and extreme pain when trying to walk. She has had some relief with a quarter of a 5-325 percocet at home.    Past Medical History  Diagnosis Date  . BACK PAIN 03/08/2007  . DEPRESSIVE DISORDER 12/01/2008  . HYPERLIPIDEMIA 03/09/2006  . HYPERTENSION 03/09/2006  . KNEE PAIN 05/06/2009  . GI bleed 09/12/2010    Naproxen induced Endoscopy with dr Harlene Ramus   . Urinary urgency   . HIATAL HERNIA 12/17/2006  . Arthritis   . Hypothyroidism    Past Surgical History  Procedure Laterality Date  . Abdominal hysterectomy    . Oophorectomy    . Thyroidectomy    . Rotator cuff repair    . Laminectomy      with fusion  . Cardiac catheterization    . Arthroscopic knee Right   . Hernia repair    . Tubal ligation    .  Resection distal clavical    . Back surgery      spinal fusion   Family History  Problem Relation Age of Onset  . Cancer Mother 53  . Cancer Brother    History  Substance Use Topics  . Smoking status: Never Smoker   . Smokeless tobacco: Not on file  . Alcohol Use: Not on file     Comment: wine occasionally @ dinner   OB History    No data available     Review of Systems  Constitutional: Negative.   HENT: Negative.   Cardiovascular: Negative.   Gastrointestinal: Negative.   Genitourinary: Negative.   Musculoskeletal: Positive for myalgias.  Skin: Positive for wound.  Neurological: Negative.   Psychiatric/Behavioral: Negative.    Allergies  Naproxen; Nsaids; Ace inhibitors; and Aspirin  Home Medications   Prior to Admission medications   Medication Sig Start Date End Date Taking? Authorizing Provider  acetaminophen (TYLENOL) 650 MG CR tablet Take 650 mg by mouth as needed (for pain).    Yes Historical Provider, MD  Calcium-Magnesium 300-300 MG TABS Take 2 tablets by mouth daily.   Yes Historical Provider, MD  cephALEXin (KEFLEX) 500 MG capsule Take 500 mg by mouth every 6 (six) hours. 11/26/14  Yes Historical Provider, MD  Cholecalciferol (VITAMIN D) 2000 UNITS CAPS Take 2,000 Units by mouth daily.    Yes Historical Provider, MD  citalopram (CELEXA) 10 MG  tablet Take 1 tablet (10 mg total) by mouth daily. 07/30/14  Yes Marin Olp, MD  cyclobenzaprine (FLEXERIL) 10 MG tablet TAKE ONE-HALF TABLET BY MOUTH AT BEDTIME Patient taking differently: Take 5 mg by mouth at bedtime.  02/25/14  Yes Marin Olp, MD  HYDROcodone-acetaminophen (NORCO/VICODIN) 5-325 MG per tablet Take 1 tablet by mouth every 4 (four) hours as needed for moderate pain or severe pain.  11/24/14  Yes Historical Provider, MD  KRILL OIL 1000 MG CAPS Take 1,000 mg by mouth daily.    Yes Historical Provider, MD  losartan (COZAAR) 50 MG tablet Take 1 tablet (50 mg total) by mouth daily. 12/16/13  Yes Bruce  Kendall Flack, MD  Lutein 20 MG CAPS Take 20 mg by mouth daily.    Yes Historical Provider, MD  Multiple Vitamin (MULTIVITAMIN) tablet Take 1 tablet by mouth 2 (two) times daily.    Yes Historical Provider, MD  mupirocin ointment (BACTROBAN) 2 % Apply 1 application topically 2 (two) times daily as needed (skin infection).  11/12/14  Yes Historical Provider, MD  PHOSPHATIDYLSERINE PO Take 1,000 mg by mouth 2 (two) times daily.   Yes Historical Provider, MD  Probiotic Product (PROBIOTIC DAILY PO) Take 1 tablet by mouth 2 (two) times daily.    Yes Historical Provider, MD  triamcinolone cream (KENALOG) 0.1 % Apply 1 application topically daily as needed (skin irritation).  11/23/14  Yes Historical Provider, MD  traMADol (ULTRAM) 50 MG tablet Take 50 mg by mouth 2 (two) times daily as needed for moderate pain.  11/24/14   Historical Provider, MD   BP 133/56 mmHg  Pulse 82  Temp(Src) 98.7 F (37.1 C) (Oral)  Resp 16  Ht 4' 11.5" (1.511 m)  Wt 128 lb 1.4 oz (58.1 kg)  BMI 25.45 kg/m2  SpO2 92% Physical Exam  Constitutional: She is oriented to person, place, and time. She appears well-developed and well-nourished. No distress.  HENT:  Head: Normocephalic and atraumatic.  Right Ear: External ear normal.  Left Ear: External ear normal.  Nose: Nose normal.  Mouth/Throat: Oropharynx is clear and moist.  Eyes: Conjunctivae and EOM are normal. Pupils are equal, round, and reactive to light. Right eye exhibits no discharge. Left eye exhibits no discharge. No scleral icterus.  Neck: Normal range of motion. No JVD present. No tracheal deviation present. No thyromegaly present.  Cardiovascular: Normal rate, regular rhythm, normal heart sounds and intact distal pulses.  Exam reveals no gallop and no friction rub.   No murmur heard. Pulmonary/Chest: Effort normal and breath sounds normal. No accessory muscle usage or stridor. No tachypnea. No respiratory distress. She has no decreased breath sounds. She has no  wheezes. She has no rhonchi. She has no rales. She exhibits no tenderness.  Abdominal: Soft. Bowel sounds are normal. She exhibits no distension and no mass. There is no tenderness. There is no rebound and no guarding.  Musculoskeletal: Normal range of motion. She exhibits no edema or tenderness.  Right lower extremity -  Two linear wound with sutures running along her shin Diffuse redness and swelling extending from entire right foot up to level of tibial tuberosity Serosanguinous drainage from both wounds, with surrounding yellow moist tissue, multiple areas of suture dehiscence, without any gaping of wound. TTP, no area of fluctuance palpated  Lymphadenopathy:    She has no cervical adenopathy.  Neurological: She is alert and oriented to person, place, and time. She has normal reflexes. No cranial nerve deficit. She exhibits normal  muscle tone.  Skin: Skin is warm and dry. No rash noted. She is not diaphoretic. No erythema. No pallor.  Psychiatric: She has a normal mood and affect. Her speech is normal and behavior is normal. Judgment and thought content normal. Cognition and memory are normal.  Nursing note and vitals reviewed.   ED Course  Procedures (including critical care time) Labs Review Labs Reviewed  CBC WITH DIFFERENTIAL/PLATELET - Abnormal; Notable for the following:    WBC 12.3 (*)    Neutro Abs 9.5 (*)    All other components within normal limits  BASIC METABOLIC PANEL - Abnormal; Notable for the following:    Chloride 97 (*)    Glucose, Bld 111 (*)    BUN 23 (*)    All other components within normal limits  URINALYSIS, ROUTINE W REFLEX MICROSCOPIC (NOT AT [facility]) - Abnormal; Notable for the following:    APPearance CLOUDY (*)    Leukocytes, UA LARGE (*)    All other components within normal limits  URINE MICROSCOPIC-ADD ON - Abnormal; Notable for the following:    Bacteria, UA FEW (*)    All other components within normal limits  CULTURE, BLOOD (ROUTINE X 2)   CULTURE, BLOOD (ROUTINE X 2)  WOUND CULTURE    Imaging Review No results found.   EKG Interpretation None      MDM   Final diagnoses:  Wound infection, initial encounter  Cellulitis of right lower extremity   Postop wound infection/cellulitis, with serous drainage, no obvious area of induration, exquisitely tender to palpation, and diffusely erythematous and edematous.  No signs of sepsis, no current nausea, vomiting, fever, chills, sweats.  Has been tolerating meds and food.  Plan to get CBC, BMP, Ua, blood cultures 2 per request of the family, IV with fluids and after getting kidney function, plan to give 1 g and Vanc.  Labs pertinent for leukocytosis, BUN 23, mildly elevated from her last labs  Dr. Jeneen Rinks has seen and evaluated the pt and will admit for further IV abx and wound eval.  Pt and family updated with plan - Please see his documentation  Dr. Alcario Drought to admit      Delsa Grana, PA-C 11/28/14 0140  Tanna Furry, MD 12/02/14 1900

## 2014-11-28 DIAGNOSIS — T814XXA Infection following a procedure, initial encounter: Principal | ICD-10-CM

## 2014-11-28 LAB — GRAM STAIN: Special Requests: NORMAL

## 2014-11-28 MED ORDER — PIPERACILLIN-TAZOBACTAM 3.375 G IVPB
3.3750 g | Freq: Three times a day (TID) | INTRAVENOUS | Status: DC
  Administered 2014-11-28: 3.375 g via INTRAVENOUS
  Filled 2014-11-28 (×2): qty 50

## 2014-11-28 MED ORDER — ENOXAPARIN SODIUM 40 MG/0.4ML ~~LOC~~ SOLN
40.0000 mg | SUBCUTANEOUS | Status: DC
  Administered 2014-11-28 – 2014-12-02 (×5): 40 mg via SUBCUTANEOUS
  Filled 2014-11-28 (×5): qty 0.4

## 2014-11-28 MED ORDER — POLYETHYLENE GLYCOL 3350 17 G PO PACK
17.0000 g | PACK | Freq: Every day | ORAL | Status: DC | PRN
  Administered 2014-11-30: 17 g via ORAL
  Filled 2014-11-28: qty 1

## 2014-11-28 MED ORDER — CEFAZOLIN SODIUM 1-5 GM-% IV SOLN
1.0000 g | Freq: Three times a day (TID) | INTRAVENOUS | Status: DC
  Administered 2014-11-28 – 2014-11-30 (×5): 1 g via INTRAVENOUS
  Filled 2014-11-28 (×6): qty 50

## 2014-11-28 NOTE — Plan of Care (Signed)
Problem: Phase I Progression Outcomes Goal: Wound assessment- dressing change as appropriate Outcome: Completed/Met Date Met:  11/28/14 WOC RN consult completed and dressing orders placed.

## 2014-11-28 NOTE — Plan of Care (Deleted)
Problem: Discharge Progression Outcomes Goal: Wound improving/decreased edema Outcome: Progressing Instructed/educated pt. on wet to dry abdominal dressing changes to be done at home twice a day. Instructed pt. On G/J Tube dressing changes to be done daily cleanse area with soap and water, apply neosporin at insertion site and dress with split gauze dressing and tape.

## 2014-11-28 NOTE — Progress Notes (Signed)
Utilization review completed.  

## 2014-11-28 NOTE — Progress Notes (Signed)
Right leg wound gram stain results, Gram + Cocci results called to MD.

## 2014-11-28 NOTE — Consult Note (Addendum)
WOC wound consult note Reason for Consult:dehisced surgical incision on RLE Wound type:surgical Pressure Ulcer POA: No Measurement: Two incision lines, generally closely approximated, but with open areas.  Proximal measures 7cm with 1.5cm x 1cm are of yellow necrotic slough in center.  Distal incision measures 6cm with a 4cm x 4cm necrotic area in the center (black eschar, gray tissue and yellow necrotic slough) Wound bed:As described above. Drainage (amount, consistency, odor) light yellow exudate Periwound:edematous and erythematous (although according to family, this is improving with elevation and antibiotics) Dressing procedure/placement/frequency: Systemic antibiotics are in place.  I will add local antimicrobial absorbant dressings (silver hydrofiber) and start daily changes.  As a preventive measure, we will add the sacral silicone foam dressing to her coccyx and a pressure redistribution cushion for her chair. [facility] nursing team will not follow, but will remain available to this patient, the nursing and medical teams.  Please re-consult if needed. Thanks, Maudie Flakes, MSN, RN, [place], [place], [place] [phone])

## 2014-11-28 NOTE — Progress Notes (Signed)
Progress Note   Vanessa Mason:[id number] DOB: 04-07-1918 DOA: 11/27/2014 PCP: Garret Reddish, MD   Brief Narrative:   Vanessa Mason is an 81 y.o. 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  Assessment/Plan:   Principal Problem:   Cellulitis and abscess of leg / surgical wound infection / dehiscence of external surgical wound - Continue empiric vancomycin. - Follow-up blood and wound cultures. - Wound care consultation pending.  Active Problems:   Hypertension - Continue losartan.    Depression - Continue Celexa.    DVT Prophylaxis - Lovenox ordered.  Family Communication: No family at the bedside. Disposition Plan: Home when stable. Code Status:     Code Status Orders        Start     Ordered   11/27/14 2329  Do not attempt resuscitation (DNR)   Continuous    Question Answer Comment  In the event of cardiac or respiratory ARREST Do not call a "code blue"   In the event of cardiac or respiratory ARREST Do not perform Intubation, CPR, defibrillation or ACLS   In the event of cardiac or respiratory ARREST Use medication by any route, position, wound care, and other measures to relive pain and suffering. May use oxygen, suction and manual treatment of airway obstruction as needed for comfort.      11/27/14 2329    Advance Directive Documentation        Most Recent Value   Type of Advance Directive  Healthcare Power of Attorney, Living will   Pre-existing out of facility DNR order (yellow form or pink MOST form)     "MOST" Form in Place?          IV Access:    Peripheral IV   Procedures and diagnostic studies:   No results found.   Medical Consultants:    None.  Anti-Infectives:    Vancomycin 11/27/14--->  Subjective:   Vanessa Mason reports pain RLE.  No other complaints.  No dyspnea, nausea.  Appetite  OK.  Last BM was last pm.  Objective:    Filed Vitals:   11/27/14 2026 11/27/14 2338 11/28/14 0005 11/28/14 0631  BP: 121/68 125/57 133/56 143/60  Pulse: 97 89 82 90  Temp:   98.7 F (37.1 C) 99.1 F (37.3 C)  TempSrc:   Oral Oral  Resp: 17 18 16 16   Height:   4' 11.5" (1.511 m)   Weight:   58.1 kg (128 lb 1.4 oz)   SpO2: 99% 98% 92% 91%    Intake/Output Summary (Last 24 hours) at 11/28/14 0724 Last data filed at 11/28/14 5456  Gross per 24 hour  Intake      0 ml  Output    400 ml  Net   -400 ml    Exam: Gen:  NAD Cardiovascular:  RRR, No M/R/G Respiratory:  Lungs CTAB Gastrointestinal:  Abdomen soft, NT/ND, + BS Extremities:  RLE shin with 2 incisions with sutures and wound dehiscence.  Intense erythema extends entire shin with some erythema extending to foot.   Data Reviewed:    Labs: Basic Metabolic Panel:  Recent Labs Lab 11/27/14 1906  NA 135  K 4.6  CL 97*  CO2 28  GLUCOSE 111*  BUN 23*  CREATININE 0.75  CALCIUM 8.9   GFR Estimated Creatinine Clearance: 33.1 mL/min (by C-G formula based on Cr of 0.75).  CBC:  Recent Labs Lab 11/27/14 1906  WBC 12.3*  NEUTROABS 9.5*  HGB 12.4  HCT 38.5  MCV 92.5  PLT 222   Microbiology No results found for this or any previous visit (from the past 240 hour(s)).   Medications:   . acidophilus   Oral BID  . calcium carbonate  1 tablet Oral Q breakfast   And  . magnesium oxide  400 mg Oral Q breakfast  . cholecalciferol  2,000 Units Oral Daily  . citalopram  10 mg Oral Daily  . cyclobenzaprine  5 mg Oral QHS  . heparin  5,000 Units Subcutaneous 3 times per day  . losartan  50 mg Oral Daily  . multivitamin  1 tablet Oral Daily  . multivitamin with minerals  1 tablet Oral BID  . omega-3 acid ethyl esters  1 g Oral Daily  . vancomycin  500 mg Intravenous Q24H   Continuous Infusions:   Time spent: 25 minutes.   LOS: 1 day   RAMA,CHRISTINA  [facility] Pager [phone]. If unable to  reach me by pager, please call my cell phone at [phone].  *Please refer to [email or web address], password [email or web address] to get updated schedule on who will round on this patient, as hospitalists switch teams weekly. If 7PM-7AM, please contact night-coverage at [email or web address], password [email or web address] for any overnight needs.  11/28/2014, 7:24 AM

## 2014-11-28 NOTE — Significant Event (Signed)
Spoke with Family member Dr. Riki Altes who is an ID physician and requested coverage with ancef until vanc trough comes back (since we wont know vanc is theraputic until then).    Wound culture at outside physician office growing out staph aureus (sensitivities pending).  Gram stain is G[facility] here.  Spoke with Dr. Rockne Menghini over phone, who okayed putting patient on ancef.  Will order ancef.

## 2014-11-28 NOTE — Progress Notes (Signed)
Protocol: Zosyn Indication: cellulitis and abscess of leg/surgical wound infection  Assessment:  89yoF who is known to pharmacy due to Vancomycin therapy. Zosyn to be added today for abscess coverage.  Plan: Will give Zosyn 3.375 Gm IV Q8h over 4 hour infusion (CLcr=33 ml/min). Will f/u Scr and adjust dose as needed. Will f/u Blood and Wound Cx.  Thanks, Garnet Sierras, PharmD. 11/28/2014

## 2014-11-29 DIAGNOSIS — T814XXD Infection following a procedure, subsequent encounter: Secondary | ICD-10-CM

## 2014-11-29 LAB — BASIC METABOLIC PANEL
Anion gap: 7 (ref 5–15)
BUN: 16 mg/dL (ref 6–20)
CO2: 31 mmol/L (ref 22–32)
Calcium: 8.4 mg/dL — ABNORMAL LOW (ref 8.9–10.3)
Chloride: 98 mmol/L — ABNORMAL LOW (ref 101–111)
Creatinine, Ser: 0.64 mg/dL (ref 0.44–1.00)
GFR calc Af Amer: >60 mL/min (ref >60)
GFR calc non Af Amer: >60 mL/min (ref >60)
Glucose, Bld: 96 mg/dL (ref 65–99)
Potassium: 4.1 mmol/L (ref 3.5–5.1)
Sodium: 136 mmol/L (ref 135–145)

## 2014-11-29 LAB — VANCOMYCIN, RANDOM: Vancomycin Rm: 8 ug/mL

## 2014-11-29 LAB — SEDIMENTATION RATE: Sed Rate: 53 mm/hr — ABNORMAL HIGH (ref 0–22)

## 2014-11-29 LAB — C-REACTIVE PROTEIN: CRP: 11.2 mg/dL — ABNORMAL HIGH (ref <1.0)

## 2014-11-29 LAB — MRSA PCR SCREENING: MRSA by PCR: NEGATIVE

## 2014-11-29 MED ORDER — VANCOMYCIN HCL 500 MG IV SOLR
500.0000 mg | Freq: Two times a day (BID) | INTRAVENOUS | Status: AC
  Administered 2014-11-29 (×2): 500 mg via INTRAVENOUS
  Filled 2014-11-29 (×2): qty 500

## 2014-11-29 MED ORDER — COLLAGENASE 250 UNIT/GM EX OINT
TOPICAL_OINTMENT | Freq: Two times a day (BID) | CUTANEOUS | Status: DC
  Administered 2014-11-30 – 2014-12-02 (×5): via TOPICAL
  Filled 2014-11-29: qty 30

## 2014-11-29 MED ORDER — LIP MEDEX EX OINT
TOPICAL_OINTMENT | CUTANEOUS | Status: AC
  Administered 2014-11-29: 22:00:00
  Filled 2014-11-29: qty 7

## 2014-11-29 NOTE — Consult Note (Addendum)
ORTHOPAEDIC CONSULTATION  REQUESTING PHYSICIAN: Vanessa Maxon Rama, MD  Chief Complaint: Right leg wounds  HPI: Vanessa Mason is a 81 y.o. female who presents with 2 lower leg wounds s/p Mohs surgery last week.  Has had dehiscence of the wound that's not responded to keflex.  She was admitted yesterday for IV abx.  Currently on vanc.  Per daughter in law, the cellulitis has improved.  Wound cultures are growing staph.  Denies f/c/n/v.    Past Medical History  Diagnosis Date  . BACK PAIN 03/08/2007  . DEPRESSIVE DISORDER 12/01/2008  . HYPERLIPIDEMIA 03/09/2006  . HYPERTENSION 03/09/2006  . KNEE PAIN 05/06/2009  . GI bleed 09/12/2010    Naproxen induced Endoscopy with dr Vanessa Mason   . Urinary urgency   . HIATAL HERNIA 12/17/2006  . Arthritis   . Hypothyroidism    Past Surgical History  Procedure Laterality Date  . Abdominal hysterectomy    . Oophorectomy    . Thyroidectomy    . Rotator cuff repair    . Laminectomy      with fusion  . Cardiac catheterization    . Arthroscopic knee Right   . Hernia repair    . Tubal ligation    . Resection distal clavical    . Back surgery      spinal fusion   History   Social History  . Marital Status: Widowed    Spouse Name: N/A  . Number of Children: N/A  . Years of Education: N/A   Social History Main Topics  . Smoking status: Never Smoker   . Smokeless tobacco: Not on file  . Alcohol Use: Not on file     Comment: wine occasionally @ dinner  . Drug Use: No  . Sexual Activity: Not on file   Other Topics Concern  . None   Social History Narrative   Family History  Problem Relation Age of Onset  . Cancer Mother 58  . Cancer Brother    Allergies  Allergen Reactions  . Naproxen     Gi bleed  . Nsaids   . Ace Inhibitors     Cough   . Aspirin     GI bleed   Prior to Admission medications   Medication Sig Start Date End Date Taking? Authorizing Provider  acetaminophen (TYLENOL) 650 MG CR tablet Take 650 mg by  mouth as needed (for pain).    Yes Historical Provider, MD  Calcium-Magnesium 300-300 MG TABS Take 2 tablets by mouth daily.   Yes Historical Provider, MD  cephALEXin (KEFLEX) 500 MG capsule Take 500 mg by mouth every 6 (six) hours. 11/26/14  Yes Historical Provider, MD  Cholecalciferol (VITAMIN D) 2000 UNITS CAPS Take 2,000 Units by mouth daily.    Yes Historical Provider, MD  citalopram (CELEXA) 10 MG tablet Take 1 tablet (10 mg total) by mouth daily. 07/30/14  Yes Vanessa Olp, MD  cyclobenzaprine (FLEXERIL) 10 MG tablet TAKE ONE-HALF TABLET BY MOUTH AT BEDTIME Patient taking differently: Take 5 mg by mouth at bedtime.  02/25/14  Yes Vanessa Olp, MD  HYDROcodone-acetaminophen (NORCO/VICODIN) 5-325 MG per tablet Take 1 tablet by mouth every 4 (four) hours as needed for moderate pain or severe pain.  11/24/14  Yes Historical Provider, MD  KRILL OIL 1000 MG CAPS Take 1,000 mg by mouth daily.    Yes Historical Provider, MD  losartan (COZAAR) 50 MG tablet Take 1 tablet (50 mg total) by mouth daily. 12/16/13  Yes Vanessa H  Swords, MD  Lutein 20 MG CAPS Take 20 mg by mouth daily.    Yes Historical Provider, MD  Multiple Vitamin (MULTIVITAMIN) tablet Take 1 tablet by mouth 2 (two) times daily.    Yes Historical Provider, MD  mupirocin ointment (BACTROBAN) 2 % Apply 1 application topically 2 (two) times daily as needed (skin infection).  11/12/14  Yes Historical Provider, MD  PHOSPHATIDYLSERINE PO Take 1,000 mg by mouth 2 (two) times daily.   Yes Historical Provider, MD  Probiotic Product (PROBIOTIC DAILY PO) Take 1 tablet by mouth 2 (two) times daily.    Yes Historical Provider, MD  triamcinolone cream (KENALOG) 0.1 % Apply 1 application topically daily as needed (skin irritation).  11/23/14  Yes Historical Provider, MD  traMADol (ULTRAM) 50 MG tablet Take 50 mg by mouth 2 (two) times daily as needed for moderate pain.  11/24/14   Historical Provider, MD   No results found.  Positive ROS: All other  systems have been reviewed and were otherwise negative with the exception of those mentioned in the HPI and as above.  Physical Exam: General: Alert, no acute distress Cardiovascular: No pedal edema Respiratory: No cyanosis, no use of accessory musculature GI: No organomegaly, abdomen is soft and non-tender Skin: 2 superficial wounds with dehiscence Neurologic: Sensation intact distally Psychiatric: Patient is competent for consent with normal mood and affect Lymphatic: No axillary or cervical lymphadenopathy  MUSCULOSKELETAL:  - cellulitis, erythema of RLE - tender to palpation - fibrinous exudate in wound, no frank pus, no purulence - no exposed bone - stable eschar in distal wound  Assessment: RLE wounds, superficial dehiscence, eschar  Plan: - will treat with hydrotherapy, santyl for enzymatic debridement BID - continue vanc - borders of cellulitis drawn, will follow - nontoxic - could consider plastic surgery consult  Thank you for the consult and the opportunity to see [name] Vanessa Roux, MD [place] 3:48 PM

## 2014-11-29 NOTE — Plan of Care (Deleted)
Problem: Discharge Progression Outcomes Goal: Barriers To Progression Addressed/Resolved Outcome: Completed/Met Date Met:  11/29/14 Home health nurse will go to pt's home after the pt calls her tonight. HH will start her feedings. Pt given prescriptions. Cousin in to pick up pt and bring her home. Discharge instructions explained to pt and cousin who is an Therapist, sports. Supplies given for pt to take home for dressing changes.    Written on wrong pt. C Millar

## 2014-11-29 NOTE — Plan of Care (Deleted)
Problem: Discharge Progression Outcomes Goal: [place] arrangements in place Outcome: Completed/Met Date Met:  11/29/14 Home health nurse to go to pt's home tomight.    Documented on wrong pt. Hali Marry RN

## 2014-11-29 NOTE — Progress Notes (Addendum)
ANTIBIOTIC CONSULT NOTE - FOLLOW UP  Pharmacy Consult for Vancomycin Indication: cellulitis  Allergies  Allergen Reactions  . Naproxen     Gi bleed  . Nsaids   . Ace Inhibitors     Cough   . Aspirin     GI bleed    Patient Measurements: Height: 4' 11.5" (151.1 cm) Weight: 128 lb 1.4 oz (58.1 kg) IBW/kg (Calculated) : 44.35   Vital Signs: Temp: 97.7 F (36.5 C) (07/03 0530) Temp Source: Oral (07/03 0530) BP: 110/88 mmHg (07/03 0530) Pulse Rate: 81 (07/03 0530) Intake/Output from previous day: 07/02 0701 - 07/03 0700 In: 530 [P.O.:480; IV Piggyback:50] Out: 1425 [Urine:1425] Intake/Output from this shift:    Labs:  Recent Labs  11/27/14 1906  WBC 12.3*  HGB 12.4  PLT 222  CREATININE 0.75   Estimated Creatinine Clearance: 33.1 mL/min (by C-G formula based on Cr of 0.75).  Recent Labs  11/29/14 0617  [facility] 8     Microbiology: Recent Results (from the past 720 hour(s))  Surgical pcr screen     Status: Abnormal   Collection Time: 11/12/14 11:49 AM  Result Value Ref Range Status   MRSA, PCR NEGATIVE NEGATIVE Final   Staphylococcus aureus POSITIVE (A) NEGATIVE Final    Comment:        The Xpert SA Assay (FDA approved for NASAL specimens in patients over 67 years of age), is one component of a comprehensive surveillance program.  Test performance has been validated by [facility] for patients greater than or equal to 22 year old. It is not intended to diagnose infection nor to guide or monitor treatment.   Urine culture     Status: None   Collection Time: 11/12/14 11:50 AM  Result Value Ref Range Status   Specimen Description URINE, CLEAN CATCH  Final   Special Requests NONE  Final   Culture >100,000 CFU VIRIDANS STREPTOCOCCUS  Final   Report Status 11/13/2014 FINAL  Final  Culture, blood (routine x 2)     Status: None (Preliminary result)   Collection Time: 11/27/14  7:15 PM  Result Value Ref Range Status   Specimen Description BLOOD  LAC  Final   Special Requests BOTTLES DRAWN AEROBIC AND ANAEROBIC 5CC  Final   Culture   Final    NO GROWTH < 24 HOURS Performed at [facility]    Report Status PENDING  Incomplete  Culture, blood (routine x 2)     Status: None (Preliminary result)   Collection Time: 11/27/14  7:23 PM  Result Value Ref Range Status   Specimen Description BLOOD LFA  Final   Special Requests BOTTLES DRAWN AEROBIC ONLY 5CC  Final   Culture   Final    NO GROWTH < 24 HOURS Performed at [facility]    Report Status PENDING  Incomplete  Stat Gram stain     Status: None   Collection Time: 11/28/14 12:46 PM  Result Value Ref Range Status   Specimen Description WOUND  Final   Special Requests Normal  Final   Gram Stain   Final    FEW GRAM POSITIVE COCCI WBC PRESENT, PREDOMINANTLY PMN Gram Stain Report Called to,Read Back By and Verified With: Monica at 1559 on 11/28/14 by S.VanHoorne    Report Status 11/28/2014 FINAL  Final    Anti-infectives    Start     Dose/Rate Route Frequency Ordered Stop   11/28/14 2200  ceFAZolin (ANCEF) IVPB 1 g/50 mL premix  1 g 100 mL/hr over 30 Minutes Intravenous 3 times per day 11/28/14 1954     11/28/14 1800  vancomycin (VANCOCIN) 500 mg in sodium chloride 0.9 % 100 mL IVPB     500 mg 100 mL/hr over 60 Minutes Intravenous Every 24 hours 11/27/14 2028     11/28/14 1330  piperacillin-tazobactam (ZOSYN) IVPB 3.375 g  Status:  Discontinued     3.375 g 12.5 mL/hr over 240 Minutes Intravenous 3 times per day 11/28/14 1309 11/28/14 1818   11/27/14 2100  vancomycin (VANCOCIN) IVPB 1000 mg/200 mL premix     1,000 mg 200 mL/hr over 60 Minutes Intravenous STAT 11/27/14 2028 11/27/14 2149   11/27/14 0000  sulfamethoxazole-trimethoprim (BACTRIM DS,SEPTRA DS) 800-160 MG per tablet  Status:  Discontinued     1 tablet Oral 2 times daily 11/27/14 2238 11/27/14       Assessment: 81yoF with PMH of 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 Pt was given Vanco 1gm on 7/1 @2049  and 500mg  on 7/2@1820 . Although it is not on steady state yet, a family member requested to check Vanco level sooner than later. Vanco random level=8 mcg/ml 12 hours after the 500mg . Although this is not at steady state and since our Vanco trough goal is 10-15 mcg/ml, will go ahead and change regimen to 500mg  IV Q12h x 2 doses for now (as expect accumulation) and check Vanco trough level prior to 9 am dose tomorrow (this will be at steady state prior to overall 4th dose). Based on this level, we will put patient on maintenance dose.  Goal of Therapy:  Vancomycin trough level 10-15 mcg/ml  Plan:  Change Vancomycin to 500mg  IV Q12h x 2 doses for now. Check Vanco trough level prior to 9am dose tomorrow. Will then order Vancomycin maintenance dose based on this level. Will check Scr with Vanco level tomorrow. Pt on concurrent Ancef 1gm IV Q8h. Will f/u Blood and Wound Cx and deescalate prn.    Garnet Sierras, PharmD. 11/29/2014,8:14 AM

## 2014-11-29 NOTE — Progress Notes (Addendum)
Progress Note   Vanessa Mason LHT:[id number] DOB: 1918-05-23 DOA: 11/27/2014 PCP: Garret Reddish, MD   Brief Narrative:   Vanessa Mason is an 81 y.o. 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  Assessment/Plan:   Principal Problem:   Cellulitis and abscess of leg / surgical wound infection / dehiscence of external surgical wound - Continue empiric vancomycin. Ancef added for double coverage. Gram stain negative for gram-negative rods. - Follow-up blood and wound cultures. Gram stain shows GPC - Requested orthopedic consultation for possible debridement given necrotic area. - Check ESR/CRP, may need CT scan to R/O underlying osteo.  Active Problems:   Hypertension - Continue losartan.    Depression - Continue Celexa.    DVT Prophylaxis - Lovenox ordered.  Family Communication: Son updated at the bedside. Disposition Plan: Home when stable. Code Status:     Code Status Orders        Start     Ordered   11/27/14 2329  Do not attempt resuscitation (DNR)   Continuous    Question Answer Comment  In the event of cardiac or respiratory ARREST Do not call a "code blue"   In the event of cardiac or respiratory ARREST Do not perform Intubation, CPR, defibrillation or ACLS   In the event of cardiac or respiratory ARREST Use medication by any route, position, wound care, and other measures to relive pain and suffering. May use oxygen, suction and manual treatment of airway obstruction as needed for comfort.      11/27/14 2329    Advance Directive Documentation        Most Recent Value   Type of Advance Directive  Healthcare Power of Attorney, Living will   Pre-existing out of facility DNR order (yellow form or pink MOST form)     "MOST" Form in Place?          IV Access:    Peripheral IV   Procedures and diagnostic studies:   No  results found.   Medical Consultants:    Orthopedic surgery  Anti-Infectives:    Vancomycin 11/27/14--->  Ancef 11/27/14--->  Subjective:   Vanessa Mason reports ongoing pain RLE. No dyspnea, nausea.  Appetite diminished. No diarrhea.  Objective:    Filed Vitals:   11/28/14 0928 11/28/14 1418 11/28/14 2106 11/29/14 0530  BP: 111/50 123/66 132/65 110/88  Pulse: 88 88 82 81  Temp:  98.5 F (36.9 C) 98.3 F (36.8 C) 97.7 F (36.5 C)  TempSrc:  Oral Oral Oral  Resp:  _0 Height:      Weight:      SpO2:  95% 96% 92%    Intake/Output Summary (Last 24 hours) at 11/29/14 0749 Last data filed at 11/29/14 5726  Gross per 24 hour  Intake    530 ml  Output   1425 ml  Net   -895 ml    Exam: Gen:  NAD Cardiovascular:  RRR, No M/R/G Respiratory:  Lungs CTAB Gastrointestinal:  Abdomen soft, NT/ND, + BS Extremities:  RLE shin with 2 incisions with sutures and wound dehiscence & purulent drainage.  Intense erythema extends entire shin with some erythema extending to foot. Necrotic patch in lower wound. Pictures documented 11/28/14.     Data Reviewed:    Labs: Basic Metabolic Panel:  Recent Labs Lab 11/27/14 1906  NA 135  K 4.6  CL  97*  CO2 28  GLUCOSE 111*  BUN 23*  CREATININE 0.75  CALCIUM 8.9   GFR Estimated Creatinine Clearance: 33.1 mL/min (by C-G formula based on Cr of 0.75).  CBC:  Recent Labs Lab 11/27/14 1906  WBC 12.3*  NEUTROABS 9.5*  HGB 12.4  HCT 38.5  MCV 92.5  PLT 222   Microbiology Recent Results (from the past 240 hour(s))  Culture, blood (routine x 2)     Status: None (Preliminary result)   Collection Time: 11/27/14  7:15 PM  Result Value Ref Range Status   Specimen Description BLOOD LAC  Final   Special Requests BOTTLES DRAWN AEROBIC AND ANAEROBIC 5CC  Final   Culture   Final    NO GROWTH < 24 HOURS Performed at [facility]    Report Status PENDING  Incomplete  Culture, blood (routine x 2)     Status:  None (Preliminary result)   Collection Time: 11/27/14  7:23 PM  Result Value Ref Range Status   Specimen Description BLOOD LFA  Final   Special Requests BOTTLES DRAWN AEROBIC ONLY 5CC  Final   Culture   Final    NO GROWTH < 24 HOURS Performed at [facility]    Report Status PENDING  Incomplete  Stat Gram stain     Status: None   Collection Time: 11/28/14 12:46 PM  Result Value Ref Range Status   Specimen Description WOUND  Final   Special Requests Normal  Final   Gram Stain   Final    FEW GRAM POSITIVE COCCI WBC PRESENT, PREDOMINANTLY PMN Gram Stain Report Called to,Read Back By and Verified With: Monica at 1559 on 11/28/14 by S.VanHoorne    Report Status 11/28/2014 FINAL  Final     Medications:   . acidophilus   Oral BID  . calcium carbonate  1 tablet Oral Q breakfast   And  . magnesium oxide  400 mg Oral Q breakfast  .  ceFAZolin (ANCEF) IV  1 g Intravenous 3 times per day  . cholecalciferol  2,000 Units Oral Daily  . citalopram  10 mg Oral Daily  . cyclobenzaprine  5 mg Oral QHS  . enoxaparin (LOVENOX) injection  40 mg Subcutaneous Q24H  . losartan  50 mg Oral Daily  . multivitamin  1 tablet Oral Daily  . multivitamin with minerals  1 tablet Oral BID  . omega-3 acid ethyl esters  1 g Oral Daily  . vancomycin  500 mg Intravenous Q24H   Continuous Infusions:   Time spent: 25 minutes.   LOS: 2 days   RAMA,CHRISTINA  [facility] Pager [phone]. If unable to reach me by pager, please call my cell phone at [phone].  *Please refer to [email or web address], password [email or web address] to get updated schedule on who will round on this patient, as hospitalists switch teams weekly. If 7PM-7AM, please contact night-coverage at [email or web address], password [email or web address] for any overnight needs.  11/29/2014, 7:49 AM

## 2014-11-30 LAB — BASIC METABOLIC PANEL
Anion gap: 6 (ref 5–15)
BUN: 20 mg/dL (ref 6–20)
CO2: 33 mmol/L — ABNORMAL HIGH (ref 22–32)
Calcium: 8.9 mg/dL (ref 8.9–10.3)
Chloride: 98 mmol/L — ABNORMAL LOW (ref 101–111)
Creatinine, Ser: 0.69 mg/dL (ref 0.44–1.00)
GFR calc Af Amer: >60 mL/min (ref >60)
GFR calc non Af Amer: >60 mL/min (ref >60)
Glucose, Bld: 97 mg/dL (ref 65–99)
Potassium: 4.1 mmol/L (ref 3.5–5.1)
Sodium: 137 mmol/L (ref 135–145)

## 2014-11-30 LAB — WOUND CULTURE: Gram Stain: NONE SEEN

## 2014-11-30 LAB — CREATININE, SERUM
Creatinine, Ser: 0.63 mg/dL (ref 0.44–1.00)
GFR calc Af Amer: >60 mL/min (ref >60)
GFR calc non Af Amer: >60 mL/min (ref >60)

## 2014-11-30 LAB — VANCOMYCIN, TROUGH: Vancomycin Tr: 10 ug/mL (ref 10.0–20.0)

## 2014-11-30 MED ORDER — VANCOMYCIN HCL 500 MG IV SOLR
500.0000 mg | Freq: Two times a day (BID) | INTRAVENOUS | Status: DC
  Administered 2014-11-30: 500 mg via INTRAVENOUS
  Filled 2014-11-30 (×2): qty 500

## 2014-11-30 MED ORDER — CEFAZOLIN SODIUM 1-5 GM-% IV SOLN
1.0000 g | Freq: Three times a day (TID) | INTRAVENOUS | Status: DC
  Administered 2014-11-30 – 2014-12-02 (×7): 1 g via INTRAVENOUS
  Filled 2014-11-30 (×8): qty 50

## 2014-11-30 NOTE — Progress Notes (Signed)
Cellulitis is stable, no worse. Santyl ointment started yesterday. Fibrinous slough is softer appearing Eschar stable Continue IV abx and santyl ointment BID Hydrotherapy ordered also Will continue to follow the wound  N. Eduard Roux, MD [place] 7:26 AM

## 2014-11-30 NOTE — Plan of Care (Signed)
Problem: Phase II Progression Outcomes Goal: Progress activity as tolerated unless otherwise ordered Outcome: Progressing Started PT today. Ambulating with front wheel walker

## 2014-11-30 NOTE — Progress Notes (Signed)
ANTIBIOTIC CONSULT NOTE - FOLLOW UP  Pharmacy Consult for Ancef Indication: cellulitis  Allergies  Allergen Reactions  . Naproxen     Gi bleed  . Nsaids   . Ace Inhibitors     Cough   . Aspirin     GI bleed    Patient Measurements: Height: 4' 11.5" (151.1 cm) Weight: 128 lb 1.4 oz (58.1 kg) IBW/kg (Calculated) : 44.35   Vital Signs: Temp: 98.1 F (36.7 C) (07/04 1434) Temp Source: Oral (07/04 1434) BP: 153/61 mmHg (07/04 1434) Pulse Rate: 81 (07/04 1434) Intake/Output from previous day: 07/03 0701 - 07/04 0700 In: 990 [P.O.:640; IV Piggyback:350] Out: 1540 [Urine:1540] Intake/Output from this shift: Total I/O In: 560 [P.O.:460; IV Piggyback:100] Out: 400 [Urine:400]  Labs:  Recent Labs  11/27/14 1906 11/29/14 0617 11/30/14 0410 11/30/14 0754  WBC 12.3*  --   --   --   HGB 12.4  --   --   --   PLT 222  --   --   --   CREATININE 0.75 0.64 0.69 0.63   Estimated Creatinine Clearance: 33.1 mL/min (by C-G formula based on Cr of 0.63).  Recent Labs  11/29/14 0617 11/30/14 0754  VANCOTROUGH  --  10  VANCORANDOM 8  --      Microbiology: Recent Results (from the past 720 hour(s))  Surgical pcr screen     Status: Abnormal   Collection Time: 11/12/14 11:49 AM  Result Value Ref Range Status   MRSA, PCR NEGATIVE NEGATIVE Final   Staphylococcus aureus POSITIVE (A) NEGATIVE Final    Comment:        The Xpert SA Assay (FDA approved for NASAL specimens in patients over 52 years of age), is one component of a comprehensive surveillance program.  Test performance has been validated by [facility] for patients greater than or equal to 28 year old. It is not intended to diagnose infection nor to guide or monitor treatment.   Urine culture     Status: None   Collection Time: 11/12/14 11:50 AM  Result Value Ref Range Status   Specimen Description URINE, CLEAN CATCH  Final   Special Requests NONE  Final   Culture >100,000 CFU VIRIDANS STREPTOCOCCUS  Final    Report Status 11/13/2014 FINAL  Final  Culture, blood (routine x 2)     Status: None (Preliminary result)   Collection Time: 11/27/14  7:15 PM  Result Value Ref Range Status   Specimen Description BLOOD LAC  Final   Special Requests BOTTLES DRAWN AEROBIC AND ANAEROBIC 5CC  Final   Culture   Final    NO GROWTH 2 DAYS Performed at [facility]    Report Status PENDING  Incomplete  Culture, blood (routine x 2)     Status: None (Preliminary result)   Collection Time: 11/27/14  7:23 PM  Result Value Ref Range Status   Specimen Description BLOOD LFA  Final   Special Requests BOTTLES DRAWN AEROBIC ONLY 5CC  Final   Culture   Final    NO GROWTH 2 DAYS Performed at [facility]    Report Status PENDING  Incomplete  Wound culture     Status: None   Collection Time: 11/27/14 11:43 PM  Result Value Ref Range Status   Specimen Description WOUND RIGHT LEG  Final   Special Requests NONE  Final   Gram Stain   Final    NO WBC SEEN NO SQUAMOUS EPITHELIAL CELLS SEEN RARE GRAM POSITIVE COCCI  IN PAIRS IN CLUSTERS Performed at [facility]    Culture   Final    MODERATE STAPHYLOCOCCUS AUREUS Note: RIFAMPIN AND GENTAMICIN SHOULD NOT BE USED AS SINGLE DRUGS FOR TREATMENT OF STAPH INFECTIONS. This organism is presumed to be Clindamycin resistant based on detection of inducible Clindamycin resistance. Performed at [facility]    Report Status 11/30/2014 FINAL  Final   Organism ID, Bacteria STAPHYLOCOCCUS AUREUS  Final      Susceptibility   Staphylococcus aureus - MIC*    CLINDAMYCIN RESISTANT      ERYTHROMYCIN RESISTANT      GENTAMICIN <=0.5 SENSITIVE Sensitive     LEVOFLOXACIN <=0.12 SENSITIVE Sensitive     OXACILLIN <=0.25 SENSITIVE Sensitive     PENICILLIN >=0.5 RESISTANT Resistant     RIFAMPIN <=0.5 SENSITIVE Sensitive     TRIMETH/SULFA <=10 SENSITIVE Sensitive     VANCOMYCIN 1 SENSITIVE Sensitive     TETRACYCLINE <=1 SENSITIVE Sensitive      MOXIFLOXACIN <=0.25 SENSITIVE Sensitive     * MODERATE STAPHYLOCOCCUS AUREUS  Stat Gram stain     Status: None   Collection Time: 11/28/14 12:46 PM  Result Value Ref Range Status   Specimen Description WOUND  Final   Special Requests Normal  Final   Gram Stain   Final    FEW GRAM POSITIVE COCCI WBC PRESENT, PREDOMINANTLY PMN Gram Stain Report Called to,Read Back By and Verified With: Monica at 1559 on 11/28/14 by S.VanHoorne    Report Status 11/28/2014 FINAL  Final  MRSA PCR Screening     Status: None   Collection Time: 11/29/14  5:54 PM  Result Value Ref Range Status   MRSA by PCR NEGATIVE NEGATIVE Final    Comment:        The GeneXpert MRSA Assay (FDA approved for NASAL specimens only), is one component of a comprehensive MRSA colonization surveillance program. It is not intended to diagnose MRSA infection nor to guide or monitor treatment for MRSA infections.     Anti-infectives    Start     Dose/Rate Route Frequency Ordered Stop   11/30/14 0900  vancomycin (VANCOCIN) 500 mg in sodium chloride 0.9 % 100 mL IVPB  Status:  Discontinued     500 mg 100 mL/hr over 60 Minutes Intravenous Every 12 hours 11/30/14 0843 11/30/14 1452   11/29/14 0900  vancomycin (VANCOCIN) 500 mg in sodium chloride 0.9 % 100 mL IVPB     500 mg 100 mL/hr over 60 Minutes Intravenous Every 12 hours 11/29/14 0840 11/29/14 2145   11/28/14 2200  ceFAZolin (ANCEF) IVPB 1 g/50 mL premix  Status:  Discontinued     1 g 100 mL/hr over 30 Minutes Intravenous 3 times per day 11/28/14 1954 11/30/14 1255   11/28/14 1800  vancomycin (VANCOCIN) 500 mg in sodium chloride 0.9 % 100 mL IVPB  Status:  Discontinued     500 mg 100 mL/hr over 60 Minutes Intravenous Every 24 hours 11/27/14 2028 11/29/14 0839   11/28/14 1330  piperacillin-tazobactam (ZOSYN) IVPB 3.375 g  Status:  Discontinued     3.375 g 12.5 mL/hr over 240 Minutes Intravenous 3 times per day 11/28/14 1309 11/28/14 1818   11/27/14 2100  vancomycin  (VANCOCIN) IVPB 1000 mg/200 mL premix     1,000 mg 200 mL/hr over 60 Minutes Intravenous STAT 11/27/14 2028 11/27/14 2149   11/27/14 0000  sulfamethoxazole-trimethoprim (BACTRIM DS,SEPTRA DS) 800-160 MG per tablet  Status:  Discontinued     1 tablet  Oral 2 times daily 11/27/14 2238 11/27/14       Assessment: Vanessa Mason with PMH of 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 Would is growing MSSA so vancomycin is now dc'd and Pharmacy is consulted to continue dosing Ancef.  7/1 >> vancomycin >>  7/2 >> Zosyn >> 7/2 7/2 >> Ancef >>  Tmax: afebrile WBC: elevated 7/1 Renal: SCr wnl/stable, CrCl 33 m/min  7/1 blood: ngtd 7/2 R leg wound: moderate MSSA  Goal of Therapy:  Eradication of infection Dose appropriate for indication and renal fucntion  Plan:   Ancef 1g IV q8h  Monitor renal function and adjust as needed  Peggyann Juba, PharmD, BCPS Pager: [phone] 11/30/2014,3:05 PM

## 2014-11-30 NOTE — Plan of Care (Signed)
Problem: Phase II Progression Outcomes Goal: Wound without signs/symptoms of infection, decreasing edema Outcome: Progressing Hydrotherapy done today

## 2014-11-30 NOTE — Evaluation (Signed)
Physical Therapy Evaluation Patient Details Name: Vanessa Mason MRN: [id number] DOB: Oct 08, 1917 Today's Date: 11/30/2014   History of Present Illness  81 y.o. 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 status post recent Mohs procedure who was admitted 11/27/14 with postoperative cellulitis  Clinical Impression  Pt admitted with above diagnosis. Pt currently with functional limitations due to the deficits listed below (see PT Problem List).  Pt will benefit from skilled PT to increase their independence and safety with mobility to allow discharge to the venue listed below.  Pt would benefit from increased care upon d/c (from independent section).     Follow Up Recommendations SNF;Supervision for mobility/OOB    Equipment Recommendations  None recommended by PT    Recommendations for Other Services       Precautions / Restrictions Precautions Precautions: Fall      Mobility  Bed Mobility Overal bed mobility: Modified Independent                Transfers Overall transfer level: Needs assistance   Transfers: Sit to/from Stand;Stand Pivot Transfers Sit to Stand: Min guard Stand pivot transfers: Min guard       General transfer comment: close guard for safety  Ambulation/Gait Ambulation/Gait assistance: Min guard Ambulation Distance (Feet): 120 Feet Assistive device: Rolling walker (2 wheeled) Gait Pattern/deviations: Antalgic;Step-to pattern     General Gait Details: pt reports pain with flat R foot contact so educated to use forefoot for pain control  Stairs            Wheelchair Mobility    Modified Rankin (Stroke Patients Only)       Balance                                             Pertinent Vitals/Pain Pain Assessment: 0-10 Pain Score: 5  Pain Location: R lower leg Pain Descriptors / Indicators: Tender;Sore Pain Intervention(s): Limited activity within patient's tolerance;Monitored  during session;Repositioned;Premedicated before session    Home Living   Living Arrangements: Alone   Type of Home: Independent living facility         Home Equipment: Walker - 2 wheels      Prior Function Level of Independence: Independent               Hand Dominance        Extremity/Trunk Assessment               Lower Extremity Assessment: RLE deficits/detail RLE Deficits / Details: pain with DF likely due to exposed anterior tibialis tendon       Communication   Communication: No difficulties  Cognition Arousal/Alertness: Awake/alert Behavior During Therapy: WFL for tasks assessed/performed Overall Cognitive Status: Within Functional Limits for tasks assessed                      General Comments      Exercises        Assessment/Plan    PT Assessment Patient needs continued PT services  PT Diagnosis Difficulty walking;Acute pain   PT Problem List Pain;Decreased knowledge of use of DME;Decreased mobility  PT Treatment Interventions DME instruction;Gait training;Functional mobility training;Patient/family education;Therapeutic exercise;Therapeutic activities   PT Goals (Current goals can be found in the Care Plan section) Acute Rehab PT Goals PT Goal Formulation: With patient Time For Goal Achievement: 12/07/14  Potential to Achieve Goals: Good    Frequency Min 3X/week   Barriers to discharge        Co-evaluation               End of Session   Activity Tolerance: Patient tolerated treatment well Patient left: in bed;with call bell/phone within reach Nurse Communication: Mobility status         Time: 1050-1106 PT Time Calculation (min) (ACUTE ONLY): 16 min   Charges:   PT Evaluation $Initial PT Evaluation Tier I: 1 Procedure     PT G Codes:        LEMYRE,KATHrine E 11/30/2014, 1:25 PM Carmelia Bake, PT, DPT 11/30/2014 Pager: [phone]

## 2014-11-30 NOTE — Progress Notes (Signed)
Progress Note   Vanessa Mason DGL:[id number] DOB: Jan 25, 1918 DOA: 11/27/2014 PCP: Garret Reddish, MD   Brief Narrative:   Vanessa Mason is an 81 y.o. 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  Orthopedics have consulted on the patient and have recommended hydrotherapy and Santyl.  Assessment/Plan:   Principal Problem:   Cellulitis and abscess of leg / surgical wound infection / dehiscence of external surgical wound - Wound culture growing methicillin sensitive staph. - Continue cefazolin. Discontinue vancomycin to narrow coverage given sensitivities. - Follow-up blood cultures. - Seen by ortho 11/29/14, no plans for surgical debridement, ordered hydrotherapy and Santyl. - Family requests plastic surgery consult.  No plastic surgeon on call. - Check ESR/CRP both elevated. ESR reassuring, as it is not in the range typically associated with osteomyelitis. - Family would like a Psychiatric nurse consult. No plastic surgeon on call. Would call [employer] in a.m.  Active Problems:   Hypertension - Continue losartan.    Depression - Continue Celexa.    DVT Prophylaxis - Lovenox ordered.  Family Communication: Son updated at the bedside. Granddaughter (who is an ID physician) was updated by telephone. Please note that she would like daily updates and her contact number is [phone]. Disposition Plan: Home when stable. Code Status:     Code Status Orders        Start     Ordered   11/27/14 2329  Do not attempt resuscitation (DNR)   Continuous    Question Answer Comment  In the event of cardiac or respiratory ARREST Do not call a "code blue"   In the event of cardiac or respiratory ARREST Do not perform Intubation, CPR, defibrillation or ACLS   In the event of cardiac or respiratory ARREST Use medication by any route, position, wound care, and  other measures to relive pain and suffering. May use oxygen, suction and manual treatment of airway obstruction as needed for comfort.      11/27/14 2329    Advance Directive Documentation        Most Recent Value   Type of Advance Directive  Healthcare Power of Attorney, Living will   Pre-existing out of facility DNR order (yellow form or pink MOST form)     "MOST" Form in Place?          IV Access:    Peripheral IV   Procedures and diagnostic studies:   No results found.   Medical Consultants:    Orthopedic surgery  Anti-Infectives:    Vancomycin 11/27/14---> 11/30/14  Ancef 11/27/14--->  Subjective:   Vanessa Mason reports ongoing occasional pain RLE. No dyspnea, nausea or vomiting.  Appetite improving. No diarrhea.  Objective:    Filed Vitals:   11/29/14 0530 11/29/14 1334 11/29/14 2024 11/30/14 0441  BP: 110/88 124/65 130/57 167/58  Pulse: 81 91 86 81  Temp: 97.7 F (36.5 C) 98.4 F (36.9 C) 98.4 F (36.9 C) 98.3 F (36.8 C)  TempSrc: Oral Oral Oral Oral  Resp: '20 18 16 16  ' Height:      Weight:      SpO2: 92% 94% 96% 94%    Intake/Output Summary (Last 24 hours) at 11/30/14 1253 Last data filed at 11/30/14 1138  Gross per 24 hour  Intake   1050 ml  Output   1300 ml  Net   -250 ml  Exam: Gen:  NAD Cardiovascular:  RRR, No M/R/G Respiratory:  Lungs CTAB Gastrointestinal:  Abdomen soft, NT/ND, + BS Extremities:  Dressings not removed today, please see images documented 11/29/14.  Data Reviewed:    Labs: Basic Metabolic Panel:  Recent Labs Lab 11/27/14 1906 11/29/14 0617 11/30/14 0410 11/30/14 0754  NA 135 136 137  --   K 4.6 4.1 4.1  --   CL 97* 98* 98*  --   CO2 28 31 33*  --   GLUCOSE 111* 96 97  --   BUN 23* 16 20  --   CREATININE 0.75 0.64 0.69 0.63  CALCIUM 8.9 8.4* 8.9  --    GFR Estimated Creatinine Clearance: 33.1 mL/min (by C-G formula based on Cr of 0.63).  CBC:  Recent Labs Lab 11/27/14 1906  WBC  12.3*  NEUTROABS 9.5*  HGB 12.4  HCT 38.5  MCV 92.5  PLT 222   Microbiology Recent Results (from the past 240 hour(s))  Culture, blood (routine x 2)     Status: None (Preliminary result)   Collection Time: 11/27/14  7:15 PM  Result Value Ref Range Status   Specimen Description BLOOD LAC  Final   Special Requests BOTTLES DRAWN AEROBIC AND ANAEROBIC 5CC  Final   Culture   Final    NO GROWTH 2 DAYS Performed at [facility]    Report Status PENDING  Incomplete  Culture, blood (routine x 2)     Status: None (Preliminary result)   Collection Time: 11/27/14  7:23 PM  Result Value Ref Range Status   Specimen Description BLOOD LFA  Final   Special Requests BOTTLES DRAWN AEROBIC ONLY 5CC  Final   Culture   Final    NO GROWTH 2 DAYS Performed at [facility]    Report Status PENDING  Incomplete  Wound culture     Status: None   Collection Time: 11/27/14 11:43 PM  Result Value Ref Range Status   Specimen Description WOUND RIGHT LEG  Final   Special Requests NONE  Final   Gram Stain   Final    NO WBC SEEN NO SQUAMOUS EPITHELIAL CELLS SEEN RARE GRAM POSITIVE COCCI IN PAIRS IN CLUSTERS Performed at [facility]    Culture   Final    MODERATE STAPHYLOCOCCUS AUREUS Note: RIFAMPIN AND GENTAMICIN SHOULD NOT BE USED AS SINGLE DRUGS FOR TREATMENT OF STAPH INFECTIONS. This organism is presumed to be Clindamycin resistant based on detection of inducible Clindamycin resistance. Performed at [facility]    Report Status 11/30/2014 FINAL  Final   Organism ID, Bacteria STAPHYLOCOCCUS AUREUS  Final      Susceptibility   Staphylococcus aureus - MIC*    CLINDAMYCIN RESISTANT      ERYTHROMYCIN RESISTANT      GENTAMICIN <=0.5 SENSITIVE Sensitive     LEVOFLOXACIN <=0.12 SENSITIVE Sensitive     OXACILLIN <=0.25 SENSITIVE Sensitive     PENICILLIN >=0.5 RESISTANT Resistant     RIFAMPIN <=0.5 SENSITIVE Sensitive     TRIMETH/SULFA <=10 SENSITIVE Sensitive      VANCOMYCIN 1 SENSITIVE Sensitive     TETRACYCLINE <=1 SENSITIVE Sensitive     MOXIFLOXACIN <=0.25 SENSITIVE Sensitive     * MODERATE STAPHYLOCOCCUS AUREUS  Stat Gram stain     Status: None   Collection Time: 11/28/14 12:46 PM  Result Value Ref Range Status   Specimen Description WOUND  Final   Special Requests Normal  Final   Gram Stain  Final    FEW GRAM POSITIVE COCCI WBC PRESENT, PREDOMINANTLY PMN Gram Stain Report Called to,Read Back By and Verified With: Monica at 1559 on 11/28/14 by S.[place]Hoorne    Report Status 11/28/2014 FINAL  Final  MRSA PCR Screening     Status: None   Collection Time: 11/29/14  5:54 PM  Result Value Ref Range Status   MRSA by PCR NEGATIVE NEGATIVE Final    Comment:        The GeneXpert MRSA Assay (FDA approved for NASAL specimens only), is one component of a comprehensive MRSA colonization surveillance program. It is not intended to diagnose MRSA infection nor to guide or monitor treatment for MRSA infections.      Medications:   . acidophilus   Oral BID  . calcium carbonate  1 tablet Oral Q breakfast   And  . magnesium oxide  400 mg Oral Q breakfast  .  ceFAZolin (ANCEF) IV  1 g Intravenous 3 times per day  . cholecalciferol  2,000 Units Oral Daily  . citalopram  10 mg Oral Daily  . collagenase   Topical BID  . cyclobenzaprine  5 mg Oral QHS  . enoxaparin (LOVENOX) injection  40 mg Subcutaneous Q24H  . losartan  50 mg Oral Daily  . multivitamin  1 tablet Oral Daily  . multivitamin with minerals  1 tablet Oral BID  . omega-3 acid ethyl esters  1 g Oral Daily  . vancomycin  500 mg Intravenous Q12H   Continuous Infusions:   Time spent: 25 minutes.   LOS: 3 days   [facility] Pager [phone]. If unable to reach me by pager, please call my cell phone at [phone].  *Please refer to [email or web address], password [email or web address] to get updated schedule on who will round on this patient, as hospitalists switch teams weekly. If 7PM-7AM,  please contact night-coverage at [email or web address], password [email or web address] for any overnight needs.  11/30/2014, 12:53 PM

## 2014-11-30 NOTE — Progress Notes (Signed)
Physical Therapy Wound Evaluation Patient Details  Name: Vanessa Mason MRN: [id number] Date of Birth: 1918-01-14  Today's Date: 11/30/2014 Time: 9675-9163 Time Calculation (min): 38 min  Subjective  Subjective: 81 y.o. 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 status post recent Mohs procedure who was admitted 11/27/14 with postoperative cellulitis Patient and Family Stated Goals: healing  Pain Score: Pain Score: 0-No pain  Wound Assessment                                                                                                                                                Wound / Incision (Open or Dehisced) 11/30/14 Incision - Dehisced Leg Right;Anterior;Proximal HYDRO proximal dehised wound (Active)  Dressing Type Gauze (Comment);Silver hydrofiber 11/30/2014  1:00 PM  Dressing Changed Changed 11/30/2014  1:00 PM  Dressing Status Old drainage;Intact;Clean 11/30/2014  1:00 PM  Dressing Change Frequency Twice a day 11/30/2014  1:00 PM  Site / Wound Assessment Pink;Red;Yellow 11/30/2014  1:00 PM  % Wound base Red or Granulating 40% 11/30/2014  1:00 PM  % Wound base Yellow 60% 11/30/2014  1:00 PM  Peri-wound Assessment Erythema (non-blanchable) 11/30/2014  1:00 PM  Wound Length (cm) 3 cm 11/30/2014  1:00 PM  Wound Width (cm) 1.5 cm 11/30/2014  1:00 PM  Margins Unattached edges (unapproximated) 11/30/2014  1:00 PM  Closure Sutures 11/30/2014  1:00 PM  Drainage Amount Moderate 11/30/2014  1:00 PM  Drainage Description Serosanguineous 11/30/2014  1:00 PM  Treatment Hydrotherapy (Pulse lavage) 11/30/2014  1:00 PM     Wound / Incision (Open or Dehisced) 11/30/14 Incision - Dehisced Leg Right;Distal;Lower;Anterior HYRDO distal dehised wound (Active)  Dressing Type Gauze (Comment);Silver hydrofiber 11/30/2014  1:00 PM  Dressing Changed Changed 11/30/2014  1:00 PM  Dressing Status Clean;Intact;Old drainage 11/30/2014  1:00 PM  Dressing Change Frequency  Twice a day 11/30/2014  1:00 PM  Site / Wound Assessment Black;Yellow;Pink 11/30/2014  1:00 PM  % Wound base Red or Granulating 10% 11/30/2014  1:00 PM  % Wound base Yellow 70% 11/30/2014  1:00 PM  % Wound base Black 20% 11/30/2014  1:00 PM  Peri-wound Assessment Erythema (non-blanchable) 11/30/2014  1:00 PM  Wound Length (cm) 5.5 cm 11/30/2014  1:00 PM  Wound Width (cm) 4 cm 11/30/2014  1:00 PM  Margins Unattached edges (unapproximated) 11/30/2014  1:00 PM  Closure Sutures 11/30/2014  1:00 PM  Drainage Amount Moderate 11/30/2014  1:00 PM  Drainage Description Serosanguineous 11/30/2014  1:00 PM  Treatment Hydrotherapy (Pulse lavage) 11/30/2014  1:00 PM            Hydrotherapy Pulsed lavage therapy - wound location: proximal and distal dehised wound, necrotic black eschar of distal wound Pulsed Lavage with Suction (psi): 8 psi Pulsed Lavage with Suction - Normal Saline Used: 1000 mL Pulsed Lavage Tip: Tip with splash shield  Wound Assessment and Plan  Wound Therapy - Assess/Plan/Recommendations Wound Therapy - Clinical Statement: Pt would benefit from hydrotherapy to assist with cleansing dehised wound sites to promote healing environment Wound Therapy - Functional Problem List: wound culture growing staph Hydrotherapy Plan: Debridement;Patient/family education;Pulsatile lavage with suction Wound Therapy - Frequency: 6X / week Wound Therapy - Follow Up Recommendations: Skilled nursing facility Wound Plan: Continue hydrotherapy to remove nonhealing tissue and promote healing environment  Wound Therapy Goals- Improve the function of patient's integumentary system by progressing the wound(s) through the phases of wound healing (inflammation - proliferation - remodeling) by: Decrease Necrotic Tissue to: 0% Decrease Necrotic Tissue - Progress: Progressing toward goal Increase Granulation Tissue to: 100% Increase Granulation Tissue - Progress: Goal set today Improve Drainage Characteristics: Min Improve  Drainage Characteristics - Progress: Goal set today Goals/treatment plan/discharge plan were made with and agreed upon by patient/family: Yes Time For Goal Achievement: 7 days Wound Therapy - Potential for Goals: Good  Goals will be updated until maximal potential achieved or discharge criteria met.  Discharge criteria: when goals achieved, discharge from hospital, MD decision/surgical intervention, no progress towards goals, refusal/missing three consecutive treatments without notification or medical reason.  GP     LEMYRE,KATHrine E 11/30/2014, 2:02 PM Carmelia Bake, PT, DPT 11/30/2014 Pager: [phone]

## 2014-12-01 DIAGNOSIS — I10 Essential (primary) hypertension: Secondary | ICD-10-CM

## 2014-12-01 DIAGNOSIS — F329 Major depressive disorder, single episode, unspecified: Secondary | ICD-10-CM

## 2014-12-01 DIAGNOSIS — T8131XA Disruption of external operation (surgical) wound, not elsewhere classified, initial encounter: Secondary | ICD-10-CM

## 2014-12-01 DIAGNOSIS — L02419 Cutaneous abscess of limb, unspecified: Secondary | ICD-10-CM

## 2014-12-01 DIAGNOSIS — L03119 Cellulitis of unspecified part of limb: Secondary | ICD-10-CM

## 2014-12-01 LAB — CBC
HCT: 34.2 % — ABNORMAL LOW (ref 36.0–46.0)
Hemoglobin: 11 g/dL — ABNORMAL LOW (ref 12.0–15.0)
MCH: 29.3 pg (ref 26.0–34.0)
MCHC: 32.2 g/dL (ref 30.0–36.0)
MCV: 91.2 fL (ref 78.0–100.0)
Platelets: 306 10*3/uL (ref 150–400)
RBC: 3.75 MIL/uL — ABNORMAL LOW (ref 3.87–5.11)
RDW: 14.3 % (ref 11.5–15.5)
WBC: 7.4 10*3/uL (ref 4.0–10.5)

## 2014-12-01 LAB — BASIC METABOLIC PANEL
Anion gap: 8 (ref 5–15)
BUN: 15 mg/dL (ref 6–20)
CO2: 32 mmol/L (ref 22–32)
Calcium: 9.1 mg/dL (ref 8.9–10.3)
Chloride: 99 mmol/L — ABNORMAL LOW (ref 101–111)
Creatinine, Ser: 0.66 mg/dL (ref 0.44–1.00)
GFR calc Af Amer: >60 mL/min (ref >60)
GFR calc non Af Amer: >60 mL/min (ref >60)
Glucose, Bld: 97 mg/dL (ref 65–99)
Potassium: 3.9 mmol/L (ref 3.5–5.1)
Sodium: 139 mmol/L (ref 135–145)

## 2014-12-01 NOTE — Care Management (Signed)
Important Message  Patient Details  Name: Vanessa Mason MRN: [id number] Date of Birth: 1918/03/16   Medicare Important Message Given:  Yes-second notification given    Camillo Flaming, LCSW 12/01/2014, 12:15 PM

## 2014-12-01 NOTE — Progress Notes (Signed)
Physical Therapy Wound Treatment Patient Details  Name: CALEESI KOHL MRN: [id number] Date of Birth: 29-May-1918  Today's Date: 12/01/2014 Time: 6384-6659 Time Calculation (min): 30 min  Subjective  Subjective: 81 y.o. 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  Distal wound black eschar appears smaller compared to yesterday.  Wound Assessment                                                                                                                                               Wound / Incision (Open or Dehisced) 11/30/14 Incision - Dehisced Leg Right;Anterior;Proximal HYDRO proximal dehised wound (Active)  Dressing Type Gauze (Comment);Silver hydrofiber 12/01/2014  2:00 PM  Dressing Changed Changed 12/01/2014  2:00 PM  Dressing Status Old drainage;Intact 12/01/2014  2:00 PM  Dressing Change Frequency Daily 12/01/2014  2:00 PM  Site / Wound Assessment Red;Yellow 12/01/2014  2:00 PM  % Wound base Red or Granulating 20% 12/01/2014  2:00 PM  % Wound base Yellow 80% 12/01/2014  2:00 PM  Peri-wound Assessment Erythema (non-blanchable) 12/01/2014  2:00 PM  Wound Length (cm) 3 cm 11/30/2014  1:00 PM  Wound Width (cm) 1.5 cm 11/30/2014  1:00 PM  Margins Unattached edges (unapproximated) 12/01/2014  2:00 PM  Closure Sutures (proximal and distal wound) 12/01/2014  2:00 PM  Drainage Amount Moderate 12/01/2014  2:00 PM  Drainage Description Serosanguineous 12/01/2014  2:00 PM  Treatment Debridement (Selective);Hydrotherapy (Pulse lavage) 12/01/2014  2:00 PM     Wound / Incision (Open or Dehisced) 11/30/14 Incision - Dehisced Leg Right;Distal;Lower;Anterior HYRDO distal dehised wound (Active)   Dressing Type Gauze (Comment);Silver hydrofiber 12/01/2014  2:00 PM  Dressing Changed Changed 11/30/2014  1:00 PM  Dressing Status Intact;Old drainage 12/01/2014  2:00 PM  Dressing Change Frequency Daily 12/01/2014  2:00 PM  Site / Wound Assessment Black;Yellow;Pink;Red 12/01/2014  2:00 PM  % Wound base Red or Granulating 10% 12/01/2014  2:00 PM  % Wound base Yellow 75% 12/01/2014  2:00 PM  % Wound base Black 15% 12/01/2014  2:00 PM  Peri-wound Assessment Erythema (non-blanchable) 12/01/2014  2:00 PM  Wound Length (cm) 5.5 cm 11/30/2014  1:00 PM  Wound Width (cm) 4 cm 11/30/2014  1:00 PM  Margins Unattached edges (unapproximated) 12/01/2014  2:00 PM  Closure dehised incision   Drainage Amount Moderate 12/01/2014  2:00 PM  Drainage Description Serosanguineous 12/01/2014  2:00 PM  Treatment Debridement (Selective);Hydrotherapy (Pulse lavage) 12/01/2014  2:00 PM  Hydrotherapy Pulsed lavage therapy - wound location: proximal and distal dehised wound, necrotic black eschar of distal wound Pulsed Lavage with Suction (psi): 8 psi Pulsed Lavage with Suction - Normal Saline Used: 1000 mL Pulsed Lavage Tip: Tip with splash shield   Wound Assessment and Plan  Wound Therapy - Assess/Plan/Recommendations Wound Therapy - Clinical Statement: Pt would benefit from hydrotherapy to assist with cleansing dehised wound sites to promote healing environment Wound Therapy - Functional Problem List: wound culture growing staph Hydrotherapy Plan: Debridement;Patient/family education;Pulsatile lavage with suction Wound Therapy - Frequency: 6X / week Wound Therapy - Follow Up Recommendations: Skilled nursing facility Wound Plan: Continue hydrotherapy to remove nonhealing tissue and promote healing environment  Wound Therapy Goals- Improve the function of patient's integumentary system by progressing the wound(s) through the phases of  wound healing (inflammation - proliferation - remodeling) by: Decrease Necrotic Tissue to: 0% Decrease Necrotic Tissue - Progress: Progressing toward goal Increase Granulation Tissue to: 100% Increase Granulation Tissue - Progress: Progressing toward goal Improve Drainage Characteristics: Min Improve Drainage Characteristics - Progress: Progressing toward goal Goals/treatment plan/discharge plan were made with and agreed upon by patient/family: Yes Time For Goal Achievement: 7 days Wound Therapy - Potential for Goals: Good  Goals will be updated until maximal potential achieved or discharge criteria met.  Discharge criteria: when goals achieved, discharge from hospital, MD decision/surgical intervention, no progress towards goals, refusal/missing three consecutive treatments without notification or medical reason.  GP     LEMYRE,KATHrine E 12/01/2014, 2:25 PM Carmelia Bake, PT, DPT 12/01/2014 Pager: [phone]

## 2014-12-01 NOTE — Clinical Social Work Note (Signed)
Clinical Social Work Assessment  Patient Details  Name: Vanessa Mason MRN: [id number] Date of Birth: 03-20-1918  Date of referral:  12/01/14               Reason for consult:  Discharge Planning                Permission sought to share information with:  Family Supports Permission granted to share information::  Yes, Verbal Permission Granted  Name::        Agency::     Relationship::     Contact Information:  Dtr-in-law and son  Housing/Transportation Living arrangements for the past 2 months:  [place] of Information:  Patient Patient Interpreter Needed:  None Criminal Activity/Legal Involvement Pertinent to Current Situation/Hospitalization:  No - Comment as needed Significant Relationships:  Other Family Members Lives with:  Facility Resident Do you feel safe going back to the place where you live?  Yes Need for family participation in patient care:  Yes (Comment)  Care giving concerns:  Patient aware that she is not strong enough and requires too much medical attention to return to independent living. Patient agreeable to rehab at [place].   Social Worker assessment / plan:  CSW met with patient and dtr-in-law at bedside in order to discuss DC plans. Per MD, patient will require IV antibiotics and hydrotherapy at DC. Patient and family agreeable to rehab at [place] because patient knows she will require more assistance. Family has been in contact with [facility] but agreeable for CSW to contact facility at well.  CSW spoke with Rosendo Gros at [facility] who reports that they can accept patient to rehab with medical needs. SNF is checking on hydrotherapy needs and CSW is working with CM to determine how patient will receive therapy at DC. CSW will continue to follow and assist with transfer once medically stable.  Employment status:  Retired Nurse, adult PT Recommendations:  [place] /  Referral to community resources:  [place]  Patient/Family's Response to care:  Patient and family grateful that patient lives in a community where she can receive higher level of care when needed.  Patient/Family's Understanding of and Emotional Response to Diagnosis, Current Treatment, and Prognosis:  Patient reports she is ready to DC to get back home where she is comfortable. Patient reports she is always compliant with treatment so she realizes she will need more assistance but is happy that she will get treatment. Patient is confident that she can rehab and return back to her independent apartment at [facility].  Emotional Assessment Appearance:  Appears younger than stated age Attitude/Demeanor/Rapport:  Other Affect (typically observed):  Appropriate Orientation:  Oriented to Self, Oriented to Place, Oriented to  Time, Oriented to Situation Alcohol / Substance use:  Not Applicable Psych involvement (Current and /or in the community):  No (Comment)  Discharge Needs  Concerns to be addressed:  Discharge Planning Concerns Readmission within the last 30 days:  No Current discharge risk:  None Barriers to Discharge:  Continued Medical Work up   [employer], [place] 12/01/2014, 1:22 PM [phone]

## 2014-12-01 NOTE — Progress Notes (Signed)
Patient ID: Vanessa Mason, female   DOB: 1918/02/14, 81 y.o.   MRN: [id number] [facility] PROGRESS NOTE  TRAM [facility]ENN OFB:[id number] DOB: 1917-11-13 DOA: 11/27/2014 PCP: Garret Reddish, MD  Brief narrative:    81 y.o. 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 Orthopedics have consulted on the patient and have recommended hydrotherapy and Santyl.  Barrier to discharge: Dr. Migdalia Dk of plastic surgery will see the patient in consultation. Patient will be discharged to skilled nursing facility likely 12/02/2014 with Ancef to continue for 6 more days. Midline needs to be placed prior to discharge.   Assessment/Plan:    Principal Problem:  Cellulitis and abscess of leg / surgical wound infection / dehiscence of external surgical wound - Patient initially on vancomycin and Zosyn. Wound culture growing MSSA. - Based on sensitivity report antibiotics narrowed down to Ancef starting 11/28/2014. She will continue abx for 6 more days on discharge. - Blood cultures to date are negative. - Patient was seen by orthopedic surgery in consultation and so far there are no plans for debridement. Recommendation is to allow healing with hydrotherapy incidental. - Plastic surgery will see the patient in consultation either today or tomorrow morning.  Active Problems:  Hypertension - Continue losartan.   Depression - Continue Celexa.    Dyslipidemia - Continue omega-3 supplementation   DVT Prophylaxis  - Lovenox subcutaneous ordered   Code Status: DNR/DNI  Family Communication:  Granddaughter (who is an ID physician) was updated by telephone. Please note that she would like daily updates and her contact number is [phone]. Disposition Plan: To skilled nursing facility likely 12/02/2014.   IV access:  Peripheral IV  Procedures and diagnostic studies:     No results found.  Medical Consultants:  Orthopedic surgery Plastic surgery ID - Dr. Baxter Flattery - phone call only   Other Consultants:  PT  IAnti-Infectives:   [place] 11/28/2014 --> 11/30/2014  Zosyn 11/28/2014 Ancef 11/28/2014 -->      Leisa Lenz, MD  [facility] Pager [phone]  Time spent in minutes: 25 minutes  If 7PM-7AM, please contact night-coverage [email or web address] Password [email or web address] 12/01/2014, 10:11 AM   LOS: 4 days    HPI/Subjective: No acute overnight events. Patient reports no pain at this time.   Objective: Filed Vitals:   11/30/14 0441 11/30/14 1434 11/30/14 2113 12/01/14 0453  BP: 167/58 153/61 132/63 136/77  Pulse: 81 81 81 84  Temp: 98.3 F (36.8 C) 98.1 F (36.7 C) 98.2 F (36.8 C) 98.1 F (36.7 C)  TempSrc: Oral Oral Oral Oral  Resp: 16 18 18 18   Height:      Weight:      SpO2: 94% 96% 96% 97%    Intake/Output Summary (Last 24 hours) at 12/01/14 1011 Last data filed at 12/01/14 0702  Gross per 24 hour  Intake   1310 ml  Output   1100 ml  Net    210 ml    Exam:   General:  Pt is alert, follows commands appropriately, not in acute distress  Cardiovascular: Regular rate and rhythm, S1/S2, no murmurs  Respiratory: Clear to auscultation bilaterally, no wheezing, no crackles, no rhonchi  Abdomen: Soft, non tender, non distended, bowel sounds present  Extremities: right anterior shin wound with dressing in place , pulses DP and PT palpable bilaterally  Neuro: Grossly nonfocal  Data Reviewed: Basic Metabolic Panel:  Recent Labs Lab 11/27/14 1906  11/29/14 0617 11/30/14 0410 11/30/14 0754 12/01/14 0415  NA 135 136 137  --  139  K 4.6 4.1 4.1  --  3.9  CL 97* 98* 98*  --  99*  CO2 28 31 33*  --  32  GLUCOSE 111* 96 97  --  97  BUN 23* 16 20  --  15  CREATININE 0.75 0.64 0.69 0.63 0.66  CALCIUM 8.9 8.4* 8.9  --  9.1   Liver Function Tests: No results for input(s): AST, ALT, ALKPHOS, BILITOT, PROT, ALBUMIN in the last 168  hours. No results for input(s): LIPASE, AMYLASE in the last 168 hours. No results for input(s): AMMONIA in the last 168 hours. CBC:  Recent Labs Lab 11/27/14 1906 12/01/14 0415  WBC 12.3* 7.4  NEUTROABS 9.5*  --   HGB 12.4 11.0*  HCT 38.5 34.2*  MCV 92.5 91.2  PLT 222 306   Cardiac Enzymes: No results for input(s): CKTOTAL, CKMB, CKMBINDEX, TROPONINI in the last 168 hours. BNP: Invalid input(s): POCBNP CBG: No results for input(s): GLUCAP in the last 168 hours.  Recent Results (from the past 240 hour(s))  Culture, blood (routine x 2)     Status: None (Preliminary result)   Collection Time: 11/27/14  7:15 PM  Result Value Ref Range Status   Specimen Description BLOOD LAC  Final   Special Requests BOTTLES DRAWN AEROBIC AND ANAEROBIC 5CC  Final   Culture   Final    NO GROWTH 3 DAYS Performed at [facility]    Report Status PENDING  Incomplete  Culture, blood (routine x 2)     Status: None (Preliminary result)   Collection Time: 11/27/14  7:23 PM  Result Value Ref Range Status   Specimen Description BLOOD LFA  Final   Special Requests BOTTLES DRAWN AEROBIC ONLY 5CC  Final   Culture   Final    NO GROWTH 3 DAYS Performed at [facility]    Report Status PENDING  Incomplete  Wound culture     Status: None   Collection Time: 11/27/14 11:43 PM  Result Value Ref Range Status   Specimen Description WOUND RIGHT LEG  Final   Special Requests NONE  Final   Gram Stain   Final    NO WBC SEEN NO SQUAMOUS EPITHELIAL CELLS SEEN RARE GRAM POSITIVE COCCI IN PAIRS IN CLUSTERS Performed at [facility]    Culture   Final    MODERATE STAPHYLOCOCCUS AUREUS    Report Status 11/30/2014 FINAL  Final   Organism ID, Bacteria STAPHYLOCOCCUS AUREUS  Final      Susceptibility   Staphylococcus aureus - MIC*    CLINDAMYCIN RESISTANT      ERYTHROMYCIN RESISTANT      GENTAMICIN <=0.5 SENSITIVE Sensitive     LEVOFLOXACIN <=0.12 SENSITIVE Sensitive     OXACILLIN  <=0.25 SENSITIVE Sensitive     PENICILLIN >=0.5 RESISTANT Resistant     RIFAMPIN <=0.5 SENSITIVE Sensitive     TRIMETH/SULFA <=10 SENSITIVE Sensitive     VANCOMYCIN 1 SENSITIVE Sensitive     TETRACYCLINE <=1 SENSITIVE Sensitive     MOXIFLOXACIN <=0.25 SENSITIVE Sensitive     * MODERATE STAPHYLOCOCCUS AUREUS  Stat Gram stain     Status: None   Collection Time: 11/28/14 12:46 PM  Result Value Ref Range Status   Specimen Description WOUND  Final   Special Requests Normal  Final   Gram Stain   Final    FEW GRAM POSITIVE COCCI WBC  PRESENT, PREDOMINANTLY PMN Gram Stain Report Called to,Read Back By and Verified With: Monica at 1559 on 11/28/14 by S.VanHoorne    Report Status 11/28/2014 FINAL  Final  MRSA PCR Screening     Status: None   Collection Time: 11/29/14  5:54 PM  Result Value Ref Range Status   MRSA by PCR NEGATIVE NEGATIVE Final     Scheduled Meds: . acidophilus   Oral BID  . calcium carbonate  1 tablet Oral Q breakfast   And  . magnesium oxide  400 mg Oral Q breakfast  .  ceFAZolin (ANCEF) IV  1 g Intravenous Q8H  . cholecalciferol  2,000 Units Oral Daily  . citalopram  10 mg Oral Daily  . collagenase   Topical BID  . cyclobenzaprine  5 mg Oral QHS  . enoxaparin (LOVENOX) injection  40 mg Subcutaneous Q24H  . losartan  50 mg Oral Daily  . multivitamin  1 tablet Oral Daily  . multivitamin with minerals  1 tablet Oral BID  . omega-3 acid ethyl esters  1 g Oral Daily

## 2014-12-01 NOTE — Progress Notes (Signed)
Physical Therapy Treatment Patient Details Name: Vanessa Mason MRN: [id number] DOB: 09-Mar-1918 Today's Date: 12-24-2014    History of Present Illness 81 y.o. 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  Follow Up Recommendations  SNF     Equipment Recommendations  None recommended by PT    Recommendations for Other Services       Precautions / Restrictions Precautions Precautions: Fall    Mobility  Bed Mobility Overal bed mobility: Modified Independent                Transfers Overall transfer level: Needs assistance Equipment used: Rolling walker (2 wheeled) Transfers: Sit to/from Stand Sit to Stand: Min guard Stand pivot transfers: Min guard       General transfer comment: close guard for safety  Ambulation/Gait Ambulation/Gait assistance: Min guard;Supervision Ambulation Distance (Feet): 400 Feet Assistive device: Rolling walker (2 wheeled) Gait Pattern/deviations: Step-through pattern;Antalgic     General Gait Details: pt continues to report pain with flat R foot contact so educated to use forefoot for pain control, pt performed half of distance with full foot contact and half forefoot per pt preference, cues for shorter steps with full foot contact    Stairs            Wheelchair Mobility    Modified Rankin (Stroke Patients Only)       Balance                                    Cognition Arousal/Alertness: Awake/alert Behavior During Therapy: WFL for tasks assessed/performed Overall Cognitive Status: Within Functional Limits for tasks assessed                      Exercises      General Comments        Pertinent Vitals/Pain Pain Assessment: No/denies pain    Home Living                       Prior Function            PT Goals (current goals can now be found in the care plan section) Progress towards PT goals: Progressing toward goals    Frequency  Min 3X/week    PT Plan Current plan remains appropriate    Co-evaluation             End of Session   Activity Tolerance: Patient tolerated treatment well Patient left: in bed;with call bell/phone within reach     Time: 1245-8099 PT Time Calculation (min) (ACUTE ONLY): 10 min  Charges:  $Gait Training: 8-22 mins                    G Codes:      Vanessa Mason,Vanessa Mason December 24, 2014, 2:17 PM Vanessa Mason, PT, DPT 12/24/2014 Pager: [phone]

## 2014-12-02 ENCOUNTER — Encounter (HOSPITAL_COMMUNITY): Payer: Self-pay | Admitting: Plastic Surgery

## 2014-12-02 DIAGNOSIS — L97919 Non-pressure chronic ulcer of unspecified part of right lower leg with unspecified severity: Secondary | ICD-10-CM

## 2014-12-02 LAB — CULTURE, BLOOD (ROUTINE X 2)
Culture: NO GROWTH
Culture: NO GROWTH

## 2014-12-02 LAB — PREALBUMIN: Prealbumin: 12.7 mg/dL — ABNORMAL LOW (ref 18–38)

## 2014-12-02 MED ORDER — SODIUM CHLORIDE 0.9 % IJ SOLN: 10.0000 mL | INTRAMUSCULAR | Status: DC | PRN

## 2014-12-02 MED ORDER — CYCLOBENZAPRINE HCL 10 MG PO TABS: 5.0000 mg | ORAL_TABLET | Freq: Every day | ORAL | Status: DC

## 2014-12-02 MED ORDER — OXYCODONE-ACETAMINOPHEN 5-325 MG PO TABS: 0.5000 | ORAL_TABLET | ORAL | Status: DC | PRN

## 2014-12-02 MED ORDER — COLLAGENASE 250 UNIT/GM EX OINT: TOPICAL_OINTMENT | Freq: Every day | CUTANEOUS | Status: DC

## 2014-12-02 MED ORDER — VITAMIN C 500 MG PO TABS: 500.0000 mg | ORAL_TABLET | Freq: Every day | ORAL | Status: DC

## 2014-12-02 MED ORDER — POLYETHYLENE GLYCOL 3350 17 G PO PACK: 17.0000 g | PACK | Freq: Every day | ORAL | Status: DC | PRN

## 2014-12-02 MED ORDER — ZINC 30 MG PO TABS: 60.0000 mg | ORAL_TABLET | Freq: Every day | ORAL | Status: DC

## 2014-12-02 MED ORDER — CEFAZOLIN SODIUM 1-5 GM-% IV SOLN: 1.0000 g | Freq: Three times a day (TID) | INTRAVENOUS | Status: DC

## 2014-12-02 MED ORDER — SODIUM CHLORIDE 0.9 % IJ SOLN
10.0000 mL | Freq: Two times a day (BID) | INTRAMUSCULAR | Status: DC
  Administered 2014-12-02: 10 mL

## 2014-12-02 MED ORDER — CITALOPRAM HYDROBROMIDE 10 MG PO TABS: 10.0000 mg | ORAL_TABLET | Freq: Every day | ORAL | Status: DC

## 2014-12-02 MED ORDER — ACETAMINOPHEN ER 650 MG PO TBCR: 650.0000 mg | EXTENDED_RELEASE_TABLET | ORAL | Status: DC | PRN

## 2014-12-02 NOTE — Progress Notes (Signed)
Dr. Migdalia Dk ok that pt discharged today and outpt surgery likey next week, her office will contact the pt to arrange the date for surgery  Leisa Lenz [facility] [phone]

## 2014-12-02 NOTE — Consult Note (Signed)
Reason for Consult:right leg ulcer Referring Physician: Primary team  Vanessa Mason is an 81 y.o. female.  HPI: The patient is a 81 yrs old wf here for treatment of a right swollen red leg.  She underwent excision of a skin cancer and the area appears to have gotten infected.  She underwent local care with dressing changes and antibiotics have been given.  The area is said to be improving slowly.  On exam today the redness and swelling as marked from previously has improved.  There is nonviable tissue in the wound bed.  Santyl is being used at present with silver dressings.  The pedal pulse is present.  There are two areas on the anterior lower leg.  Past Medical History  Diagnosis Date  . BACK PAIN 03/08/2007  . DEPRESSIVE DISORDER 12/01/2008  . HYPERLIPIDEMIA 03/09/2006  . HYPERTENSION 03/09/2006  . KNEE PAIN 05/06/2009  . GI bleed 09/12/2010    Naproxen induced Endoscopy with dr bucini   . Urinary urgency   . HIATAL HERNIA 12/17/2006  . Arthritis   . Hypothyroidism     Past Surgical History  Procedure Laterality Date  . Abdominal hysterectomy    . Oophorectomy    . Thyroidectomy    . Rotator cuff repair    . Laminectomy      with fusion  . Cardiac catheterization    . Arthroscopic knee Right   . Hernia repair    . Tubal ligation    . Resection distal clavical    . Back surgery      spinal fusion    Family History  Problem Relation Age of Onset  . Cancer Mother 89  . Cancer Brother     Social History:  reports that she has never smoked. She does not have any smokeless tobacco history on file. She reports that she does not use illicit drugs. Her alcohol history is not on file.  Allergies:  Allergies  Allergen Reactions  . Naproxen     Gi bleed  . Nsaids   . Ace Inhibitors     Cough   . Aspirin     GI bleed    Medications: I have reviewed the patient's current medications.  Results for orders placed or performed during the hospital encounter of 11/27/14  (from the past 48 hour(s))  Basic metabolic panel     Status: Abnormal   Collection Time: 12/01/14  4:15 AM  Result Value Ref Range   Sodium 139 135 - 145 mmol/L   Potassium 3.9 3.5 - 5.1 mmol/L   Chloride 99 (L) 101 - 111 mmol/L   CO2 32 22 - 32 mmol/L   Glucose, Bld 97 65 - 99 mg/dL   BUN 15 6 - 20 mg/dL   Creatinine, Ser 0.66 0.44 - 1.00 mg/dL   Calcium 9.1 8.9 - 10.3 mg/dL   GFR calc non Af Amer >60 >60 mL/min   GFR calc Af Amer >60 >60 mL/min    Comment: (NOTE) The eGFR has been calculated using the CKD EPI equation. This calculation has not been validated in all clinical situations. eGFR's persistently <60 mL/min signify possible Chronic Kidney Disease.    Anion gap 8 5 - 15  CBC     Status: Abnormal   Collection Time: 12/01/14  4:15 AM  Result Value Ref Range   WBC 7.4 4.0 - 10.5 K/uL   RBC 3.75 (L) 3.87 - 5.11 MIL/uL   Hemoglobin 11.0 (L) 12.0 - 15.0 g/dL     HCT 34.2 (L) 36.0 - 46.0 %   MCV 91.2 78.0 - 100.0 fL   MCH 29.3 26.0 - 34.0 pg   MCHC 32.2 30.0 - 36.0 g/dL   RDW 14.3 11.5 - 15.5 %   Platelets 306 150 - 400 K/uL    No results found.  Review of Systems  Constitutional: Negative.   HENT: Negative.   Eyes: Negative.   Respiratory: Negative.   Cardiovascular: Positive for leg swelling.  Gastrointestinal: Negative.   Musculoskeletal: Positive for joint pain.  Skin: Negative.   Neurological: Negative.   Psychiatric/Behavioral: Negative.    Blood pressure 172/69, pulse 95, temperature 98 F (36.7 C), temperature source Oral, resp. rate 18, height 4' 11.5" (1.511 m), weight 58.1 kg (128 lb 1.4 oz), SpO2 100 %. Physical Exam  Constitutional: She is oriented to person, place, and time. She appears well-developed and well-nourished.  HENT:  Head: Normocephalic and atraumatic.  Eyes: Conjunctivae and EOM are normal. Pupils are equal, round, and reactive to light.  Cardiovascular: Normal rate.   Respiratory: Effort normal.  Neurological: She is alert and  oriented to person, place, and time.  Skin: There is erythema.  Psychiatric: She has a normal mood and affect. Her behavior is normal. Judgment and thought content normal.    Assessment/Plan: Recommend checking a prealbumin, adding multivitamin, Vit C 500 mg daily and zinc 60 mg daily.  The leg should be elevated.  Continue dressing changes.  Recommend OR for debridement and possible Acell and VAC placement.  SANGER,CLAIRE 12/02/2014, 7:56 AM      

## 2014-12-02 NOTE — Progress Notes (Signed)
PT HYDRO Cancellation Note  Patient Details Name: Vanessa Mason MRN: [id number] DOB: 1917/06/11   Cancelled Treatment:     Reviewed note from this morning from surgery stated pt for possible surgery and/or VAC placement. If pt going to surgery no need for HYDRO at this time. Will hold  HYDRO and continue to monitor pt's chart for our need. Spoke with pt and family and they agree.    Clide Dales 12/02/2014, 12:24 PM  Clide Dales, PT Pager: [phone] 12/02/2014

## 2014-12-02 NOTE — Discharge Summary (Signed)
Physician Discharge Summary  Vanessa Mason FGH:[id number] DOB: [birth year] DOA: 11/27/2014  PCP: Garret Reddish, MD  Admit date: 11/27/2014 Discharge date: 12/02/2014  Recommendations for Outpatient Follow-up:  Continue ancef through 12/06/14 Dr. Migdalia Dk will arrange outpatient surgery Take vitamin C and zinc tablet as prescribed Wet to dry dressing daily changes with santyl on necrotic tissue  Discharge Diagnoses:  Principal Problem:   Cellulitis and abscess of leg Active Problems:   Essential hypertension   Depression   Surgical wound infection   Dehiscence of external surgical wound   Ulcer of right lower leg    Discharge Condition: stable   Diet recommendation: as tolerated   History of present illness:  81 y.o. 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 Orthopedics have consulted on the patient and have recommended hydrotherapy and Santyl.  Dr. Migdalia Dk of plastic surgery recommends debridement and wound vac next week oupt Dressing changes as above indicated   Hospital Course:    Assessment/Plan:    Principal Problem:  Cellulitis and abscess of leg / surgical wound infection / dehiscence of external surgical wound - Patient initially on vancomycin and Zosyn. Wound culture growing MSSA. - Based on sensitivity report antibiotics narrowed down to Ancef starting 11/28/2014. She will continue abx until 12/06/14 per ID recommendations (total of 10 days since her first dose was 7/1). - Blood cultures to date are negative. - Plastic surgery recommends debridement and wound vac outpt next week  - Added vitamin C and zinc table per plastic surgery recommendations   Active Problems:  Hypertension - Continue losartan.   Depression - Continue Celexa.   Dyslipidemia - Continue omega-3 supplementation   DVT Prophylaxis  - Lovenox subcutaneous  ordered in hospital    Code Status: DNR/DNI  Family Communication: Granddaughter (who is an ID physician) was updated by telephone. Please note that she would like daily updates and her contact number is [phone].    IV access:  Peripheral IV  Procedures and diagnostic studies:   No results found.  Medical Consultants:  Orthopedic surgery Plastic surgery ID - Dr. Baxter Flattery - phone call only   Other Consultants:  PT  IAnti-Infectives:   Vanco 11/28/2014 --> 11/30/2014  Zosyn 11/28/2014 Ancef 11/28/2014 --> through 12/06/2014      Signed:  Leisa Lenz, MD  [facility] 12/02/2014, 3:28 PM  Pager #: [phone]  Time spent in minutes: more than 30 minutes    Discharge Exam: Filed Vitals:   12/02/14 1436  BP: 115/60  Pulse: 88  Temp: 98.2 F (36.8 C)  Resp: 18   Filed Vitals:   12/01/14 1308 12/01/14 2127 12/02/14 0547 12/02/14 1436  BP: 121/62 144/64 172/69 115/60  Pulse: 76 77 95 88  Temp: 98.4 F (36.9 C) 98.2 F (36.8 C) 98 F (36.7 C) 98.2 F (36.8 C)  TempSrc: Oral Oral Oral Oral  Resp: 16 16 18 18   Height:      Weight:      SpO2: 98% 96% 100% 98%    General: Pt is alert, follows commands appropriately, not in acute distress Cardiovascular: Regular rate and rhythm, S1/S2 +, no murmurs Respiratory: Clear to auscultation bilaterally, no wheezing, no crackles, no rhonchi Abdominal: Soft, non tender, non distended, bowel sounds +, no guarding Extremities: dressing in place, pulses palpable bilaterally DP and PT Neuro: Grossly nonfocal  Discharge Instructions  Discharge Instructions    Call MD for:  difficulty breathing, headache or visual disturbances    Complete by:  As directed      Call MD for:  persistant nausea and vomiting    Complete by:  As directed      Call MD for:  severe uncontrolled pain    Complete by:  As directed      Diet - low sodium heart healthy    Complete by:  As directed      Discharge  instructions    Complete by:  As directed   Continue ancef through 12/06/14 Dr. Migdalia Dk will arrange outpatient surgery Take vitamin C and zinc tablet as prescribed Wet to dry dressing daily changes with santyl on necrotic tissue     Discharge wound care:    Complete by:  As directed   Wet to dry with santyl on necrotic tissue     Increase activity slowly    Complete by:  As directed             Medication List    STOP taking these medications        cephALEXin 500 MG capsule  Commonly known as:  KEFLEX     HYDROcodone-acetaminophen 5-325 MG per tablet  Commonly known as:  NORCO/VICODIN     traMADol 50 MG tablet  Commonly known as:  ULTRAM      TAKE these medications        acetaminophen 650 MG CR tablet  Commonly known as:  TYLENOL  Take 1 tablet (650 mg total) by mouth as needed (for pain).     Calcium-Magnesium 300-300 MG Tabs  Take 2 tablets by mouth daily.     ceFAZolin 1-5 GM-%  Commonly known as:  ANCEF  Inject 50 mLs (1 g total) into the vein every 8 (eight) hours.     citalopram 10 MG tablet  Commonly known as:  CELEXA  Take 1 tablet (10 mg total) by mouth daily.     collagenase ointment  Commonly known as:  SANTYL  Apply topically daily. Wet to dry dressing changes with santyl to necrotic tissue     cyclobenzaprine 10 MG tablet  Commonly known as:  FLEXERIL  Take 0.5 tablets (5 mg total) by mouth at bedtime.     Krill Oil 1000 MG Caps  Take 1,000 mg by mouth daily.     losartan 50 MG tablet  Commonly known as:  COZAAR  Take 1 tablet (50 mg total) by mouth daily.     Lutein 20 MG Caps  Take 20 mg by mouth daily.     multivitamin tablet  Take 1 tablet by mouth 2 (two) times daily.     mupirocin ointment 2 %  Commonly known as:  BACTROBAN  Apply 1 application topically 2 (two) times daily as needed (skin infection).     oxyCODONE-acetaminophen 5-325 MG per tablet  Commonly known as:  PERCOCET/ROXICET  Take 0.5-1 tablets by mouth every 4  (four) hours as needed for moderate pain or severe pain.     PHOSPHATIDYLSERINE PO  Take 1,000 mg by mouth 2 (two) times daily.     polyethylene glycol packet  Commonly known as:  MIRALAX / GLYCOLAX  Take 17 g by mouth daily as needed for mild constipation.     PROBIOTIC DAILY PO  Take 1 tablet by mouth 2 (two) times daily.     triamcinolone cream 0.1 %  Commonly known as:  KENALOG  Apply 1 application topically daily as needed (skin irritation).  vitamin C 500 MG tablet  Commonly known as:  ASCORBIC ACID  Take 1 tablet (500 mg total) by mouth daily.     Vitamin D 2000 UNITS Caps  Take 2,000 Units by mouth daily.     Zinc 30 MG Tabs  Take 2 tablets (60 mg total) by mouth daily.           Follow-up Information    Follow up with Garret Reddish, MD. Schedule an appointment as soon as possible for a visit in 1 week.   Specialty:  Family Medicine   Why:  Follow up appt after recent hospitalization   Contact information:   [place] [address] [phone]       Follow up with [facility], DO.   Specialty:  Plastic Surgery   Contact information:   [place] [address] [phone]        The results of significant diagnostics from this hospitalization (including imaging, microbiology, ancillary and laboratory) are listed below for reference.    Significant Diagnostic Studies: Dg Chest 2 View  11/12/2014   CLINICAL DATA:  Preoperative radiograph for knee replacement.  EXAM: CHEST  2 VIEW  COMPARISON:  02/25/2007 chest radiograph.  FINDINGS: Emphysema. Lumbar rod and screw fixation is partially visible. Small hiatal hernia. Basilar atelectasis. Chronic blunting of the costophrenic angles compatible with scarring. This is unchanged compared to 2008. Aortic arch atherosclerosis. Cardiopericardial silhouette within normal limits. Tortuous thoracic aorta. Resection of the distal RIGHT clavicle.  IMPRESSION: Emphysema and  small hiatal hernia. No active cardiac present no acute cardiopulmonary disease.   Electronically Signed   By: Dereck Ligas M.D.   On: 11/12/2014 15:31    Microbiology: Recent Results (from the past 240 hour(s))  Culture, blood (routine x 2)     Status: None   Collection Time: 11/27/14  7:15 PM  Result Value Ref Range Status   Specimen Description BLOOD LAC  Final   Special Requests BOTTLES DRAWN AEROBIC AND ANAEROBIC 5CC  Final   Culture   Final    NO GROWTH 5 DAYS Performed at [facility]    Report Status 12/02/2014 FINAL  Final  Culture, blood (routine x 2)     Status: None   Collection Time: 11/27/14  7:23 PM  Result Value Ref Range Status   Specimen Description BLOOD LFA  Final   Special Requests BOTTLES DRAWN AEROBIC ONLY 5CC  Final   Culture   Final    NO GROWTH 5 DAYS Performed at [facility]    Report Status 12/02/2014 FINAL  Final  Wound culture     Status: None   Collection Time: 11/27/14 11:43 PM  Result Value Ref Range Status   Specimen Description WOUND RIGHT LEG  Final   Special Requests NONE  Final   Gram Stain   Final    NO WBC SEEN NO SQUAMOUS EPITHELIAL CELLS SEEN RARE GRAM POSITIVE COCCI IN PAIRS IN CLUSTERS Performed at [facility]    Culture   Final    MODERATE STAPHYLOCOCCUS AUREUS Note: RIFAMPIN AND GENTAMICIN SHOULD NOT BE USED AS SINGLE DRUGS FOR TREATMENT OF STAPH INFECTIONS. This organism is presumed to be Clindamycin resistant based on detection of inducible Clindamycin resistance. Performed at [facility]    Report Status 11/30/2014 FINAL  Final   Organism ID, Bacteria STAPHYLOCOCCUS AUREUS  Final      Susceptibility   Staphylococcus aureus - MIC*    CLINDAMYCIN RESISTANT  ERYTHROMYCIN RESISTANT      GENTAMICIN <=0.5 SENSITIVE Sensitive     LEVOFLOXACIN <=0.12 SENSITIVE Sensitive     OXACILLIN <=0.25 SENSITIVE Sensitive     PENICILLIN >=0.5 RESISTANT Resistant     RIFAMPIN <=0.5 SENSITIVE  Sensitive     TRIMETH/SULFA <=10 SENSITIVE Sensitive     VANCOMYCIN 1 SENSITIVE Sensitive     TETRACYCLINE <=1 SENSITIVE Sensitive     MOXIFLOXACIN <=0.25 SENSITIVE Sensitive     * MODERATE STAPHYLOCOCCUS AUREUS  Stat Gram stain     Status: None   Collection Time: 11/28/14 12:46 PM  Result Value Ref Range Status   Specimen Description WOUND  Final   Special Requests Normal  Final   Gram Stain   Final    FEW GRAM POSITIVE COCCI WBC PRESENT, PREDOMINANTLY PMN Gram Stain Report Called to,Read Back By and Verified With: Monica at 1559 on 11/28/14 by S.[place]Hoorne    Report Status 11/28/2014 FINAL  Final  MRSA PCR Screening     Status: None   Collection Time: 11/29/14  5:54 PM  Result Value Ref Range Status   MRSA by PCR NEGATIVE NEGATIVE Final    Comment:        The GeneXpert MRSA Assay (FDA approved for NASAL specimens only), is one component of a comprehensive MRSA colonization surveillance program. It is not intended to diagnose MRSA infection nor to guide or monitor treatment for MRSA infections.      Labs: Basic Metabolic Panel:  Recent Labs Lab 11/27/14 1906 11/29/14 0617 11/30/14 0410 11/30/14 0754 12/01/14 0415  NA 135 136 137  --  139  K 4.6 4.1 4.1  --  3.9  CL 97* 98* 98*  --  99*  CO2 28 31 33*  --  32  GLUCOSE 111* 96 97  --  97  BUN 23* 16 20  --  15  CREATININE 0.75 0.64 0.69 0.63 0.66  CALCIUM 8.9 8.4* 8.9  --  9.1   Liver Function Tests: No results for input(s): AST, ALT, ALKPHOS, BILITOT, PROT, ALBUMIN in the last 168 hours. No results for input(s): LIPASE, AMYLASE in the last 168 hours. No results for input(s): AMMONIA in the last 168 hours. CBC:  Recent Labs Lab 11/27/14 1906 12/01/14 0415  WBC 12.3* 7.4  NEUTROABS 9.5*  --   HGB 12.4 11.0*  HCT 38.5 34.2*  MCV 92.5 91.2  PLT 222 306   Cardiac Enzymes: No results for input(s): CKTOTAL, CKMB, CKMBINDEX, TROPONINI in the last 168 hours. BNP: BNP (last 3 results) No results for  input(s): BNP in the last 8760 hours.  ProBNP (last 3 results) No results for input(s): PROBNP in the last 8760 hours.  CBG: No results for input(s): GLUCAP in the last 168 hours.

## 2014-12-02 NOTE — Progress Notes (Signed)
Peripherally Inserted Central Catheter/Midline Placement  The IV Nurse has discussed with the patient and/or persons authorized to consent for the patient, the purpose of this procedure and the potential benefits and risks involved with this procedure.  The benefits include less needle sticks, lab draws from the catheter and patient may be discharged home with the catheter.  Risks include, but not limited to, infection, bleeding, blood clot (thrombus formation), and puncture of an artery; nerve damage and irregular heat beat.  Alternatives to this procedure were also discussed.  PICC/Midline Placement Documentation  PICC / Midline Single Lumen 03/88/82 PICC Right Basilic 13 cm 0 cm (Active)  Indication for Insertion or Continuance of Line Home intravenous therapies (PICC only) 12/02/2014 12:00 PM  Exposed Catheter (cm) 0 cm 12/02/2014 12:00 PM  Dressing Change Due 12/09/14 12/02/2014 12:00 PM       Jule Economy Horton 12/02/2014, 12:15 PM

## 2014-12-02 NOTE — Clinical Social Work Placement (Signed)
   CLINICAL SOCIAL WORK PLACEMENT  NOTE  Date:  12/02/2014  Patient Details  Name: Vanessa Mason MRN: [id number] Date of Birth: 1918-05-08  Clinical Social Work is seeking post-discharge placement for this patient at the [place] level of care (*CSW will initial, date and re-position this form in  chart as items are completed):  No   Patient/family provided with [facility] Work Department's list of facilities offering this level of care within the geographic area requested by the patient (or if unable, by the patient's family).  Yes   Patient/family informed of their freedom to choose among providers that offer the needed level of care, that participate in Medicare, Medicaid or managed care program needed by the patient, have an available bed and are willing to accept the patient.  No   Patient/family informed of [facility]'s ownership interest in [facility] and [facility], as well as of the fact that they are under no obligation to receive care at these facilities.  PASRR submitted to EDS on       PASRR number received on       Existing PASRR number confirmed on       FL2 transmitted to all facilities in geographic area requested by pt/family on 12/02/14     FL2 transmitted to all facilities within larger geographic area on       Patient informed that his/her managed care company has contracts with or will negotiate with certain facilities, including the following:        Yes   Patient/family informed of bed offers received.  Patient chooses bed at [facility]     Physician recommends and patient chooses bed at      Patient to be transferred to [facility] on 12/02/14.  Patient to be transferred to facility by [facility]     Patient family notified on 12/02/14 of transfer.  Name of family member notified:  Patient spoke with family on phone when [place] present.      PHYSICIAN       Additional Comment:     _______________________________________________ Boone Master, LCSW 12/02/2014, 4:15 PM

## 2014-12-02 NOTE — Discharge Instructions (Signed)
Wound Infection °A wound infection happens when a type of germ (bacteria) starts growing in the wound. In some cases, this can cause the wound to break open. If cared for properly, the infected wound will heal from the inside to the outside. Wound infections need treatment. °CAUSES °An infection is caused by bacteria growing in the wound.  °SYMPTOMS  °· Increase in redness, swelling, or pain at the wound site. °· Increase in drainage at the wound site. °· Wound or bandage (dressing) starts to smell bad. °· Fever. °· Feeling tired or fatigued. °· Pus draining from the wound. °TREATMENT  °Your health care provider will prescribe antibiotic medicine. The wound infection should improve within 24 to 48 hours. Any redness around the wound should stop spreading and the wound should be less painful.  °HOME CARE INSTRUCTIONS  °· Only take over-the-counter or prescription medicines for pain, discomfort, or fever as directed by your health care provider. °· Take your antibiotics as directed. Finish them even if you start to feel better. °· Gently wash the area with mild soap and water 2 times a day, or as directed. Rinse off the soap. Pat the area dry with a clean towel. Do not rub the wound. This may cause bleeding. °· Follow your health care provider's instructions for how often you need to change the dressing. °· Apply ointment and a dressing to the wound as directed. °· If the dressing sticks, moisten it with soapy water and gently remove it. °· Change the bandage right away if it becomes wet, dirty, or develops a bad smell. °· Take showers. Do not take tub baths, swim, or do anything that may soak the wound until it is healed. °· Avoid exercises that make you sweat heavily. °· Use anti-itch medicine as directed by your health care provider. The wound may itch when it is healing. Do not pick or scratch at the wound. °· Follow up with your health care provider to get your wound rechecked as directed. °SEEK MEDICAL CARE  IF: °· You have an increase in swelling, pain, or redness around the wound. °· You have an increase in the amount of pus coming from the wound. °· There is a bad smell coming from the wound. °· More of the wound breaks open. °· You have a fever. °MAKE SURE YOU:  °· Understand these instructions. °· Will watch your condition. °· Will get help right away if you are not doing well or get worse. °Document Released: 02/11/2003 Document Revised: 05/20/2013 Document Reviewed: 09/18/2010 °ExitCare® Patient Information ©2015 [employer]. This information is not intended to replace advice given to you by your health care provider. Make sure you discuss any questions you have with your health care provider. ° °

## 2014-12-02 NOTE — Progress Notes (Signed)
Clinical Social Work  CSW faxed DC summary to [facility] who is agreeable to accept patient today. CSW prepared DC packet with FL2, DNR, DC summary and hard scripts included. CSW informed patient of DC and she called family while CSW in room. RN to call report. [facility] will come to hospital and transport patient back to SNF. SNF to take DC packet with them when they arrive to get patient.  CSW is signing off but available if needed.  [place], [place] [phone]

## 2014-12-02 NOTE — Progress Notes (Signed)
Clinical Social Work  CSW spoke with RN and MD re: DC plans. MD reports that patient will be evaluated at 2pm to determine further treatment and possible wound vac placement. CSW left a message with SNF to update them on medical needs. CSW will continue to follow.  Sindy Messing, LCSW (Coverage for [employer])

## 2014-12-02 NOTE — Progress Notes (Signed)
Report called to Junie Panning, Therapist, sports at [facility] [facility]

## 2014-12-03 ENCOUNTER — Other Ambulatory Visit: Payer: Self-pay | Admitting: Plastic Surgery

## 2014-12-03 DIAGNOSIS — L97912 Non-pressure chronic ulcer of unspecified part of right lower leg with fat layer exposed: Secondary | ICD-10-CM

## 2014-12-04 ENCOUNTER — Encounter: Payer: Self-pay | Admitting: Adult Health

## 2014-12-04 NOTE — Progress Notes (Signed)
Patient ID: Vanessa Mason, female   DOB: May 12, 1918, 81 y.o.   MRN: [id number]   I received a call from [facility] facility regarding her cellulitis. Her child had taken a picture of her leg and sent the picture to the patient's grand child; who is an infectious disease physician in [place]. The I/D physician called [facility] and spoke with 2 hospitalists and the surgeon. According the to facility Dr. Rockne Menghini agreed with the grandchild and called in orders to the facility to extend the ancef through 12-10-14. The facility needed advice on how to further proceed; as Dr. Rockne Menghini is not a practicing physician at the facility.   I spoke with Dr. Sheppard Coil who is on call with me to discuss this situation. We both feel it is inappropriate to carry out orders based upon a cell photo. Being that it is Friday night; we feel as though this situation can wait until Monday. If the family feels as though her situation requires further intervention she can proceed to the ED at that time.

## 2014-12-08 ENCOUNTER — Other Ambulatory Visit: Payer: Self-pay | Admitting: Plastic Surgery

## 2014-12-08 ENCOUNTER — Encounter: Payer: Self-pay | Admitting: Internal Medicine

## 2014-12-08 ENCOUNTER — Encounter (HOSPITAL_COMMUNITY)
Admission: RE | Admit: 2014-12-08 | Discharge: 2014-12-08 | Disposition: A | Discharge status: Home / Self Care | Payer: Medicare HMO | Source: Ambulatory Visit | Attending: Plastic Surgery | Admitting: Plastic Surgery

## 2014-12-08 ENCOUNTER — Encounter (HOSPITAL_COMMUNITY): Payer: Self-pay

## 2014-12-08 ENCOUNTER — Non-Acute Institutional Stay (SKILLED_NURSING_FACILITY): Payer: Medicare HMO | Admitting: Internal Medicine

## 2014-12-08 VITALS — BP 154/68 | HR 93 | Temp 99.2°F | Resp 18 | Ht 59.5 in | Wt 127.0 lb

## 2014-12-08 DIAGNOSIS — K449 Diaphragmatic hernia without obstruction or gangrene: Secondary | ICD-10-CM | POA: Diagnosis not present

## 2014-12-08 DIAGNOSIS — L03115 Cellulitis of right lower limb: Secondary | ICD-10-CM

## 2014-12-08 DIAGNOSIS — G5622 Lesion of ulnar nerve, left upper limb: Secondary | ICD-10-CM | POA: Diagnosis not present

## 2014-12-08 DIAGNOSIS — C44722 Squamous cell carcinoma of skin of right lower limb, including hip: Secondary | ICD-10-CM | POA: Diagnosis not present

## 2014-12-08 DIAGNOSIS — M7122 Synovial cyst of popliteal space [Baker], left knee: Secondary | ICD-10-CM

## 2014-12-08 DIAGNOSIS — M545 Low back pain, unspecified: Secondary | ICD-10-CM

## 2014-12-08 DIAGNOSIS — Z85828 Personal history of other malignant neoplasm of skin: Secondary | ICD-10-CM | POA: Diagnosis not present

## 2014-12-08 DIAGNOSIS — I10 Essential (primary) hypertension: Secondary | ICD-10-CM | POA: Diagnosis not present

## 2014-12-08 DIAGNOSIS — L97912 Non-pressure chronic ulcer of unspecified part of right lower leg with fat layer exposed: Secondary | ICD-10-CM

## 2014-12-08 DIAGNOSIS — G709 Myoneural disorder, unspecified: Secondary | ICD-10-CM | POA: Diagnosis not present

## 2014-12-08 DIAGNOSIS — M1990 Unspecified osteoarthritis, unspecified site: Secondary | ICD-10-CM | POA: Diagnosis not present

## 2014-12-08 DIAGNOSIS — F329 Major depressive disorder, single episode, unspecified: Secondary | ICD-10-CM | POA: Diagnosis not present

## 2014-12-08 DIAGNOSIS — T8130XS Disruption of wound, unspecified, sequela: Secondary | ICD-10-CM

## 2014-12-08 DIAGNOSIS — M1712 Unilateral primary osteoarthritis, left knee: Secondary | ICD-10-CM

## 2014-12-08 DIAGNOSIS — J449 Chronic obstructive pulmonary disease, unspecified: Secondary | ICD-10-CM | POA: Diagnosis not present

## 2014-12-08 DIAGNOSIS — E890 Postprocedural hypothyroidism: Secondary | ICD-10-CM | POA: Diagnosis not present

## 2014-12-08 DIAGNOSIS — T8189XA Other complications of procedures, not elsewhere classified, initial encounter: Secondary | ICD-10-CM | POA: Diagnosis not present

## 2014-12-08 HISTORY — DX: Personal history of other diseases of the digestive system: Z87.19

## 2014-12-08 HISTORY — DX: Malignant (primary) neoplasm, unspecified: C80.1

## 2014-12-08 HISTORY — DX: Pneumonia, unspecified organism: J18.9

## 2014-12-08 LAB — BASIC METABOLIC PANEL
Anion gap: 8 (ref 5–15)
BUN: 21 mg/dL — ABNORMAL HIGH (ref 6–20)
CO2: 28 mmol/L (ref 22–32)
Calcium: 9.3 mg/dL (ref 8.9–10.3)
Chloride: 102 mmol/L (ref 101–111)
Creatinine, Ser: 0.65 mg/dL (ref 0.44–1.00)
GFR calc Af Amer: >60 mL/min (ref >60)
GFR calc non Af Amer: >60 mL/min (ref >60)
Glucose, Bld: 106 mg/dL — ABNORMAL HIGH (ref 65–99)
Potassium: 3.8 mmol/L (ref 3.5–5.1)
Sodium: 138 mmol/L (ref 135–145)

## 2014-12-08 LAB — CBC
HCT: 35.5 % — ABNORMAL LOW (ref 36.0–46.0)
Hemoglobin: 11.6 g/dL — ABNORMAL LOW (ref 12.0–15.0)
MCH: 29.7 pg (ref 26.0–34.0)
MCHC: 32.7 g/dL (ref 30.0–36.0)
MCV: 91 fL (ref 78.0–100.0)
Platelets: 295 10*3/uL (ref 150–400)
RBC: 3.9 MIL/uL (ref 3.87–5.11)
RDW: 14.6 % (ref 11.5–15.5)
WBC: 9.5 10*3/uL (ref 4.0–10.5)

## 2014-12-08 NOTE — Progress Notes (Signed)
At short stay to assess MIDLINE. Flushes well, good blood return, WNL. Pt misinformed that line was a "PICC" and sent from facility to have evaluated for lack of blood return. Pt and daughter educated, line is in fact a 13cm MIDLINE. All questions answered.

## 2014-12-08 NOTE — Progress Notes (Signed)
Patient ID: Vanessa Mason, female   DOB: 1918/02/28, 81 y.o.   MRN: [id number]  Provider:  Rexene Edison. Mariea Clonts, D.O., C.M.D. Location:  [facility]  PCP: Garret Reddish, MD  Code Status: DNR Goals of Care: Advanced Directive information Does patient have an advance directive?: Yes, Type of Advance Directive: [place];Living will;Out of facility DNR (pink MOST or yellow form), Pre-existing out of facility DNR order (yellow form or pink MOST form): Yellow form placed in chart (order not valid for inpatient use), Does patient want to make changes to advanced directive?: No - Patient declined   Chief Complaint  Patient presents with  . New Admit To SNF    new admission to rehab s/p hospitalization 7/1-12/02/14 with right anterior shin cellulitis and dehiscence of Mohs surgical site for squamous cell carcinoma    HPI: 81 y.o. female with h/o low back pain, left knee OA, htn, hyperlipidemia, partial thyroidectomy, depression, and recent squamous cell ca of her right shin.  She underwent a Mohs procedure for this, then developed cellulitis of the right shin and dehiscence of the wound.  She was hospitalized from 7/1-7/6 for this infection.  She was treated with vanc 7/2-4, zosyn 7/2, and ancef from 7/2 ongoing (after culture grew out MSSA).  She is receiving vitamin C and zinc as per plastic surgery recommendations and has wet to dry dressings with santyl on the necrotic areas.  She is to have her preop visit this afternoon pending debridement and wound vac placement by Dr. Migdalia Dk for 7/14.    7/8, [place] NP was called b/c a picture of the patient's wound was taken by the patient's daughter and sent to her granddaughter, an ID physician.  The physician felt the antibiotics (ancef) should be continued beyond the initial 7/10 stop date (10 day course).  Our practice on call NP and physician did not agree to this change, but one of the hospitalists had agreed.  She  remains on ancef at present as a result of this order.  Apparently, the patient's granddaughter had also contacted Dr. Leafy Ro office and they did not want the abx continued.    When I saw [name] today, she was doing very well--ambulating with her walker on the leg.  She was elevating it at rest as directed.  She has minimal pain and has only been using tylenol for it.  Her nurse was changing her dressings and I assessed the two wounds at that time.    She has wet to dry dressings on both wounds and santyl to the granulation tissue. Her daughter and son were present when I arrived and again requested for the antibiotic (ancef) to be continued until her debridement procedure on Thurs, 7/14.  I contacted Dr. Leafy Ro office again, and was told that no further antibiotics should be ordered unless there are s/s of active infection b/c the wound may be cultured or a tissue sample taken during the procedure.  I explained the dangers of ongoing antibiotics to her children especially c diff.  They have ordered probiotics for her.  We also discussed the risk of yeast infection of her PICC with use of probiotics, but they were going to check with the ID granddaughter in [place].  Her bp was slightly elevated today.  She is taking cozaar 50mg  daily with breakfast.    She also has chronic low back pain, but does not complain of any pain today.  She takes percocet for this at  home and does have it if needed.  ROS: Review of Systems  Constitutional: Negative for fever, chills and malaise/fatigue.  HENT: Negative for congestion.   Respiratory: Negative for cough and shortness of breath.   Cardiovascular: Positive for leg swelling. Negative for chest pain and palpitations.       Very mild right lower leg edema  Gastrointestinal: Negative for abdominal pain, diarrhea, constipation, blood in stool and melena.  Genitourinary: Negative for dysuria, urgency and frequency.  Musculoskeletal: Positive for joint pain.  Negative for falls.       In left knee which gives out sometimes, but not bothersome at present; walks with her walker  Skin:       Two wounds were mohs was done on right shin  Neurological: Negative for dizziness, loss of consciousness, weakness and headaches.  Psychiatric/Behavioral: Negative for depression and memory loss.    Past Medical History  Diagnosis Date  . BACK PAIN 03/08/2007  . DEPRESSIVE DISORDER 12/01/2008  . HYPERLIPIDEMIA 03/09/2006  . HYPERTENSION 03/09/2006  . KNEE PAIN 05/06/2009  . GI bleed 09/12/2010    Naproxen induced Endoscopy with dr Harlene Ramus   . Urinary urgency   . HIATAL HERNIA 12/17/2006  . Arthritis   . Hypothyroidism    Past Surgical History  Procedure Laterality Date  . Abdominal hysterectomy    . Oophorectomy    . Thyroidectomy    . Rotator cuff repair    . Laminectomy      with fusion  . Cardiac catheterization    . Arthroscopic knee Right   . Hernia repair    . Tubal ligation    . Resection distal clavical    . Back surgery      spinal fusion   Social History:   reports that she has never smoked. She does not have any smokeless tobacco history on file. She reports that she does not use illicit drugs. Her alcohol history is not on file.  Family History  Problem Relation Age of Onset  . Cancer Mother 10  . Cancer Brother     Allergies  Allergen Reactions  . Naproxen     Gi bleed  . Nsaids   . Ace Inhibitors     Cough   . Aspirin     GI bleed    Medications: Patient's Medications  New Prescriptions   No medications on file  Previous Medications   ACETAMINOPHEN (TYLENOL) 650 MG CR TABLET    Take 1 tablet (650 mg total) by mouth as needed (for pain).   CALCIUM-MAGNESIUM 300-300 MG TABS    Take 2 tablets by mouth daily.   CHOLECALCIFEROL (VITAMIN D) 2000 UNITS CAPS    Take 2,000 Units by mouth daily.    CITALOPRAM (CELEXA) 10 MG TABLET    Take 1 tablet (10 mg total) by mouth daily.   COLLAGENASE (SANTYL) OINTMENT    Apply  topically daily. Wet to dry dressing changes with santyl to necrotic tissue   CYCLOBENZAPRINE (FLEXERIL) 10 MG TABLET    Take 0.5 tablets (5 mg total) by mouth at bedtime.   KRILL OIL 1000 MG CAPS    Take 1,000 mg by mouth daily.    LOSARTAN (COZAAR) 50 MG TABLET    Take 1 tablet (50 mg total) by mouth daily.   LUTEIN 20 MG CAPS    Take 20 mg by mouth daily.    MULTIPLE VITAMIN (MULTIVITAMIN) TABLET    Take 1 tablet by mouth 2 (two) times daily.  MUPIROCIN OINTMENT (BACTROBAN) 2 %    Apply 1 application topically 2 (two) times daily as needed (skin infection).    OXYCODONE-ACETAMINOPHEN (PERCOCET/ROXICET) 5-325 MG PER TABLET    Take 0.5-1 tablets by mouth every 4 (four) hours as needed for moderate pain or severe pain.   PHOSPHATIDYLSERINE PO    Take 1,000 mg by mouth 2 (two) times daily.   POLYETHYLENE GLYCOL (MIRALAX / GLYCOLAX) PACKET    Take 17 g by mouth daily as needed for mild constipation.   PROBIOTIC PRODUCT (PROBIOTIC DAILY PO)    Take 1 tablet by mouth 2 (two) times daily.    TRIAMCINOLONE CREAM (KENALOG) 0.1 %    Apply 1 application topically daily as needed (skin irritation).    VITAMIN C (ASCORBIC ACID) 500 MG TABLET    Take 1 tablet (500 mg total) by mouth daily.   ZINC 30 MG TABS    Take 2 tablets (60 mg total) by mouth daily.  Modified Medications   No medications on file  Discontinued Medications   No medications on file     Physical Exam: Filed Vitals:   12/08/14 1017  BP: 155/90  Pulse: 89  Temp: 97.4 F (36.3 C)  Resp: 16  Height: 5' (1.524 m)  Weight: 127 lb 9.6 oz (57.879 kg)  SpO2: 93%   Body mass index is 24.92 kg/(m^2). Physical Exam  Constitutional: She is oriented to person, place, and time. She appears well-developed and well-nourished. No distress.  HENT:  Head: Normocephalic and atraumatic.  Right Ear: External ear normal.  Left Ear: External ear normal.  Nose: Nose normal.  Mouth/Throat: Oropharynx is clear and moist. No oropharyngeal  exudate.  Eyes: Conjunctivae and EOM are normal. Pupils are equal, round, and reactive to light.  Neck: Normal range of motion. Neck supple. No JVD present. No thyromegaly present.  Cardiovascular: Normal rate, regular rhythm, normal heart sounds and intact distal pulses.   No murmur heard. Pulmonary/Chest: Effort normal and breath sounds normal. No respiratory distress.  Abdominal: Soft. Bowel sounds are normal.  Musculoskeletal: Normal range of motion.  Left knee with crepitus, no tenderness  Neurological: She is alert and oriented to person, place, and time.  Skin:  Right shin with two wounds present:  Superior wound without any surrounding erythema, has yellow granulation tissue on 100% of open area; inferior wound larger, has small amt of brown tissue on lateral aspect, but otherwise yellow granulation tissue present and slight erythema and edema on medial aspect at border of wound; no drainage, odor; does have mild warmth of entire right lower leg vs. Her left lower leg      Labs reviewed: Basic Metabolic Panel:  Recent Labs  11/29/14 0617 11/30/14 0410 11/30/14 0754 12/01/14 0415  NA 136 137  --  139  K 4.1 4.1  --  3.9  CL 98* 98*  --  99*  CO2 31 33*  --  32  GLUCOSE 96 97  --  97  BUN 16 20  --  15  CREATININE 0.64 0.69 0.63 0.66  CALCIUM 8.4* 8.9  --  9.1   Liver Function Tests:  Recent Labs  12/30/13 0958 11/12/14 1207  AST 23 23  ALT 15 13*  ALKPHOS 50 62  BILITOT 1.0 0.9  PROT 6.3 7.1  ALBUMIN 4.0 4.2   No results for input(s): LIPASE, AMYLASE in the last 8760 hours. No results for input(s): AMMONIA in the last 8760 hours. CBC:  Recent Labs  11/12/14 1207  11/27/14 1906 12/01/14 0415  WBC 8.7 12.3* 7.4  NEUTROABS 5.5 9.5*  --   HGB 13.3 12.4 11.0*  HCT 41.0 38.5 34.2*  MCV 90.7 92.5 91.2  PLT 217 222 306   Cardiac Enzymes: No results for input(s): CKTOTAL, CKMB, CKMBINDEX, TROPONINI in the last 8760 hours. BNP: Invalid input(s):  POCBNP  No results found for: HGBA1C Lab Results  Component Value Date   TSH 0.99 12/30/2013   No results found for: VITAMINB12 No results found for: FOLATE No results found for: IRON, TIBC, FERRITIN  Imaging and Procedures obtained prior to SNF admission: 10/28/14:  CT ankle left hip/ankle/knee w/o contrast:  Osteoarthritis of the knee with extensive chondrocalcinosis, loose bodies, and prominent Baker's cyst.Slight arthritis of the left hip with calcific tendinopathy and atrophy of the left gluteus medius and minimus muscles.  11/12/14:  CXR:  Emphysema and small hiatal hernia. No active cardiac present no acute cardiopulmonary disease.  Patient Care Team: Marin Olp, MD as PCP - General (Family Medicine)  Assessment/Plan 1. Cellulitis of leg, right -with MSSA in wound culture -due to granddaughter's adamant request, on call provider gave an order to cont ancef through today's 4pm dose, but she is not receive anymore abx before her debridement procedure on Thursday, 7/14, as per plastic surgery -nursing requested that the PICC team assess pt's picc while she's at her preop visit today--cannot draw from it but is working for the abx administration -wound looks very good and lacks s/s of active infection--just needs debridement   2. Squamous cell carcinoma, leg, right -s/p MOHS procedure, then wounds dehisced and became infected  3. Dehiscence of wound of skin, sequela -wounds remain open and are improving and plan is for debridement on 7/14 by Dr. Migdalia Dk, she had her preop visit this afternoon  4. Primary osteoarthritis of left knee -not bothering her at present; using tylenol and also has percocet if needed for pain  5. Baker's cyst, unruptured, left -also contributing to left knee pain -will need to f/u with Dr. Ronnie Derby when she completes the course of treatment for her right leg  6. Essential hypertension, benign -bp elevated today, will cont to monitor--suspect this  is due to anxiety about her upcoming procedure b/c she does not seem to have much pain  7. Midline low back pain without sciatica -not bothering her right now, cont tylenol if mild and percocet if severe  Functional status:  Is independent in ADLs, but here for therapy and IV abx--ambulating with walker  Family/ staff Communication: spoke with her daughter and son who were present, rehab nurse, nurse manager, DON and nurse at Dr. Leafy Ro office about this resident today  Labs/tests ordered:   Hurbert Duran L. Helyne Genther, D.O. [facility] [address], [place] [address] Cell Phone (Mon-Fri 8am-5pm):  [phone] On Call:  [phone] & follow prompts after 5pm & weekends Office Phone:  [phone] Office Fax:  [phone]

## 2014-12-08 NOTE — Progress Notes (Signed)
I spoke with Lelon Frohlich at [facility] regarding patient's [employer]. She states that they cannot get a blood return but " it flushes beautifully", and wanted to know if Anesthesia could look at it today.  She has spoken with Dr. Joneen Caraway who is following the patient, and he stated it was okay to continue using for her antibiotics.  I informed her that Anesthesia does not see the patient in that respect during PAT appointment, and that they can assess it the DOS, as long as the patient is able to get her antibiotics until then.

## 2014-12-08 NOTE — Progress Notes (Signed)
I spoke with Dr. Roberts Gaudy about patient's Pic line and asked if it's okay to assess on the day of surgery.  He requested that we have IV team look at it today when Pt comes for PAT appointment.  IV team, Lisa< notified and she stated to put the IV consult order in when Pt arrives today, and they will be on the look out for it then come to PAT to assess.

## 2014-12-08 NOTE — Progress Notes (Signed)
Pt. & family requesting, pt. Eat or drink something after 12 mn.  Call to A. Kabbe,NP, discussed request & she states that its the anesth. Dept. Order to not allow PO intake within 9 hrs. Of surgery, with approval of Dr. Migdalia Dk.  Pt. & family agree to take the concern up with a call to Dr. Migdalia Dk for her approval.  Pt. & family aware that 0630 would the limit from which she could eat or drink prior to surgery.

## 2014-12-08 NOTE — Pre-Procedure Instructions (Signed)
Vanessa Mason  12/08/2014      [id number] [place], [place] [address] [place] [address] Phone: [phone] Fax: [phone]  [facility] [address] [place] [address] [place] [address] Phone: [phone] Fax: [phone]    Your procedure is scheduled on 12/10/2014.  Report to [facility] Admitting at 1:45 P. M.  Call this number if you have problems the morning of surgery:  [phone]   Remember:  Do not eat food or drink liquids after midnight.  Take these medicines the morning of surgery with A SIP OF WATER : Celexa, Tylenol as needed    Do not wear jewelry, make-up or nail polish.   Do not wear lotions, powders, or perfumes.  You may wear deodorant.   Do not shave 48 hours prior to surgery.     Do not bring valuables to the hospital.   [facility] is not responsible for any belongings or valuables.  Contacts, dentures or bridgework may not be worn into surgery.  Leave your suitcase in the car.  After surgery it may be brought to your room.  For patients admitted to the hospital, discharge time will be determined by your treatment team.  Patients discharged the day of surgery will not be allowed to drive home.   Name and phone number of your driver:   /w family Special instructions:  Special Instructions: [place] - Preparing for Surgery  Before surgery, you can play an important role.  Because skin is not sterile, your skin needs to be as free of germs as possible.  You can reduce the number of germs on you skin by washing with CHG (chlorahexidine gluconate) soap before surgery.  CHG is an antiseptic cleaner which kills germs and bonds with the skin to continue killing germs even after washing.  Please DO NOT use if you have an allergy to CHG or antibacterial soaps.  If your skin becomes reddened/irritated stop using the CHG and inform  your nurse when you arrive at Short Stay.  Do not shave (including legs and underarms) for at least 48 hours prior to the first CHG shower.  You may shave your face.  Please follow these instructions carefully:   1.  Shower with CHG Soap the night before surgery and the  morning of Surgery.  2.  If you choose to wash your hair, wash your hair first as usual with your  normal shampoo.  3.  After you shampoo, rinse your hair and body thoroughly to remove the  Shampoo.  4.  Use CHG as you would any other liquid soap.  You can apply chg directly to the skin and wash gently with scrungie or a clean washcloth.  5.  Apply the CHG Soap to your body ONLY FROM THE NECK DOWN.    Do not use on open wounds or open sores.  Avoid contact with your eyes, ears, mouth and genitals (private parts).  Wash genitals (private parts)   with your normal soap.  6.  Wash thoroughly, paying special attention to the area where your surgery will be performed.  7.  Thoroughly rinse your body with warm water from the neck down.  8.  DO NOT shower/wash with your normal soap after using and rinsing off   the CHG Soap.  9.  Pat yourself dry with a clean towel.  10.  Wear clean pajamas.            11.  Place clean sheets on your bed the night of your first shower and do not sleep with pets.  Day of Surgery  Do not apply any lotions/deodorants the morning of surgery.  Please wear clean clothes to the hospital/surgery center.  Please read over the following fact sheets that you were given. Pain Booklet, Coughing and Deep Breathing and Surgical Site Infection Prevention

## 2014-12-09 MED ORDER — CEFAZOLIN SODIUM-DEXTROSE 2-3 GM-% IV SOLR
2.0000 g | INTRAVENOUS | Status: AC
  Administered 2014-12-10: 2 g via INTRAVENOUS
  Filled 2014-12-09: qty 50

## 2014-12-09 NOTE — Progress Notes (Signed)
Anesthesia Chart Review: Patient is a 81 year old female scheduled for debridement of right leg ulcer with Acell and VAC placement on 12/10/14 by Dr. Migdalia Dk. Patient's surgery is not until 1545. PAT RN spoke with Jonah Blue, FNP-BC regarding NPO status.  Instructed she should be NPO for 8 hours prior to surgery (to allow for time if surgery was moved up) but would also have to be approved by Dr. Migdalia Dk since she would know best if there was a chance that patient's case could be moved up.   History includes non-smoker, HTN, HLD, depression, thyroidectomy with hypothyroidism, hiatal hernia, skin cancer, GI bleed, arthritis, back surgery, hysterectomy. She was admitted 11/27/14 with cellulitis and abscess involving the right leg. She is s/p Mohs procedure for [facility] of her right anterior shin and and had wound dehiscence and cellulitis. Cultures showed MSSA and she was changed from vancomycin and Zosyn to Ancef. Wound VAC and debridement was scheduled. Of note, patient has a MIDLINE IV that was evaluated by [facility]'s IV team during her PAT visit due to lack of blood return. Documentation from [facility], indicates the line flushed well with good blood return during her assessment. Patient resides at [facility].   PCP is listed as Dr. Garret Reddish. Cardiologist is Dr. Mare Ferrari, last visit 10/09/14. He was seeing her for surgical clearance for left TKR (Dr. Ronnie Derby). Dr. Mare Ferrari cleared for from a cardiac standpoint for that procedure which was scheduled for 11/16/14, but it doesn't appear that she ever underwent this procedure. He cleared her based on a recent low risk stress test and exam findings. He did not order an echo.   Meds include Celexa, Flexeril, losartan, Percocet.   10/09/14 EKG: NSR with sinus arrhythmia, non-specific IVCD, cannot rule out anterior infarct (age undetermined), T wave abnormality consider inferolateral ischemia. Comparison EKG on 07/30/13 showed left BBB.  09/24/13 Nuclear stress test: LV Ejection  Fraction: 36%. LV Wall Motion: Global LV hypokinesis, paradoxical septal motion related to LBBB and moderately depressed overall LV systolic function. Overall Impression: Low risk stress nuclear study with a small and mild fixed basal inferolateral defect. No reversible ischemia is seen. Findings are most suggestive of nonischemic cardiomyopathy, but balanced ischemia cannot be entirely excluded. (Dr. Dani Gobble Croitoru)  (According to 04/01/07 notes, EF was 53% by non-ischemic Myoview on 03/07/07.)  11/12/14 CXR: IMPRESSION: Emphysema and small hiatal hernia. No active cardiac present no acute cardiopulmonary disease.  Preoperative labs noted.   Patient with recent cardiology evaluation, and low risk (non-ischemic) stress test last year--although EF 36%.  Discussed with anesthesiologist Dr. Linna Caprice who agrees that if no acute CV/CHF symptoms then would anticipate that she could proceed as planned.  George Hugh [facility] Short Stay Center/Anesthesiology Phone [phone] 12/09/2014 2:56 PM

## 2014-12-10 ENCOUNTER — Encounter (HOSPITAL_COMMUNITY)
Admission: RE | Disposition: A | Discharge status: Home / Self Care | Payer: Self-pay | Source: Ambulatory Visit | Attending: Plastic Surgery

## 2014-12-10 ENCOUNTER — Ambulatory Visit (HOSPITAL_COMMUNITY): Payer: Medicare HMO | Admitting: Vascular Surgery

## 2014-12-10 ENCOUNTER — Encounter (HOSPITAL_COMMUNITY): Payer: Self-pay | Admitting: *Deleted

## 2014-12-10 ENCOUNTER — Ambulatory Visit (HOSPITAL_COMMUNITY)
Admission: RE | Admit: 2014-12-10 | Discharge: 2014-12-10 | Disposition: A | Discharge status: Home / Self Care | Payer: Medicare HMO | Source: Ambulatory Visit | Attending: Plastic Surgery | Admitting: Plastic Surgery

## 2014-12-10 ENCOUNTER — Ambulatory Visit (HOSPITAL_COMMUNITY): Payer: Medicare HMO | Admitting: Anesthesiology

## 2014-12-10 DIAGNOSIS — Z85828 Personal history of other malignant neoplasm of skin: Secondary | ICD-10-CM | POA: Insufficient documentation

## 2014-12-10 DIAGNOSIS — T8189XA Other complications of procedures, not elsewhere classified, initial encounter: Secondary | ICD-10-CM | POA: Diagnosis not present

## 2014-12-10 DIAGNOSIS — G709 Myoneural disorder, unspecified: Secondary | ICD-10-CM | POA: Insufficient documentation

## 2014-12-10 DIAGNOSIS — E890 Postprocedural hypothyroidism: Secondary | ICD-10-CM | POA: Diagnosis not present

## 2014-12-10 DIAGNOSIS — F329 Major depressive disorder, single episode, unspecified: Secondary | ICD-10-CM | POA: Insufficient documentation

## 2014-12-10 DIAGNOSIS — J449 Chronic obstructive pulmonary disease, unspecified: Secondary | ICD-10-CM | POA: Insufficient documentation

## 2014-12-10 DIAGNOSIS — I10 Essential (primary) hypertension: Secondary | ICD-10-CM | POA: Insufficient documentation

## 2014-12-10 DIAGNOSIS — L97912 Non-pressure chronic ulcer of unspecified part of right lower leg with fat layer exposed: Secondary | ICD-10-CM

## 2014-12-10 DIAGNOSIS — K449 Diaphragmatic hernia without obstruction or gangrene: Secondary | ICD-10-CM | POA: Insufficient documentation

## 2014-12-10 DIAGNOSIS — M1990 Unspecified osteoarthritis, unspecified site: Secondary | ICD-10-CM | POA: Insufficient documentation

## 2014-12-10 HISTORY — PX: APPLICATION OF A-CELL OF EXTREMITY: SHX6303

## 2014-12-10 HISTORY — PX: I & D EXTREMITY: SHX5045

## 2014-12-10 HISTORY — PX: I&D EXTREMITY: SHX5045

## 2014-12-10 SURGERY — IRRIGATION AND DEBRIDEMENT EXTREMITY
Anesthesia: General | Site: Leg Lower | Laterality: Right

## 2014-12-10 MED ORDER — FENTANYL CITRATE (PF) 100 MCG/2ML IJ SOLN
INTRAMUSCULAR | Status: DC | PRN
  Administered 2014-12-10 (×2): 50 ug via INTRAVENOUS

## 2014-12-10 MED ORDER — OXYCODONE HCL 5 MG PO TABS
5.0000 mg | ORAL_TABLET | Freq: Once | ORAL | Status: AC | PRN
  Administered 2014-12-10: 5 mg via ORAL

## 2014-12-10 MED ORDER — SODIUM CHLORIDE 0.9 % IR SOLN
Status: DC | PRN
  Administered 2014-12-10: 500 mL

## 2014-12-10 MED ORDER — PHENYLEPHRINE HCL 10 MG/ML IJ SOLN
INTRAMUSCULAR | Status: DC | PRN
  Administered 2014-12-10: 80 ug via INTRAVENOUS

## 2014-12-10 MED ORDER — SODIUM CHLORIDE 0.9 % IR SOLN
Status: DC | PRN
  Administered 2014-12-10: 1000 mL

## 2014-12-10 MED ORDER — FENTANYL CITRATE (PF) 100 MCG/2ML IJ SOLN
INTRAMUSCULAR | Status: AC
  Filled 2014-12-10: qty 2

## 2014-12-10 MED ORDER — EPHEDRINE SULFATE 50 MG/ML IJ SOLN
INTRAMUSCULAR | Status: DC | PRN
  Administered 2014-12-10: 10 mg via INTRAVENOUS

## 2014-12-10 MED ORDER — ONDANSETRON HCL 4 MG/2ML IJ SOLN
INTRAMUSCULAR | Status: DC | PRN
  Administered 2014-12-10: 4 mg via INTRAVENOUS

## 2014-12-10 MED ORDER — ACETAMINOPHEN 10 MG/ML IV SOLN
INTRAVENOUS | Status: AC
  Filled 2014-12-10: qty 100

## 2014-12-10 MED ORDER — FENTANYL CITRATE (PF) 100 MCG/2ML IJ SOLN
25.0000 ug | INTRAMUSCULAR | Status: DC | PRN
  Administered 2014-12-10 (×3): 50 ug via INTRAVENOUS

## 2014-12-10 MED ORDER — PHENYLEPHRINE HCL 10 MG/ML IJ SOLN
10.0000 mg | INTRAMUSCULAR | Status: DC | PRN
  Administered 2014-12-10: 25 ug/min via INTRAVENOUS

## 2014-12-10 MED ORDER — FENTANYL CITRATE (PF) 250 MCG/5ML IJ SOLN
INTRAMUSCULAR | Status: AC
  Filled 2014-12-10: qty 5

## 2014-12-10 MED ORDER — OXYCODONE HCL 5 MG/5ML PO SOLN: 5.0000 mg | Freq: Once | ORAL | Status: AC | PRN

## 2014-12-10 MED ORDER — OXYCODONE HCL 5 MG PO TABS
ORAL_TABLET | ORAL | Status: DC
Start: 2014-12-10 — End: 2014-12-10
  Filled 2014-12-10: qty 1

## 2014-12-10 MED ORDER — LACTATED RINGERS IV SOLN
INTRAVENOUS | Status: DC
  Administered 2014-12-10 (×2): via INTRAVENOUS

## 2014-12-10 MED ORDER — ONDANSETRON HCL 4 MG/2ML IJ SOLN: 4.0000 mg | Freq: Once | INTRAMUSCULAR | Status: DC | PRN

## 2014-12-10 MED ORDER — ACETAMINOPHEN 10 MG/ML IV SOLN
1000.0000 mg | Freq: Once | INTRAVENOUS | Status: AC
  Administered 2014-12-10: 1000 mg via INTRAVENOUS

## 2014-12-10 MED ORDER — PROPOFOL 10 MG/ML IV BOLUS
INTRAVENOUS | Status: DC | PRN
  Administered 2014-12-10: 40 mg via INTRAVENOUS
  Administered 2014-12-10: 100 mg via INTRAVENOUS
  Administered 2014-12-10: 20 mg via INTRAVENOUS

## 2014-12-10 MED ORDER — LIDOCAINE HCL (CARDIAC) 20 MG/ML IV SOLN
INTRAVENOUS | Status: DC | PRN
  Administered 2014-12-10: 70 mg via INTRAVENOUS

## 2014-12-10 SURGICAL SUPPLY — 43 items
BANDAGE ELASTIC 4 VELCRO ST LF (GAUZE/BANDAGES/DRESSINGS) ×1 IMPLANT
BLADE SURG ROTATE 9660 (MISCELLANEOUS) IMPLANT
BNDG GAUZE ELAST 4 BULKY (GAUZE/BANDAGES/DRESSINGS) ×1 IMPLANT
CANISTER SUCTION 2500CC (MISCELLANEOUS) ×2 IMPLANT
CHLORAPREP W/TINT 26ML (MISCELLANEOUS) IMPLANT
CONT SPECI 4OZ STER CLIK (MISCELLANEOUS) ×2 IMPLANT
COVER SURGICAL LIGHT HANDLE (MISCELLANEOUS) ×2 IMPLANT
DRAPE EXTREMITY T 121X128X90 (DRAPE) IMPLANT
DRAPE ORTHO SPLIT 77X108 STRL (DRAPES)
DRAPE SURG ORHT 6 SPLT 77X108 (DRAPES) IMPLANT
DRSG ADAPTIC 3X8 NADH LF (GAUZE/BANDAGES/DRESSINGS) ×1 IMPLANT
DRSG MEPILEX BORDER 4X4 (GAUZE/BANDAGES/DRESSINGS) ×1 IMPLANT
DRSG VAC ATS LRG SENSATRAC (GAUZE/BANDAGES/DRESSINGS) IMPLANT
DRSG VAC ATS MED SENSATRAC (GAUZE/BANDAGES/DRESSINGS) IMPLANT
DRSG VAC ATS SM SENSATRAC (GAUZE/BANDAGES/DRESSINGS) IMPLANT
ELECT REM PT RETURN 9FT ADLT (ELECTROSURGICAL) ×2
ELECTRODE REM PT RTRN 9FT ADLT (ELECTROSURGICAL) ×1 IMPLANT
GLOVE BIO SURGEON STRL SZ 6.5 (GLOVE) ×4 IMPLANT
GLOVE BIOGEL PI IND STRL 6.5 (GLOVE) IMPLANT
GLOVE BIOGEL PI INDICATOR 6.5 (GLOVE) ×2
GOWN STRL REUS W/ TWL LRG LVL3 (GOWN DISPOSABLE) ×2 IMPLANT
GOWN STRL REUS W/TWL LRG LVL3 (GOWN DISPOSABLE) ×4
KIT BASIN OR (CUSTOM PROCEDURE TRAY) ×2 IMPLANT
KIT ROOM TURNOVER OR (KITS) ×2 IMPLANT
MATRIX SURGICAL PSM 7X10CM (Tissue) ×1 IMPLANT
MICROMATRIX 1000MG (Tissue) ×2 IMPLANT
NS IRRIG 1000ML POUR BTL (IV SOLUTION) ×2 IMPLANT
PACK GENERAL/GYN (CUSTOM PROCEDURE TRAY) ×2 IMPLANT
PAD ABD 8X10 STRL (GAUZE/BANDAGES/DRESSINGS) ×1 IMPLANT
PAD ARMBOARD 7.5X6 YLW CONV (MISCELLANEOUS) ×4 IMPLANT
PAD NEG PRESSURE SENSATRAC (MISCELLANEOUS) IMPLANT
SOLUTION PARTIC MCRMTRX 1000MG (Tissue) IMPLANT
SPONGE GAUZE 4X4 12PLY STER LF (GAUZE/BANDAGES/DRESSINGS) ×1 IMPLANT
SUT MNCRL AB 4-0 PS2 18 (SUTURE) ×2 IMPLANT
SUT MON AB 5-0 PS2 18 (SUTURE) ×2 IMPLANT
SUT SILK 4 0 PS 2 (SUTURE) ×2 IMPLANT
SUT VIC AB 5-0 PS2 18 (SUTURE) ×4 IMPLANT
SWAB CULTURE LIQUID MINI MALE (MISCELLANEOUS) ×3 IMPLANT
TOWEL OR 17X24 6PK STRL BLUE (TOWEL DISPOSABLE) IMPLANT
TOWEL OR 17X26 10 PK STRL BLUE (TOWEL DISPOSABLE) ×2 IMPLANT
TUBE ANAEROBIC SPECIMEN COL (MISCELLANEOUS) ×2 IMPLANT
UNDERPAD 30X30 INCONTINENT (UNDERPADS AND DIAPERS) ×2 IMPLANT
WATER STERILE IRR 1000ML POUR (IV SOLUTION) IMPLANT

## 2014-12-10 NOTE — H&P (View-Only) (Signed)
Reason for Consult:right leg ulcer Referring Physician: Primary team  Vanessa Mason is an 81 y.o. female.  HPI: The patient is a 81 yrs old wf here for treatment of a right swollen red leg.  She underwent excision of a skin cancer and the area appears to have gotten infected.  She underwent local care with dressing changes and antibiotics have been given.  The area is said to be improving slowly.  On exam today the redness and swelling as marked from previously has improved.  There is nonviable tissue in the wound bed.  Santyl is being used at present with silver dressings.  The pedal pulse is present.  There are two areas on the anterior lower leg.  Past Medical History  Diagnosis Date  . BACK PAIN 03/08/2007  . DEPRESSIVE DISORDER 12/01/2008  . HYPERLIPIDEMIA 03/09/2006  . HYPERTENSION 03/09/2006  . KNEE PAIN 05/06/2009  . GI bleed 09/12/2010    Naproxen induced Endoscopy with dr Harlene Ramus   . Urinary urgency   . HIATAL HERNIA 12/17/2006  . Arthritis   . Hypothyroidism     Past Surgical History  Procedure Laterality Date  . Abdominal hysterectomy    . Oophorectomy    . Thyroidectomy    . Rotator cuff repair    . Laminectomy      with fusion  . Cardiac catheterization    . Arthroscopic knee Right   . Hernia repair    . Tubal ligation    . Resection distal clavical    . Back surgery      spinal fusion    Family History  Problem Relation Age of Onset  . Cancer Mother 29  . Cancer Brother     Social History:  reports that she has never smoked. She does not have any smokeless tobacco history on file. She reports that she does not use illicit drugs. Her alcohol history is not on file.  Allergies:  Allergies  Allergen Reactions  . Naproxen     Gi bleed  . Nsaids   . Ace Inhibitors     Cough   . Aspirin     GI bleed    Medications: I have reviewed the patient's current medications.  Results for orders placed or performed during the hospital encounter of 11/27/14  (from the past 48 hour(s))  Basic metabolic panel     Status: Abnormal   Collection Time: 12/01/14  4:15 AM  Result Value Ref Range   Sodium 139 135 - 145 mmol/L   Potassium 3.9 3.5 - 5.1 mmol/L   Chloride 99 (L) 101 - 111 mmol/L   CO2 32 22 - 32 mmol/L   Glucose, Bld 97 65 - 99 mg/dL   BUN 15 6 - 20 mg/dL   Creatinine, Ser 0.66 0.44 - 1.00 mg/dL   Calcium 9.1 8.9 - 10.3 mg/dL   GFR calc non Af Amer >60 >60 mL/min   GFR calc Af Amer >60 >60 mL/min    Comment: (NOTE) The eGFR has been calculated using the CKD EPI equation. This calculation has not been validated in all clinical situations. eGFR's persistently <60 mL/min signify possible Chronic Kidney Disease.    Anion gap 8 5 - 15  CBC     Status: Abnormal   Collection Time: 12/01/14  4:15 AM  Result Value Ref Range   WBC 7.4 4.0 - 10.5 K/uL   RBC 3.75 (L) 3.87 - 5.11 MIL/uL   Hemoglobin 11.0 (L) 12.0 - 15.0 g/dL  HCT 34.2 (L) 36.0 - 46.0 %   MCV 91.2 78.0 - 100.0 fL   MCH 29.3 26.0 - 34.0 pg   MCHC 32.2 30.0 - 36.0 g/dL   RDW 14.3 11.5 - 15.5 %   Platelets 306 150 - 400 K/uL    No results found.  Review of Systems  Constitutional: Negative.   HENT: Negative.   Eyes: Negative.   Respiratory: Negative.   Cardiovascular: Positive for leg swelling.  Gastrointestinal: Negative.   Musculoskeletal: Positive for joint pain.  Skin: Negative.   Neurological: Negative.   Psychiatric/Behavioral: Negative.    Blood pressure 172/69, pulse 95, temperature 98 F (36.7 C), temperature source Oral, resp. rate 18, height 4' 11.5" (1.511 m), weight 58.1 kg (128 lb 1.4 oz), SpO2 100 %. Physical Exam  Constitutional: She is oriented to person, place, and time. She appears well-developed and well-nourished.  HENT:  Head: Normocephalic and atraumatic.  Eyes: Conjunctivae and EOM are normal. Pupils are equal, round, and reactive to light.  Cardiovascular: Normal rate.   Respiratory: Effort normal.  Neurological: She is alert and  oriented to person, place, and time.  Skin: There is erythema.  Psychiatric: She has a normal mood and affect. Her behavior is normal. Judgment and thought content normal.    Assessment/Plan: Recommend checking a prealbumin, adding multivitamin, Vit C 500 mg daily and zinc 60 mg daily.  The leg should be elevated.  Continue dressing changes.  Recommend OR for debridement and possible Acell and VAC placement.  [place] 12/02/2014, 7:56 AM

## 2014-12-10 NOTE — Discharge Instructions (Signed)
KY gel to the lower right leg wound daily Wrap with kerlex and an ace wrap Examen upper wound but don't need to apply any additional dressing If the upper wound dressing gets bloody may change daily with xeroform or vaseline gauze.

## 2014-12-10 NOTE — Anesthesia Procedure Notes (Signed)
Procedure Name: LMA Insertion Date/Time: 12/10/2014 3:17 PM Performed by: Manus Gunning, HAL J Pre-anesthesia Checklist: Patient identified, Timeout performed, Emergency Drugs available, Suction available and Patient being monitored Patient Re-evaluated:Patient Re-evaluated prior to inductionOxygen Delivery Method: Circle system utilized Preoxygenation: Pre-oxygenation with 100% oxygen Intubation Type: IV induction Ventilation: Mask ventilation without difficulty LMA: LMA inserted LMA Size: 4.0 Number of attempts: 1 Placement Confirmation: breath sounds checked- equal and bilateral and positive ETCO2 Tube secured with: Tape Dental Injury: Teeth and Oropharynx as per pre-operative assessment

## 2014-12-10 NOTE — Interval H&P Note (Signed)
History and Physical Interval Note:  12/10/2014 3:05 PM  Vanessa Mason  has presented today for surgery, with the diagnosis of WOUND ON RIGHT LEG  The various methods of treatment have been discussed with the patient and family. After consideration of risks, benefits and other options for treatment, the patient has consented to  Procedure(s): IRRIGATION AND DEBRIDEMENT ON RIGHT LEG, APPLICATION OF A-CELL AND VAC (Right) as a surgical intervention .  The patient's history has been reviewed, patient examined, no change in status, stable for surgery.  I have reviewed the patient's chart and labs.  Questions were answered to the patient's satisfaction.     SANGER,CLAIRE

## 2014-12-10 NOTE — Transfer of Care (Signed)
Immediate Anesthesia Transfer of Care Note  Patient: Vanessa Mason  Procedure(s) Performed: Procedure(s): IRRIGATION AND DEBRIDEMENT ON RIGHT LEG, APPLICATION OF A-CELL AND VAC (Right)  Patient Location: PACU  Anesthesia Type:General  Level of Consciousness: awake and alert   Airway & Oxygen Therapy: Patient Spontanous Breathing and Patient connected to nasal cannula oxygen  Post-op Assessment: Report given to RN and Post -op Vital signs reviewed and stable  Post vital signs: Reviewed and stable  Last Vitals:  Filed Vitals:   12/10/14 1412  BP: 174/78  Pulse: 80  Temp: 36.8 C  Resp: 18    Complications: No apparent anesthesia complications

## 2014-12-10 NOTE — Progress Notes (Signed)
Several unsuccessful attempts removing ring from left ring finger, taped.

## 2014-12-10 NOTE — Brief Op Note (Signed)
12/10/2014  3:11 PM  PATIENT:  Vanessa Mason  81 y.o. female  PRE-OPERATIVE DIAGNOSIS:  WOUND ON RIGHT LEG  POST-OPERATIVE DIAGNOSIS:  * No post-op diagnosis entered *  PROCEDURE:  Procedure(s): IRRIGATION AND DEBRIDEMENT ON RIGHT LEG, APPLICATION OF A-CELL AND VAC (Right)  SURGEON:  Surgeon(s) and Role:    * Claire Sanger, DO - Primary  PHYSICIAN ASSISTANT: Shawn Rayburn, PA  ASSISTANTS: none   ANESTHESIA:   general  EBL:     BLOOD ADMINISTERED:none  DRAINS: none   LOCAL MEDICATIONS USED:  NONE  SPECIMEN:  Source of Specimen:  right leg tissue  DISPOSITION OF SPECIMEN:  micro and path  COUNTS:  YES  TOURNIQUET:  * No tourniquets in log *  DICTATION: .Dragon Dictation  PLAN OF CARE: Discharge to home after PACU  PATIENT DISPOSITION:  PACU - hemodynamically stable.   Delay start of Pharmacological VTE agent (>24hrs) due to surgical blood loss or risk of bleeding: no

## 2014-12-10 NOTE — Anesthesia Preprocedure Evaluation (Signed)
Anesthesia Evaluation  Patient identified by MRN, date of birth, ID band Patient awake    Reviewed: Allergy & Precautions, NPO status , Patient's Chart, lab work & pertinent test results  Airway Mallampati: II   Neck ROM: full    Dental   Pulmonary shortness of breath, COPD breath sounds clear to auscultation        Cardiovascular hypertension, Rhythm:regular Rate:Normal     Neuro/Psych Depression  Neuromuscular disease    GI/Hepatic hiatal hernia,   Endo/Other  Hypothyroidism   Renal/GU      Musculoskeletal  (+) Arthritis -,   Abdominal   Peds  Hematology   Anesthesia Other Findings   Reproductive/Obstetrics                             Anesthesia Physical Anesthesia Plan  ASA: III  Anesthesia Plan: General   Post-op Pain Management:    Induction: Intravenous  Airway Management Planned: LMA  Additional Equipment:   Intra-op Plan:   Post-operative Plan:   Informed Consent: I have reviewed the patients History and Physical, chart, labs and discussed the procedure including the risks, benefits and alternatives for the proposed anesthesia with the patient or authorized representative who has indicated his/her understanding and acceptance.     Plan Discussed with: CRNA, Anesthesiologist and Surgeon  Anesthesia Plan Comments:         Anesthesia Quick Evaluation

## 2014-12-10 NOTE — Op Note (Signed)
Operative Note   DATE OF OPERATION: 12/10/2014  LOCATION: [facility]  SURGICAL DIVISION: Plastic Surgery  PREOPERATIVE DIAGNOSES:  Right leg ulcer with history of skin cancer: superior 3 x 6 cm, inferior 5 x 7 cm  POSTOPERATIVE DIAGNOSES:  same  PROCEDURE:  Excision of right leg skin cancer superior and inferior, closure of superior wound 3 x 6 cm, preparation of right leg inferior wound for placement of Acell (powder 1 gm and sheet 7 x 10 cm)  SURGEON: Theodoro Kos, DO  ASSISTANT: Shawn Rayburn, PA  ANESTHESIA:  General.   COMPLICATIONS: None.   INDICATIONS FOR PROCEDURE:  The patient, Vanessa Mason is a 81 y.o. female born on 08-Dec-1917, is here for treatment of a right leg chronic ulcer.  She had resection of a skin cancer and the path does not confirm negative margins to more will be obtained. MRN: [id number]  CONSENT:  Informed consent was obtained directly from the patient. Risks, benefits and alternatives were fully discussed. Specific risks including but not limited to bleeding, infection, hematoma, seroma, scarring, pain, infection, contracture, asymmetry, wound healing problems, and need for further surgery were all discussed. The patient did have an ample opportunity to have questions answered to satisfaction.   DESCRIPTION OF PROCEDURE:  The patient was taken to the operating room. SCDs were placed and IV antibiotics were given. The patient's operative site was prepped and draped in a sterile fashion. A time out was performed and all information was confirmed to be correct.  General anesthesia was administered.  The margin the inferior and superior wound were excised with 5 mm borders, marked with the short stitch superior and and the long stitch lateral and sent to pathology.  A culture was obtained from the depth of both wounds for gram stain culture and sensitivity.  The leg was irrigated with antibiotic solution.  Hemostasis was achieved with  electrocautery. The superior wound was underminded medially and laterally and closed with 4-0 Vicryl vertical mattress sutures and a running 5-0 Monocryl.  The inferior wound had all of the Acell powder and 5 x 7 cm of the sheet applied and secured with 5-0 Vicryl.  Adaptic was placed and KY gel.  The gauze, kerlex and Ace wrap were applied.  The patient tolerated the procedure well.  There were no complications. The patient was allowed to wake from anesthesia, extubated and taken to the recovery room in satisfactory condition.

## 2014-12-11 ENCOUNTER — Non-Acute Institutional Stay (SKILLED_NURSING_FACILITY): Payer: Medicare HMO | Admitting: Adult Health

## 2014-12-11 ENCOUNTER — Encounter (HOSPITAL_COMMUNITY): Payer: Self-pay | Admitting: Plastic Surgery

## 2014-12-11 DIAGNOSIS — L03119 Cellulitis of unspecified part of limb: Secondary | ICD-10-CM

## 2014-12-11 DIAGNOSIS — F329 Major depressive disorder, single episode, unspecified: Secondary | ICD-10-CM | POA: Diagnosis not present

## 2014-12-11 DIAGNOSIS — L02419 Cutaneous abscess of limb, unspecified: Secondary | ICD-10-CM

## 2014-12-11 DIAGNOSIS — E785 Hyperlipidemia, unspecified: Secondary | ICD-10-CM | POA: Diagnosis not present

## 2014-12-11 DIAGNOSIS — N3941 Urge incontinence: Secondary | ICD-10-CM

## 2014-12-11 DIAGNOSIS — K5901 Slow transit constipation: Secondary | ICD-10-CM | POA: Diagnosis not present

## 2014-12-11 DIAGNOSIS — K5909 Other constipation: Secondary | ICD-10-CM | POA: Insufficient documentation

## 2014-12-11 DIAGNOSIS — F32A Depression, unspecified: Secondary | ICD-10-CM

## 2014-12-11 DIAGNOSIS — I10 Essential (primary) hypertension: Secondary | ICD-10-CM | POA: Diagnosis not present

## 2014-12-11 DIAGNOSIS — K5900 Constipation, unspecified: Secondary | ICD-10-CM | POA: Insufficient documentation

## 2014-12-11 NOTE — Anesthesia Postprocedure Evaluation (Signed)
Anesthesia Post Note  Patient: Vanessa Mason  Procedure(s) Performed: Procedure(s) (LRB): IRRIGATION AND DEBRIDEMENT ON RIGHT LEG,  (Right) APPLICATION OF A-CELL OF EXTREMITY (Right)  Anesthesia type: General  Patient location: PACU  Post pain: Pain level controlled and Adequate analgesia  Post assessment: Post-op Vital signs reviewed, Patient's Cardiovascular Status Stable, Respiratory Function Stable, Patent Airway and Pain level controlled  Last Vitals:  Filed Vitals:   12/10/14 1730  BP:   Pulse: 86  Temp: 36.6 C  Resp: 14    Post vital signs: Reviewed and stable  Level of consciousness: awake, alert  and oriented  Complications: No apparent anesthesia complications

## 2014-12-11 NOTE — Progress Notes (Signed)
Patient ID: Vanessa Mason, female   DOB: 1917/10/13, 81 y.o.   MRN: [id number]   Location:  [facility]  PCP: Garret Reddish, MD  Code Status: DNR   Chief Complaint  Patient presents with  . Discharge Note    HPI: 81 y.o. 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  She underwent a Mohs procedure for this, then developed cellulitis of the right shin and dehiscence of the wound.  She was hospitalized from 7/1-7/6 for this infection.  She was treated with vanc 7/2-4, zosyn 7/2, and ancef from 7/2-7/12 (after culture grew out MSSA).  She is receiving vitamin C and zinc as per plastic surgery recommendations and has wet to dry dressings with santyl on the necrotic areas.  She had a wound debridement by Dr. Migdalia Dk with pig cell application on 5/46. A wound culture was taken at the time of debridement. The plan is for her to receive  Wound vac therapy next week.    She is requesting to go home. She has a midline line catheter in place that flushes but can not draw blood.  She reports very little pain and is using Tylenol for this by her accounts with relief. She has not had a fever or drainage from the wound.   Her daughter reports some urinary incontinence that is new. She has not had any back pain or dysuria.  ROS: Review of Systems  Constitutional: Negative for fever, chills and malaise/fatigue.  HENT: Negative for congestion.   Respiratory: Negative for cough and shortness of breath.   Cardiovascular: Negative for chest pain, palpitations and leg swelling.  Gastrointestinal: Negative for abdominal pain, diarrhea, constipation, blood in stool and melena.  Genitourinary: Positive for urgency. Negative for dysuria and frequency.       Incontinence  Musculoskeletal: Positive for joint pain. Negative for falls.  Skin:       Two wounds were mohs was done on right  shin  Neurological: Negative for dizziness, loss of consciousness, weakness and headaches.  Psychiatric/Behavioral: Negative for depression and memory loss.    Past Medical History  Diagnosis Date  . BACK PAIN 03/08/2007  . DEPRESSIVE DISORDER 12/01/2008  . HYPERLIPIDEMIA 03/09/2006  . HYPERTENSION 03/09/2006  . KNEE PAIN 05/06/2009  . GI bleed 09/12/2010    Naproxen induced Endoscopy with dr Harlene Ramus   . Urinary urgency   . HIATAL HERNIA 12/17/2006  . Hypothyroidism   . Pneumonia     57yrs. ago- hosp. pneumonia  . Shortness of breath dyspnea     walking fast- states she gets winded, empysema on last cxr.  . History of hiatal hernia   . Arthritis     "all over"  . Cancer     squamous cell on R leg & face- ?basal cell   Past Surgical History  Procedure Laterality Date  . Abdominal hysterectomy    . Oophorectomy    . Thyroidectomy    . Rotator cuff repair Right   . Laminectomy      with fusion  . Arthroscopic knee Right   . Tubal ligation    . Resection distal clavical    . Cardiac catheterization    . Back surgery  2000    spinal fusion  . Eye surgery      cataracts bilateral - removed  . Hernia repair Bilateral     inguinal   . [facility]  moses surgery on R lle  . I&d extremity Right 12/10/2014    Procedure: IRRIGATION AND DEBRIDEMENT ON RIGHT LEG, ;  Surgeon: Theodoro Kos, DO;  Location: [place];  Service: Plastics;  Laterality: Right;  . Application of a-cell of extremity Right 12/10/2014    Procedure: APPLICATION OF A-CELL OF EXTREMITY;  Surgeon: Theodoro Kos, DO;  Location: [place];  Service: Plastics;  Laterality: Right;   Social History:   reports that she has never smoked. She has never used smokeless tobacco. She reports that she does not use illicit drugs. Her alcohol history is not on file.  Family History  Problem Relation Age of Onset  . Cancer Mother 95  . Cancer Brother     Allergies  Allergen Reactions  . Naproxen     Gi bleed  . Nsaids     GI  BLEED  . Aspirin     GI bleed  . Ace Inhibitors     Cough     Medications: Patient's Medications  New Prescriptions   No medications on file  Previous Medications   ACETAMINOPHEN (TYLENOL) 650 MG CR TABLET    Take 1 tablet (650 mg total) by mouth as needed (for pain).   CALCIUM-MAGNESIUM 300-300 MG TABS    Take 2 tablets by mouth daily.   CHOLECALCIFEROL (VITAMIN D) 2000 UNITS CAPS    Take 2,000 Units by mouth daily.    CITALOPRAM (CELEXA) 10 MG TABLET    Take 1 tablet (10 mg total) by mouth daily.   CYCLOBENZAPRINE (FLEXERIL) 10 MG TABLET    Take 0.5 tablets (5 mg total) by mouth at bedtime.   KRILL OIL 1000 MG CAPS    Take 1,000 mg by mouth daily.    LOSARTAN (COZAAR) 50 MG TABLET    Take 1 tablet (50 mg total) by mouth daily.   LUTEIN 20 MG CAPS    Take 20 mg by mouth daily.    MULTIPLE VITAMIN (MULTIVITAMIN) TABLET    Take 1 tablet by mouth 2 (two) times daily.    MUPIROCIN OINTMENT (BACTROBAN) 2 %    Apply 1 application topically 2 (two) times daily as needed (skin infection).    OXYCODONE-ACETAMINOPHEN (PERCOCET/ROXICET) 5-325 MG PER TABLET    Take 0.5-1 tablets by mouth every 4 (four) hours as needed for moderate pain or severe pain.   PHOSPHATIDYLSERINE PO    Take 1,000 mg by mouth 2 (two) times daily.   POLYETHYLENE GLYCOL (MIRALAX / GLYCOLAX) PACKET    Take 17 g by mouth daily as needed for mild constipation.   PROBIOTIC PRODUCT (PROBIOTIC DAILY PO)    Take 1 tablet by mouth 2 (two) times daily.    TRIAMCINOLONE CREAM (KENALOG) 0.1 %    Apply 1 application topically daily as needed (skin irritation).    VITAMIN C (ASCORBIC ACID) 500 MG TABLET    Take 1 tablet (500 mg total) by mouth daily.   ZINC 30 MG TABS    Take 2 tablets (60 mg total) by mouth daily.  Modified Medications   No medications on file  Discontinued Medications   CEFAZOLIN SODIUM (ANCEF IV)    Inject 1 g into the vein every 8 (eight) hours.   COLLAGENASE (SANTYL) OINTMENT    Apply topically daily. Wet to dry  dressing changes with santyl to necrotic tissue     Physical Exam: Filed Vitals:   12/11/14 1109  BP: 139/76  Pulse: 88  Temp: 98 F (36.7 C)  Resp: 20  SpO2: 95%   There is no weight on file to calculate BMI. Physical Exam  Constitutional: She is oriented to person, place, and time. She appears well-developed and well-nourished. No distress.  HENT:  Head: Normocephalic and atraumatic.  Eyes: Conjunctivae and EOM are normal. Pupils are equal, round, and reactive to light.  Neck: Normal range of motion. Neck supple. No JVD present. No thyromegaly present.  Cardiovascular: Normal rate, regular rhythm, normal heart sounds and intact distal pulses.   No murmur heard. bppp +2, no edema  Pulmonary/Chest: Effort normal and breath sounds normal. No respiratory distress.  Abdominal: Soft. Bowel sounds are normal. She exhibits no distension. There is no tenderness.  Neurological: She is alert and oriented to person, place, and time.  Skin: Skin is warm and dry.  Right shin with two wounds present:  Superior wound without any surrounding erythema, has yellow granulation tissue on 100% of open area; inferior wound covered with mesh sutured in, no surrounding erythema or drainage  Psychiatric: She has a normal mood and affect.     Labs reviewed: Basic Metabolic Panel:  Recent Labs  11/30/14 0410 11/30/14 0754 12/01/14 0415 12/08/14 1540  NA 137  --  139 138  K 4.1  --  3.9 3.8  CL 98*  --  99* 102  CO2 33*  --  32 28  GLUCOSE 97  --  97 106*  BUN 20  --  15 21*  CREATININE 0.69 0.63 0.66 0.65  CALCIUM 8.9  --  9.1 9.3   Liver Function Tests:  Recent Labs  12/30/13 0958 11/12/14 1207  AST 23 23  ALT 15 13*  ALKPHOS 50 62  BILITOT 1.0 0.9  PROT 6.3 7.1  ALBUMIN 4.0 4.2   No results for input(s): LIPASE, AMYLASE in the last 8760 hours. No results for input(s): AMMONIA in the last 8760 hours. CBC:  Recent Labs  11/12/14 1207 11/27/14 1906 12/01/14 0415  12/08/14 1540  WBC 8.7 12.3* 7.4 9.5  NEUTROABS 5.5 9.5*  --   --   HGB 13.3 12.4 11.0* 11.6*  HCT 41.0 38.5 34.2* 35.5*  MCV 90.7 92.5 91.2 91.0  PLT 217 222 306 295   Cardiac Enzymes: No results for input(s): CKTOTAL, CKMB, CKMBINDEX, TROPONINI in the last 8760 hours. BNP: Invalid input(s): POCBNP  No results found for: HGBA1C Lab Results  Component Value Date   TSH 0.99 12/30/2013   No results found for: VITAMINB12 No results found for: FOLATE No results found for: IRON, TIBC, FERRITIN  Imaging and Procedures obtained prior to SNF admission: 10/28/14:  CT ankle left hip/ankle/knee w/o contrast:  Osteoarthritis of the knee with extensive chondrocalcinosis, loose bodies, and prominent Baker's cyst.Slight arthritis of the left hip with calcific tendinopathy and atrophy of the left gluteus medius and minimus muscles.  11/12/14:  CXR:  Emphysema and small hiatal hernia. No active cardiac present no acute cardiopulmonary disease.  Patient Care Team: Marin Olp, MD as PCP - General (Family Medicine)  Assessment/Plan  1. Cellulitis of leg, right -with MSSA in wound culture -completed Ancef on 7/12 and a wound culture was obtained on 7/14 -no signs of infection at present -s/p wound debridement by Dr. Migdalia Dk, with pig cell transplant -plan for wound vac next weekand f/u with Dr. Migdalia Dk in two weeks -continue ky jelly dressing to inferior leg wound and daily dressing changes, superior dressing change prn with xeroform dressing per plastic surgery -needs follow up with Dr. Mariea Clonts regarding cellulitis in the  clinic in one week (wound culture pending), Dr. Migdalia Dk has deferred antibiotic management to [facility]. The resident has decided to transfer to our group for primary care.  -may d/c picc line to prevent risk of line infection  2. Squamous cell carcinoma, leg, right -s/p MOHS procedure, then wounds dehisced and became infected  3. Hyperlipemia -f/u in clinic, currently on krill  oil  4. Slow transit constipation -needs to have a BM today or will need a laxative -continue miralax  5. Depression -continue celexa  6. Essential hypertension, benign -BP improved from last visit, continue current meds  7. Urinary incontinence -try bladder training and avoidance of irritants -f/u with Dr. Mariea Clonts  Functional status:  Is independent in ADLs,but will need home health for wound dressing changes, wound vac therapy, and picc line flushing  Family/ staff Communication: spoke with her daughter at the bedside today, as well as the Midwife and rehab nurse. She is ready for discharge in the morning but will need frequent follow up.  She will need f/u apt with Dr. Mariea Clonts and Dr. Migdalia Dk.   I spent >45 min on this discharge  Cindi Carbon, [place] [phone]

## 2014-12-13 LAB — WOUND CULTURE
Culture: NO GROWTH
Gram Stain: NONE SEEN

## 2014-12-14 LAB — TISSUE CULTURE
Culture: NO GROWTH
Gram Stain: NONE SEEN

## 2014-12-15 LAB — ANAEROBIC CULTURE
Gram Stain: NONE SEEN
Gram Stain: NONE SEEN
Gram Stain: NONE SEEN

## 2014-12-15 LAB — WOUND CULTURE: Gram Stain: NONE SEEN

## 2014-12-16 ENCOUNTER — Non-Acute Institutional Stay: Payer: Medicare HMO | Admitting: Internal Medicine

## 2014-12-16 ENCOUNTER — Encounter: Payer: Self-pay | Admitting: Internal Medicine

## 2014-12-16 VITALS — BP 110/62 | HR 80 | Temp 97.6°F | Wt 124.0 lb

## 2014-12-16 DIAGNOSIS — S81801D Unspecified open wound, right lower leg, subsequent encounter: Secondary | ICD-10-CM

## 2014-12-16 DIAGNOSIS — L02419 Cutaneous abscess of limb, unspecified: Secondary | ICD-10-CM | POA: Diagnosis not present

## 2014-12-16 DIAGNOSIS — L03119 Cellulitis of unspecified part of limb: Secondary | ICD-10-CM

## 2014-12-16 DIAGNOSIS — C44722 Squamous cell carcinoma of skin of right lower limb, including hip: Secondary | ICD-10-CM

## 2014-12-16 NOTE — Progress Notes (Signed)
Patient ID: Vanessa Mason, female   DOB: Oct 07, 1917, 81 y.o.   MRN: [id number]   Location:  [facility]  Code Status: DNR  Goals of Care:Advanced Directive information Does patient have an advance directive?: Yes, Type of Advance Directive: [place];Living will, Pre-existing out of facility DNR order (yellow form or pink MOST form): Yellow form placed in chart (order not valid for inpatient use), Does patient want to make changes to advanced directive?: No - Patient declined  Chief Complaint  Patient presents with  . Medical Management of Chronic Issues    cellulitis and abscess of right leg, Rehab follow up. Here with daughter [name]    HPI: Patient is a 81 y.o. white female seen in the [facility] today for medical mgt of her chronic diseases, but primarily to f/u on her rehab stay where she was treated for cellulitis and wound infection of her right leg.  She since had debridement and placement of pig cell by Dr Migdalia Dk on 12/10/14.  The culture results show no wbc, no squamous epi cells, no organisms, but then grew out in only 1/2 rare staph aureus.  Other wound culture, tissue culture, and anaerobic cx x 2 were negative.  Path report shows ulcer with abundant granulation tissue and reactive squamous hyperplasia from both the superior and inferior lesions of her right shin. Pt thinks she has an appt Friday, but records indicate next appt is on Monday.  She was here with her daughter.   Review of Systems:  Review of Systems  Constitutional: Positive for weight loss. Negative for fever, chills and malaise/fatigue.  HENT: Negative for hearing loss.   Eyes: Negative for blurred vision.  Respiratory: Negative for cough and shortness of breath.   Cardiovascular: Positive for leg swelling. Negative for chest pain.       Mild swelling of right vs. Left leg  Gastrointestinal: Negative for abdominal pain, constipation, blood in stool and melena.  Genitourinary:  Negative for dysuria, urgency and frequency.  Musculoskeletal: Positive for back pain and joint pain. Negative for falls.  Skin:       Right shin with two wounds  Neurological: Negative for dizziness, loss of consciousness, weakness and headaches.  Psychiatric/Behavioral: Negative for depression and memory loss.    Past Medical History  Diagnosis Date  . BACK PAIN 03/08/2007  . DEPRESSIVE DISORDER 12/01/2008  . HYPERLIPIDEMIA 03/09/2006  . HYPERTENSION 03/09/2006  . KNEE PAIN 05/06/2009  . GI bleed 09/12/2010    Naproxen induced Endoscopy with dr Harlene Ramus   . Urinary urgency   . HIATAL HERNIA 12/17/2006  . Hypothyroidism   . Pneumonia     55yrs. ago- hosp. pneumonia  . Shortness of breath dyspnea     walking fast- states she gets winded, empysema on last cxr.  . History of hiatal hernia   . Arthritis     "all over"  . Cancer     squamous cell on R leg & face- ?basal cell    Past Surgical History  Procedure Laterality Date  . Abdominal hysterectomy    . Oophorectomy    . Thyroidectomy    . Rotator cuff repair Right   . Laminectomy      with fusion  . Arthroscopic knee Right   . Tubal ligation    . Resection distal clavical    . Cardiac catheterization    . Back surgery  2000    spinal fusion  . Eye surgery  cataracts bilateral - removed  . Hernia repair Bilateral     inguinal   . [place]      moses surgery on R lle  . I&d extremity Right 12/10/2014    Procedure: IRRIGATION AND DEBRIDEMENT ON RIGHT LEG, ;  Surgeon: Theodoro Kos, DO;  Location: [place];  Service: Plastics;  Laterality: Right;  . Application of a-cell of extremity Right 12/10/2014    Procedure: APPLICATION OF A-CELL OF EXTREMITY;  Surgeon: Theodoro Kos, DO;  Location: [place];  Service: Plastics;  Laterality: Right;    Social History:   reports that she has never smoked. She has never used smokeless tobacco. She reports that she drinks alcohol. She reports that she does not use illicit drugs.  Allergies   Allergen Reactions  . Naproxen     Gi bleed  . Nsaids     GI BLEED  . Aspirin     GI bleed  . Ace Inhibitors     Cough     Medications: Patient's Medications  New Prescriptions   No medications on file  Previous Medications   ACETAMINOPHEN (TYLENOL) 650 MG CR TABLET    Take 1 tablet (650 mg total) by mouth as needed (for pain).   CALCIUM-MAGNESIUM 300-300 MG TABS    Take 2 tablets by mouth daily.   CHOLECALCIFEROL (VITAMIN D) 2000 UNITS CAPS    Take 2,000 Units by mouth daily.    CITALOPRAM (CELEXA) 10 MG TABLET    Take 1 tablet (10 mg total) by mouth daily.   CYCLOBENZAPRINE (FLEXERIL) 10 MG TABLET    Take 0.5 tablets (5 mg total) by mouth at bedtime.   KRILL OIL 1000 MG CAPS    Take 1,000 mg by mouth. Twice daily   LOSARTAN (COZAAR) 50 MG TABLET    Take 1 tablet (50 mg total) by mouth daily.   LUTEIN 20 MG CAPS    Take 20 mg by mouth daily.    MULTIPLE VITAMIN (MULTIVITAMIN) TABLET    Take 1 tablet by mouth 2 (two) times daily.    MUPIROCIN OINTMENT (BACTROBAN) 2 %    Apply 1 application topically 2 (two) times daily as needed (skin infection).    PHOSPHATIDYLSERINE PO    Take 1,000 mg by mouth 2 (two) times daily.   PROBIOTIC PRODUCT (PROBIOTIC DAILY PO)    Take 1 tablet by mouth 2 (two) times daily.    TRIAMCINOLONE CREAM (KENALOG) 0.1 %    Apply 1 application topically daily as needed (skin irritation).    VITAMIN C (ASCORBIC ACID) 500 MG TABLET    Take 1 tablet (500 mg total) by mouth daily.   ZINC 30 MG TABS    Take 2 tablets (60 mg total) by mouth daily.  Modified Medications   No medications on file  Discontinued Medications   OXYCODONE-ACETAMINOPHEN (PERCOCET/ROXICET) 5-325 MG PER TABLET    Take 0.5-1 tablets by mouth every 4 (four) hours as needed for moderate pain or severe pain.   POLYETHYLENE GLYCOL (MIRALAX / GLYCOLAX) PACKET    Take 17 g by mouth daily as needed for mild constipation.     Physical Exam: Filed Vitals:   12/16/14 1134  BP: 110/62  Pulse: 80   Temp: 97.6 F (36.4 C)  TempSrc: Oral  Weight: 124 lb (56.246 kg)  SpO2: 91%   Body mass index is 24.64 kg/(m^2). Physical Exam  Constitutional: She is oriented to person, place, and time. She appears well-developed and well-nourished. No distress.  Cardiovascular:  Normal rate, regular rhythm, normal heart sounds and intact distal pulses.   Pulmonary/Chest: Effort normal and breath sounds normal. No respiratory distress.  Abdominal: Soft. Bowel sounds are normal. She exhibits no distension. There is no tenderness.  Musculoskeletal: Normal range of motion.  Neurological: She is alert and oriented to person, place, and time.  Skin:  Right shin with two wounds:  Upper wound without any drainage or surrounding erythema, only slightly open at this time; bottom wound with round dressing sutured in place, no surrounding erythema or drainage when nurse was changing dressings; mild warmth of right leg vs. Left  Psychiatric: She has a normal mood and affect.     Labs reviewed: Basic Metabolic Panel:  Recent Labs  12/30/13 0958  11/30/14 0410 11/30/14 0754 12/01/14 0415 12/08/14 1540  NA 140  < > 137  --  139 138  K 4.3  < > 4.1  --  3.9 3.8  CL 103  < > 98*  --  99* 102  CO2 30  < > 33*  --  32 28  GLUCOSE 88  < > 97  --  97 106*  BUN 19  < > 20  --  15 21*  CREATININE 0.7  < > 0.69 0.63 0.66 0.65  CALCIUM 9.2  < > 8.9  --  9.1 9.3  TSH 0.99  --   --   --   --   --   < > = values in this interval not displayed. Liver Function Tests:  Recent Labs  12/30/13 0958 11/12/14 1207  AST 23 23  ALT 15 13*  ALKPHOS 50 62  BILITOT 1.0 0.9  PROT 6.3 7.1  ALBUMIN 4.0 4.2   No results for input(s): LIPASE, AMYLASE in the last 8760 hours. No results for input(s): AMMONIA in the last 8760 hours. CBC:  Recent Labs  11/12/14 1207 11/27/14 1906 12/01/14 0415 12/08/14 1540  WBC 8.7 12.3* 7.4 9.5  NEUTROABS 5.5 9.5*  --   --   HGB 13.3 12.4 11.0* 11.6*  HCT 41.0 38.5 34.2* 35.5*   MCV 90.7 92.5 91.2 91.0  PLT 217 222 306 295   Lipid Panel:  Recent Labs  12/30/13 0958  CHOL 247*  HDL 59.00  LDLCALC 163*  TRIG 125.0  CHOLHDL 4   Procedures since last appt: Reviewed Dr. Leafy Ro operative report, cultures.    Patient Care Team: Gayland Curry, DO as PCP - General (Geriatric Medicine)  Assessment/Plan 1. Cellulitis and abscess of leg -completed course of ancef on 12/08/14 -underwent debridement of both wounds and placement of pig cell on distal wound -culture results intraoperatively showed 1/2 positive rare staph aureus without wbc to suggest infection--pt also has no signs of ongoing infection so I suspect that this is due to normal skin flora -I have opted not to restart any abx at present -she is high risk for c diff due to her age, living environment, recent hospitalization and recent prolonged abx course---she is taking probiotics  2. Wound of right leg, subsequent encounter -see above -keep f/u with wound care center as scheduled for next step in therapy  3. Squamous cell carcinoma, leg, right -s/p Mohs on right skin -biopsy during debridement of wound was negative for squamous cell ca also and borders from dermatology were also negative per pt report  40 mins were spent with the patient and her daughter examining the wound and discussing her general condition, answering questions   Labs/tests ordered: no new  at present Next appt:  6 wks med mgt, but she needs an official establishing appt with records from her prior PCP   Zamirah Denny L. Sarenity Ramaker, D.O. [facility] [address], [place] [address] Cell Phone (Mon-Fri 8am-5pm):  [phone] On Call:  [phone] & follow prompts after 5pm & weekends Office Phone:  [phone] Office Fax:  [phone]

## 2014-12-18 DIAGNOSIS — S81801A Unspecified open wound, right lower leg, initial encounter: Secondary | ICD-10-CM | POA: Insufficient documentation

## 2014-12-21 ENCOUNTER — Encounter (HOSPITAL_BASED_OUTPATIENT_CLINIC_OR_DEPARTMENT_OTHER): Payer: Medicare HMO | Attending: Plastic Surgery

## 2014-12-21 DIAGNOSIS — M1990 Unspecified osteoarthritis, unspecified site: Secondary | ICD-10-CM | POA: Insufficient documentation

## 2014-12-21 DIAGNOSIS — X58XXXA Exposure to other specified factors, initial encounter: Secondary | ICD-10-CM | POA: Diagnosis not present

## 2014-12-21 DIAGNOSIS — S81801A Unspecified open wound, right lower leg, initial encounter: Secondary | ICD-10-CM | POA: Diagnosis not present

## 2014-12-22 ENCOUNTER — Other Ambulatory Visit: Payer: Self-pay | Admitting: *Deleted

## 2014-12-22 DIAGNOSIS — I10 Essential (primary) hypertension: Secondary | ICD-10-CM

## 2014-12-22 MED ORDER — LOSARTAN POTASSIUM 50 MG PO TABS: ORAL_TABLET | ORAL | Status: DC

## 2014-12-22 NOTE — Telephone Encounter (Signed)
Patient called and requested refill to be faxed to pharmacy. Faxed.

## 2014-12-23 ENCOUNTER — Telehealth: Payer: Self-pay

## 2014-12-23 NOTE — Telephone Encounter (Signed)
Patient said she hasn't completed any forms for our office. Gave the New Patient forms to [facility] to give to patient Friday when she comes in . Has appt 01/27/15 to see Dr. Mariea Clonts for follow-up at 1:30

## 2015-01-04 ENCOUNTER — Encounter (HOSPITAL_BASED_OUTPATIENT_CLINIC_OR_DEPARTMENT_OTHER): Payer: Medicare HMO | Attending: Plastic Surgery

## 2015-01-04 DIAGNOSIS — S81801A Unspecified open wound, right lower leg, initial encounter: Secondary | ICD-10-CM | POA: Diagnosis not present

## 2015-01-04 DIAGNOSIS — C44622 Squamous cell carcinoma of skin of right upper limb, including shoulder: Secondary | ICD-10-CM | POA: Insufficient documentation

## 2015-01-04 DIAGNOSIS — I10 Essential (primary) hypertension: Secondary | ICD-10-CM | POA: Insufficient documentation

## 2015-01-04 DIAGNOSIS — M1990 Unspecified osteoarthritis, unspecified site: Secondary | ICD-10-CM | POA: Diagnosis not present

## 2015-01-04 DIAGNOSIS — X58XXXA Exposure to other specified factors, initial encounter: Secondary | ICD-10-CM | POA: Diagnosis not present

## 2015-01-19 ENCOUNTER — Encounter (HOSPITAL_COMMUNITY): Payer: Self-pay | Admitting: *Deleted

## 2015-01-19 ENCOUNTER — Other Ambulatory Visit: Payer: Self-pay | Admitting: Plastic Surgery

## 2015-01-19 DIAGNOSIS — L97912 Non-pressure chronic ulcer of unspecified part of right lower leg with fat layer exposed: Secondary | ICD-10-CM

## 2015-01-19 MED ORDER — CHLORHEXIDINE GLUCONATE 4 % EX LIQD: 60.0000 mL | Freq: Once | CUTANEOUS | Status: DC

## 2015-01-19 MED ORDER — BUPIVACAINE LIPOSOME 1.3 % IJ SUSP: 20.0000 mL | Freq: Once | INTRAMUSCULAR | Status: DC

## 2015-01-19 MED ORDER — CEFAZOLIN SODIUM-DEXTROSE 2-3 GM-% IV SOLR: 2.0000 g | INTRAVENOUS | Status: DC

## 2015-01-19 MED ORDER — TRANEXAMIC ACID 1000 MG/10ML IV SOLN: 1000.0000 mg | INTRAVENOUS | Status: DC

## 2015-01-20 ENCOUNTER — Ambulatory Visit (HOSPITAL_COMMUNITY): Payer: Medicare HMO | Admitting: Anesthesiology

## 2015-01-20 ENCOUNTER — Encounter (HOSPITAL_COMMUNITY)
Admission: RE | Disposition: A | Discharge status: Home / Self Care | Payer: Self-pay | Source: Ambulatory Visit | Attending: Plastic Surgery

## 2015-01-20 ENCOUNTER — Ambulatory Visit (HOSPITAL_COMMUNITY)
Admission: RE | Admit: 2015-01-20 | Discharge: 2015-01-20 | Disposition: A | Discharge status: Home / Self Care | Payer: Medicare HMO | Source: Ambulatory Visit | Attending: Plastic Surgery | Admitting: Plastic Surgery

## 2015-01-20 ENCOUNTER — Encounter (HOSPITAL_COMMUNITY): Payer: Self-pay | Admitting: Plastic Surgery

## 2015-01-20 DIAGNOSIS — I10 Essential (primary) hypertension: Secondary | ICD-10-CM | POA: Diagnosis not present

## 2015-01-20 DIAGNOSIS — Z85828 Personal history of other malignant neoplasm of skin: Secondary | ICD-10-CM | POA: Diagnosis not present

## 2015-01-20 DIAGNOSIS — M1990 Unspecified osteoarthritis, unspecified site: Secondary | ICD-10-CM | POA: Insufficient documentation

## 2015-01-20 DIAGNOSIS — Z8711 Personal history of peptic ulcer disease: Secondary | ICD-10-CM | POA: Diagnosis not present

## 2015-01-20 DIAGNOSIS — Y838 Other surgical procedures as the cause of abnormal reaction of the patient, or of later complication, without mention of misadventure at the time of the procedure: Secondary | ICD-10-CM | POA: Diagnosis not present

## 2015-01-20 DIAGNOSIS — F329 Major depressive disorder, single episode, unspecified: Secondary | ICD-10-CM | POA: Diagnosis not present

## 2015-01-20 DIAGNOSIS — E785 Hyperlipidemia, unspecified: Secondary | ICD-10-CM | POA: Insufficient documentation

## 2015-01-20 DIAGNOSIS — L97912 Non-pressure chronic ulcer of unspecified part of right lower leg with fat layer exposed: Secondary | ICD-10-CM

## 2015-01-20 DIAGNOSIS — Z87891 Personal history of nicotine dependence: Secondary | ICD-10-CM | POA: Diagnosis not present

## 2015-01-20 DIAGNOSIS — T8189XA Other complications of procedures, not elsewhere classified, initial encounter: Secondary | ICD-10-CM | POA: Insufficient documentation

## 2015-01-20 DIAGNOSIS — F419 Anxiety disorder, unspecified: Secondary | ICD-10-CM | POA: Insufficient documentation

## 2015-01-20 HISTORY — DX: Duodenal ulcer, unspecified as acute or chronic, without hemorrhage or perforation: K26.9

## 2015-01-20 HISTORY — PX: I & D EXTREMITY: SHX5045

## 2015-01-20 HISTORY — PX: I&D EXTREMITY: SHX5045

## 2015-01-20 LAB — CBC
HCT: 34.5 % — ABNORMAL LOW (ref 36.0–46.0)
Hemoglobin: 11.6 g/dL — ABNORMAL LOW (ref 12.0–15.0)
MCH: 30.1 pg (ref 26.0–34.0)
MCHC: 33.6 g/dL (ref 30.0–36.0)
MCV: 89.4 fL (ref 78.0–100.0)
Platelets: 192 10*3/uL (ref 150–400)
RBC: 3.86 MIL/uL — ABNORMAL LOW (ref 3.87–5.11)
RDW: 14.8 % (ref 11.5–15.5)
WBC: 5.9 10*3/uL (ref 4.0–10.5)

## 2015-01-20 LAB — BASIC METABOLIC PANEL
Anion gap: 8 (ref 5–15)
BUN: 23 mg/dL — ABNORMAL HIGH (ref 6–20)
CO2: 28 mmol/L (ref 22–32)
Calcium: 9.1 mg/dL (ref 8.9–10.3)
Chloride: 103 mmol/L (ref 101–111)
Creatinine, Ser: 0.61 mg/dL (ref 0.44–1.00)
GFR calc Af Amer: >60 mL/min (ref >60)
GFR calc non Af Amer: >60 mL/min (ref >60)
Glucose, Bld: 92 mg/dL (ref 65–99)
Potassium: 4 mmol/L (ref 3.5–5.1)
Sodium: 139 mmol/L (ref 135–145)

## 2015-01-20 SURGERY — IRRIGATION AND DEBRIDEMENT EXTREMITY
Anesthesia: General | Site: Leg Lower | Laterality: Right

## 2015-01-20 MED ORDER — ONDANSETRON HCL 4 MG/2ML IJ SOLN: 4.0000 mg | Freq: Once | INTRAMUSCULAR | Status: DC | PRN

## 2015-01-20 MED ORDER — SODIUM CHLORIDE 0.9 % IR SOLN
Status: DC | PRN
  Administered 2015-01-20: 1000 mL

## 2015-01-20 MED ORDER — ONDANSETRON HCL 4 MG/2ML IJ SOLN
INTRAMUSCULAR | Status: AC
  Filled 2015-01-20: qty 2

## 2015-01-20 MED ORDER — FENTANYL CITRATE (PF) 100 MCG/2ML IJ SOLN: 25.0000 ug | INTRAMUSCULAR | Status: DC | PRN

## 2015-01-20 MED ORDER — BUPIVACAINE-EPINEPHRINE 0.25% -1:200000 IJ SOLN
INTRAMUSCULAR | Status: DC | PRN
  Administered 2015-01-20: 30 mL

## 2015-01-20 MED ORDER — ONDANSETRON HCL 4 MG/2ML IJ SOLN
INTRAMUSCULAR | Status: DC | PRN
  Administered 2015-01-20: 4 mg via INTRAVENOUS

## 2015-01-20 MED ORDER — SODIUM CHLORIDE 0.9 % IR SOLN
Status: DC | PRN
  Administered 2015-01-20: 500 mL

## 2015-01-20 MED ORDER — CEFAZOLIN SODIUM-DEXTROSE 2-3 GM-% IV SOLR
2.0000 g | INTRAVENOUS | Status: AC
  Administered 2015-01-20: 2 g via INTRAVENOUS
  Filled 2015-01-20: qty 50

## 2015-01-20 MED ORDER — BUPIVACAINE-EPINEPHRINE (PF) 0.25% -1:200000 IJ SOLN
INTRAMUSCULAR | Status: AC
  Filled 2015-01-20: qty 30

## 2015-01-20 MED ORDER — PROPOFOL 10 MG/ML IV BOLUS
INTRAVENOUS | Status: AC
  Filled 2015-01-20: qty 20

## 2015-01-20 MED ORDER — LACTATED RINGERS IV SOLN
INTRAVENOUS | Status: DC
  Administered 2015-01-20 (×2): via INTRAVENOUS

## 2015-01-20 MED ORDER — LIDOCAINE HCL (CARDIAC) 20 MG/ML IV SOLN
INTRAVENOUS | Status: DC | PRN
  Administered 2015-01-20: 60 mg via INTRAVENOUS

## 2015-01-20 MED ORDER — PROPOFOL 10 MG/ML IV BOLUS
INTRAVENOUS | Status: DC | PRN
  Administered 2015-01-20: 140 mg via INTRAVENOUS

## 2015-01-20 MED ORDER — LIDOCAINE HCL (CARDIAC) 20 MG/ML IV SOLN
INTRAVENOUS | Status: AC
  Filled 2015-01-20: qty 5

## 2015-01-20 MED ORDER — EPHEDRINE SULFATE 50 MG/ML IJ SOLN
INTRAMUSCULAR | Status: DC | PRN
  Administered 2015-01-20: 10 mg via INTRAVENOUS
  Administered 2015-01-20: 5 mg via INTRAVENOUS
  Administered 2015-01-20: 15 mg via INTRAVENOUS
  Administered 2015-01-20: 10 mg via INTRAVENOUS

## 2015-01-20 MED ORDER — FENTANYL CITRATE (PF) 250 MCG/5ML IJ SOLN
INTRAMUSCULAR | Status: AC
  Filled 2015-01-20: qty 5

## 2015-01-20 MED ORDER — FENTANYL CITRATE (PF) 100 MCG/2ML IJ SOLN
INTRAMUSCULAR | Status: DC | PRN
  Administered 2015-01-20 (×2): 25 ug via INTRAVENOUS

## 2015-01-20 SURGICAL SUPPLY — 42 items
BANDAGE ELASTIC 4 VELCRO ST LF (GAUZE/BANDAGES/DRESSINGS) ×2 IMPLANT
BLADE DERMATOME SS (BLADE) ×1 IMPLANT
BLADE SURG ROTATE 9660 (MISCELLANEOUS) IMPLANT
BNDG GAUZE ELAST 4 BULKY (GAUZE/BANDAGES/DRESSINGS) ×1 IMPLANT
CANISTER SUCTION 2500CC (MISCELLANEOUS) ×2 IMPLANT
CARRIER SKIN GRAFT 8IN (CARRIER) ×1 IMPLANT
CHLORAPREP W/TINT 26ML (MISCELLANEOUS) IMPLANT
COVER SURGICAL LIGHT HANDLE (MISCELLANEOUS) ×2 IMPLANT
DRAPE EXTREMITY T 121X128X90 (DRAPE) IMPLANT
DRAPE INCISE IOBAN 66X45 STRL (DRAPES) ×1 IMPLANT
DRAPE ORTHO SPLIT 77X108 STRL (DRAPES)
DRAPE SURG ORHT 6 SPLT 77X108 (DRAPES) IMPLANT
DRSG ADAPTIC 3X8 NADH LF (GAUZE/BANDAGES/DRESSINGS) ×1 IMPLANT
DRSG PAD ABDOMINAL 8X10 ST (GAUZE/BANDAGES/DRESSINGS) IMPLANT
DRSG VAC ATS LRG SENSATRAC (GAUZE/BANDAGES/DRESSINGS) IMPLANT
DRSG VAC ATS MED SENSATRAC (GAUZE/BANDAGES/DRESSINGS) IMPLANT
DRSG VAC ATS SM SENSATRAC (GAUZE/BANDAGES/DRESSINGS) ×1 IMPLANT
ELECT REM PT RETURN 9FT ADLT (ELECTROSURGICAL) ×2
ELECTRODE REM PT RTRN 9FT ADLT (ELECTROSURGICAL) ×1 IMPLANT
GAUZE SPONGE 4X4 12PLY STRL (GAUZE/BANDAGES/DRESSINGS) IMPLANT
GLOVE BIO SURGEON STRL SZ 6.5 (GLOVE) ×4 IMPLANT
GOWN STRL REUS W/ TWL LRG LVL3 (GOWN DISPOSABLE) ×2 IMPLANT
GOWN STRL REUS W/TWL LRG LVL3 (GOWN DISPOSABLE) ×4
HANDPIECE INTERPULSE COAX TIP (DISPOSABLE)
KIT BASIN OR (CUSTOM PROCEDURE TRAY) ×2 IMPLANT
KIT ROOM TURNOVER OR (KITS) ×2 IMPLANT
MATRIX SURGICAL PSM 7X10CM (Tissue) ×1 IMPLANT
NEEDLE HYPO 25GX1X1/2 BEV (NEEDLE) ×2 IMPLANT
NS IRRIG 1000ML POUR BTL (IV SOLUTION) ×2 IMPLANT
PACK GENERAL/GYN (CUSTOM PROCEDURE TRAY) ×2 IMPLANT
PAD ABD 8X10 STRL (GAUZE/BANDAGES/DRESSINGS) IMPLANT
PAD ARMBOARD 7.5X6 YLW CONV (MISCELLANEOUS) ×4 IMPLANT
PAD NEG PRESSURE SENSATRAC (MISCELLANEOUS) IMPLANT
SET HNDPC FAN SPRY TIP SCT (DISPOSABLE) IMPLANT
SUT SILK 4 0 PS 2 (SUTURE) ×1 IMPLANT
SUT VIC AB 5-0 P-3 18XBRD (SUTURE) IMPLANT
SUT VIC AB 5-0 P3 18 (SUTURE) ×4
SYR CONTROL 10ML LL (SYRINGE) ×1 IMPLANT
TOWEL OR 17X24 6PK STRL BLUE (TOWEL DISPOSABLE) IMPLANT
TOWEL OR 17X26 10 PK STRL BLUE (TOWEL DISPOSABLE) ×2 IMPLANT
UNDERPAD 30X30 INCONTINENT (UNDERPADS AND DIAPERS) ×2 IMPLANT
WATER STERILE IRR 1000ML POUR (IV SOLUTION) IMPLANT

## 2015-01-20 NOTE — Op Note (Signed)
Operative Note   DATE OF OPERATION: 01/20/2015  LOCATION: [facility]  SURGICAL DIVISION: Plastic Surgery  PREOPERATIVE DIAGNOSES:  Right leg wound  POSTOPERATIVE DIAGNOSES:  same  PROCEDURE:  Split thickness skin graft placement to right leg wound after debridement of soft tissue and skin 3 x 6 cm and VAC placement, Acell (7 x 10) placement to donor site.  SURGEON: Theodoro Kos, DO  ASSISTANT: Shawn Rayburn, PA  ANESTHESIA:  General.   COMPLICATIONS: None.   INDICATIONS FOR PROCEDURE:  The patient, Vanessa Mason is a 81 y.o. female born on 1917/10/02, is here for treatment of a right leg ulcer. MRN: [id number]  CONSENT:  Informed consent was obtained directly from the patient. Risks, benefits and alternatives were fully discussed. Specific risks including but not limited to bleeding, infection, hematoma, seroma, scarring, pain, infection, contracture, asymmetry, wound healing problems, and need for further surgery were all discussed. The patient did have an ample opportunity to have questions answered to satisfaction.   DESCRIPTION OF PROCEDURE:  The patient was taken to the operating room. SCDs were placed and IV antibiotics were given. The patient's operative site was prepped and draped in a sterile fashion. A time out was performed and all information was confirmed to be correct.  General anesthesia was administered.  The area was debrided and irrigated with antibiotic solution.  A #10 blade was used to debride the skin around the area and hypergranulated soft tissue.  Hemostasis was achieved with electrocautery.  The donor skin was obtained from the right upper thigh with a 3 inch guard utilized at 04/999 inch.  The donor site was dressed with Acell 4 x 6 cm area.  The adaptic was placed with KY gel and an ABD.  The graft was placed on the lower right leg and secured with 5-0 Vicryl.  Adaptic was applied with the VAC.  There was an excellent seal.  The patient  tolerated the procedure well.  There were no complications. The patient was allowed to wake from anesthesia, extubated and taken to the recovery room in satisfactory condition.

## 2015-01-20 NOTE — Brief Op Note (Signed)
01/20/2015  1:34 PM  PATIENT:  [place] y.o. female  PRE-OPERATIVE DIAGNOSIS:  RIGHT LOWER LEG WOUND  POST-OPERATIVE DIAGNOSIS:  right lower leg wound  PROCEDURE:  Procedure(s): IRRIGATION AND DEBRIDEMENT RIGHT LOWER LEG WOUND, with ACELL to Donor Site, SKIN GRAFT AND VAC Placement (Right)  SURGEON:  Surgeon(s) and Role:    * Claire Sanger, DO - Primary  PHYSICIAN ASSISTANT: Shawn Rayburn, PA  ASSISTANTS: none   ANESTHESIA:   local and general  EBL:     BLOOD ADMINISTERED:none  DRAINS: none   LOCAL MEDICATIONS USED:  MARCAINE     SPECIMEN:  No Specimen  DISPOSITION OF SPECIMEN:  N/A  COUNTS:  YES  TOURNIQUET:  * No tourniquets in log *  DICTATION: .Dragon Dictation  PLAN OF CARE: Discharge to home after PACU  PATIENT DISPOSITION:  PACU - hemodynamically stable.   Delay start of Pharmacological VTE agent (>24hrs) due to surgical blood loss or risk of bleeding: no

## 2015-01-20 NOTE — Discharge Instructions (Signed)
Keep VAC in place and do not remove or disconnect.

## 2015-01-20 NOTE — H&P (Signed)
Vanessa Mason is an 81 y.o. female.   Chief Complaint: right leg wound HPI: The patient is a 81 yo female here for further care of her right lower leg wound.  She underwent debridement with Acell placement in the OR with much improvement. She had undergo excision of 2 areas of squamous cell cancer in dermatology office and the wounds were closed, but she developed cellulitis and had to be admitted to the hospital for IV antibiotics for MSSA infection . The wounds had opened and Dr. Migdalia Dk saw the patient and she was taken to the OR for debridement with path/cx and Acell placement. The path showed negative margins and the culture showed MSSA, although tissue cultures were no growth.  The upper incision is now closed.   Past Medical History  Diagnosis Date  . BACK PAIN 03/08/2007  . DEPRESSIVE DISORDER 12/01/2008  . HYPERLIPIDEMIA 03/09/2006  . HYPERTENSION 03/09/2006  . KNEE PAIN 05/06/2009  . GI bleed 09/12/2010    Naproxen induced Endoscopy with dr Harlene Ramus   . Urinary urgency   . HIATAL HERNIA 12/17/2006  . History of hiatal hernia   . Arthritis     "all over"  . Pneumonia     1yrs. ago- hosp. pneumonia  . Cancer     squamous cell on R leg & face- ?basal cell  . Duodenal ulcer     Past Surgical History  Procedure Laterality Date  . Abdominal hysterectomy    . Oophorectomy    . Thyroidectomy    . Rotator cuff repair Right     x2  . Arthroscopic knee Right     x 2  . Tubal ligation    . Resection distal clavical    . Cardiac catheterization    . Back surgery  2000    spinal fusion  . Eye surgery      cataracts bilateral - removed  . Hernia repair Bilateral     inguinal   . [place]      moses surgery on R lle  . I&d extremity Right 12/10/2014    Procedure: IRRIGATION AND DEBRIDEMENT ON RIGHT LEG, ;  Surgeon: Theodoro Kos, DO;  Location: [place];  Service: Plastics;  Laterality: Right;  . Application of a-cell of extremity Right 12/10/2014    Procedure: APPLICATION OF  A-CELL OF EXTREMITY;  Surgeon: Theodoro Kos, DO;  Location: [place] [facility];  Service: Plastics;  Laterality: Right;    Family History  Problem Relation Age of Onset  . Cancer Mother 13  . Cancer Brother    Social History:  reports that she has quit smoking. She has never used smokeless tobacco. She reports that she drinks about 4.2 oz of alcohol per week. She reports that she does not use illicit drugs.  Allergies:  Allergies  Allergen Reactions  . Naproxen     Gi bleed  . Nsaids     GI BLEED  . Aspirin     GI bleed  . Ace Inhibitors     Cough     No prescriptions prior to admission    No results found for this or any previous visit (from the past 48 hour(s)). No results found.  Review of Systems  Constitutional: Negative.   HENT: Negative.   Eyes: Negative.   Respiratory: Negative.   Cardiovascular: Negative.   Gastrointestinal: Negative.   Genitourinary: Negative.   Musculoskeletal: Negative.   Skin: Negative.   Neurological: Negative.   Psychiatric/Behavioral: Negative.     There were  no vitals taken for this visit. Physical Exam  Constitutional: She is oriented to person, place, and time. She appears well-developed and well-nourished.  HENT:  Head: Normocephalic and atraumatic.  Eyes: Conjunctivae and EOM are normal. Pupils are equal, round, and reactive to light.  Cardiovascular: Normal rate.   GI: Soft.  Neurological: She is alert and oriented to person, place, and time.  Psychiatric: She has a normal mood and affect. Her behavior is normal. Judgment and thought content normal.     Assessment/Plan Debridement of right leg wound with possible Acell or skin graft placement.  [place] 01/20/2015, 7:33 AM

## 2015-01-20 NOTE — Anesthesia Preprocedure Evaluation (Addendum)
Anesthesia Evaluation  Patient identified by MRN, date of birth, ID band Patient awake    Reviewed: Allergy & Precautions, NPO status , Patient's Chart, lab work & pertinent test results  History of Anesthesia Complications Negative for: history of anesthetic complications  Airway Mallampati: II  TM Distance: >3 FB Neck ROM: Full    Dental no notable dental hx. (+) Dental Advisory Given   Pulmonary former smoker,  breath sounds clear to auscultation  Pulmonary exam normal       Cardiovascular hypertension, Pt. on medications Normal cardiovascular exam+ dysrhythmias (LBBB) Rhythm:Regular Rate:Normal  left bundle branch block. He had a low risk myocardial perfusion study on 09/25/13   Neuro/Psych PSYCHIATRIC DISORDERS Anxiety Depression negative neurological ROS     GI/Hepatic Neg liver ROS, PUD,   Endo/Other  negative endocrine ROS  Renal/GU negative Renal ROS  negative genitourinary   Musculoskeletal  (+) Arthritis -,   Abdominal   Peds negative pediatric ROS (+)  Hematology negative hematology ROS (+)   Anesthesia Other Findings   Reproductive/Obstetrics negative OB ROS                            Anesthesia Physical Anesthesia Plan  ASA: II  Anesthesia Plan: General   Post-op Pain Management:    Induction: Intravenous  Airway Management Planned: LMA  Additional Equipment:   Intra-op Plan:   Post-operative Plan: Extubation in OR  Informed Consent: I have reviewed the patients History and Physical, chart, labs and discussed the procedure including the risks, benefits and alternatives for the proposed anesthesia with the patient or authorized representative who has indicated his/her understanding and acceptance.   Dental advisory given  Plan Discussed with: CRNA  Anesthesia Plan Comments:         Anesthesia Quick Evaluation

## 2015-01-20 NOTE — Anesthesia Postprocedure Evaluation (Signed)
  Anesthesia Post-op Note  Patient: Vanessa Mason  Procedure(s) Performed: Procedure(s) (LRB): IRRIGATION AND DEBRIDEMENT RIGHT LOWER LEG WOUND, with ACELL to Donor Site, SKIN GRAFT AND VAC Placement (Right)  Patient Location: PACU  Anesthesia Type: General  Level of Consciousness: awake and alert   Airway and Oxygen Therapy: Patient Spontanous Breathing  Post-op Pain: mild  Post-op Assessment: Post-op Vital signs reviewed, Patient's Cardiovascular Status Stable, Respiratory Function Stable, Patent Airway and No signs of Nausea or vomiting  Last Vitals:  Filed Vitals:   01/20/15 1415  BP: 168/89  Pulse: 95  Temp: 36.7 C  Resp: 19    Post-op Vital Signs: stable   Complications: No apparent anesthesia complications

## 2015-01-20 NOTE — Transfer of Care (Signed)
Immediate Anesthesia Transfer of Care Note  Patient: Vanessa Mason  Procedure(s) Performed: Procedure(s): IRRIGATION AND DEBRIDEMENT RIGHT LOWER LEG WOUND, with ACELL to Donor Site, SKIN GRAFT AND VAC Placement (Right)  Patient Location: PACU  Anesthesia Type:General  Level of Consciousness: awake, alert , oriented and patient cooperative  Airway & Oxygen Therapy: Patient Spontanous Breathing  Post-op Assessment: Report given to RN and Post -op Vital signs reviewed and stable  Post vital signs: Reviewed and stable  Last Vitals:  Filed Vitals:   01/20/15 1025  BP: 166/71  Pulse: 88  Temp: 36.6 C  Resp: 18    Complications: No apparent anesthesia complications

## 2015-01-21 ENCOUNTER — Encounter (HOSPITAL_COMMUNITY): Payer: Self-pay | Admitting: Plastic Surgery

## 2015-01-27 ENCOUNTER — Encounter: Payer: Self-pay | Admitting: Internal Medicine

## 2015-01-27 ENCOUNTER — Non-Acute Institutional Stay: Payer: Medicare HMO | Admitting: Internal Medicine

## 2015-01-27 VITALS — BP 118/64 | HR 84 | Temp 97.9°F | Wt 126.0 lb

## 2015-01-27 DIAGNOSIS — L97919 Non-pressure chronic ulcer of unspecified part of right lower leg with unspecified severity: Secondary | ICD-10-CM | POA: Diagnosis not present

## 2015-01-27 DIAGNOSIS — L02419 Cutaneous abscess of limb, unspecified: Secondary | ICD-10-CM

## 2015-01-27 DIAGNOSIS — R1012 Left upper quadrant pain: Secondary | ICD-10-CM | POA: Diagnosis not present

## 2015-01-27 DIAGNOSIS — Z66 Do not resuscitate: Secondary | ICD-10-CM | POA: Diagnosis not present

## 2015-01-27 DIAGNOSIS — I10 Essential (primary) hypertension: Secondary | ICD-10-CM

## 2015-01-27 DIAGNOSIS — L03119 Cellulitis of unspecified part of limb: Secondary | ICD-10-CM | POA: Diagnosis not present

## 2015-01-27 DIAGNOSIS — T814XXD Infection following a procedure, subsequent encounter: Secondary | ICD-10-CM

## 2015-01-27 DIAGNOSIS — Z945 Skin transplant status: Secondary | ICD-10-CM | POA: Insufficient documentation

## 2015-01-27 DIAGNOSIS — F329 Major depressive disorder, single episode, unspecified: Secondary | ICD-10-CM | POA: Diagnosis not present

## 2015-01-27 DIAGNOSIS — T8131XA Disruption of external operation (surgical) wound, not elsewhere classified, initial encounter: Secondary | ICD-10-CM

## 2015-01-27 DIAGNOSIS — IMO0001 Reserved for inherently not codable concepts without codable children: Secondary | ICD-10-CM

## 2015-01-27 DIAGNOSIS — Z8619 Personal history of other infectious and parasitic diseases: Secondary | ICD-10-CM

## 2015-01-27 DIAGNOSIS — E785 Hyperlipidemia, unspecified: Secondary | ICD-10-CM | POA: Diagnosis not present

## 2015-01-27 DIAGNOSIS — F32A Depression, unspecified: Secondary | ICD-10-CM

## 2015-01-27 NOTE — Progress Notes (Signed)
Location: [facility]  Code Status:  Goals of Care: Advanced Directive information Type of Advance Directive: [place];Living will;Out of facility DNR (pink MOST or yellow form), Pre-existing out of facility DNR order (yellow form or pink MOST form): Yellow form placed in chart (order not valid for inpatient use)   Chief Complaint  Patient presents with  . Medical Management of Chronic Issues    right leg. Hospital 01/20/15 I & D lower leg Dr. Migdalia Dk    HPI: Patient is a 81 y.o. female seen in the office today for follow up of chronic medical issues.   She had surgery last week on 8/24 by Dr. Migdalia Dk. Her R lower leg wound was irrigated, debrided and skin graft was placed, and Acell and a wound vac were placed at the donor site in her R lateral  upper thigh area. The wound vac is heavy and she is hopeful that Dr. Migdalia Dk will allow her to be disconnected from her at her appointment this afternoon. She has been crab walking due to Dr. Migdalia Dk wanting her to avoid extension and flexion of her foot and she notes this has thrown off other parts of her body. She is using a wheelchair today, but is able to walk. Otherwise, no complaints. Denies pain, fever, chills. Her bowels are working well. She has a good appetite.   She brought her new patient paperwork to this appointment and a good amount of time was spent answering her questions about and helping her complete the paperwork. Her medical, surgical, social, and family history was reviewed. Some cognitive issues were noted as she had a few moments of word searching and had a difficult time recalling her social security number. She has HCPOA- her daughter, Bufford Spikes and Advanced Directives, and she will locate copies of these for her chart. She read Being Mortal and is aware of the importance of having these in place.  She has had shingles 7 times and does not want the shingles vaccine. She says if she gets shingles again  she can deal with it.   Her mood is well despite the recent difficulty with her wounds. 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 Ms. Altidor says she has been a little crabby, but denies depression or suicidal ideation. She has a lot of support with 4 of her children cycling in and out of town to help with her care.   Her blood pressure remains well controlled. She is wondering if she should be on a statin. Previously she was followed by Dr. Mare Ferrari and stopped the statin due to her advanced age and she didn't think she really needed it. She then considered restarting, but she was due for knee replacement surgery and Dr. Mare Ferrari wanted to wait until after her surgery. However, knee replacement surgery was further delayed due to her wound infection issues that have scared her out of having the knee replacement surgery. So she is now thinking she should restart the statin.  Approximately 4 nights after her surgery she had some L upper quadrant abdominal pain. Right under her ribs. It was intermittent and would come and go. There was no chest pain, nausea, or other symptoms. She was evaluated by Blain Pais, [facility] nurse, and she decided to take some nitroglycerin. She felt better about 15 minutes after taking the nitro.  She was concerned about the process of seeing a doctor at [facility]. Dr. Mariea Clonts educated her  that in addition to Dr. Cyndi Lennert clinic day on Wednesdays, there is a Designer, jewellery available for appointments as well. And if she needs anything acutely she can press her button in her home to be evaluated by a nurse and they can contact Dr. Mariea Clonts or Alyse Low, NP. She also asked about medication refills and Dr. Mariea Clonts reviewed this process with her.   Approximately 45 minutes was spent with patient today reviewing patient's records, and providing coordination of care.   Review of Systems:  Review of Systems  Constitutional: Negative for fever, chills,  weight loss and malaise/fatigue.  Respiratory: Negative for cough, shortness of breath and wheezing.   Cardiovascular: Negative for chest pain, palpitations and orthopnea.  Gastrointestinal: Negative for heartburn, nausea, vomiting, diarrhea and constipation.  Genitourinary: Negative for dysuria, urgency and frequency.  Musculoskeletal: Positive for myalgias. Negative for falls.  Skin: Negative for rash.  Neurological: Negative for dizziness, tingling, sensory change and headaches.  Psychiatric/Behavioral: Positive for memory loss. Negative for depression and suicidal ideas. The patient is not nervous/anxious.     Past Medical History  Diagnosis Date  . BACK PAIN 03/08/2007  . DEPRESSIVE DISORDER 12/01/2008  . HYPERLIPIDEMIA 03/09/2006  . HYPERTENSION 03/09/2006  . KNEE PAIN 05/06/2009  . GI bleed 09/12/2010    Naproxen induced Endoscopy with dr Harlene Ramus   . Urinary urgency   . HIATAL HERNIA 12/17/2006  . History of hiatal hernia   . Arthritis     "all over"  . Pneumonia     86yrs. ago- hosp. pneumonia  . Cancer     squamous cell on R leg & face- ?basal cell  . Duodenal ulcer     Past Surgical History  Procedure Laterality Date  . Abdominal hysterectomy    . Oophorectomy    . Thyroidectomy    . Rotator cuff repair Right     x2  . Arthroscopic knee Right     x 2  . Tubal ligation    . Resection distal clavical    . Cardiac catheterization    . Back surgery  2000    spinal fusion  . Eye surgery      cataracts bilateral - removed  . Hernia repair Bilateral     inguinal   . [place]      moses surgery on R lle  . I&d extremity Right 12/10/2014    Procedure: IRRIGATION AND DEBRIDEMENT ON RIGHT LEG, ;  Surgeon: Theodoro Kos, DO;  Location: [place];  Service: Plastics;  Laterality: Right;  . Application of a-cell of extremity Right 12/10/2014    Procedure: APPLICATION OF A-CELL OF EXTREMITY;  Surgeon: Theodoro Kos, DO;  Location: [facility];  Service: Plastics;  Laterality: Right;  .  I&d extremity Right 01/20/2015    Procedure: IRRIGATION AND DEBRIDEMENT RIGHT LOWER LEG WOUND, with ACELL to Donor Site, SKIN GRAFT AND VAC Placement;  Surgeon: Theodoro Kos, DO;  Location: [place];  Service: Plastics;  Laterality: Right;    Allergies  Allergen Reactions  . Naproxen     Gi bleed  . Nsaids     GI BLEED  . Aspirin     GI bleed  . Ace Inhibitors     Cough   . Caffeine Other (See Comments)    Causes joints to be painful   Medications: Patient's Medications  New Prescriptions   No medications on file  Previous Medications   ACETAMINOPHEN (TYLENOL) 650 MG CR TABLET    Take 1 tablet (650 mg total)  by mouth as needed (for pain).   CALCIUM CITRATE PO    Take by mouth. 300-150 mg take 2 tablets daily   CHOLECALCIFEROL (VITAMIN D) 2000 UNITS CAPS    Take 2,000 Units by mouth daily.    CITALOPRAM (CELEXA) 10 MG TABLET    Take 1 tablet (10 mg total) by mouth daily.   CYCLOBENZAPRINE (FLEXERIL) 10 MG TABLET    Take 0.5 tablets (5 mg total) by mouth at bedtime.   KRILL OIL 1000 MG CAPS    Take 1,000 mg by mouth. Twice daily   LOSARTAN (COZAAR) 50 MG TABLET    Take one tablet by mouth once daily for blood pressure   LUTEIN 20 MG CAPS    Take 20 mg by mouth daily.    MULTIPLE VITAMIN (MULTIVITAMIN) TABLET    Take 1 tablet by mouth 2 (two) times daily.    PHOSPHATIDYLSERINE PO    Take 1,000 mg by mouth 2 (two) times daily.   PROBIOTIC PRODUCT (PROBIOTIC DAILY PO)    Take 1 tablet by mouth 2 (two) times daily.    VITAMIN C (ASCORBIC ACID) 500 MG TABLET    Take 1 tablet (500 mg total) by mouth daily.   ZINC 30 MG TABS    Take 2 tablets (60 mg total) by mouth daily.  Modified Medications   No medications on file  Discontinued Medications   CALCIUM-MAGNESIUM 300-300 MG TABS    Take 2 tablets by mouth daily.    Physical Exam: Filed Vitals:   01/27/15 0905  BP: 118/64  Pulse: 84  Temp: 97.9 F (36.6 C)  TempSrc: Oral  Weight: 126 lb (57.153 kg)  SpO2: 96%   Physical Exam   Constitutional: She is oriented to person, place, and time. She appears well-developed and well-nourished. No distress.  Eyes: Pupils are equal, round, and reactive to light.  Cardiovascular: Normal rate and intact distal pulses.  Exam reveals no gallop and no friction rub.   Murmur heard. Systolic murmur, irregular rhythm  Pulmonary/Chest: Effort normal and breath sounds normal. She has no wheezes. She has no rales.  Abdominal: Soft. Bowel sounds are normal. She exhibits no distension. There is no tenderness.  Musculoskeletal: She exhibits no edema.  Decreased ROM in R lower leg and foot intentionally due to surgeon's instructions. Otherwise, normal ROM in all extremities.  Neurological: She is alert and oriented to person, place, and time.  Skin: Skin is warm and dry.  Bandage and wound vac in place to L lower extremity- wound exam deferred to Dr. Migdalia Dk this afternoon  Psychiatric: She has a normal mood and affect. Her behavior is normal.  Cognitive deficits noted    Labs reviewed: Basic Metabolic Panel:  Recent Labs  12/01/14 0415 12/08/14 1540 01/20/15 1109  NA 139 138 139  K 3.9 3.8 4.0  CL 99* 102 103  CO2 32 28 28  GLUCOSE 97 106* 92  BUN 15 21* 23*  CREATININE 0.66 0.65 0.61  CALCIUM 9.1 9.3 9.1   Liver Function Tests:  Recent Labs  11/12/14 1207  AST 23  ALT 13*  ALKPHOS 62  BILITOT 0.9  PROT 7.1  ALBUMIN 4.2   No results for input(s): LIPASE, AMYLASE in the last 8760 hours. No results for input(s): AMMONIA in the last 8760 hours. CBC:  Recent Labs  11/12/14 1207 11/27/14 1906 12/01/14 0415 12/08/14 1540 01/20/15 1109  WBC 8.7 12.3* 7.4 9.5 5.9  NEUTROABS 5.5 9.5*  --   --   --  HGB 13.3 12.4 11.0* 11.6* 11.6*  HCT 41.0 38.5 34.2* 35.5* 34.5*  MCV 90.7 92.5 91.2 91.0 89.4  PLT 217 222 306 295 192   Lipid Panel: No results for input(s): CHOL, HDL, LDLCALC, TRIG, CHOLHDL, LDLDIRECT in the last 8760 hours. No results found for:  HGBA1C  Procedures since last visit:  Assessment/Plan 1. Dehiscence of external surgical wound - s/p surgical interventions- wound vac, skin graft, and Acell at donor site -f/u with Dr. Migdalia Dk today2. Ulcer of right lower leg, with unspecified severity - s/p surgical interventions -follow up with Dr. Migdalia Dk today  3. Surgical wound infection, subsequent encounter -infection resolved -following up with Dr. Migdalia Dk for wound healing  4. Cellulitis and abscess of leg - cellulitis resolved - now with wound healing issues -follw up with Dr. Migdalia Dk today as scheduled  5. Abdominal pain, left upper quadrant -one brief episode -now resolved -patient instructed to contact nursing if episode reoccurs   6. Depression - stable, good support from family -continue current dose of citaprolam  7. Essential hypertension - well controlled with current medications -continue losartan -will monitor and follow up as needed  8. Hyperlipemia -previously controlled with statin medication -last lipid levels were checked in August, 2015  -she is to make an appointment with Dr. Mare Ferrari for lipid level check and discussion re: does she need to restart statin?  9. History of herpes zoster -no current activation -she is not interested in zoster vaccine -monitor and treat for reactivation as needed  10. Advance directive indicates patient wish for do-not-resuscitate status - DNR (Do Not Resuscitate)       Labs/tests ordered: none  Next appt: 3 months for follow-up of chronic medical issues  Mariana Kaufman, RN, BSN, OCN [employer] Nurse Practitioner- [facility] [address], [place] [address] Office Phone:  [phone] Office Fax:  [phone]

## 2015-02-11 ENCOUNTER — Encounter: Payer: Self-pay | Admitting: Internal Medicine

## 2015-02-11 ENCOUNTER — Ambulatory Visit (INDEPENDENT_AMBULATORY_CARE_PROVIDER_SITE_OTHER): Payer: Medicare HMO | Admitting: Internal Medicine

## 2015-02-11 VITALS — BP 128/80 | HR 93 | Temp 98.0°F | Resp 20 | Ht 60.0 in | Wt 125.2 lb

## 2015-02-11 DIAGNOSIS — L97919 Non-pressure chronic ulcer of unspecified part of right lower leg with unspecified severity: Secondary | ICD-10-CM

## 2015-02-11 DIAGNOSIS — R413 Other amnesia: Secondary | ICD-10-CM | POA: Diagnosis not present

## 2015-02-11 DIAGNOSIS — F329 Major depressive disorder, single episode, unspecified: Secondary | ICD-10-CM | POA: Diagnosis not present

## 2015-02-11 DIAGNOSIS — Z780 Asymptomatic menopausal state: Secondary | ICD-10-CM

## 2015-02-11 DIAGNOSIS — E559 Vitamin D deficiency, unspecified: Secondary | ICD-10-CM | POA: Diagnosis not present

## 2015-02-11 DIAGNOSIS — F32A Depression, unspecified: Secondary | ICD-10-CM

## 2015-02-11 DIAGNOSIS — Z23 Encounter for immunization: Secondary | ICD-10-CM | POA: Diagnosis not present

## 2015-02-11 NOTE — Progress Notes (Signed)
Patient ID: Vanessa Mason, female   DOB: [birth year], 81 y.o.   MRN: [id number]   Location:  [facility]  Code Status: DNR Goals of Care: Advanced Directive information Does patient have an advance directive?: Yes, Type of Advance Directive: Healthcare Power of Attorney   Chief Complaint  Patient presents with  . Medical Management of Chronic Issues    wants to talk with Dr. Mariea Clonts    HPI: Patient is a 81 y.o. white female seen in the office today for med mgt of chronic diseases.  She is here with [name].    Memory:  Pt's other daughter, Hoyle Sauer, has expressed concern about this.  I am well aware she is having difficulty. She has come to an appt on the wrong day.  They went to see Dr. Orland Mustard, [place] doctor, about her memory.  Educated them that he is a PCP and I am considered a PCP even though I am a geriatrics specialist with training on managing memory loss.  He did labs on her:  Cbc, cmp, tsh, b12 and folate, Vit D, RPR.  Had vitamin D level checked and it was very low despite daily vitamin D 2000 units.  Now started on 50K weekly also.    Still doing shakes from rehab for protein.      Doing better now this week with her anxiety which [name] thinks is playing a big role in her memory.  There were a lot of restrictions in reference to her wound.  Still doesn't like restrictions and took a bit to get used to them, remember them.  Getting dressing each day by Mliss Sax b/c pt cannot drive.  Gets things in mail and stresses about them.  Says she remembered 2/3 of the words.  Remembers all three now.  He recommended an appt with a neurologist in October.    Skin graft--leg is doing very well. Does not want to go through with it all again.  At one point looked wrinkled up.  Adaptic layer was removed yesterday and it's looking better.  They reminded her to be patient.    Review of Systems:  Review of Systems  Constitutional: Negative for fever,  chills and malaise/fatigue.  HENT: Negative for hearing loss.   Eyes: Negative for blurred vision.  Respiratory: Negative for shortness of breath.   Cardiovascular: Negative for chest pain and leg swelling.  Gastrointestinal: Negative for abdominal pain and constipation.  Genitourinary: Negative for dysuria.  Musculoskeletal: Positive for joint pain. Negative for falls.  Skin: Negative for rash.       Ulcer healing slowly  Neurological: Negative for dizziness and weakness.  Endo/Heme/Allergies: Bruises/bleeds easily.  Psychiatric/Behavioral: Positive for memory loss. Negative for depression. The patient is nervous/anxious. The patient does not have insomnia.        Difficulty adjusting to new routine with her wound, needing more help and meds    Past Medical History  Diagnosis Date  . BACK PAIN 03/08/2007  . DEPRESSIVE DISORDER 12/01/2008  . HYPERLIPIDEMIA 03/09/2006  . HYPERTENSION 03/09/2006  . KNEE PAIN 05/06/2009  . GI bleed 09/12/2010    Naproxen induced Endoscopy with dr Harlene Ramus   . Urinary urgency   . HIATAL HERNIA 12/17/2006  . History of hiatal hernia   . Arthritis     "all over"  . Pneumonia     45yrs. ago- hosp. pneumonia  . Cancer     squamous cell on R leg & face- ?basal cell  .  Duodenal ulcer     Past Surgical History  Procedure Laterality Date  . Abdominal hysterectomy  1975  . Oophorectomy  1989  . Thyroidectomy    . Rotator cuff repair Right 2001    x2  . Arthroscopic knee Right     x 2  . Tubal ligation    . Resection distal clavical    . Cardiac catheterization    . Back surgery  2000    spinal fusion  . Eye surgery      cataracts bilateral - removed  . Hernia repair Bilateral     inguinal   . [place]      moses surgery on R lle  . I&d extremity Right 12/10/2014    Procedure: IRRIGATION AND DEBRIDEMENT ON RIGHT LEG, ;  Surgeon: Theodoro Kos, DO;  Location: [facility];  Service: Plastics;  Laterality: Right;  . Application of a-cell of extremity Right  12/10/2014    Procedure: APPLICATION OF A-CELL OF EXTREMITY;  Surgeon: Theodoro Kos, DO;  Location: [facility];  Service: Plastics;  Laterality: Right;  . I&d extremity Right 01/20/2015    Procedure: IRRIGATION AND DEBRIDEMENT RIGHT LOWER LEG WOUND, with ACELL to Donor Site, SKIN GRAFT AND VAC Placement;  Surgeon: Theodoro Kos, DO;  Location: [place] [facility];  Service: Plastics;  Laterality: Right;  . Tonsillectomy  1928  . Bilateral salpingectomy Bilateral 1989    Dr. Marianna Fuss  . Lumbar laminectomy  02-17-1999    with sinal fusion L3,4,5,&S1    Allergies  Allergen Reactions  . Naproxen     Gi bleed  . Nsaids     GI BLEED  . Aspirin     GI bleed  . Ace Inhibitors     Cough   . Caffeine Other (See Comments)    Causes joints to be painful   Medications: Patient's Medications  New Prescriptions   No medications on file  Previous Medications   ACETAMINOPHEN (TYLENOL) 650 MG CR TABLET    Take 1 tablet (650 mg total) by mouth as needed (for pain).   CALCIUM CITRATE PO    Take 150-300 mg by mouth 2 (two) times daily. 300-150 mg take 2 tablets daily   CHOLECALCIFEROL (VITAMIN D) 2000 UNITS CAPS    Take 50,000 Units by mouth once a week.    CITALOPRAM (CELEXA) 10 MG TABLET    Take 1 tablet (10 mg total) by mouth daily.   CYCLOBENZAPRINE (FLEXERIL) 10 MG TABLET    Take 0.5 tablets (5 mg total) by mouth at bedtime.   KRILL OIL 1000 MG CAPS    Take 1,000 mg by mouth. Twice daily   LOSARTAN (COZAAR) 50 MG TABLET    Take one tablet by mouth once daily for blood pressure   LUTEIN 20 MG CAPS    Take 20 mg by mouth daily.    MULTIPLE VITAMIN (MULTIVITAMIN) TABLET    Take 1 tablet by mouth 2 (two) times daily.    PHOSPHATIDYLSERINE PO    Take 1,000 mg by mouth 2 (two) times daily.   PROBIOTIC PRODUCT (PROBIOTIC DAILY PO)    Take 1 tablet by mouth 2 (two) times daily.    VITAMIN C (ASCORBIC ACID) 500 MG TABLET    Take 1 tablet (500 mg total) by mouth daily.   ZINC 30 MG TABS    Take 2 tablets (60 mg total) by  mouth daily.  Modified Medications   No medications on file  Discontinued Medications   No medications on  file    Physical Exam: Filed Vitals:   02/11/15 1118  BP: 128/80  Pulse: 93  Temp: 98 F (36.7 C)  TempSrc: Oral  Resp: 20  Height: 5' (1.524 m)  Weight: 125 lb 3.2 oz (56.79 kg)  SpO2: 97%   Physical Exam  Constitutional: She is oriented to person, place, and time. She appears well-developed and well-nourished. No distress.  Cardiovascular: Normal rate, regular rhythm, normal heart sounds and intact distal pulses.   Pulmonary/Chest: Effort normal and breath sounds normal.  Abdominal: Soft. Bowel sounds are normal.  Musculoskeletal:  Still limping; walking with rolling walker  Neurological: She is alert and oriented to person, place, and time.  Some short term memory loss  Skin: Skin is warm and dry.  Has dressings covering wound being managed by wound care center and getting daily dressing changes by [facility]  Psychiatric: She has a normal mood and affect.    Labs reviewed: Basic Metabolic Panel:  Recent Labs  12/01/14 0415 12/08/14 1540 01/20/15 1109  NA 139 138 139  K 3.9 3.8 4.0  CL 99* 102 103  CO2 32 28 28  GLUCOSE 97 106* 92  BUN 15 21* 23*  CREATININE 0.66 0.65 0.61  CALCIUM 9.1 9.3 9.1   Liver Function Tests:  Recent Labs  11/12/14 1207  AST 23  ALT 13*  ALKPHOS 62  BILITOT 0.9  PROT 7.1  ALBUMIN 4.2   No results for input(s): LIPASE, AMYLASE in the last 8760 hours. No results for input(s): AMMONIA in the last 8760 hours. CBC:  Recent Labs  11/12/14 1207 11/27/14 1906 12/01/14 0415 12/08/14 1540 01/20/15 1109  WBC 8.7 12.3* 7.4 9.5 5.9  NEUTROABS 5.5 9.5*  --   --   --   HGB 13.3 12.4 11.0* 11.6* 11.6*  HCT 41.0 38.5 34.2* 35.5* 34.5*  MCV 90.7 92.5 91.2 91.0 89.4  PLT 217 222 306 295 192    Assessment/Plan 1. Memory loss, short term - discussed that the labs she had were normal in terms of memory workup; referral had  been placed by Dr. Orland Mustard to neurology--advised pt cancel--not needed at this point -will monitor mmse annually and at this point check baseline CT brain to check for any vascular changes - CT Head Wo Contrast; Future  2. Depression -is more anxious than anything and stressed with multiple doctors' visits, etc. -cont citalopram -discussed we could consider changing her antidepressant if her anxiety remains out of control  3. Vitamin D deficiency - agree with vitamin D 50K weekly for 8 wks, then return to 2000 units daily--will need to make sure she does this at her f/u visit in Nov - DG Bone Density; Future  4. Postmenopausal estrogen deficiency - needs f/u bone density--I don't have a copy of the one from 4 years ago to compare -replete vitamin D - DG Bone Density; Future  5. Encounter for immunization -flu shot was given today  6. Ulcer of right lower leg, with unspecified severity -keep f/u with Dr. Migdalia Dk et al, cont daily dressing changes and precautions as per her office  Labs/tests ordered:   Orders Placed This Encounter  Procedures  . DG Bone Density    Standing Status: Future     Number of Occurrences:      Standing Expiration Date: 04/12/2016    Order Specific Question:  Reason for Exam (SYMPTOM  OR DIAGNOSIS REQUIRED)    Answer:  vitamin D deficiency    Order Specific Question:  Preferred  imaging location?    Answer:  [facility]  . CT Head Wo Contrast    Standing Status: Future     Number of Occurrences:      Standing Expiration Date: 05/13/2016    Order Specific Question:  Reason for Exam (SYMPTOM  OR DIAGNOSIS REQUIRED)    Answer:  memory loss    Order Specific Question:  Preferred imaging location?    Answer:  [facility]  . Flu Vaccine QUAD 36+ mos IM    Next appt: has appt Nov 30th  Taksh Hjort L. Chardonay Scritchfield, D.O. [facility] [address], [place] [address] Cell Phone (Mon-Fri 8am-5pm):   [phone] On Call:  [phone] & follow prompts after 5pm & weekends Office Phone:  [phone] Office Fax:  [phone]

## 2015-02-16 ENCOUNTER — Other Ambulatory Visit: Payer: Self-pay | Admitting: Internal Medicine

## 2015-02-19 ENCOUNTER — Other Ambulatory Visit: Payer: Self-pay | Admitting: Internal Medicine

## 2015-02-24 ENCOUNTER — Inpatient Hospital Stay: Admission: RE | Admit: 2015-02-24 | Payer: Medicare HMO | Source: Ambulatory Visit

## 2015-02-24 ENCOUNTER — Other Ambulatory Visit: Payer: Self-pay | Admitting: Internal Medicine

## 2015-02-24 DIAGNOSIS — W19XXXA Unspecified fall, initial encounter: Secondary | ICD-10-CM

## 2015-02-24 DIAGNOSIS — M25531 Pain in right wrist: Secondary | ICD-10-CM

## 2015-02-24 NOTE — Progress Notes (Signed)
[facility] nurse assessed pt due to fall last night.  She has 7-8/10 pain in her right wrist associated with swelling.  Has difficulty moving the wrist.  xrays of right wrist and hand ordered.

## 2015-02-25 ENCOUNTER — Ambulatory Visit
Admission: RE | Admit: 2015-02-25 | Discharge: 2015-02-25 | Disposition: A | Discharge status: Home / Self Care | Payer: Medicare HMO | Source: Ambulatory Visit | Attending: Internal Medicine | Admitting: Internal Medicine

## 2015-02-25 DIAGNOSIS — W19XXXA Unspecified fall, initial encounter: Secondary | ICD-10-CM

## 2015-02-25 DIAGNOSIS — M25531 Pain in right wrist: Secondary | ICD-10-CM

## 2015-03-03 ENCOUNTER — Ambulatory Visit
Admission: RE | Admit: 2015-03-03 | Discharge: 2015-03-03 | Disposition: A | Discharge status: Home / Self Care | Payer: Medicare HMO | Source: Ambulatory Visit | Attending: Internal Medicine | Admitting: Internal Medicine

## 2015-03-03 DIAGNOSIS — Z780 Asymptomatic menopausal state: Secondary | ICD-10-CM

## 2015-03-03 DIAGNOSIS — E559 Vitamin D deficiency, unspecified: Secondary | ICD-10-CM

## 2015-03-04 ENCOUNTER — Ambulatory Visit
Admission: RE | Admit: 2015-03-04 | Discharge: 2015-03-04 | Disposition: A | Discharge status: Home / Self Care | Payer: Medicare HMO | Source: Ambulatory Visit | Attending: Internal Medicine | Admitting: Internal Medicine

## 2015-03-04 DIAGNOSIS — R413 Other amnesia: Secondary | ICD-10-CM

## 2015-03-16 ENCOUNTER — Ambulatory Visit: Payer: Medicare HMO | Admitting: Neurology

## 2015-03-26 ENCOUNTER — Ambulatory Visit
Admission: RE | Admit: 2015-03-26 | Discharge: 2015-03-26 | Disposition: A | Discharge status: Home / Self Care | Payer: Medicare HMO | Source: Ambulatory Visit | Attending: Internal Medicine | Admitting: Internal Medicine

## 2015-03-26 ENCOUNTER — Telehealth: Payer: Self-pay

## 2015-03-26 DIAGNOSIS — M25562 Pain in left knee: Secondary | ICD-10-CM

## 2015-03-26 NOTE — Telephone Encounter (Signed)
Vanessa Mason from [facility] clinic called, patient fell at movie theater, her left knee is swollen and pain when she moves it. Would like order for x-ray. Asked Dr. Mariea Clonts ok for knee and lower leg x-ray. Ice it, wrap, elevate leg.

## 2015-04-13 ENCOUNTER — Ambulatory Visit
Admission: RE | Admit: 2015-04-13 | Discharge: 2015-04-13 | Disposition: A | Discharge status: Home / Self Care | Payer: Medicare HMO | Source: Ambulatory Visit | Attending: Internal Medicine | Admitting: Internal Medicine

## 2015-04-16 ENCOUNTER — Other Ambulatory Visit: Payer: Self-pay | Admitting: Family Medicine

## 2015-04-19 ENCOUNTER — Other Ambulatory Visit: Payer: Self-pay | Admitting: Family Medicine

## 2015-04-28 ENCOUNTER — Non-Acute Institutional Stay: Payer: Medicare HMO | Admitting: Internal Medicine

## 2015-04-28 ENCOUNTER — Encounter: Payer: Self-pay | Admitting: Internal Medicine

## 2015-04-28 VITALS — BP 110/64 | HR 72 | Temp 98.1°F | Wt 127.0 lb

## 2015-04-28 DIAGNOSIS — C44320 Squamous cell carcinoma of skin of unspecified parts of face: Secondary | ICD-10-CM | POA: Diagnosis not present

## 2015-04-28 DIAGNOSIS — R413 Other amnesia: Secondary | ICD-10-CM | POA: Diagnosis not present

## 2015-04-28 DIAGNOSIS — I10 Essential (primary) hypertension: Secondary | ICD-10-CM | POA: Diagnosis not present

## 2015-04-28 DIAGNOSIS — E785 Hyperlipidemia, unspecified: Secondary | ICD-10-CM | POA: Diagnosis not present

## 2015-04-28 DIAGNOSIS — M810 Age-related osteoporosis without current pathological fracture: Secondary | ICD-10-CM | POA: Diagnosis not present

## 2015-04-28 DIAGNOSIS — E559 Vitamin D deficiency, unspecified: Secondary | ICD-10-CM | POA: Diagnosis not present

## 2015-04-28 MED ORDER — ALENDRONATE SODIUM 70 MG PO TABS
70.0000 mg | ORAL_TABLET | ORAL | Status: DC
Start: 2015-04-28 — End: 2016-01-05

## 2015-04-28 NOTE — Patient Instructions (Signed)
I answered your daughter's questions on her paper. Also, we started fosamax weekly for your osteoporosis--I sent it to your pharmacy. I entered a referral to dermatology and to cardiology at your request. Continue your calcium and vitamin D supplements You can stop other supplements and probiotics at this point We reviewed your CT again and discussed healthy diet, exercising more regularly at least 5 days a week total We will check your cbc, cmp and flp (blood counts, electrolytes/kidneys/liver, cholesterol) next Tuesday and I will see you again in 3 months.  We may start you back on a cholesterol med other than a statin for your cholesterol if it is high.

## 2015-04-28 NOTE — Progress Notes (Signed)
Patient ID: Vanessa Mason, female   DOB: April 18, 1918, 81 y.o.   MRN: [id number]   Location:  [facility]  Code Status: DNR Goals of Care:Advanced Directive information Does patient have an advance directive?: Yes, Type of Advance Directive: [place];Out of facility DNR (pink MOST or yellow form), Pre-existing out of facility DNR order (yellow form or pink MOST form): Yellow form placed in chart (order not valid for inpatient use)  Chief Complaint  Patient presents with  . Medical Management of Chronic Issues    wants to talk about bone density, CT done 03/04/15, about fall 03/19/15, labs done 01/29/15    HPI: Patient is a 81 y.o. white female seen in the [facility] today for med mgt of her chronic diseases.  She is here alone and does not remember that she's been seen since her CT, labs.    She really fell at the theatre and hit her left knee which was already a problem.  Still hurting her.  She is meant to be here about her bone density test results.  She took 8 wks of vitamin D2 weekly and completed it.  She is also on daily vit D. And calcium bid.  She has never taken fosamax, boniva or anything.  Discussed fosamax now.  She asks if she's too old to get benefit.    Shows me her impressive results from her "pig bladder" grafts on her right leg.  They are healed.  She is doing well walking for the most part with her walker aside from the fall 10/28 at the movies.    Wants a new dermatology office at family request after the skin surgery of her right leg wound up with MRSA infection.   Wants a new cardiologist due to Dr. Mare Ferrari nearing retirement.  Per the records I have, she's only had hypertension, but did have a cath in the past.  She has no chest pain, shortness of breath, edema, arrhythmias.  Discussed CT brain at the previous visit.  Discussed how it showed some atrophy and chronic ischemic changes.  She says it was scary when she heard results.   Notes word-finding as the most frustrating part and suddenly she thinks of it another day.    Has been walking more.  Went all the way from her home today.  Has been skipping exercise since end of jan--get back to it three times per week.    Says she gets befuddled when she's not organized.  Drove 400 miles over the weekend.  Went out beyond [place].  Kids watch her and say she is a good driver.    Discussed bp being excellent.  Lipids need to be rechecked.  Talked and diet and exercise.    Review of Systems:  Review of Systems  Constitutional: Negative for fever and chills.  HENT: Negative for congestion.   Eyes: Negative for blurred vision.  Respiratory: Negative for shortness of breath.   Cardiovascular: Negative for chest pain.  Gastrointestinal: Negative for abdominal pain.  Genitourinary: Negative for dysuria.  Musculoskeletal: Positive for falls.  Skin: Negative for rash.       Right shin wound healed  Neurological: Negative for dizziness.  Psychiatric/Behavioral: Positive for memory loss. Negative for depression. The patient is nervous/anxious. The patient does not have insomnia.     Past Medical History  Diagnosis Date  . BACK PAIN 03/08/2007  . DEPRESSIVE DISORDER 12/01/2008  . HYPERLIPIDEMIA 03/09/2006  . HYPERTENSION 03/09/2006  .  KNEE PAIN 05/06/2009  . GI bleed 09/12/2010    Naproxen induced Endoscopy with dr Harlene Ramus   . Urinary urgency   . HIATAL HERNIA 12/17/2006  . History of hiatal hernia   . Arthritis     "all over"  . Pneumonia     63yrs. ago- hosp. pneumonia  . Cancer (HCC)     squamous cell on R leg & face- ?basal cell  . Duodenal ulcer     Past Surgical History  Procedure Laterality Date  . Abdominal hysterectomy  1975  . Oophorectomy  1989  . Thyroidectomy    . Rotator cuff repair Right 2001    x2  . Arthroscopic knee Right     x 2  . Tubal ligation    . Resection distal clavical    . Cardiac catheterization    . Back surgery  2000     spinal fusion  . Eye surgery      cataracts bilateral - removed  . Hernia repair Bilateral     inguinal   . [place]      moses surgery on R lle  . I&d extremity Right 12/10/2014    Procedure: IRRIGATION AND DEBRIDEMENT ON RIGHT LEG, ;  Surgeon: Theodoro Kos, DO;  Location: [place];  Service: Plastics;  Laterality: Right;  . Application of a-cell of extremity Right 12/10/2014    Procedure: APPLICATION OF A-CELL OF EXTREMITY;  Surgeon: Theodoro Kos, DO;  Location: [place];  Service: Plastics;  Laterality: Right;  . I&d extremity Right 01/20/2015    Procedure: IRRIGATION AND DEBRIDEMENT RIGHT LOWER LEG WOUND, with ACELL to Donor Site, SKIN GRAFT AND VAC Placement;  Surgeon: Theodoro Kos, DO;  Location: [place];  Service: Plastics;  Laterality: Right;  . Tonsillectomy  1928  . Bilateral salpingectomy Bilateral 1989    Dr. Marianna Fuss  . Lumbar laminectomy  02-17-1999    with sinal fusion L3,4,5,&S1    Social History:   reports that she has quit smoking. She has never used smokeless tobacco. She reports that she drinks about 4.2 oz of alcohol per week. She reports that she does not use illicit drugs.  Allergies  Allergen Reactions  . Naproxen     Gi bleed  . Nsaids     GI BLEED  . Aspirin     GI bleed  . Ace Inhibitors     Cough   . Caffeine Other (See Comments)    Causes joints to be painful    Medications: Patient's Medications  New Prescriptions   No medications on file  Previous Medications   ACETAMINOPHEN (TYLENOL) 650 MG CR TABLET    Take 1 tablet (650 mg total) by mouth as needed (for pain).   CALCIUM CITRATE PO    Take 150-300 mg by mouth 2 (two) times daily. 300-150 mg take 2 tablets daily   CHOLECALCIFEROL (VITAMIN D3) 2000 UNITS TABS    Take by mouth daily.   CITALOPRAM (CELEXA) 10 MG TABLET    Take 1 tablet (10 mg total) by mouth daily.   CITALOPRAM (CELEXA) 10 MG TABLET    TAKE ONE TABLET BY MOUTH ONCE DAILY   CITALOPRAM (CELEXA) 10 MG TABLET    TAKE ONE TABLET BY MOUTH ONCE  DAILY   CYCLOBENZAPRINE (FLEXERIL) 10 MG TABLET    Take 0.5 tablets (5 mg total) by mouth at bedtime.   CYCLOBENZAPRINE (FLEXERIL) 10 MG TABLET    TAKE ONE-HALF TABLET BY MOUTH AT BEDTIME   KRILL OIL 1000  MG CAPS    Take 1,000 mg by mouth. Twice daily   LOSARTAN (COZAAR) 50 MG TABLET    Take one tablet by mouth once daily for blood pressure   LUTEIN 20 MG CAPS    Take 20 mg by mouth daily.    MULTIPLE VITAMIN (MULTIVITAMIN) TABLET    Take 1 tablet by mouth 2 (two) times daily.    PHOSPHATIDYLSERINE PO    Take 1,000 mg by mouth 2 (two) times daily.   PROBIOTIC PRODUCT (PROBIOTIC DAILY PO)    Take 1 tablet by mouth 2 (two) times daily.    VITAMIN C (ASCORBIC ACID) 500 MG TABLET    Take 1 tablet (500 mg total) by mouth daily.   ZINC 30 MG TABS    Take 2 tablets (60 mg total) by mouth daily.  Modified Medications   No medications on file  Discontinued Medications   No medications on file     Physical Exam: Filed Vitals:   04/28/15 1412  BP: 110/64  Pulse: 72  Temp: 98.1 F (36.7 C)  TempSrc: Oral  Weight: 127 lb (57.607 kg)  SpO2: 95%   Body mass index is 24.8 kg/(m^2). Physical Exam  Constitutional: She is oriented to person, place, and time. She appears well-developed and well-nourished. No distress.  Cardiovascular: Normal rate, regular rhythm, normal heart sounds and intact distal pulses.   Pulmonary/Chest: Effort normal and breath sounds normal. No respiratory distress.  Abdominal: Bowel sounds are normal.  Musculoskeletal: Normal range of motion.  Walks with rolling walker  Neurological: She is alert and oriented to person, place, and time.  Has some short term memory loss during appt  Skin: Skin is warm and dry.  Right shin wound healed, graft site distally barely visible at this point  Psychiatric: She has a normal mood and affect.     Labs reviewed: Basic Metabolic Panel:  Recent Labs  12/01/14 0415 12/08/14 1540 01/20/15 1109  NA 139 138 139  K 3.9 3.8 4.0   CL 99* 102 103  CO2 32 28 28  GLUCOSE 97 106* 92  BUN 15 21* 23*  CREATININE 0.66 0.65 0.61  CALCIUM 9.1 9.3 9.1   Liver Function Tests:  Recent Labs  11/12/14 1207  AST 23  ALT 13*  ALKPHOS 62  BILITOT 0.9  PROT 7.1  ALBUMIN 4.2   No results for input(s): LIPASE, AMYLASE in the last 8760 hours. No results for input(s): AMMONIA in the last 8760 hours. CBC:  Recent Labs  11/12/14 1207 11/27/14 1906 12/01/14 0415 12/08/14 1540 01/20/15 1109  WBC 8.7 12.3* 7.4 9.5 5.9  NEUTROABS 5.5 9.5*  --   --   --   HGB 13.3 12.4 11.0* 11.6* 11.6*  HCT 41.0 38.5 34.2* 35.5* 34.5*  MCV 90.7 92.5 91.2 91.0 89.4  PLT 217 222 306 295 192   Lipid Panel: No results for input(s): CHOL, HDL, LDLCALC, TRIG, CHOLHDL, LDLDIRECT in the last 8760 hours. No results found for: HGBA1C  Procedures since last appt:  Patient Care Team: Gayland Curry, DO as PCP - General (Geriatric Medicine) Ronald Lobo, MD as Consulting Physician (Gastroenterology) Vickey Huger, MD as Consulting Physician (Orthopedic Surgery) Izora Ribas as Consulting Physician (Dermatology) Darlin Coco, MD as Consulting Physician (Cardiology) Luberta Mutter, MD as Consulting Physician (Ophthalmology)  Assessment/Plan 1. Senile osteoporosis - continue her calcium and vitamin D supplements -encouraged more weightbearing exercise - alendronate (FOSAMAX) 70 MG tablet; Take 1 tablet (70 mg total) by mouth every  7 (seven) days. Take with a full glass of water on an empty stomach.  Dispense: 4 tablet; Refill: 11  2. Memory loss, short term -she has cognitive impairment -brain imaging showed chronic ischemic vascular changes and atrophy and we reviewed all of this at her last visit when family was here  81. Vitamin D deficiency -cont vitamin D supplement  4. Essential hypertension - bp at goal with losartan - Ambulatory referral to Cardiology  5. Hyperlipidemia -last lipids were ok, but will recheck recent  labs -cont krill oil -may need to add a fibrate or welchol/zetia if not at goal now  6. Squamous cell skin cancer, face - and multiple other body parts historically that have required mohs procedures including her right shin - I don't recommend she have any further mohs surgery on her lower legs due to poor circulation and prior infection -she wants a new dermatologist--previously been to skin surgery center, Dr. Budd Palmer placed - Ambulatory referral to Dermatology  Labs/tests ordered:   Orders Placed This Encounter  Procedures  . Ambulatory referral to Dermatology    Referral Priority:  Routine    Referral Type:  Consultation    Referral Reason:  Specialty Services Required    Requested Specialty:  Dermatology    Number of Visits Requested:  1  . Ambulatory referral to Cardiology    Referral Priority:  Routine    Referral Type:  Consultation    Referral Reason:  Specialty Services Required    Requested Specialty:  Cardiology    Number of Visits Requested:  1  cbc, cmp, flp next Tuesday at [place] at [facility]  Next appt:  Keep 2/15 appt  Aadan Chenier L. Asna Muldrow, D.O. [facility] [address], [place] [address] Cell Phone (Mon-Fri 8am-5pm):  [phone] On Call:  [phone] & follow prompts after 5pm & weekends Office Phone:  [phone] Office Fax:  [phone]

## 2015-05-04 ENCOUNTER — Telehealth: Payer: Self-pay

## 2015-05-04 LAB — CBC AND DIFFERENTIAL
HCT: 38 % (ref 36–46)
Hemoglobin: 12.6 g/dL (ref 12.0–16.0)
Platelets: 192 10*3/uL (ref 150–399)
WBC: 7.5 10^3/mL

## 2015-05-04 LAB — BASIC METABOLIC PANEL
BUN: 25 mg/dL — AB (ref 4–21)
Creatinine: 0.7 mg/dL (ref 0.5–1.1)
Glucose: 97 mg/dL
Potassium: 4.2 mmol/L (ref 3.4–5.3)
Sodium: 141 mmol/L (ref 137–147)

## 2015-05-04 LAB — HEPATIC FUNCTION PANEL
ALT: 8 U/L (ref 7–35)
AST: 16 U/L (ref 13–35)
Alkaline Phosphatase: 59 U/L (ref 25–125)
Bilirubin, Total: 0.7 mg/dL

## 2015-05-04 LAB — LIPID PANEL
Cholesterol: 214 mg/dL — AB (ref 0–200)
HDL: 66 mg/dL (ref 35–70)
LDL Cholesterol: 129 mg/dL
LDl/HDL Ratio: 2
Triglycerides: 92 mg/dL (ref 40–160)

## 2015-05-04 MED ORDER — EZETIMIBE 10 MG PO TABS: 10.0000 mg | ORAL_TABLET | Freq: Every day | ORAL | Status: DC

## 2015-05-04 NOTE — Telephone Encounter (Signed)
Called patient about 05/04/15 labs, (daughter-in-law Olean Ree was there too) blood count normal, CMP normal, per Dr. Mariea Clonts bad cholesterol above goat of <100, she recommend she begin Zetia 10mg  one daily with evening meal. Also continue increasing walking. Will fax Zetia to [facility] on [address]. Will get a copy of her labs down stairs at the main desk.

## 2015-05-06 ENCOUNTER — Ambulatory Visit: Payer: Medicare HMO | Admitting: Cardiology

## 2015-05-27 ENCOUNTER — Encounter: Payer: Self-pay | Admitting: Internal Medicine

## 2015-06-08 ENCOUNTER — Telehealth: Payer: Self-pay | Admitting: *Deleted

## 2015-06-08 NOTE — Telephone Encounter (Signed)
Son, Josph Macho called and stated that he just wanted to let you know that he has gotten his mother an appointment with the Dermatologist, Dr. Denna Haggard 06/09/2015 and an appointment with the Cardiologist, Dr. Percival Spanish 06/11/2015.

## 2015-06-08 NOTE — Telephone Encounter (Signed)
FYI

## 2015-06-09 ENCOUNTER — Other Ambulatory Visit: Payer: Self-pay | Admitting: Dermatology

## 2015-06-10 ENCOUNTER — Telehealth: Payer: Self-pay | Admitting: *Deleted

## 2015-06-10 NOTE — Telephone Encounter (Signed)
Patient called to ask if she could have something less expensive than the Zetia, this medication is very costly. Please Advise!

## 2015-06-11 ENCOUNTER — Encounter: Payer: Self-pay | Admitting: Cardiology

## 2015-06-11 ENCOUNTER — Ambulatory Visit (INDEPENDENT_AMBULATORY_CARE_PROVIDER_SITE_OTHER): Payer: Medicare HMO | Admitting: Cardiology

## 2015-06-11 VITALS — BP 146/80 | HR 84 | Ht 60.0 in | Wt 128.1 lb

## 2015-06-11 DIAGNOSIS — R9431 Abnormal electrocardiogram [ECG] [EKG]: Secondary | ICD-10-CM

## 2015-06-11 NOTE — Progress Notes (Signed)
Cardiology Office Note   Date:  06/11/2015   ID:  Vanessa Mason, DOB 1917/07/18, MRN [id number]  PCP:  Hollace Kinnier, DO  Cardiologist:   Minus Breeding, MD   No chief complaint on file.     History of Present Illness: Vanessa Mason is a 82 y.o. female who presents for a first visit with me. She previously saw Dr. Mare Ferrari.  She has a history of left bundle branch block. I see a couple of old stress test and I did review these. The most recent was 2015 prior to a possible knee surgery which she never had. This was a negative stress perfusion study. She's had left bundle branch block for some time. I did review a hospitalization and went over this with her. She had some leg wounds that have been treated after a Mohs surgery. These became infected and she had staff. With this and some slow progression of memory issue she is ultimately decided not to have knee surgery.  She lives at [place] and she gets around with her walker though she walked in today without a walker. She's not been doing any of the usual exercises she used to do. She gets a little short of breath walking up an incline but this has been chronic.  The patient denies any new symptoms such as chest discomfort, neck or arm discomfort. There has been no PND or orthopnea. There have been no reported palpitations, presyncope or syncope.   Past Medical History  Diagnosis Date  . BACK PAIN 03/08/2007  . DEPRESSIVE DISORDER 12/01/2008  . HYPERLIPIDEMIA 03/09/2006  . HYPERTENSION 03/09/2006  . KNEE PAIN 05/06/2009  . GI bleed 09/12/2010    Naproxen induced Endoscopy with dr Harlene Ramus   . Urinary urgency   . HIATAL HERNIA 12/17/2006  . History of hiatal hernia   . Arthritis     "all over"  . Pneumonia     73yrs. ago- hosp. pneumonia  . Cancer (HCC)     squamous cell on R leg & face- ?basal cell  . Duodenal ulcer     Past Surgical History  Procedure Laterality Date  . Abdominal hysterectomy  1975  .  Oophorectomy  1989  . Thyroidectomy    . Rotator cuff repair Right 2001    x2  . Arthroscopic knee Right     x 2  . Tubal ligation    . Resection distal clavical    . Cardiac catheterization    . Back surgery  2000    spinal fusion  . Eye surgery      cataracts bilateral - removed  . Hernia repair Bilateral     inguinal   . [place]      moses surgery on R lle  . I&d extremity Right 12/10/2014    Procedure: IRRIGATION AND DEBRIDEMENT ON RIGHT LEG, ;  Surgeon: Theodoro Kos, DO;  Location: [place];  Service: Plastics;  Laterality: Right;  . Application of a-cell of extremity Right 12/10/2014    Procedure: APPLICATION OF A-CELL OF EXTREMITY;  Surgeon: Theodoro Kos, DO;  Location: [place];  Service: Plastics;  Laterality: Right;  . I&d extremity Right 01/20/2015    Procedure: IRRIGATION AND DEBRIDEMENT RIGHT LOWER LEG WOUND, with ACELL to Donor Site, SKIN GRAFT AND VAC Placement;  Surgeon: Theodoro Kos, DO;  Location: [place];  Service: Plastics;  Laterality: Right;  . Tonsillectomy  1928  . Bilateral salpingectomy Bilateral 1989    Dr. Marianna Fuss  . Lumbar  laminectomy  02-17-1999    with sinal fusion L3,4,5,&S1     Current Outpatient Prescriptions  Medication Sig Dispense Refill  . acetaminophen (TYLENOL) 650 MG CR tablet Take 1 tablet (650 mg total) by mouth as needed (for pain). 30 tablet 0  . alendronate (FOSAMAX) 70 MG tablet Take 1 tablet (70 mg total) by mouth every 7 (seven) days. Take with a full glass of water on an empty stomach. 4 tablet 11  . CALCIUM CITRATE PO Take 150-300 mg by mouth 2 (two) times daily. 300-150 mg take 2 tablets daily    . Cholecalciferol (VITAMIN D3) 2000 UNITS TABS Take by mouth daily.    . citalopram (CELEXA) 10 MG tablet TAKE ONE TABLET BY MOUTH ONCE DAILY 30 tablet 5  . cyclobenzaprine (FLEXERIL) 10 MG tablet TAKE ONE-HALF TABLET BY MOUTH AT BEDTIME 45 tablet 5  . KRILL OIL 1000 MG CAPS Take 1,000 mg by mouth. Twice daily    . losartan (COZAAR) 50 MG tablet  Take one tablet by mouth once daily for blood pressure 90 tablet 3  . Lutein 20 MG CAPS Take 20 mg by mouth daily.     . Multiple Vitamin (MULTIVITAMIN) tablet Take 1 tablet by mouth 2 (two) times daily.     . Probiotic Product (PROBIOTIC DAILY PO) Take 1 tablet by mouth 2 (two) times daily.      No current facility-administered medications for this visit.    Allergies:   Naproxen; Nsaids; Aspirin; Ace inhibitors; and Caffeine    ROS:  Please see the history of present illness.   Otherwise, review of systems are positive for none.   All other systems are reviewed and negative.    PHYSICAL EXAM: VS:  BP 146/80 mmHg  Pulse 84  Ht 5' (1.524 m)  Wt 128 lb 2 oz (58.117 kg)  BMI 25.02 kg/m2 , BMI Body mass index is 25.02 kg/(m^2). GENERAL:  Well appearing HEENT:  Pupils equal round and reactive, fundi not visualized, oral mucosa unremarkable NECK:  No jugular venous distention, waveform within normal limits, carotid upstroke brisk and symmetric, no bruits, no thyromegaly LYMPHATICS:  No cervical, inguinal adenopathy LUNGS:  Clear to auscultation bilaterally BACK:  No CVA tenderness CHEST:  Unremarkable HEART:  PMI not displaced or sustained,S1 and S2 within normal limits, no S3, no S4, no clicks, no rubs, no murmurs ABD:  Flat, positive bowel sounds normal in frequency in pitch, no bruits, no rebound, no guarding, no midline pulsatile mass, no hepatomegaly, no splenomegaly EXT:  2 plus pulses throughout, no edema, no cyanosis no clubbing SKIN:  No rashes no nodules NEURO:  Cranial nerves II through XII grossly intact, motor grossly intact throughout PSYCH:  Cognitively intact, oriented to person place and time    EKG:  EKG is not ordered today. The ekg ordered 10/12/14 demonstrates incomplete left bundle branch block with premature atrial contractions but no acute ST-T wave changes.   Recent Labs: 05/04/2015: ALT 8; BUN 25*; Creatinine 0.7; Hemoglobin 12.6; Platelets 192; Potassium  4.2; Sodium 141    Lipid Panel    Component Value Date/Time   CHOL 214* 05/04/2015   TRIG 92 05/04/2015   HDL 66 05/04/2015   CHOLHDL 4 12/30/2013 0958   VLDL 25.0 12/30/2013 0958   LDLCALC 129 05/04/2015   LDLDIRECT 118.7 07/20/2011 0958     Lab Results  Component Value Date   CHOL 214* 05/04/2015   TRIG 92 05/04/2015   HDL 66 05/04/2015   LDLCALC 129  05/04/2015   LDLDIRECT 118.7 07/20/2011     Wt Readings from Last 3 Encounters:  06/11/15 128 lb 2 oz (58.117 kg)  04/28/15 127 lb (57.607 kg)  02/11/15 125 lb 3.2 oz (56.79 kg)      Other studies Reviewed: Additional studies/ records that were reviewed today include: Previous Lexiscan Myoview and recent hospitalization. Review of the above records demonstrates:  Please see elsewhere in the note.     ASSESSMENT AND PLAN:  LBBB:  This is chronic. She's not had any symptoms consistent with conduction delay. At this point no change in therapy is indicated.  REDUCED EF:  This was identified on perfusion study only and may or may not be true. I do not see any old echocardiograms. Regardless she has no overt symptoms and would not seem to have any heart failure. Therefore, I don't think further imaging is indicated.  DYSLIPIDEMIA:  We talked about this at length. I reviewed her lipid profile with her. Her LDL is 129 but her HDL was 66. At this point I don't think any indication for taking his statin. In particular I answered her questions about the lack of data around any benefit with memory disorder.  HTN:  The blood pressure is very mildly elevated but I think we can watch this.   (Greater than 40 minutes reviewing all data with greater than 50% face to face with the patient).  Current medicines are reviewed at length with the patient today.  The patient does not have concerns regarding medicines.  The following changes have been made:  no change  Labs/ tests ordered today include: None  No orders of the defined types  were placed in this encounter.     Disposition:   FU with me in six months.     Signed, Minus Breeding, MD  06/11/2015 11:45 AM    [place]

## 2015-06-11 NOTE — Patient Instructions (Signed)

## 2015-06-11 NOTE — Telephone Encounter (Signed)
The alternatives are statins.  The cost may be more right now b/c of her deductible for the new year.  Please notify one of her children about his.

## 2015-06-14 NOTE — Telephone Encounter (Signed)
LMOM to return call.

## 2015-06-23 ENCOUNTER — Ambulatory Visit: Payer: Medicare HMO | Admitting: Cardiology

## 2015-07-14 ENCOUNTER — Non-Acute Institutional Stay: Payer: Medicare HMO | Admitting: Internal Medicine

## 2015-07-14 ENCOUNTER — Encounter: Payer: Self-pay | Admitting: Internal Medicine

## 2015-07-14 VITALS — BP 120/72 | HR 60 | Temp 98.2°F | Ht 60.0 in | Wt 128.0 lb

## 2015-07-14 DIAGNOSIS — E785 Hyperlipidemia, unspecified: Secondary | ICD-10-CM

## 2015-07-14 DIAGNOSIS — R2681 Unsteadiness on feet: Secondary | ICD-10-CM | POA: Diagnosis not present

## 2015-07-14 DIAGNOSIS — C801 Malignant (primary) neoplasm, unspecified: Secondary | ICD-10-CM

## 2015-07-14 DIAGNOSIS — R413 Other amnesia: Secondary | ICD-10-CM

## 2015-07-14 DIAGNOSIS — I10 Essential (primary) hypertension: Secondary | ICD-10-CM | POA: Diagnosis not present

## 2015-07-14 DIAGNOSIS — IMO0002 Reserved for concepts with insufficient information to code with codable children: Secondary | ICD-10-CM

## 2015-07-14 NOTE — Progress Notes (Signed)
Patient ID: Vanessa Mason, female   DOB: 23-Jan-1918, 82 y.o.   MRN: [id number]   Location: [facility] Provider: Franciso Dierks L. Mariea Clonts, D.O., C.M.D.  Code Status: DNR Goals of Care: Advanced Directive information Does patient have an advance directive?: Yes, Type of Advance Directive: [place];Living will;Out of facility DNR (pink MOST or yellow form), Pre-existing out of facility DNR order (yellow form or pink MOST form): Yellow form placed in chart (order not valid for inpatient use)  Chief Complaint  Patient presents with  . Medical Management of Chronic Issues    medication management, blood pressure, depression    HPI: Patient is a 82 y.o. female seen in the office today for med mgt of chronic diseases.    BP:  At goal today.  Continues on losartan.  Asks about mammograms:  Discussed guidelines recommend only to 89 and most ladies her age stop mammograms.  What matters is if she would accept or tolerate treatment.  I advised against continuing these at this point.    Says she's not going to take zetia for her cholesterol due to cost of $40.   Loves Dr. Percival Spanish, her cardiologist now.  No changes were made at that visit.  Reviewed with her today.    Went to Dr. Denna Haggard, dermatology.  She had 4 biopsies 06/09/14.   Right leg medial to graft has excoriated area.  Left leg two areas where biopsies performed are raised and irritated.    She reports she is using a walker, but left it in the car today instead of bringing it into clinic with her.  Says b/c it's raining.  Reinforced importance to prevent falling.  Review of Systems:  Review of Systems  Constitutional: Negative for fever and chills.  HENT: Positive for hearing loss.   Eyes:       Glasses  Respiratory: Negative for shortness of breath.   Cardiovascular: Negative for chest pain and palpitations.  Musculoskeletal: Negative for myalgias and falls.       Unsteady gait  Skin: Negative for itching  and rash.       Multiple sites of squamous cell ca--right shoulder, right chest left lower leg  Neurological: Negative for dizziness.  Psychiatric/Behavioral: Positive for memory loss.    Past Medical History  Diagnosis Date  . BACK PAIN 03/08/2007  . DEPRESSIVE DISORDER 12/01/2008  . HYPERLIPIDEMIA 03/09/2006  . HYPERTENSION 03/09/2006  . KNEE PAIN 05/06/2009  . GI bleed 09/12/2010    Naproxen induced Endoscopy with dr Harlene Ramus   . Urinary urgency   . HIATAL HERNIA 12/17/2006  . History of hiatal hernia   . Arthritis     "all over"  . Pneumonia     69yrs. ago- hosp. pneumonia  . Cancer (HCC)     squamous cell on R leg & face- ?basal cell  . Duodenal ulcer     Past Surgical History  Procedure Laterality Date  . Abdominal hysterectomy  1975  . Oophorectomy  1989  . Thyroidectomy    . Rotator cuff repair Right 2001    x2  . Arthroscopic knee Right     x 2  . Tubal ligation    . Resection distal clavical    . Cardiac catheterization    . Back surgery  2000    spinal fusion  . Eye surgery      cataracts bilateral - removed  . Hernia repair Bilateral     inguinal   . [place]  moses surgery on R lle  . I&d extremity Right 12/10/2014    Procedure: IRRIGATION AND DEBRIDEMENT ON RIGHT LEG, ;  Surgeon: Theodoro Kos, DO;  Location: [facility];  Service: Plastics;  Laterality: Right;  . Application of a-cell of extremity Right 12/10/2014    Procedure: APPLICATION OF A-CELL OF EXTREMITY;  Surgeon: Theodoro Kos, DO;  Location: [facility];  Service: Plastics;  Laterality: Right;  . I&d extremity Right 01/20/2015    Procedure: IRRIGATION AND DEBRIDEMENT RIGHT LOWER LEG WOUND, with ACELL to Donor Site, SKIN GRAFT AND VAC Placement;  Surgeon: Theodoro Kos, DO;  Location: [facility];  Service: Plastics;  Laterality: Right;  . Tonsillectomy  1928  . Bilateral salpingectomy Bilateral 1989    Dr. Marianna Fuss  . Lumbar laminectomy  02-17-1999    with sinal fusion L3,4,5,&S1    Allergies  Allergen Reactions   . Naproxen     Gi bleed  . Nsaids     GI BLEED  . Aspirin     GI bleed  . Ace Inhibitors     Cough   . Caffeine Other (See Comments)    Causes joints to be painful      Medication List       This list is accurate as of: 07/14/15  9:27 AM.  Always use your most recent med list.               acetaminophen 650 MG CR tablet  Commonly known as:  TYLENOL  Take 1 tablet (650 mg total) by mouth as needed (for pain).     alendronate 70 MG tablet  Commonly known as:  FOSAMAX  Take 1 tablet (70 mg total) by mouth every 7 (seven) days. Take with a full glass of water on an empty stomach.     CALCIUM CITRATE PO  Take 150-300 mg by mouth 2 (two) times daily. 300-150 mg take 2 tablets daily     citalopram 10 MG tablet  Commonly known as:  CELEXA  TAKE ONE TABLET BY MOUTH ONCE DAILY     cyclobenzaprine 10 MG tablet  Commonly known as:  FLEXERIL  TAKE ONE-HALF TABLET BY MOUTH AT BEDTIME     Krill Oil 1000 MG Caps  Take 1,000 mg by mouth. Twice daily     losartan 50 MG tablet  Commonly known as:  COZAAR  Take one tablet by mouth once daily for blood pressure     Lutein 20 MG Caps  Take 20 mg by mouth daily.     multivitamin tablet  Take 1 tablet by mouth 2 (two) times daily.     PROBIOTIC DAILY PO  Take 1 tablet by mouth 2 (two) times daily.     Vitamin D3 2000 units Tabs  Take by mouth daily.        Health Maintenance  Topic Date Due  . INFLUENZA VACCINE  12/28/2015  . TETANUS/TDAP  12/12/2022  . DEXA SCAN  Completed  . ZOSTAVAX  Addressed  . PNA vac Low Risk Adult  Completed    Physical Exam: Filed Vitals:   07/14/15 0910  BP: 120/72  Pulse: 60  Temp: 98.2 F (36.8 C)  TempSrc: Oral  Height: 5' (1.524 m)  Weight: 128 lb (58.06 kg)  SpO2: 98%   Body mass index is 25 kg/(m^2). Physical Exam  Constitutional: She appears well-developed and well-nourished. No distress.  Cardiovascular: Normal rate, regular rhythm and normal heart sounds.    Pulmonary/Chest: Effort normal and breath sounds  normal. No respiratory distress.  Musculoskeletal:  Unsteady gait, swings right leg outward; not using her walker today  Neurological: She is alert.  Short term memory loss--forgets topic midway, repeats herself some  Skin: Skin is warm and dry.  Patchy areas of raised erythema (2 on left shin) and two small excoriations medial to her graft on right distal shin also  Psychiatric: She has a normal mood and affect.    Labs reviewed: Basic Metabolic Panel:  Recent Labs  12/01/14 0415 12/08/14 1540 01/20/15 1109 05/04/15  NA 139 138 139 141  K 3.9 3.8 4.0 4.2  CL 99* 102 103  --   CO2 32 28 28  --   GLUCOSE 97 106* 92  --   BUN 15 21* 23* 25*  CREATININE 0.66 0.65 0.61 0.7  CALCIUM 9.1 9.3 9.1  --    Liver Function Tests:  Recent Labs  11/12/14 1207 05/04/15  AST 23 16  ALT 13* 8  ALKPHOS 62 59  BILITOT 0.9  --   PROT 7.1  --   ALBUMIN 4.2  --    No results for input(s): LIPASE, AMYLASE in the last 8760 hours. No results for input(s): AMMONIA in the last 8760 hours. CBC:  Recent Labs  11/12/14 1207 11/27/14 1906 12/01/14 0415 12/08/14 1540 01/20/15 1109 05/04/15  WBC 8.7 12.3* 7.4 9.5 5.9 7.5  NEUTROABS 5.5 9.5*  --   --   --   --   HGB 13.3 12.4 11.0* 11.6* 11.6* 12.6  HCT 41.0 38.5 34.2* 35.5* 34.5* 38  MCV 90.7 92.5 91.2 91.0 89.4  --   PLT 217 222 306 295 192 192   Lipid Panel:  Recent Labs  05/04/15  CHOL 214*  HDL 66  LDLCALC 129  TRIG 92   No results found for: HGBA1C  Procedures since last visit: Reviewed pathology report from Dr. Denna Haggard 06/10/15  Assessment/Plan 1. Squamous cell carcinoma (HCC) -multiple locations -discussed potential treatments I am aware of including surgery, topical creams and doing nothing -given her prior mohs surgery and subsequent MRSA infection requiring a wound vac and grafting, would expect she would not want surgery, but she is not opposed to it (does have  cognitive impairment) -advised to discuss with her family--recommend family go to appt  2. Memory loss, short term -notable during visits -needs a family member to attend with her  3. Essential hypertension -bp well controlled with arb only  4. Hyperlipidemia -off meds due to cost and side effects with statins, plus she's 89 with cognitive impairment  5. Unsteady gait -cont use of walker, but use all of the time, not just sometimes to prevent falls  Next appt:  3 mos med mgt  Juandedios Dudash L. Allanna Bresee, D.O. [facility] [address], [place] [address] Cell Phone (Mon-Fri 8am-5pm):  [phone] On Call:  [phone] & follow prompts after 5pm & weekends Office Phone:  [phone] Office Fax:  [phone]

## 2015-07-14 NOTE — Patient Instructions (Addendum)
Recommend you return to Dr. Denna Haggard and bring one of your children to also hear your options for treatment of the multiple areas of skin cancer on your left leg, right shoulder and right chest.   I don't recommend mammograms anymore at your age due to risks of the treatments.  You had your prevnar and will not need that pneumonia shot again.  Dr Percival Spanish and myself are ok with you not taking zetia.

## 2015-10-04 ENCOUNTER — Other Ambulatory Visit: Payer: Self-pay | Admitting: *Deleted

## 2015-10-04 DIAGNOSIS — I10 Essential (primary) hypertension: Secondary | ICD-10-CM

## 2015-10-04 MED ORDER — LOSARTAN POTASSIUM 50 MG PO TABS: ORAL_TABLET | ORAL | Status: DC

## 2015-10-04 MED ORDER — CYCLOBENZAPRINE HCL 10 MG PO TABS: 5.0000 mg | ORAL_TABLET | Freq: Every day | ORAL | Status: DC

## 2015-10-04 MED ORDER — CITALOPRAM HYDROBROMIDE 10 MG PO TABS: 10.0000 mg | ORAL_TABLET | Freq: Every day | ORAL | Status: DC

## 2015-10-04 NOTE — Telephone Encounter (Signed)
Patient requested and faxed Rx to pharmacy.  

## 2015-10-06 ENCOUNTER — Non-Acute Institutional Stay: Payer: Medicare HMO | Admitting: Internal Medicine

## 2015-10-06 ENCOUNTER — Encounter: Payer: Self-pay | Admitting: Internal Medicine

## 2015-10-06 VITALS — BP 138/70 | HR 93 | Temp 98.6°F | Ht 60.0 in | Wt 126.0 lb

## 2015-10-06 DIAGNOSIS — F32A Depression, unspecified: Secondary | ICD-10-CM

## 2015-10-06 DIAGNOSIS — F329 Major depressive disorder, single episode, unspecified: Secondary | ICD-10-CM

## 2015-10-06 DIAGNOSIS — R05 Cough: Secondary | ICD-10-CM

## 2015-10-06 DIAGNOSIS — I10 Essential (primary) hypertension: Secondary | ICD-10-CM | POA: Diagnosis not present

## 2015-10-06 DIAGNOSIS — R059 Cough, unspecified: Secondary | ICD-10-CM

## 2015-10-06 DIAGNOSIS — K429 Umbilical hernia without obstruction or gangrene: Secondary | ICD-10-CM | POA: Diagnosis not present

## 2015-10-06 DIAGNOSIS — R413 Other amnesia: Secondary | ICD-10-CM

## 2015-10-06 NOTE — Progress Notes (Signed)
Location:  Occupational psychologist of Service:  Clinic (12)  Provider: Kessie Croston L. Mariea Clonts, D.O., C.M.D.  Code Status: DNR Goals of Care:  Advanced Directives 10/06/2015  Does patient have an advance directive? Yes  Type of Paramedic of [place];Living will;Out of facility DNR (pink MOST or yellow form)  Copy of advanced directive(s) in chart? Yes  Pre-existing out of facility DNR order (yellow form or pink MOST form) Yellow form placed in chart (order not valid for inpatient use)   Chief Complaint  Patient presents with  . Medical Management of Chronic Issues    3 mth follow-up    HPI: Patient is a 82 y.o. female seen today for medical management of chronic diseases.   Discussed importance of hydration.    Had a dry cough one evening and had laryngitis for 3 days.  Still has some intermittently.  Now coughing up some junk in the morning.  No fever, stuffy nose.  Had a little soreness of her throat.     Says she called for celexa and they said she was not due and only gave her 11 pills.  Mood has been good with it and renewal was placed Monday.    Memory loss:  When she visited her daughter in [place], she did wonderfully until the end when she became "befuddled".  Had been sedentary all week.  Left her list of concerns at home.  Word finding is driving her nuts.    Umbilical hernia is getting bigger.  Still reducible.    Past Medical History  Diagnosis Date  . BACK PAIN 03/08/2007  . DEPRESSIVE DISORDER 12/01/2008  . HYPERLIPIDEMIA 03/09/2006  . HYPERTENSION 03/09/2006  . KNEE PAIN 05/06/2009  . GI bleed 09/12/2010    Naproxen induced Endoscopy with dr Harlene Ramus   . Urinary urgency   . HIATAL HERNIA 12/17/2006  . History of hiatal hernia   . Arthritis     "all over"  . Pneumonia     71yrs. ago- hosp. pneumonia  . Cancer (HCC)     squamous cell on R leg & face- ?basal cell  . Duodenal ulcer     Past Surgical History  Procedure  Laterality Date  . Abdominal hysterectomy  1975  . Oophorectomy  1989  . Thyroidectomy    . Rotator cuff repair Right 2001    x2  . Arthroscopic knee Right     x 2  . Tubal ligation    . Resection distal clavical    . Cardiac catheterization    . Back surgery  2000    spinal fusion  . Eye surgery      cataracts bilateral - removed  . Hernia repair Bilateral     inguinal   . [place]      moses surgery on R lle  . I&d extremity Right 12/10/2014    Procedure: IRRIGATION AND DEBRIDEMENT ON RIGHT LEG, ;  Surgeon: Theodoro Kos, DO;  Location: [facility];  Service: Plastics;  Laterality: Right;  . Application of a-cell of extremity Right 12/10/2014    Procedure: APPLICATION OF A-CELL OF EXTREMITY;  Surgeon: Theodoro Kos, DO;  Location: [facility];  Service: Plastics;  Laterality: Right;  . I&d extremity Right 01/20/2015    Procedure: IRRIGATION AND DEBRIDEMENT RIGHT LOWER LEG WOUND, with ACELL to Donor Site, SKIN GRAFT AND VAC Placement;  Surgeon: Theodoro Kos, DO;  Location: [facility];  Service: Plastics;  Laterality: Right;  . Tonsillectomy  1928  .  Bilateral salpingectomy Bilateral 1989    Dr. Marianna Fuss  . Lumbar laminectomy  02-17-1999    with sinal fusion L3,4,5,&S1    Allergies  Allergen Reactions  . Naproxen     Gi bleed  . Nsaids     GI BLEED  . Aspirin     GI bleed  . Ace Inhibitors     Cough   . Caffeine Other (See Comments)    Causes joints to be painful      Medication List       This list is accurate as of: 10/06/15  9:17 AM.  Always use your most recent med list.               acetaminophen 650 MG CR tablet  Commonly known as:  TYLENOL  Take 1 tablet (650 mg total) by mouth as needed (for pain).     alendronate 70 MG tablet  Commonly known as:  FOSAMAX  Take 1 tablet (70 mg total) by mouth every 7 (seven) days. Take with a full glass of water on an empty stomach.     CALCIUM CITRATE PO  Take 150-300 mg by mouth 2 (two) times daily. 300-150 mg take 2 tablets daily      citalopram 10 MG tablet  Commonly known as:  CELEXA  Take 1 tablet (10 mg total) by mouth daily.     cyclobenzaprine 10 MG tablet  Commonly known as:  FLEXERIL  Take 0.5 tablets (5 mg total) by mouth at bedtime.     Krill Oil 1000 MG Caps  Take 1,000 mg by mouth. Twice daily     losartan 50 MG tablet  Commonly known as:  COZAAR  Take one tablet by mouth once daily for blood pressure     Lutein 20 MG Caps  Take 20 mg by mouth daily.     multivitamin tablet  Take 1 tablet by mouth 2 (two) times daily.     Vitamin D3 2000 units Tabs  Take by mouth daily.        Review of Systems:  Review of Systems  Constitutional: Negative for fever, chills and malaise/fatigue.  HENT: Positive for congestion and hearing loss.        Hearing aides  Eyes: Negative for blurred vision.  Respiratory: Positive for cough. Negative for sputum production and shortness of breath.   Cardiovascular: Negative for chest pain, palpitations and leg swelling.  Gastrointestinal: Negative for abdominal pain and constipation.  Genitourinary: Negative for dysuria.  Musculoskeletal: Positive for falls.       Once on spongy ground going downhill  Skin: Negative for rash.  Neurological: Negative for dizziness, loss of consciousness and weakness.  Psychiatric/Behavioral: Positive for memory loss. Negative for depression.    Health Maintenance  Topic Date Due  . INFLUENZA VACCINE  12/28/2015  . TETANUS/TDAP  12/12/2022  . DEXA SCAN  Completed  . ZOSTAVAX  Addressed  . PNA vac Low Risk Adult  Completed    Physical Exam: Filed Vitals:   10/06/15 0853  BP: 138/70  Pulse: 93  Temp: 98.6 F (37 C)  TempSrc: Oral  Height: 5' (1.524 m)  Weight: 126 lb (57.153 kg)  SpO2: 94%   Body mass index is 24.61 kg/(m^2). Physical Exam  Constitutional: She is oriented to person, place, and time. She appears well-developed and well-nourished. No distress.  Cardiovascular: Normal rate and regular rhythm.    Pulmonary/Chest: Effort normal and breath sounds normal. No respiratory distress.  Abdominal: Soft.  Bowel sounds are normal. She exhibits no distension. There is no tenderness.  Musculoskeletal: Normal range of motion.  Neurological: She is alert and oriented to person, place, and time.  Word-finding difficulty  Psychiatric: She has a normal mood and affect.    Labs reviewed: Basic Metabolic Panel:  Recent Labs  12/01/14 0415 12/08/14 1540 01/20/15 1109 05/04/15  NA 139 138 139 141  K 3.9 3.8 4.0 4.2  CL 99* 102 103  --   CO2 32 28 28  --   GLUCOSE 97 106* 92  --   BUN 15 21* 23* 25*  CREATININE 0.66 0.65 0.61 0.7  CALCIUM 9.1 9.3 9.1  --    Liver Function Tests:  Recent Labs  11/12/14 1207 05/04/15  AST 23 16  ALT 13* 8  ALKPHOS 62 59  BILITOT 0.9  --   PROT 7.1  --   ALBUMIN 4.2  --    No results for input(s): LIPASE, AMYLASE in the last 8760 hours. No results for input(s): AMMONIA in the last 8760 hours. CBC:  Recent Labs  11/12/14 1207 11/27/14 1906 12/01/14 0415 12/08/14 1540 01/20/15 1109 05/04/15  WBC 8.7 12.3* 7.4 9.5 5.9 7.5  NEUTROABS 5.5 9.5*  --   --   --   --   HGB 13.3 12.4 11.0* 11.6* 11.6* 12.6  HCT 41.0 38.5 34.2* 35.5* 34.5* 38  MCV 90.7 92.5 91.2 91.0 89.4  --   PLT 217 222 306 295 192 192   Lipid Panel:  Recent Labs  05/04/15  CHOL 214*  HDL 66  LDLCALC 129  TRIG 92   Assessment/Plan 1. Cough -seems like it's allergic in origin -if not improving, will consider changing bp meds  2. Essential hypertension -bp at goal, cont same regimen  3. Depression -in remission with celexa  4. Memory loss, short term -has been stable lately -cont to monitor, has a lot of family support and uses pillbox  5. Umbilical hernia, recurrence not specified -is growing in size, but still reducible and not painful -given instructions on when this urgent/emergent  Labs/tests ordered:  Cbc, bmp before Next appt:  4 mos, med mgt with lab  before  [place] L. Caraline Deutschman, D.O. [facility] [address], [place] [address] Cell Phone (Mon-Fri 8am-5pm):  [phone] On Call:  [phone] & follow prompts after 5pm & weekends Office Phone:  [phone] Office Fax:  [phone]

## 2015-10-13 ENCOUNTER — Encounter: Payer: Self-pay | Admitting: Internal Medicine

## 2015-11-02 ENCOUNTER — Other Ambulatory Visit: Payer: Self-pay

## 2015-11-02 MED ORDER — CITALOPRAM HYDROBROMIDE 10 MG PO TABS: 10.0000 mg | ORAL_TABLET | Freq: Every day | ORAL | Status: DC

## 2015-12-27 ENCOUNTER — Other Ambulatory Visit: Payer: Self-pay | Admitting: *Deleted

## 2015-12-27 DIAGNOSIS — I10 Essential (primary) hypertension: Secondary | ICD-10-CM

## 2015-12-27 MED ORDER — LOSARTAN POTASSIUM 50 MG PO TABS: ORAL_TABLET | ORAL | 2 refills | Status: DC

## 2015-12-27 NOTE — Telephone Encounter (Signed)
Patient requested refill to be faxed to pharmacy.  

## 2016-01-05 ENCOUNTER — Other Ambulatory Visit: Payer: Self-pay

## 2016-01-05 DIAGNOSIS — M810 Age-related osteoporosis without current pathological fracture: Secondary | ICD-10-CM

## 2016-01-05 MED ORDER — ALENDRONATE SODIUM 70 MG PO TABS: 70.0000 mg | ORAL_TABLET | ORAL | 3 refills | Status: DC

## 2016-01-20 ENCOUNTER — Encounter: Payer: Self-pay | Admitting: Cardiology

## 2016-02-01 LAB — BASIC METABOLIC PANEL
BUN: 22 mg/dL — AB (ref 4–21)
Creatinine: 0.5 mg/dL (ref 0.5–1.1)
Glucose: 90 mg/dL
Potassium: 4.4 mmol/L (ref 3.4–5.3)
Sodium: 141 mmol/L (ref 137–147)

## 2016-02-01 NOTE — Progress Notes (Signed)
Cardiology Office Note   Date:  02/03/2016   ID:  Vanessa Mason, DOB [birth year], MRN [id number]  PCP:  Vanessa Kinnier, DO  Cardiologist:   Minus Breeding, MD   No chief complaint on file.     History of Present Illness: Vanessa Mason is a 82 y.o. female who presents for a second visit with me. She previously saw Dr. Mare Mason.  She has a history of left bundle branch block. I see a couple of old stress tests.  The most recent was 2015 prior to a possible knee surgery which she never had. This was a negative stress perfusion study. She's had left bundle branch block for some time.   Since I last saw her she is done well. She is limited by knee pain. She's not describing chest pressure, neck or arm discomfort. She's not having any palpitations, presyncope or syncope. She's not having any shortness of breath, PND or orthopnea. Her blood pressures been well controlled.  Past Medical History:  Diagnosis Date  . Arthritis    "all over"  . BACK PAIN 03/08/2007  . 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 DEPRESSIVE DISORDER 12/01/2008  . Duodenal ulcer   . GI bleed 09/12/2010   Naproxen induced Endoscopy with dr Vanessa Mason   . HIATAL HERNIA 12/17/2006  . History of hiatal hernia   . HYPERLIPIDEMIA 03/09/2006  . HYPERTENSION 03/09/2006  . KNEE PAIN 05/06/2009  . Pneumonia    50yrs. ago- hosp. pneumonia  . Urinary urgency     Past Surgical History:  Procedure Laterality Date  . ABDOMINAL HYSTERECTOMY  1975  . APPLICATION OF A-CELL OF EXTREMITY Right 12/10/2014   Procedure: APPLICATION OF A-CELL OF EXTREMITY;  Surgeon: Vanessa Kos, DO;  Location: [facility];  Service: Plastics;  Laterality: Right;  . arthroscopic knee Right    x 2  . BACK SURGERY  2000   spinal fusion  . BILATERAL SALPINGECTOMY Bilateral 1989   Dr. Marianna Mason  . CARDIAC CATHETERIZATION    . EYE SURGERY     cataracts bilateral - removed  . HERNIA REPAIR Bilateral    inguinal   . I&D  EXTREMITY Right 12/10/2014   Procedure: IRRIGATION AND DEBRIDEMENT ON RIGHT LEG, ;  Surgeon: Vanessa Kos, DO;  Location: [facility];  Service: Plastics;  Laterality: Right;  . I&D EXTREMITY Right 01/20/2015   Procedure: IRRIGATION AND DEBRIDEMENT RIGHT LOWER LEG WOUND, with ACELL to Donor Site, SKIN GRAFT AND VAC Placement;  Surgeon: Vanessa Kos, DO;  Location: [facility];  Service: Plastics;  Laterality: Right;  . LUMBAR LAMINECTOMY  02-17-1999   with sinal fusion L3,4,5,&S1  . moes     moses surgery on R lle  . OOPHORECTOMY  1989  . RESECTION DISTAL CLAVICAL    . ROTATOR CUFF REPAIR Right 2001   x2  . THYROIDECTOMY    . TONSILLECTOMY  1928  . TUBAL LIGATION       Current Outpatient Prescriptions  Medication Sig Dispense Refill  . acetaminophen (TYLENOL) 650 MG CR tablet Take 1 tablet (650 mg total) by mouth as needed (for pain). 30 tablet 0  . alendronate (FOSAMAX) 70 MG tablet Take 1 tablet (70 mg total) by mouth every 7 (seven) days. Take with a full glass of water on an empty stomach. 12 tablet 3  . CALCIUM CITRATE PO Take 150-300 mg by mouth 2 (two) times daily. 300-150 mg take 2 tablets daily    .  Cholecalciferol (VITAMIN D3) 2000 UNITS TABS Take by mouth daily.    . citalopram (CELEXA) 10 MG tablet Take 1 tablet (10 mg total) by mouth daily. 90 tablet 1  . cyclobenzaprine (FLEXERIL) 10 MG tablet Take 0.5 tablets (5 mg total) by mouth at bedtime. 45 tablet 1  . KRILL OIL 1000 MG CAPS Take 1,000 mg by mouth. Twice daily    . losartan (COZAAR) 50 MG tablet Take one tablet by mouth once daily for blood pressure 90 tablet 2  . Lutein 20 MG CAPS Take 20 mg by mouth daily.     . Multiple Vitamin (MULTIVITAMIN) tablet Take 1 tablet by mouth 2 (two) times daily.      No current facility-administered medications for this visit.     Allergies:   Naproxen; Nsaids; Aspirin; Ace inhibitors; and Caffeine    ROS:  Please see the history of present illness.   Otherwise, review of systems are  positive for none.   All other systems are reviewed and negative.    PHYSICAL EXAM: VS:  BP 138/74   Pulse 92   Ht 5' (1.524 m)   Wt 128 lb (58.1 kg)   BMI 25.00 kg/m  , BMI Body mass index is 25 kg/m. GENERAL:  Well appearing HEENT:  Pupils equal round and reactive, fundi not visualized, oral mucosa unremarkable NECK:  No jugular venous distention, waveform within normal limits, carotid upstroke brisk and symmetric, no bruits, no thyromegaly LUNGS:  Clear to auscultation bilaterally CHEST:  Unremarkable HEART:  PMI not displaced or sustained,S1 and S2 within normal limits, no S3, no S4, no clicks, no rubs, no murmurs ABD:  Flat, positive bowel sounds normal in frequency in pitch, no bruits, no rebound, no guarding, no midline pulsatile mass, no hepatomegaly, no splenomegaly EXT:  2 plus pulses throughout, no edema, no cyanosis no clubbing   EKG:  EKG is  ordered today. The ekg ordered Sinus rhythm, rate 92, left bundle branch block, left axis deviation.   Recent Labs: 05/04/2015: ALT 8; BUN 25; Creatinine 0.7; Hemoglobin 12.6; Platelets 192; Potassium 4.2; Sodium 141    Lipid Panel    Component Value Date/Time   CHOL 214 (A) 05/04/2015   TRIG 92 05/04/2015   HDL 66 05/04/2015   CHOLHDL 4 12/30/2013 0958   VLDL 25.0 12/30/2013 0958   LDLCALC 129 05/04/2015   LDLDIRECT 118.7 07/20/2011 0958     Lab Results  Component Value Date   CHOL 214 (A) 05/04/2015   TRIG 92 05/04/2015   HDL 66 05/04/2015   LDLCALC 129 05/04/2015   LDLDIRECT 118.7 07/20/2011     Wt Readings from Last 3 Encounters:  02/03/16 128 lb (58.1 kg)  10/06/15 126 lb (57.2 kg)  07/14/15 128 lb (58.1 kg)      Other studies Reviewed: Additional studies/ records that were reviewed today include:  None Review of the above records demonstrates:     ASSESSMENT AND PLAN:  LBBB:  This is chronic. She's not had any symptoms consistent with conduction delay. At this point no change in therapy is  indicated.  REDUCED EF:  This was identified on perfusion study only and may or may not be true. I do not see any old echocardiograms. Regardless she has no overt symptoms and would not seem to have any heart failure. Therefore, I don't think further imaging is indicated.  DYSLIPIDEMIA:  We talked about this at length previously. I reviewed her lipid profile with her. Her LDL is 129  but her HDL was 66. At this point I don't think any indication for taking his statin.   HTN:  The blood pressure is at target. No change in medications is indicated. We will continue with therapeutic lifestyle changes (TLC).  Current medicines are reviewed at length with the patient today.  The patient does not have concerns regarding medicines.  The following changes have been made:  None  Labs/ tests ordered today include: No orders of the defined types were placed in this encounter.    Disposition:   FU with me in 12 months.     Signed, Minus Breeding, MD  02/03/2016 1:27 PM    [facility]

## 2016-02-03 ENCOUNTER — Encounter: Payer: Self-pay | Admitting: Cardiology

## 2016-02-03 ENCOUNTER — Ambulatory Visit (INDEPENDENT_AMBULATORY_CARE_PROVIDER_SITE_OTHER): Payer: Medicare HMO | Admitting: Cardiology

## 2016-02-03 VITALS — BP 138/74 | HR 92 | Ht 60.0 in | Wt 128.0 lb

## 2016-02-03 DIAGNOSIS — I447 Left bundle-branch block, unspecified: Secondary | ICD-10-CM | POA: Diagnosis not present

## 2016-02-03 DIAGNOSIS — I10 Essential (primary) hypertension: Secondary | ICD-10-CM | POA: Diagnosis not present

## 2016-02-03 NOTE — Patient Instructions (Signed)
Your physician wants you to follow-up in: 1 Year. You will receive a reminder letter in the mail two months in advance. If you don't receive a letter, please call our office to schedule the follow-up appointment.  

## 2016-02-08 ENCOUNTER — Encounter: Payer: Self-pay | Admitting: Internal Medicine

## 2016-02-09 ENCOUNTER — Non-Acute Institutional Stay: Payer: Medicare HMO | Admitting: Internal Medicine

## 2016-02-09 ENCOUNTER — Encounter: Payer: Self-pay | Admitting: Internal Medicine

## 2016-02-09 VITALS — BP 118/70 | HR 82 | Temp 98.8°F | Ht 60.0 in | Wt 128.0 lb

## 2016-02-09 DIAGNOSIS — C801 Malignant (primary) neoplasm, unspecified: Secondary | ICD-10-CM

## 2016-02-09 DIAGNOSIS — K429 Umbilical hernia without obstruction or gangrene: Secondary | ICD-10-CM | POA: Diagnosis not present

## 2016-02-09 DIAGNOSIS — M810 Age-related osteoporosis without current pathological fracture: Secondary | ICD-10-CM | POA: Diagnosis not present

## 2016-02-09 DIAGNOSIS — I10 Essential (primary) hypertension: Secondary | ICD-10-CM

## 2016-02-09 DIAGNOSIS — M159 Polyosteoarthritis, unspecified: Secondary | ICD-10-CM

## 2016-02-09 DIAGNOSIS — M8949 Other hypertrophic osteoarthropathy, multiple sites: Secondary | ICD-10-CM

## 2016-02-09 DIAGNOSIS — M150 Primary generalized (osteo)arthritis: Secondary | ICD-10-CM

## 2016-02-09 DIAGNOSIS — F329 Major depressive disorder, single episode, unspecified: Secondary | ICD-10-CM | POA: Diagnosis not present

## 2016-02-09 DIAGNOSIS — R413 Other amnesia: Secondary | ICD-10-CM

## 2016-02-09 DIAGNOSIS — F32A Depression, unspecified: Secondary | ICD-10-CM

## 2016-02-09 DIAGNOSIS — IMO0002 Reserved for concepts with insufficient information to code with codable children: Secondary | ICD-10-CM

## 2016-02-09 NOTE — Progress Notes (Signed)
Location:   [place] of Service:  Clinic (12) Provider: Scot Shiraishi L. Mariea Clonts, D.O., C.M.D. Code Status: DNR Goals of Care:  Advanced Directives 02/09/2016  Does patient have an advance directive? Yes  Type of Paramedic of [place];Living will;Out of facility DNR (pink MOST or yellow form)  Does patient want to make changes to advanced directive? -  Copy of advanced directive(s) in chart? Yes  Pre-existing out of facility DNR order (yellow form or pink MOST form) Yellow form placed in chart (order not valid for inpatient use)   Chief Complaint  Patient presents with  . Medical Management of Chronic Issues    4 mth follow-up   HPI: Patient is a 82 y.o. female seen today for medical management of chronic diseases.    Requests a flu shot and has problems with her insurance covering it with the pharmacy here at [facility].  She asks which dose of vitamin D to take.  Advised 2000 units D3 daily.  Asks again about her umbilical hernia--says it annoys her.  It is not painful.  Advised against surgical intervention.  Uses a bedside commode to [place]inate [place]  They use a separate commode for BMs.  Apparently, the urine bucket had a lot of calcium deposits.  We discussed how vitamin D 3 alone is now recommended due to deposits of calcium that can form in blood vessels.  The calcium is not as advantageous for bones at 82 yo.  She had been resistant to a walker for so long.  She is using a fashionable rollator walker now in [place].  She wears her knee brace to prevent falls also.  Is taking her weekly fosamax.    Her spirits have been good and family feels it's helping her.    Still uses muscle relaxer at bedtime.  Takes tylenol if she goes out to lunch or grocery shopping to prevent aches and pains.  Does not use regularly.  Currently, she cannot order more krill oil b/c supplier is out.   Left forth finger is painful at joint and  her knee.  Past Medical History:  Diagnosis Date  . Arthritis    "all over"  . BACK PAIN 03/08/2007  . 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 DEPRESSIVE DISORDER 12/01/2008  . Duodenal ulcer   . GI bleed 09/12/2010   Naproxen induced Endoscopy with dr Harlene Ramus   . HIATAL HERNIA 12/17/2006  . History of hiatal hernia   . HYPERLIPIDEMIA 03/09/2006  . HYPERTENSION 03/09/2006  . KNEE PAIN 05/06/2009  . Pneumonia    46yrs. ago- hosp. pneumonia  . Urinary urgency     Past Surgical History:  Procedure Laterality Date  . ABDOMINAL HYSTERECTOMY  1975  . APPLICATION OF A-CELL OF EXTREMITY Right 12/10/2014   Procedure: APPLICATION OF A-CELL OF EXTREMITY;  Surgeon: Theodoro Kos, DO;  Location: [place];  Service: Plastics;  Laterality: Right;  . arthroscopic knee Right    x 2  . BACK SURGERY  2000   spinal fusion  . BILATERAL SALPINGECTOMY Bilateral 1989   Dr. Marianna Fuss  . CARDIAC CATHETERIZATION    . EYE SURGERY     cataracts bilateral - removed  . HERNIA REPAIR Bilateral    inguinal   . I&D EXTREMITY Right 12/10/2014   Procedure: IRRIGATION AND DEBRIDEMENT ON RIGHT LEG, ;  Surgeon: Theodoro Kos, DO;  Location: [place];  Service: Clinical cytogeneticist;  Laterality: Right;  . I&D EXTREMITY Right 01/20/2015   Procedure: IRRIGATION AND DEBRIDEMENT RIGHT LOWER LEG WOUND, with ACELL to Donor Site, SKIN GRAFT AND VAC Placement;  Surgeon: Theodoro Kos, DO;  Location: [place];  Service: Plastics;  Laterality: Right;  . LUMBAR LAMINECTOMY  02-17-1999   with sinal fusion L3,4,5,&S1  . moes     moses surgery on R lle  . OOPHORECTOMY  1989  . RESECTION DISTAL CLAVICAL    . ROTATOR CUFF REPAIR Right 2001   x2  . THYROIDECTOMY    . TONSILLECTOMY  1928  . TUBAL LIGATION      Allergies  Allergen Reactions  . Naproxen     Gi bleed  . Nsaids     GI BLEED  . Aspirin     GI bleed  . Ace Inhibitors     Cough   . Caffeine Other (See Comments)    Causes joints to be painful       Medication List       Accurate as of 02/09/16  9:37 AM. Always use your most recent med list.          acetaminophen 650 MG CR tablet Commonly known as:  TYLENOL Take 1 tablet (650 mg total) by mouth as needed (for pain).   alendronate 70 MG tablet Commonly known as:  FOSAMAX Take 1 tablet (70 mg total) by mouth every 7 (seven) days. Take with a full glass of water on an empty stomach.   CALCIUM CITRATE PO Take 150-300 mg by mouth 2 (two) times daily. 300-150 mg take 2 tablets daily   citalopram 10 MG tablet Commonly known as:  CELEXA Take 1 tablet (10 mg total) by mouth daily.   cyclobenzaprine 10 MG tablet Commonly known as:  FLEXERIL Take 0.5 tablets (5 mg total) by mouth at bedtime.   Krill Oil 1000 MG Caps Take 1,000 mg by mouth. Twice daily   losartan 50 MG tablet Commonly known as:  COZAAR Take one tablet by mouth once daily for blood pressure   Lutein 20 MG Caps Take 20 mg by mouth daily.   Vitamin D3 2000 units Tabs Take by mouth daily.       Review of Systems:  Review of Systems  Constitutional: Negative for chills, fever, malaise/fatigue and weight loss.  HENT: Positive for hearing loss. Negative for congestion.   Eyes: Negative for blurred vision.       Glasses  Respiratory: Negative for cough and shortness of breath.   Cardiovascular: Negative for chest pain, palpitations and leg swelling.  Gastrointestinal: Negative for abdominal pain, blood in stool, constipation and melena.  Genitourinary: Negative for dysuria.  Musculoskeletal: Negative for falls.  Skin: Negative for itching and rash.  Neurological: Negative for dizziness, loss of consciousness and weakness.  Endo/Heme/Allergies: Bruises/bleeds easily.  Psychiatric/Behavioral: Positive for memory loss. Negative for depression. The patient is not nervous/anxious and does not have insomnia.        Spirits good with celexa    Health Maintenance  Topic Date Due  . INFLUENZA VACCINE   12/28/2015  . TETANUS/TDAP  12/12/2022  . DEXA SCAN  Completed  . ZOSTAVAX  Addressed  . PNA vac Low Risk Adult  Completed    Physical Exam: Vitals:   02/09/16 0934  BP: 118/70  Pulse: 82  Temp: 98.8 F (37.1 C)  TempSrc: Oral  SpO2: 97%  Weight: 128 lb (58.1 kg)  Height: 5' (1.524 m)   Body  mass index is 25 kg/m. Physical Exam  Constitutional: She is oriented to person, place, and time. She appears well-developed and well-nourished. No distress.  Cardiovascular: Normal rate, regular rhythm, normal heart sounds and intact distal pulses.   Pulmonary/Chest: Effort normal and breath sounds normal. No respiratory distress.  Abdominal: Soft. Bowel sounds are normal. She exhibits no distension. There is no tenderness. There is no guarding.  Musculoskeletal: Normal range of motion.  Neurological: She is alert and oriented to person, place, and time.  Short term memory loss--repeats stories visit to visit  Skin: Skin is warm and dry. Capillary refill takes less than 2 seconds.  Psychiatric: She has a normal mood and affect.    Labs reviewed: Basic Metabolic Panel:  Recent Labs  05/04/15 02/01/16 0700  NA 141 141  K 4.2 4.4  BUN 25* 22*  CREATININE 0.7 0.5   Liver Function Tests:  Recent Labs  05/04/15  AST 16  ALT 8  ALKPHOS 59   No results for input(s): LIPASE, AMYLASE in the last 8760 hours. No results for input(s): AMMONIA in the last 8760 hours. CBC:  Recent Labs  05/04/15  WBC 7.5  HGB 12.6  HCT 38  PLT 192   Lipid Panel:  Recent Labs  05/04/15  CHOL 214*  HDL 66  LDLCALC 129  TRIG 92   Assessment/Plan 1. Essential hypertension -bp is well controlled, no changes needed to regimen, she just saw Dr. Percival Spanish and had a normal checkup there also  2. Depression -well controlled with celexa  3. Umbilical hernia, recurrence not specified -is getting larger, but not painful, just annoying--advised against surgery  4. Squamous cell carcinoma  (HCC) -no new areas at her last derm appt, has had many many removed in the past  5. Memory loss, short term -seems pretty stable at this point, did come to appt alone  6. Senile osteoporosis -d/c calcium due to deposits in urine -cont vitamin D, fosamax and weightbearing exercise with walking  7. Primary osteoarthritis involving multiple joints -continue use of tylenol for pain when needed  Labs/tests ordered:  No orders of the defined types were placed in this encounter.  Next appt:  06/08/15 am annual exam/wellness/cpe  Kass Herberger L. Keiston Manley, D.O. [facility] [address], [place] [address] Cell Phone (Mon-Fri 8am-5pm):  [phone] On Call:  [phone] & follow prompts after 5pm & weekends Office Phone:  [phone] Office Fax:  [phone]

## 2016-03-17 IMAGING — CR DG KNEE COMPLETE 4+V*L*
4 series · 4 of 4 positions shown · non-contrast
Comparison: CT left knee 10/28/2014.

CLINICAL DATA: [AGE] who fell and injured the left knee
earlier today. Left knee pain and swelling. Initial encounter.

EXAM:
LEFT KNEE - COMPLETE 4+ VIEW

[t knee ap left]
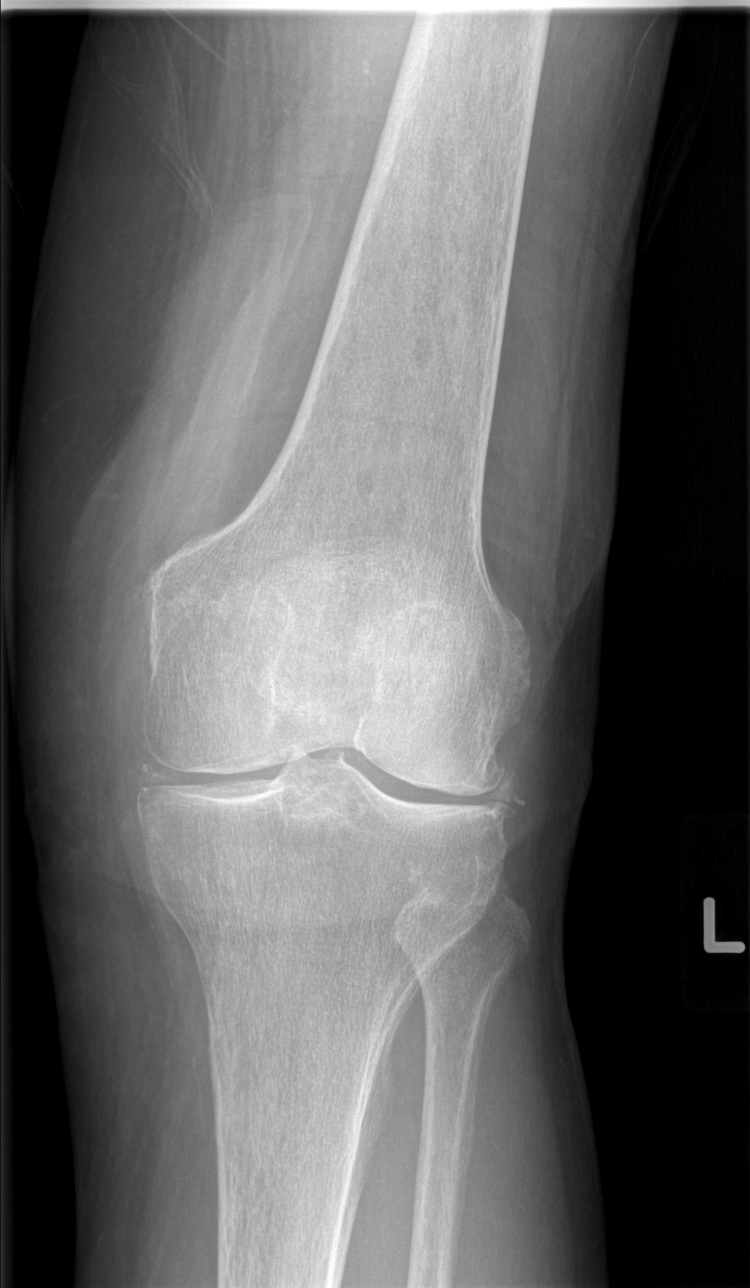

[t knee oblique left (1 of 2)]
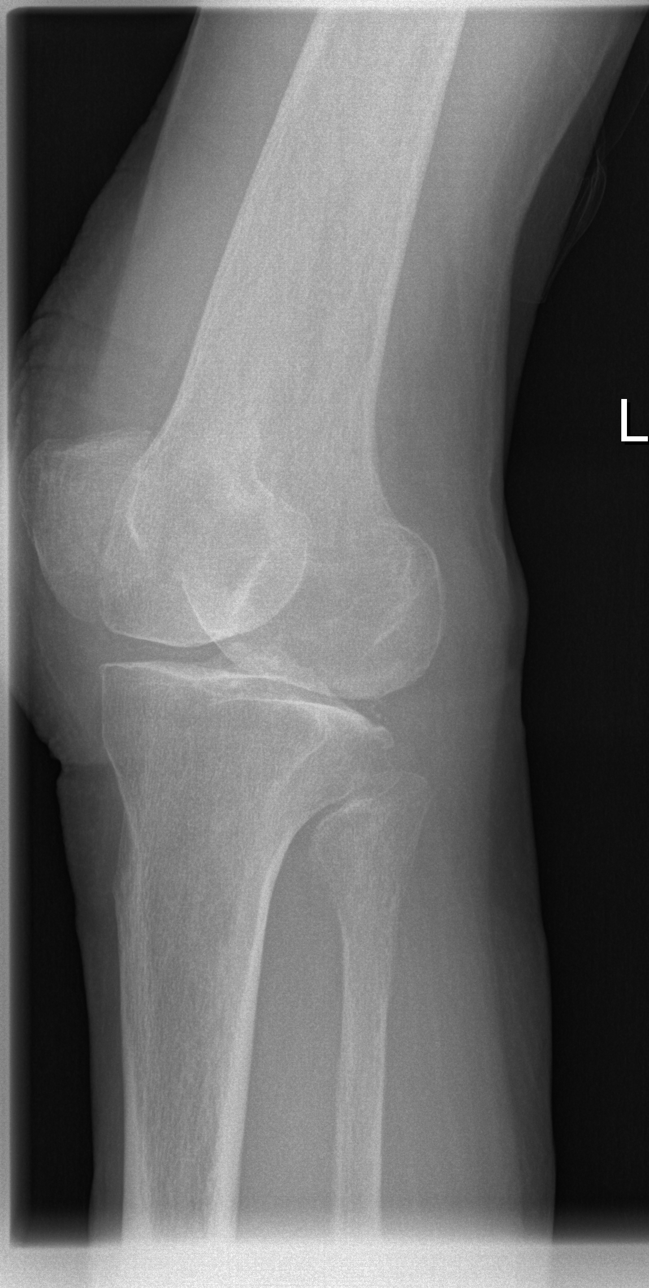

[t knee oblique left (2 of 2)]
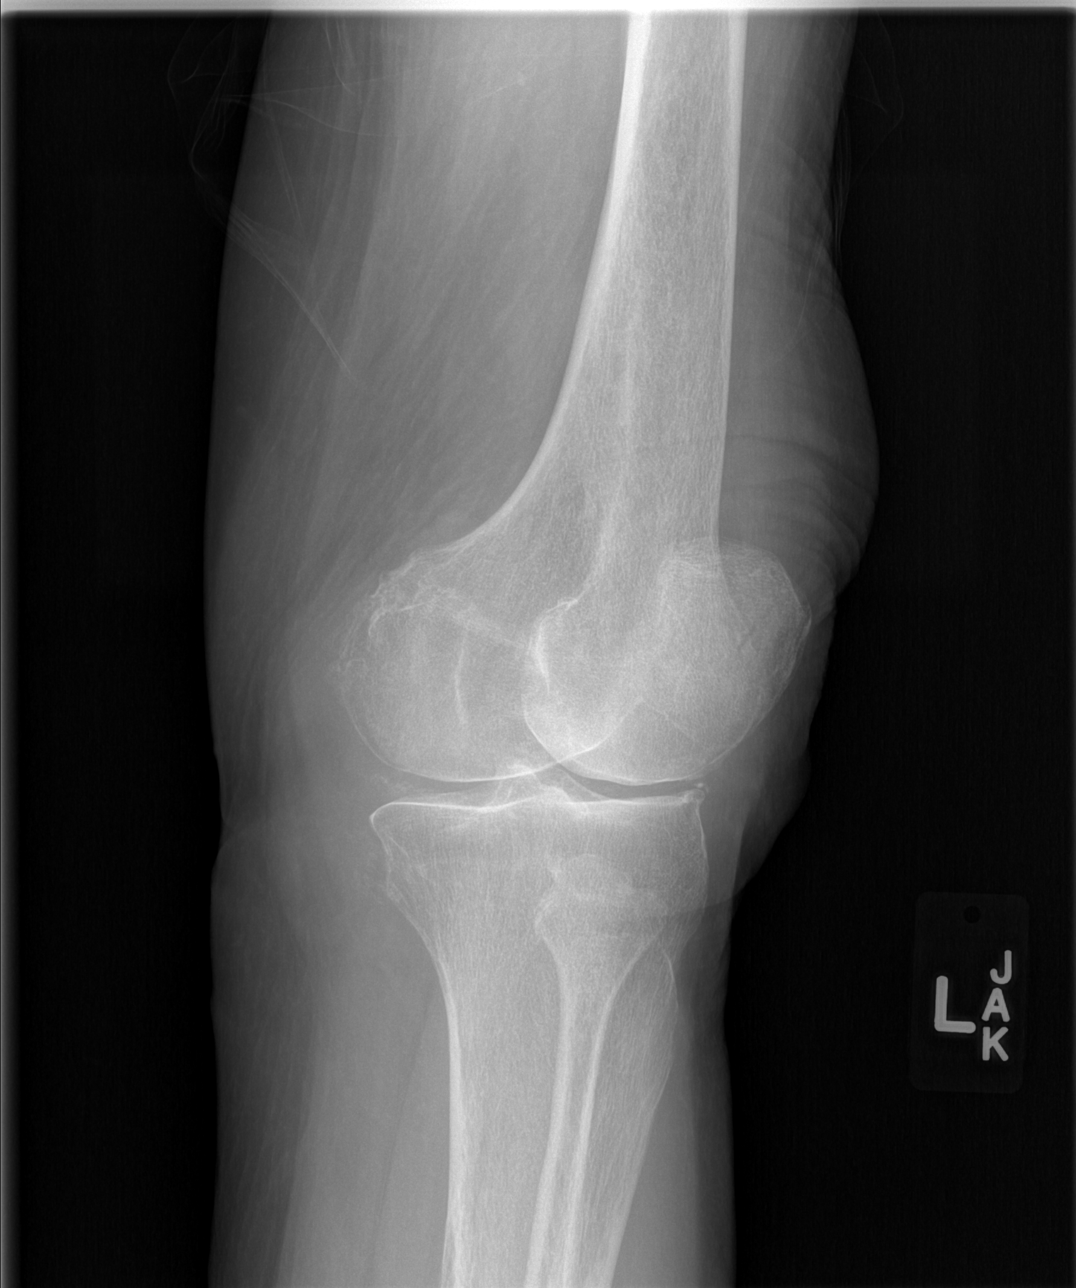

[t knee lat left]
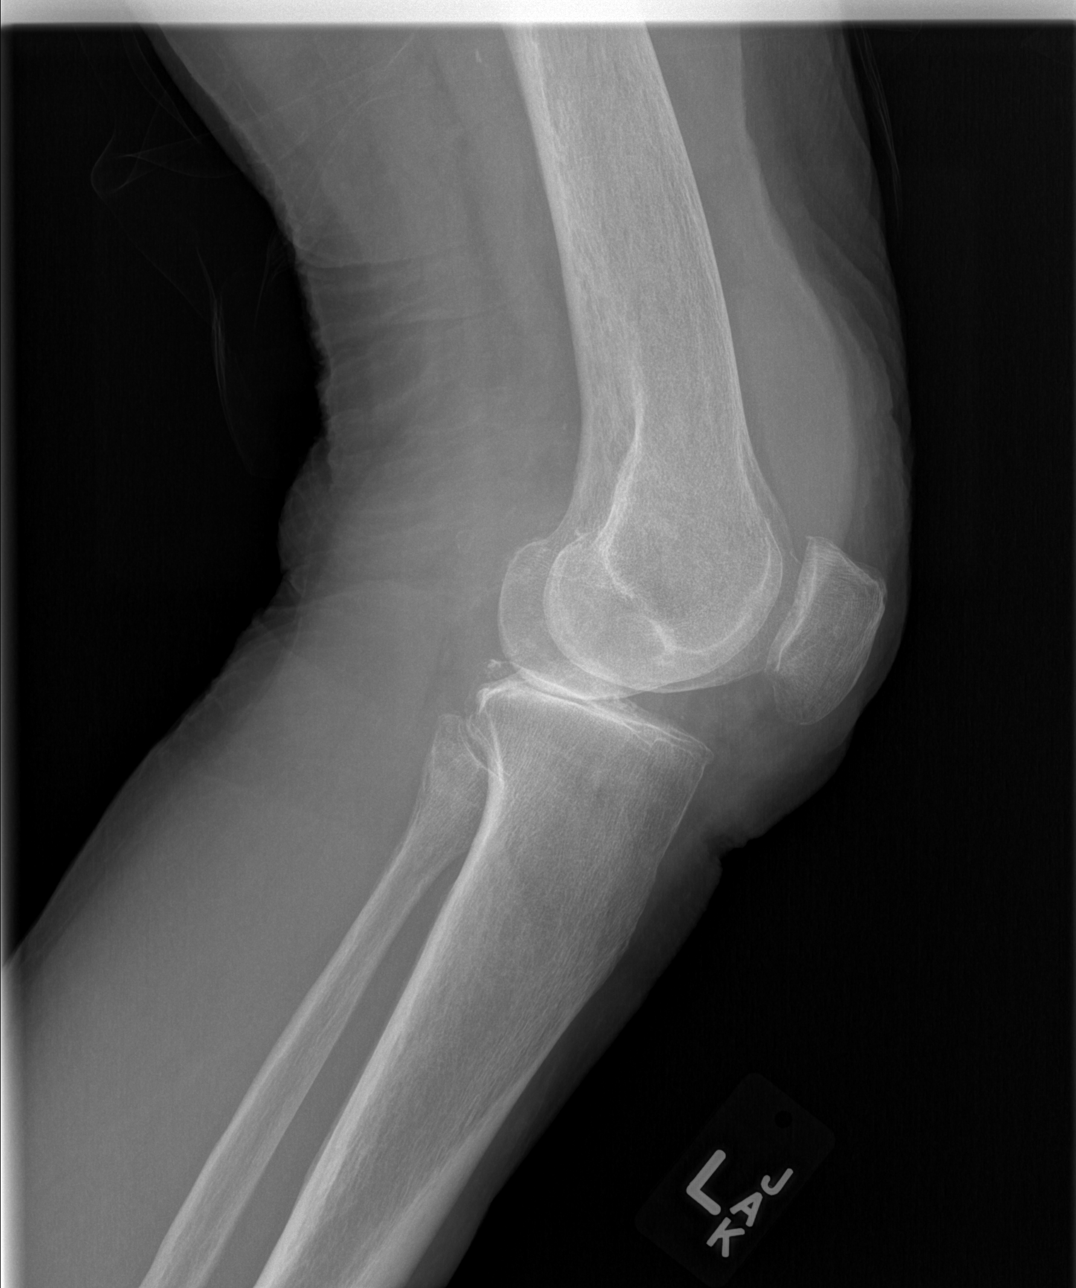

[4 of 4 positions shown; findings below may reference images not displayed]

FINDINGS: No evidence of acute fracture or dislocation. Osseous
demineralization. Moderate tricompartment joint space narrowing and
associated hypertrophic spurring, most prominent in the medial
compartment. Chondrocalcinosis involving the medial and lateral
menisci. Large joint effusion, increased in size since the recent
CT. No fat fluid level on the lateral image.
IMPRESSION: 1. No acute osseous abnormality.
2. Large joint effusion, increased in size since a prior CT
10/28/2014.
[DATE]. Tricompartment osteoarthritis, most prominent in the medial
compartment, secondary to CPPD.
4. Osseous demineralization.

## 2016-05-04 ENCOUNTER — Other Ambulatory Visit: Payer: Self-pay | Admitting: Internal Medicine

## 2016-06-07 ENCOUNTER — Encounter: Payer: Self-pay | Admitting: Internal Medicine

## 2016-06-07 ENCOUNTER — Non-Acute Institutional Stay: Payer: Medicare HMO | Admitting: Internal Medicine

## 2016-06-07 VITALS — BP 158/80 | HR 80 | Temp 98.6°F | Ht 60.0 in | Wt 129.0 lb

## 2016-06-07 DIAGNOSIS — R05 Cough: Secondary | ICD-10-CM

## 2016-06-07 DIAGNOSIS — R2681 Unsteadiness on feet: Secondary | ICD-10-CM

## 2016-06-07 DIAGNOSIS — M150 Primary generalized (osteo)arthritis: Secondary | ICD-10-CM | POA: Diagnosis not present

## 2016-06-07 DIAGNOSIS — Z0000 Encounter for general adult medical examination without abnormal findings: Secondary | ICD-10-CM | POA: Diagnosis not present

## 2016-06-07 DIAGNOSIS — M159 Polyosteoarthritis, unspecified: Secondary | ICD-10-CM

## 2016-06-07 DIAGNOSIS — E785 Hyperlipidemia, unspecified: Secondary | ICD-10-CM

## 2016-06-07 DIAGNOSIS — R413 Other amnesia: Secondary | ICD-10-CM | POA: Diagnosis not present

## 2016-06-07 DIAGNOSIS — M810 Age-related osteoporosis without current pathological fracture: Secondary | ICD-10-CM

## 2016-06-07 DIAGNOSIS — R058 Other specified cough: Secondary | ICD-10-CM

## 2016-06-07 DIAGNOSIS — M8949 Other hypertrophic osteoarthropathy, multiple sites: Secondary | ICD-10-CM

## 2016-06-07 NOTE — Progress Notes (Signed)
Location:  [facility] of Service:  Clinic (12) Provider: Joellyn Grandt L. Mariea Clonts, D.O., C.M.D.  Patient Care Team: Gayland Curry, DO as PCP - General (Geriatric Medicine) Ronald Lobo, MD as Consulting Physician (Gastroenterology) Vickey Huger, MD as Consulting Physician (Orthopedic Surgery) Izora Ribas as Consulting Physician (Dermatology) Darlin Coco, MD as Consulting Physician (Cardiology) Luberta Mutter, MD as Consulting Physician (Ophthalmology)  Extended Emergency Contact Information Primary Emergency Contact: Marcelyn Ditty of [place] Phone: [phone] Relation: Friend Secondary Emergency Contact: [facility] Address: [place] [address] Johnnette Litter of [place] Phone: [phone] Work Phone: [phone] Mobile Phone: [phone] Relation: Son  Code Status: DNR Goals of Care: Advanced Directive information Advanced Directives 06/07/2016  Does Patient Have a Medical Advance Directive? Yes  Type of Paramedic of [place];Living will;Out of facility DNR (pink MOST or yellow form)  Does patient want to make changes to medical advance directive? No - Patient declined  Copy of [place] in Chart? Yes  Pre-existing out of facility DNR order (yellow form or pink MOST form) Yellow form placed in chart (order not valid for inpatient use)   Chief Complaint  Patient presents with  . Annual Exam    Wellness Exam  . MMSE    28/30 passed clock   HPI: Patient is a 83 y.o. female seen in today for an annual wellness exam.    Asks about vaccines--she is entirely up to date.  Has dry cough that comes and goes.  Lisinopril was previously responsible.  Does not want to change losartan unless she has to.    A couple of months ago, she was leaning on her chin at the dinner table looking at her grandson, faded away and then came to.  No one there  (including her doctor and nurse relatives) were concerned.  Says she did fall asleep.  Was her usual self when she did come to.  Only lasted 1-2 seconds.  No prodrome with dizziness, weakness, palpitations.    Says she cannot drink 6-8 8oz glasses of water.  She says it is like poison to her.  Has not tried adding anything to her water to create flavor like lemon or mint or touch of fruit juice. Does not feel thirsty.    Her daughter had a fall with a brain bleed of some type and has been having new problems since.  She has taken the bus out [place] on two [place] b/c she stopped driving on highways.  Depression screen [facility] 2/9 06/07/2016 02/09/2016 10/06/2015 02/11/2015 06/17/2014  Decreased Interest 0 0 0 0 0  Down, Depressed, Hopeless 0 0 0 0 0  PHQ - 2 Score 0 0 0 0 0    Fall Risk  06/07/2016 02/09/2016 10/06/2015 02/11/2015 06/17/2014  Falls in the past year? No No No No No  Number falls in past yr: - - - - -  Injury with Fall? - - - - -   MMSE - Mini Mental State Exam 06/07/2016  Orientation to time 5  Orientation to Place 5  Registration 3  Attention/ Calculation 5  Recall 1  Language- name 2 objects 2  Language- repeat 1  Language- follow 3 step command 3  Language- read & follow direction 1  Write a sentence 1  Copy design 1  Total score 28   Health Maintenance  Topic Date Due  . TETANUS/TDAP  12/12/2022  . INFLUENZA VACCINE  Completed  . DEXA SCAN  Completed  . ZOSTAVAX  Addressed  . PNA vac Low Risk Adult  Completed    Functional Status Survey: Is the patient deaf or have difficulty hearing?: Yes Does the patient have difficulty seeing, even when wearing glasses/contacts?: No Does the patient have difficulty concentrating, remembering, or making decisions?: Yes Does the patient have difficulty walking or climbing stairs?: Yes Does the patient have difficulty dressing or bathing?: No Does the patient have difficulty doing errands alone such as  visiting a doctor's office or shopping?: No Current Exercise Habits: The patient does not participate in regular exercise at present Exercise limited by: orthopedic condition(s) (limited by knee; intends to see new exercise lady) Diet? Regular  Vision Screening Comments: Dr. Luberta Mutter missed December appt Hearing:  [facility] a little bit Dentition:  No problem Pain:  Left knee pain, going to get synvisc if insurance approves it.  Left hip bursitis.    Past Medical History:  Diagnosis Date  . Arthritis    "all over"  . BACK PAIN 03/08/2007  . 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 DEPRESSIVE DISORDER 12/01/2008  . Duodenal ulcer   . GI bleed 09/12/2010   Naproxen induced Endoscopy with dr Harlene Ramus   . HIATAL HERNIA 12/17/2006  . History of hiatal hernia   . HYPERLIPIDEMIA 03/09/2006  . HYPERTENSION 03/09/2006  . KNEE PAIN 05/06/2009  . Pneumonia    53yrs. ago- hosp. pneumonia  . Urinary urgency     Past Surgical History:  Procedure Laterality Date  . ABDOMINAL HYSTERECTOMY  1975  . APPLICATION OF A-CELL OF EXTREMITY Right 12/10/2014   Procedure: APPLICATION OF A-CELL OF EXTREMITY;  Surgeon: Theodoro Kos, DO;  Location: [place];  Service: Plastics;  Laterality: Right;  . arthroscopic knee Right    x 2  . BACK SURGERY  2000   spinal fusion  . BILATERAL SALPINGECTOMY Bilateral 1989   Dr. Marianna Fuss  . CARDIAC CATHETERIZATION    . EYE SURGERY     cataracts bilateral - removed  . HERNIA REPAIR Bilateral    inguinal   . I&D EXTREMITY Right 12/10/2014   Procedure: IRRIGATION AND DEBRIDEMENT ON RIGHT LEG, ;  Surgeon: Theodoro Kos, DO;  Location: [place];  Service: Plastics;  Laterality: Right;  . I&D EXTREMITY Right 01/20/2015   Procedure: IRRIGATION AND DEBRIDEMENT RIGHT LOWER LEG WOUND, with ACELL to Donor Site, SKIN GRAFT AND VAC Placement;  Surgeon: Theodoro Kos, DO;  Location: [place];  Service: Plastics;  Laterality: Right;  . LUMBAR LAMINECTOMY  02-17-1999   with  sinal fusion L3,4,5,&S1  . moes     moses surgery on R lle  . OOPHORECTOMY  1989  . RESECTION DISTAL CLAVICAL    . ROTATOR CUFF REPAIR Right 2001   x2  . THYROIDECTOMY    . TONSILLECTOMY  1928  . TUBAL LIGATION      Family History  Problem Relation Age of Onset  . Cancer Mother 44  . Cancer Brother     Social History   Social History  . Marital status: Widowed    Spouse name: N/A  . Number of children: 5  . Years of education: N/A   Social History Main Topics  . Smoking status: Former Smoker    Years: 16.00  . Smokeless tobacco: Never Used     Comment: Quit 16  years  . Alcohol use 4.2 oz/week    7 Glasses of wine per week     Comment: wine occasionally @ dinner  . Drug use: No  . Sexual activity: Not Asked   Other Topics Concern  . None   Social History Narrative   Lives at [place] since 09/1992   Widow   Never smoked   Alcohol wine at night   Exercise classes 3-5 times a week    POA, DNR, Living Will       reports that she has quit smoking. She quit after 16.00 years of use. She has never used smokeless tobacco. She reports that she drinks about 4.2 oz of alcohol per week . She reports that she does not use drugs.  Allergies as of 06/07/2016      Reactions   Naproxen    Gi bleed   Nsaids    GI BLEED   Aspirin    GI bleed   Ace Inhibitors    Cough   Caffeine Other (See Comments)   Causes joints to be painful      Medication List       Accurate as of 06/07/16 10:56 AM. Always use your most recent med list.          acetaminophen 650 MG CR tablet Commonly known as:  TYLENOL Take 1 tablet (650 mg total) by mouth as needed (for pain).   alendronate 70 MG tablet Commonly known as:  FOSAMAX Take 1 tablet (70 mg total) by mouth every 7 (seven) days. Take with a full glass of water on an empty stomach.   citalopram 10 MG tablet Commonly known as:  CELEXA TAKE ONE TABLET BY MOUTH ONCE DAILY   cyclobenzaprine 10 MG tablet Commonly known as:   FLEXERIL Take 0.5 tablets (5 mg total) by mouth at bedtime.   Krill Oil 1000 MG Caps Take 1,000 mg by mouth. Twice daily   losartan 50 MG tablet Commonly known as:  COZAAR Take one tablet by mouth once daily for blood pressure   Lutein 20 MG Caps Take 20 mg by mouth daily.   omega-3 acid ethyl esters 1 g capsule Commonly known as:  LOVAZA Take 1 g by mouth daily.   Vitamin D3 2000 units Tabs Take by mouth daily.      Review of Systems:  Review of Systems  Constitutional: Negative for chills, fever and malaise/fatigue.  HENT: Positive for hearing loss. Negative for congestion.   Eyes: Negative for blurred vision.       Glasses  Respiratory: Positive for cough. Negative for hemoptysis, sputum production, shortness of breath and wheezing.   Cardiovascular: Negative for chest pain, palpitations and leg swelling.  Gastrointestinal: Negative for abdominal pain, blood in stool, constipation, heartburn and melena.  Genitourinary: Negative for dysuria.  Musculoskeletal: Negative for falls, joint pain and myalgias.  Skin: Negative for itching and rash.  Neurological: Negative for dizziness, loss of consciousness and weakness.  Endo/Heme/Allergies: Bruises/bleeds easily.  Psychiatric/Behavioral: Positive for memory loss. Negative for depression.       MCI    Physical Exam: Vitals:   06/07/16 1016  BP: (!) 158/80  Pulse: 80  Temp: 98.6 F (37 C)  TempSrc: Oral  SpO2: 97%  Weight: 129 lb (58.5 kg)  Height: 5' (1.524 m)   Body mass index is 25.19 kg/m. Physical Exam  Constitutional: She is oriented to person, place, and time. She appears well-developed and well-nourished. No distress.  HENT:  Head: Normocephalic  and atraumatic.  Right Ear: External ear normal.  Left Ear: External ear normal.  Nose: Nose normal.  Mouth/Throat: Oropharynx is clear and moist. No oropharyngeal exudate.  Hearing aides  Eyes: Conjunctivae and EOM are normal. Pupils are equal, round, and  reactive to light.  Neck: Normal range of motion. Neck supple. No JVD present. No thyromegaly present.  Cardiovascular: Normal rate, regular rhythm, normal heart sounds and intact distal pulses.   Pulmonary/Chest: Effort normal and breath sounds normal. No respiratory distress. Right breast exhibits no inverted nipple, no mass, no nipple discharge, no skin change and no tenderness. Left breast exhibits no inverted nipple, no mass, no nipple discharge, no skin change and no tenderness.  Abdominal: Soft. Bowel sounds are normal. She exhibits no distension.  Musculoskeletal: Normal range of motion.  Ambulates with rollator walker  Lymphadenopathy:    She has no axillary adenopathy.       Right: No supraclavicular adenopathy present.       Left: No supraclavicular adenopathy present.  Neurological: She is alert and oriented to person, place, and time. No cranial nerve deficit.  Some mild short term memory loss  Skin: Skin is warm and dry. There is pallor.  Psychiatric: She has a normal mood and affect. Her behavior is normal. Judgment and thought content normal.    Labs reviewed: Basic Metabolic Panel:  Recent Labs  02/01/16 0700  NA 141  K 4.4  BUN 22*  CREATININE 0.5    Assessment/Plan 1. Medicare annual wellness visit, subsequent -up to date on preventive care  -check FLP next week, but has been satisfactory for low risk pt  2. Annual physical exam -completed today  3. Memory loss, short term -has some mild cognitive impairment at 52; seems actually to be doing better now than she has her previous visits -unfortunately, prior MMSE results were not entered properly into epic so I cannot compare, but I think 28/30 may actually be an improvement for her  4. Senile osteoporosis -bone density 11/16  -continues on vitamin D supplement and weekly fosamax--cont this until next bone density 11/18, then d/c and consider alternative treatments  5. Primary osteoarthritis involving  multiple joints -continues on tylenol for arthritis pains, but did not complain of these today  6. Hyperlipidemia, unspecified hyperlipidemia type -lipids last checked were good for her age and lack of heart disease - recheck before next visit  7. Dry cough -suspect lisinopril might be playing a role but she does not want to change it--also it comes and goes suggesting more postnasal drip to me  8. Unsteady gait -cont use of walker regularly to prevent falls with injury--says she's come to terms with needing to use it now b/c she definitely does not want to fall  Labs/tests ordered: cbc, cmp, flp next week F/u 6 mos med mgt labs before  Audrianna Driskill L. Skyleen Bentley, D.O. [facility] [address], [place] [address] Cell Phone (Mon-Fri 8am-5pm):  [phone] On Call:  [phone] & follow prompts after 5pm & weekends Office Phone:  [phone] Office Fax:  [phone]

## 2016-06-08 DIAGNOSIS — M1712 Unilateral primary osteoarthritis, left knee: Secondary | ICD-10-CM | POA: Diagnosis not present

## 2016-06-09 DIAGNOSIS — M1712 Unilateral primary osteoarthritis, left knee: Secondary | ICD-10-CM | POA: Diagnosis not present

## 2016-06-13 ENCOUNTER — Encounter: Payer: Self-pay | Admitting: Internal Medicine

## 2016-06-13 DIAGNOSIS — D649 Anemia, unspecified: Secondary | ICD-10-CM | POA: Diagnosis not present

## 2016-06-13 DIAGNOSIS — M159 Polyosteoarthritis, unspecified: Secondary | ICD-10-CM | POA: Diagnosis not present

## 2016-06-13 DIAGNOSIS — G479 Sleep disorder, unspecified: Secondary | ICD-10-CM | POA: Diagnosis not present

## 2016-06-13 DIAGNOSIS — M810 Age-related osteoporosis without current pathological fracture: Secondary | ICD-10-CM | POA: Diagnosis not present

## 2016-06-13 DIAGNOSIS — R69 Illness, unspecified: Secondary | ICD-10-CM | POA: Diagnosis not present

## 2016-06-13 DIAGNOSIS — H9113 Presbycusis, bilateral: Secondary | ICD-10-CM | POA: Diagnosis not present

## 2016-06-13 DIAGNOSIS — E785 Hyperlipidemia, unspecified: Secondary | ICD-10-CM | POA: Diagnosis not present

## 2016-06-13 DIAGNOSIS — Z87891 Personal history of nicotine dependence: Secondary | ICD-10-CM | POA: Diagnosis not present

## 2016-06-13 DIAGNOSIS — I10 Essential (primary) hypertension: Secondary | ICD-10-CM | POA: Diagnosis not present

## 2016-06-13 DIAGNOSIS — Z974 Presence of external hearing-aid: Secondary | ICD-10-CM | POA: Diagnosis not present

## 2016-06-13 DIAGNOSIS — Z9181 History of falling: Secondary | ICD-10-CM | POA: Diagnosis not present

## 2016-06-13 DIAGNOSIS — R413 Other amnesia: Secondary | ICD-10-CM | POA: Diagnosis not present

## 2016-06-13 DIAGNOSIS — Z0000 Encounter for general adult medical examination without abnormal findings: Secondary | ICD-10-CM | POA: Diagnosis not present

## 2016-06-13 DIAGNOSIS — M47816 Spondylosis without myelopathy or radiculopathy, lumbar region: Secondary | ICD-10-CM | POA: Diagnosis not present

## 2016-06-13 DIAGNOSIS — M150 Primary generalized (osteo)arthritis: Secondary | ICD-10-CM | POA: Diagnosis not present

## 2016-06-13 DIAGNOSIS — C4490 Unspecified malignant neoplasm of skin, unspecified: Secondary | ICD-10-CM | POA: Diagnosis not present

## 2016-06-13 DIAGNOSIS — Z6824 Body mass index (BMI) 24.0-24.9, adult: Secondary | ICD-10-CM | POA: Diagnosis not present

## 2016-06-13 LAB — CBC AND DIFFERENTIAL
HCT: 42 % (ref 36–46)
Hemoglobin: 13.1 g/dL (ref 12.0–16.0)
Platelets: 170 10*3/uL (ref 150–399)
WBC: 6 10^3/mL

## 2016-06-13 LAB — BASIC METABOLIC PANEL
BUN: 23 mg/dL — AB (ref 4–21)
Creatinine: 0.6 mg/dL (ref 0.5–1.1)
Glucose: 87 mg/dL
Potassium: 4.2 mmol/L (ref 3.4–5.3)
Sodium: 142 mmol/L (ref 137–147)

## 2016-06-13 LAB — LIPID PANEL
Cholesterol: 252 mg/dL — AB (ref 0–200)
HDL: 82 mg/dL — AB (ref 35–70)
LDL Cholesterol: 153 mg/dL
Triglycerides: 84 mg/dL (ref 40–160)

## 2016-06-13 LAB — HEPATIC FUNCTION PANEL
ALT: 8 U/L (ref 7–35)
AST: 14 U/L (ref 13–35)
Alkaline Phosphatase: 62 U/L (ref 25–125)
Bilirubin, Total: 0.8 mg/dL

## 2016-06-19 ENCOUNTER — Encounter: Payer: Self-pay | Admitting: Internal Medicine

## 2016-06-19 DIAGNOSIS — L820 Inflamed seborrheic keratosis: Secondary | ICD-10-CM | POA: Diagnosis not present

## 2016-06-19 DIAGNOSIS — C44529 Squamous cell carcinoma of skin of other part of trunk: Secondary | ICD-10-CM | POA: Diagnosis not present

## 2016-06-26 ENCOUNTER — Other Ambulatory Visit: Payer: Self-pay | Admitting: Internal Medicine

## 2016-06-26 DIAGNOSIS — I10 Essential (primary) hypertension: Secondary | ICD-10-CM

## 2016-07-07 DIAGNOSIS — H903 Sensorineural hearing loss, bilateral: Secondary | ICD-10-CM | POA: Diagnosis not present

## 2016-07-17 ENCOUNTER — Encounter: Payer: Self-pay | Admitting: Internal Medicine

## 2016-07-17 ENCOUNTER — Other Ambulatory Visit: Payer: Self-pay | Admitting: Internal Medicine

## 2016-07-17 ENCOUNTER — Ambulatory Visit (INDEPENDENT_AMBULATORY_CARE_PROVIDER_SITE_OTHER): Payer: Medicare HMO | Admitting: Internal Medicine

## 2016-07-17 VITALS — BP 128/78 | HR 77 | Temp 98.0°F | Ht 60.0 in | Wt 128.0 lb

## 2016-07-17 DIAGNOSIS — R0989 Other specified symptoms and signs involving the circulatory and respiratory systems: Secondary | ICD-10-CM | POA: Diagnosis not present

## 2016-07-17 DIAGNOSIS — R05 Cough: Secondary | ICD-10-CM

## 2016-07-17 DIAGNOSIS — R059 Cough, unspecified: Secondary | ICD-10-CM

## 2016-07-17 DIAGNOSIS — R5383 Other fatigue: Secondary | ICD-10-CM

## 2016-07-17 DIAGNOSIS — D649 Anemia, unspecified: Secondary | ICD-10-CM | POA: Diagnosis not present

## 2016-07-17 LAB — CBC WITH DIFFERENTIAL/PLATELET
Basophils Absolute: 0 cells/uL (ref 0–200)
Basophils Relative: 0 %
Eosinophils Absolute: 94 cells/uL (ref 15–500)
Eosinophils Relative: 2 %
HCT: 35.6 % (ref 35.0–45.0)
Hemoglobin: 11.5 g/dL — ABNORMAL LOW (ref 11.7–15.5)
Lymphocytes Relative: 40 %
Lymphs Abs: 1880 cells/uL (ref 850–3900)
MCH: 28.3 pg (ref 27.0–33.0)
MCHC: 32.3 g/dL (ref 32.0–36.0)
MCV: 87.7 fL (ref 80.0–100.0)
MPV: 9.7 fL (ref 7.5–12.5)
Monocytes Absolute: 470 cells/uL (ref 200–950)
Monocytes Relative: 10 %
Neutro Abs: 2256 cells/uL (ref 1500–7800)
Neutrophils Relative %: 48 %
Platelets: 175 10*3/uL (ref 140–400)
RBC: 4.06 MIL/uL (ref 3.80–5.10)
RDW: 14.6 % (ref 11.0–15.0)
WBC: 4.7 10*3/uL (ref 3.8–10.8)

## 2016-07-17 LAB — BASIC METABOLIC PANEL
BUN: 33 mg/dL — ABNORMAL HIGH (ref 7–25)
CO2: 26 mmol/L (ref 20–31)
Calcium: 9.2 mg/dL (ref 8.6–10.4)
Chloride: 103 mmol/L (ref 98–110)
Creat: 0.72 mg/dL (ref 0.60–0.88)
Glucose, Bld: 90 mg/dL (ref 65–99)
Potassium: 4.7 mmol/L (ref 3.5–5.3)
Sodium: 137 mmol/L (ref 135–146)

## 2016-07-17 LAB — POCT INFLUENZA A/B
Influenza A, POC: NEGATIVE
Influenza B, POC: NEGATIVE

## 2016-07-17 MED ORDER — AZITHROMYCIN 250 MG PO TABS: ORAL_TABLET | ORAL | 0 refills | Status: DC

## 2016-07-17 NOTE — Progress Notes (Signed)
Location:  [facility] clinic Provider: Ardyth Kelso L. Mariea Clonts, D.O., C.M.D.  Code Status: DNR Goals of Care:  Advanced Directives 07/17/2016  Does Patient Have a Medical Advance Directive? Yes  Type of Paramedic of [place];Out of facility DNR (pink MOST or yellow form);Living will  Does patient want to make changes to medical advance directive? -  Copy of [place] in Chart? Yes  Pre-existing out of facility DNR order (yellow form or pink MOST form) Yellow form placed in chart (order not valid for inpatient use)   Chief Complaint  Patient presents with  . Acute Visit    cough    HPI: Patient is a 83 y.o. female seen today for an acute visit for cough she's had forever but it's now productive.   She's also weak.  She has no appetite and is napping a lot. She has not had a fever.  She is hoarse.  Has some head congestion also.  Mliss Sax said she needed to see me.  Says none of her close friends are sick.  She is not a great historian and seems slower than usual, fell asleep while waiting for me to return with flu swab results and having a bit more word-finding difficulty than usual.    Past Medical History:  Diagnosis Date  . Arthritis    "all over"  . BACK PAIN 03/08/2007  . 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 DEPRESSIVE DISORDER 12/01/2008  . Duodenal ulcer   . GI bleed 09/12/2010   Naproxen induced Endoscopy with dr Harlene Ramus   . HIATAL HERNIA 12/17/2006  . History of hiatal hernia   . HYPERLIPIDEMIA 03/09/2006  . HYPERTENSION 03/09/2006  . KNEE PAIN 05/06/2009  . Pneumonia    50yrs. ago- hosp. pneumonia  . Urinary urgency     Past Surgical History:  Procedure Laterality Date  . ABDOMINAL HYSTERECTOMY  1975  . APPLICATION OF A-CELL OF EXTREMITY Right 12/10/2014   Procedure: APPLICATION OF A-CELL OF EXTREMITY;  Surgeon: Theodoro Kos, DO;  Location: [place];  Service: Plastics;  Laterality: Right;  . arthroscopic knee  Right    x 2  . BACK SURGERY  2000   spinal fusion  . BILATERAL SALPINGECTOMY Bilateral 1989   Dr. Marianna Fuss  . CARDIAC CATHETERIZATION    . EYE SURGERY     cataracts bilateral - removed  . HERNIA REPAIR Bilateral    inguinal   . I&D EXTREMITY Right 12/10/2014   Procedure: IRRIGATION AND DEBRIDEMENT ON RIGHT LEG, ;  Surgeon: Theodoro Kos, DO;  Location: [place];  Service: Plastics;  Laterality: Right;  . I&D EXTREMITY Right 01/20/2015   Procedure: IRRIGATION AND DEBRIDEMENT RIGHT LOWER LEG WOUND, with ACELL to Donor Site, SKIN GRAFT AND VAC Placement;  Surgeon: Theodoro Kos, DO;  Location: [place];  Service: Plastics;  Laterality: Right;  . LUMBAR LAMINECTOMY  02-17-1999   with sinal fusion L3,4,5,&S1  . moes     moses surgery on R lle  . OOPHORECTOMY  1989  . RESECTION DISTAL CLAVICAL    . ROTATOR CUFF REPAIR Right 2001   x2  . THYROIDECTOMY    . TONSILLECTOMY  1928  . TUBAL LIGATION      Allergies  Allergen Reactions  . Naproxen     Gi bleed  . Nsaids     GI BLEED  . Aspirin     GI bleed  . Ace Inhibitors  Cough   . Caffeine Other (See Comments)    Causes joints to be painful    Allergies as of 07/17/2016      Reactions   Naproxen    Gi bleed   Nsaids    GI BLEED   Aspirin    GI bleed   Ace Inhibitors    Cough   Caffeine Other (See Comments)   Causes joints to be painful      Medication List       Accurate as of 07/17/16  3:34 PM. Always use your most recent med list.          acetaminophen 650 MG CR tablet Commonly known as:  TYLENOL Take 1 tablet (650 mg total) by mouth as needed (for pain).   alendronate 70 MG tablet Commonly known as:  FOSAMAX Take 1 tablet (70 mg total) by mouth every 7 (seven) days. Take with a full glass of water on an empty stomach.   citalopram 10 MG tablet Commonly known as:  CELEXA TAKE ONE TABLET BY MOUTH ONCE DAILY   cyclobenzaprine 10 MG tablet Commonly known as:  FLEXERIL Take 0.5 tablets (5 mg total) by mouth at  bedtime.   losartan 50 MG tablet Commonly known as:  COZAAR TAKE ONE TABLET BY MOUTH ONCE DAILY FOR BLOOD PRESSURE   Lutein 20 MG Caps Take 20 mg by mouth daily.   Vitamin D3 2000 units Tabs Take by mouth daily.       Review of Systems:  Review of Systems  Constitutional: Positive for malaise/fatigue. Negative for chills and fever.  HENT: Positive for hearing loss.   Eyes: Negative for blurred vision.  Respiratory: Positive for cough and sputum production. Negative for shortness of breath and wheezing.   Cardiovascular: Negative for chest pain, palpitations and leg swelling.  Gastrointestinal: Negative for abdominal pain, constipation, diarrhea, nausea and vomiting.  Genitourinary: Negative for dysuria.  Musculoskeletal: Negative for falls.       Not using her walker and drove here today  Neurological: Negative for weakness.       Slight increase in word finding and is slower and more lethargic today  Psychiatric/Behavioral: Positive for memory loss. Negative for depression.    Health Maintenance  Topic Date Due  . TETANUS/TDAP  12/12/2022  . INFLUENZA VACCINE  Completed  . DEXA SCAN  Completed  . PNA vac Low Risk Adult  Completed    Physical Exam: Vitals:   07/17/16 1512  BP: 128/78  Pulse: 77  Temp: 98 F (36.7 C)  TempSrc: Oral  SpO2: 96%  Weight: 128 lb (58.1 kg)  Height: 5' (1.524 m)   Body mass index is 25 kg/m. Physical Exam  Constitutional: She is oriented to person, place, and time. She appears well-developed and well-nourished. No distress.  HENT:  Mouth/Throat: No oropharyngeal exudate.  Nasal congestion; hoarse voice  Cardiovascular: Normal rate, regular rhythm, normal heart sounds and intact distal pulses.   Pulmonary/Chest: Effort normal.  Coarse rhonchi throughout posterior exam  Neurological: She is alert and oriented to person, place, and time.  Increased delay with word finding, not best historian  Skin: Skin is warm and dry.    Labs  reviewed: Basic Metabolic Panel:  Recent Labs  02/01/16 0700 06/13/16 0800  NA 141 142  K 4.4 4.2  BUN 22* 23*  CREATININE 0.5 0.6   Liver Function Tests:  Recent Labs  06/13/16 0800  AST 14  ALT 8  ALKPHOS 62   No  results for input(s): LIPASE, AMYLASE in the last 8760 hours. No results for input(s): AMMONIA in the last 8760 hours. CBC:  Recent Labs  06/13/16 0800  WBC 6.0  HGB 13.1  HCT 42  PLT 170   Lipid Panel:  Recent Labs  06/13/16 0800  CHOL 252*  HDL 82*  LDLCALC 153  TRIG 84   Assessment/Plan 1. Cough - POC Influenza A/B NEGATIVE - CBC with Differential/Platelet - Basic metabolic panel - Seems she has early bronchitis - doubt her prior cough has anything to do with current cough that is productive - azithromycin (ZITHROMAX) 250 MG tablet; 2 tabs today and 1 tab daily thereafter  Dispense: 6 tablet; [facility]ill: [facility] [address] at her request -encouraged conservative measures with pushing po fluids, resting and warm humidity in the shower -no anticough med prescribed b/c this may prolong the infection at this point--if get better otherwise and still has mild cough in a few weeks, consider tessalon perles then  2. Fatigue, unspecified type - this concerned me b/c she is usually quite peppy especially for a 83 yo (acts 70ish) -fortunately, flu negative -will rule out electrolyte abnormality, dehydration, anemia with basic labs - POC Influenza A/B - CBC with Differential/Platelet - Basic metabolic panel - azithromycin (ZITHROMAX) 250 MG tablet; 2 tabs today and 1 tab daily thereafter  Dispense: 6 tablet; Refill: 0  3. Chest congestion - see #1 - POC Influenza A/B - CBC with Differential/Platelet - Basic metabolic panel - azithromycin (ZITHROMAX) 250 MG tablet; 2 tabs today and 1 tab daily thereafter  Dispense: 6 tablet; Refill: 0  Labs/tests ordered:   Orders Placed This Encounter  Procedures  . CBC with  Differential/Platelet  . Basic metabolic panel  . POC Influenza A/B    Next appt:  12/06/2016  Edee Nifong L. Chai Verdejo, D.O. [facility] [address], [place] [address] Cell Phone (Mon-Fri 8am-5pm):  [phone] On Call:  [phone] & follow prompts after 5pm & weekends Office Phone:  [phone] Office Fax:  [phone]

## 2016-07-17 NOTE — Patient Instructions (Signed)
Drink plenty of fluids and rest. Sit in the shower and inhale warm humidity to loosen the mucus.   [facility]

## 2016-07-18 ENCOUNTER — Ambulatory Visit
Admission: RE | Admit: 2016-07-18 | Discharge: 2016-07-18 | Disposition: A | Discharge status: Home / Self Care | Payer: Medicare HMO | Source: Ambulatory Visit | Attending: Internal Medicine | Admitting: Internal Medicine

## 2016-07-18 ENCOUNTER — Encounter: Payer: Self-pay | Admitting: Internal Medicine

## 2016-07-18 ENCOUNTER — Other Ambulatory Visit: Payer: Self-pay | Admitting: Internal Medicine

## 2016-07-18 DIAGNOSIS — R05 Cough: Secondary | ICD-10-CM | POA: Diagnosis not present

## 2016-07-18 DIAGNOSIS — R059 Cough, unspecified: Secondary | ICD-10-CM

## 2016-07-18 LAB — IRON,TIBC AND FERRITIN PANEL
%SAT: 16 % (ref 11–50)
Ferritin: 45 ng/mL (ref 20–288)
Iron: 57 ug/dL (ref 45–160)
TIBC: 367 ug/dL (ref 250–450)

## 2016-07-19 ENCOUNTER — Encounter: Payer: Self-pay | Admitting: *Deleted

## 2016-07-19 DIAGNOSIS — K921 Melena: Secondary | ICD-10-CM | POA: Diagnosis not present

## 2016-07-19 DIAGNOSIS — E785 Hyperlipidemia, unspecified: Secondary | ICD-10-CM | POA: Diagnosis not present

## 2016-07-19 DIAGNOSIS — D649 Anemia, unspecified: Secondary | ICD-10-CM | POA: Diagnosis not present

## 2016-07-19 LAB — FECAL OCCULT BLOOD, GUAIAC: Fecal Occult Blood: NEGATIVE

## 2016-07-21 ENCOUNTER — Encounter: Payer: Self-pay | Admitting: *Deleted

## 2016-07-27 ENCOUNTER — Other Ambulatory Visit: Payer: Self-pay

## 2016-07-27 NOTE — Telephone Encounter (Signed)
Patient last had flexeril filled 10/04/2015 is this ok to fill?

## 2016-07-28 NOTE — Telephone Encounter (Signed)
Spoke with patient she stated that she was not using Flexeril for muscle spasms that she was using it to help sleep at night. Patient stated that she would give [place] a call back if she needed [place] or it bad enough.

## 2016-07-28 NOTE — Telephone Encounter (Signed)
She should really not take this at her age and fall risk.  Recommend heat for muscle spasms instead.

## 2016-07-31 ENCOUNTER — Other Ambulatory Visit: Payer: Self-pay | Admitting: *Deleted

## 2016-07-31 MED ORDER — CYCLOBENZAPRINE HCL 10 MG PO TABS: 5.0000 mg | ORAL_TABLET | Freq: Every day | ORAL | 1 refills | Status: DC

## 2016-07-31 NOTE — Telephone Encounter (Signed)
[facility] [address].  

## 2016-09-11 DIAGNOSIS — L821 Other seborrheic keratosis: Secondary | ICD-10-CM | POA: Diagnosis not present

## 2016-09-11 DIAGNOSIS — Z85828 Personal history of other malignant neoplasm of skin: Secondary | ICD-10-CM | POA: Diagnosis not present

## 2016-09-11 DIAGNOSIS — L570 Actinic keratosis: Secondary | ICD-10-CM | POA: Diagnosis not present

## 2016-09-12 DIAGNOSIS — G8929 Other chronic pain: Secondary | ICD-10-CM | POA: Diagnosis not present

## 2016-09-12 DIAGNOSIS — M1712 Unilateral primary osteoarthritis, left knee: Secondary | ICD-10-CM | POA: Diagnosis not present

## 2016-10-06 DIAGNOSIS — H52203 Unspecified astigmatism, bilateral: Secondary | ICD-10-CM | POA: Diagnosis not present

## 2016-10-06 DIAGNOSIS — Z961 Presence of intraocular lens: Secondary | ICD-10-CM | POA: Diagnosis not present

## 2016-10-06 DIAGNOSIS — D3132 Benign neoplasm of left choroid: Secondary | ICD-10-CM | POA: Diagnosis not present

## 2016-12-06 ENCOUNTER — Encounter: Payer: Self-pay | Admitting: Internal Medicine

## 2016-12-06 ENCOUNTER — Non-Acute Institutional Stay: Payer: Medicare HMO | Admitting: Internal Medicine

## 2016-12-06 VITALS — BP 138/78 | HR 87 | Temp 97.8°F | Wt 128.0 lb

## 2016-12-06 DIAGNOSIS — R413 Other amnesia: Secondary | ICD-10-CM

## 2016-12-06 DIAGNOSIS — K5904 Chronic idiopathic constipation: Secondary | ICD-10-CM | POA: Diagnosis not present

## 2016-12-06 DIAGNOSIS — M8949 Other hypertrophic osteoarthropathy, multiple sites: Secondary | ICD-10-CM

## 2016-12-06 DIAGNOSIS — R05 Cough: Secondary | ICD-10-CM | POA: Diagnosis not present

## 2016-12-06 DIAGNOSIS — N393 Stress incontinence (female) (male): Secondary | ICD-10-CM | POA: Diagnosis not present

## 2016-12-06 DIAGNOSIS — M810 Age-related osteoporosis without current pathological fracture: Secondary | ICD-10-CM | POA: Diagnosis not present

## 2016-12-06 DIAGNOSIS — R059 Cough, unspecified: Secondary | ICD-10-CM

## 2016-12-06 DIAGNOSIS — M159 Polyosteoarthritis, unspecified: Secondary | ICD-10-CM

## 2016-12-06 DIAGNOSIS — M150 Primary generalized (osteo)arthritis: Secondary | ICD-10-CM

## 2016-12-06 NOTE — Progress Notes (Signed)
Location:  Occupational psychologist of Service:  Clinic (12)  Provider: Michoel Kunin L. Mariea Clonts, D.O., C.M.D.  Code Status: DNR Goals of Care:  Advanced Directives 12/06/2016  Does Patient Have a Medical Advance Directive? Yes  Type of Paramedic of [place];Out of facility DNR (pink MOST or yellow form)  Does patient want to make changes to medical advance directive? -  Copy of [place] in Chart? Yes  Pre-existing out of facility DNR order (yellow form or pink MOST form) Yellow form placed in chart (order not valid for inpatient use)   Chief Complaint  Patient presents with  . Medical Management of Chronic Issues    60mth follow-up    HPI: Patient is a 83 y.o. female seen today for medical management of chronic diseases.    BP at goal.  On losartan 50mg  po daily.  Tolerating well. No episodes of dehydration.  Says she feels normal considering her age.    Memory loss:  She told our CMA that she had not been here in the clinic before.  Still has word-finding difficulty.  She came over w/o her walker b/c she drove from her home to the health care building.    Cough did resolve.  She does not recall what made it go way.    Constipation:  Bowels work pretty well.  She has to be very careful to keep them on track with a prune or 1/2 prune.  Has difficulty with control if she takes a full prune at times.    Urinary incontinence:  Uses pads from [employer].  Leaks with coughing and sneezing, not continuous leakage.  She tries to stay close to the restroom.  Has only had 1-2 episodes of outright urge incontinence.    Not having pain.  Sleeps very well at night.  Appetite is too good.    Spirits are ok.  Her daughter is still not doing well with her hearing.  She had fallen and had a brain bleed and seizures.  Pt remains on celexa.    Osteoporosis:  Remains on fosamax and vitamin D daily and calcium citrate.  Last bone density was 11/16.    Past Medical History:  Diagnosis Date  . Arthritis    "all over"  . BACK PAIN 03/08/2007  . 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 DEPRESSIVE DISORDER 12/01/2008  . Duodenal ulcer   . GI bleed 09/12/2010   Naproxen induced Endoscopy with dr Harlene Ramus   . HIATAL HERNIA 12/17/2006  . History of hiatal hernia   . HYPERLIPIDEMIA 03/09/2006  . HYPERTENSION 03/09/2006  . KNEE PAIN 05/06/2009  . Pneumonia    56yrs. ago- hosp. pneumonia  . Urinary urgency     Past Surgical History:  Procedure Laterality Date  . ABDOMINAL HYSTERECTOMY  1975  . APPLICATION OF A-CELL OF EXTREMITY Right 12/10/2014   Procedure: APPLICATION OF A-CELL OF EXTREMITY;  Surgeon: Theodoro Kos, DO;  Location: [place];  Service: Plastics;  Laterality: Right;  . arthroscopic knee Right    x 2  . BACK SURGERY  2000   spinal fusion  . BILATERAL SALPINGECTOMY Bilateral 1989   Dr. Marianna Fuss  . CARDIAC CATHETERIZATION    . EYE SURGERY     cataracts bilateral - removed  . HERNIA REPAIR Bilateral    inguinal   . I&D EXTREMITY Right 12/10/2014   Procedure: IRRIGATION AND DEBRIDEMENT ON RIGHT LEG, ;  Surgeon: Theodoro Kos, DO;  Location: [place];  Service: Plastics;  Laterality: Right;  . I&D EXTREMITY Right 01/20/2015   Procedure: IRRIGATION AND DEBRIDEMENT RIGHT LOWER LEG WOUND, with ACELL to Donor Site, SKIN GRAFT AND VAC Placement;  Surgeon: Theodoro Kos, DO;  Location: [place];  Service: Plastics;  Laterality: Right;  . LUMBAR LAMINECTOMY  02-17-1999   with sinal fusion L3,4,5,&S1  . moes     moses surgery on R lle  . OOPHORECTOMY  1989  . RESECTION DISTAL CLAVICAL    . ROTATOR CUFF REPAIR Right 2001   x2  . THYROIDECTOMY    . TONSILLECTOMY  1928  . TUBAL LIGATION      Allergies  Allergen Reactions  . Naproxen     Gi bleed  . Nsaids     GI BLEED  . Aspirin     GI bleed  . Ace Inhibitors     Cough   . Caffeine Other (See Comments)    Causes joints to be painful    Allergies as of  12/06/2016      Reactions   Naproxen    Gi bleed   Nsaids    GI BLEED   Aspirin    GI bleed   Ace Inhibitors    Cough   Caffeine Other (See Comments)   Causes joints to be painful      Medication List       Accurate as of 12/06/16 10:11 AM. Always use your most recent med list.          acetaminophen 650 MG CR tablet Commonly known as:  TYLENOL Take 1 tablet (650 mg total) by mouth as needed (for pain).   alendronate 70 MG tablet Commonly known as:  FOSAMAX Take 1 tablet (70 mg total) by mouth every 7 (seven) days. Take with a full glass of water on an empty stomach.   citalopram 10 MG tablet Commonly known as:  CELEXA TAKE ONE TABLET BY MOUTH ONCE DAILY   cyclobenzaprine 10 MG tablet Commonly known as:  FLEXERIL Take 0.5 tablets (5 mg total) by mouth at bedtime.   losartan 50 MG tablet Commonly known as:  COZAAR TAKE ONE TABLET BY MOUTH ONCE DAILY FOR BLOOD PRESSURE   Lutein 20 MG Caps Take 20 mg by mouth daily.   multivitamin tablet Take 1 tablet by mouth daily.   PROBIOTIC-10 PO Take by mouth daily.   Vitamin D3 2000 units Tabs Take by mouth daily.       Review of Systems:  Review of Systems  Constitutional: Negative for chills, fever and malaise/fatigue.  HENT: Negative for hearing loss.   Eyes: Negative for blurred vision.       Glasses  Respiratory: Negative for cough and shortness of breath.   Cardiovascular: Positive for leg swelling. Negative for chest pain and palpitations.  Gastrointestinal: Positive for constipation. Negative for abdominal pain, blood in stool, diarrhea and melena.  Genitourinary: Positive for urgency. Negative for dysuria.  Musculoskeletal: Negative for back pain, falls and joint pain.  Skin: Negative for itching and rash.  Neurological: Positive for sensory change. Negative for dizziness, loss of consciousness and weakness.       Unsteady on her feet  Psychiatric/Behavioral: Positive for memory loss. Negative for  depression.    Health Maintenance  Topic Date Due  . INFLUENZA VACCINE  12/27/2016  . TETANUS/TDAP  12/12/2022  . DEXA SCAN  Completed  . PNA vac Low Risk Adult  Completed  Physical Exam: Vitals:   12/06/16 0937  BP: 138/78  Pulse: 87  Temp: 97.8 F (36.6 C)  TempSrc: Oral  SpO2: 97%  Weight: 128 lb (58.1 kg)   Body mass index is 25 kg/m. Physical Exam  Constitutional: She is oriented to person, place, and time. She appears well-developed and well-nourished. No distress.  HENT:  Head: Normocephalic and atraumatic.  Eyes:  glasses  Cardiovascular: Normal rate, regular rhythm, normal heart sounds and intact distal pulses.   Pulmonary/Chest: Effort normal and breath sounds normal. No respiratory distress.  Abdominal: Soft. Bowel sounds are normal. She exhibits no distension.  Musculoskeletal: Normal range of motion.  Unsteady, narrow-based gait almost scissored  Neurological: She is alert and oriented to person, place, and time.  Short term memory loss, repeated self a few times during appt, did not remember coming here to the clinic  Skin: Skin is warm and dry. Capillary refill takes less than 2 seconds. There is pallor.  Psychiatric: She has a normal mood and affect.    Labs reviewed: Basic Metabolic Panel:  Recent Labs  02/01/16 0700 06/13/16 0800 07/17/16 0001  NA 141 142 137  K 4.4 4.2 4.7  CL  --   --  103  CO2  --   --  26  GLUCOSE  --   --  90  BUN 22* 23* 33*  CREATININE 0.5 0.6 0.72  CALCIUM  --   --  9.2   Liver Function Tests:  Recent Labs  06/13/16 0800  AST 14  ALT 8  ALKPHOS 62   No results for input(s): LIPASE, AMYLASE in the last 8760 hours. No results for input(s): AMMONIA in the last 8760 hours. CBC:  Recent Labs  06/13/16 0800 07/17/16 0001  WBC 6.0 4.7  NEUTROABS  --  2,256  HGB 13.1 11.5*  HCT 42 35.6  MCV  --  87.7  PLT 170 175   Lipid Panel:  Recent Labs  06/13/16 0800  CHOL 252*  HDL 82*  LDLCALC 153   TRIG 84   Assessment/Plan 1. Cough -resolved  2. Memory loss, short term -seems this is progressing, we'll see how she does on next mmse with AWV  3. Senile osteoporosis -ongoing, takes fosamax and vitamin D, does walk for exercise, needs bone density rechecked - DG Bone Density; Future  4. Primary osteoarthritis involving multiple joints -chronic, stable, affects knees primarily -cont tylenol as needed for this  5. Chronic idiopathic constipation -doing fine with hydration and half a prune daily  6. SUI (stress urinary incontinence, female) -ongoing, some degree of urgency also but mostly stress and not faithful with kegels/pelvic floor exercises  Labs/tests ordered:  Orders Placed This Encounter  Procedures  . DG Bone Density    Standing Status:   Future    Standing Expiration Date:   02/06/2018    Scheduling Instructions:     Due in November    Order Specific Question:   Reason for Exam (SYMPTOM  OR DIAGNOSIS REQUIRED)    Answer:   f/u on osteoporosis, last was 11/16, on fosamax and ca and vitamin D    Order Specific Question:   Preferred imaging location?    Answer:   [facility]    Next appt:  6 mos for annual exam, also due for AWV then, labs before   Candelario Steppe L. Shenoa Hattabaugh, D.O. [facility] [address], [place] [address] Cell Phone (Mon-Fri 8am-5pm):  [phone] On  Call:  [phone] & follow prompts after 5pm & weekends Office Phone:  [phone] Office Fax:  [phone]

## 2016-12-20 ENCOUNTER — Other Ambulatory Visit: Payer: Self-pay | Admitting: Internal Medicine

## 2016-12-20 DIAGNOSIS — M810 Age-related osteoporosis without current pathological fracture: Secondary | ICD-10-CM

## 2016-12-29 ENCOUNTER — Encounter: Payer: Self-pay | Admitting: Internal Medicine

## 2016-12-29 ENCOUNTER — Other Ambulatory Visit: Payer: Self-pay | Admitting: *Deleted

## 2016-12-29 ENCOUNTER — Telehealth: Payer: Self-pay

## 2016-12-29 ENCOUNTER — Ambulatory Visit
Admission: RE | Admit: 2016-12-29 | Discharge: 2016-12-29 | Disposition: A | Discharge status: Home / Self Care | Payer: Medicare HMO | Source: Ambulatory Visit | Attending: Internal Medicine | Admitting: Internal Medicine

## 2016-12-29 ENCOUNTER — Ambulatory Visit (INDEPENDENT_AMBULATORY_CARE_PROVIDER_SITE_OTHER): Payer: Medicare HMO | Admitting: Internal Medicine

## 2016-12-29 VITALS — BP 140/80 | HR 76 | Temp 98.1°F | Wt 126.0 lb

## 2016-12-29 DIAGNOSIS — M545 Low back pain, unspecified: Secondary | ICD-10-CM

## 2016-12-29 DIAGNOSIS — M62830 Muscle spasm of back: Secondary | ICD-10-CM | POA: Diagnosis not present

## 2016-12-29 DIAGNOSIS — M150 Primary generalized (osteo)arthritis: Secondary | ICD-10-CM

## 2016-12-29 DIAGNOSIS — Z9889 Other specified postprocedural states: Secondary | ICD-10-CM | POA: Diagnosis not present

## 2016-12-29 DIAGNOSIS — M159 Polyosteoarthritis, unspecified: Secondary | ICD-10-CM

## 2016-12-29 DIAGNOSIS — M8949 Other hypertrophic osteoarthropathy, multiple sites: Secondary | ICD-10-CM

## 2016-12-29 MED ORDER — AMBULATORY NON FORMULARY MEDICATION: 0 refills | Status: DC

## 2016-12-29 NOTE — Patient Instructions (Addendum)
USE WARM COMPRESSES AT LEAST 2 TIMES DAILY SOAKED IN EPSOM SALT  WILL CALL WITH  XRAY RESULTS  NO HEAVY LIFTING (NOTHING HEAVIER THAN GALLON OF MILK) X 7 DAYS  MAY NEED PHYSICAL THERAPY TO HELP HEAL BACK PAIN if xray negative  MAY TAKE TYLENOL AS NEEDED FOR PAIN  Continue current medications as ordered  Follow up with Dr Mariea Clonts as scheduled  Back Pain, Adult Back pain is very common. The pain often gets better over time. The cause of back pain is usually not dangerous. Most people can learn to manage their back pain on their own. Follow these instructions at home: Watch your back pain for any changes. The following actions may help to lessen any pain you are feeling:  Stay active. Start with short walks on flat ground if you can. Try to walk farther each day.  Exercise regularly as told by your doctor. Exercise helps your back heal faster. It also helps avoid future injury by keeping your muscles strong and flexible.  Do not sit, drive, or stand in one place for more than 30 minutes.  Do not stay in bed. Resting more than 1-2 days can slow down your recovery.  Be careful when you bend or lift an object. Use good form when lifting: ? Bend at your knees. ? Keep the object close to your body. ? Do not twist.  Sleep on a firm mattress. Lie on your side, and bend your knees. If you lie on your back, put a pillow under your knees.  Take medicines only as told by your doctor.  Put ice on the injured area. ? Put ice in a plastic bag. ? Place a towel between your skin and the bag. ? Leave the ice on for 20 minutes, 2-3 times a day for the first 2-3 days. After that, you can switch between ice and heat packs.  Avoid feeling anxious or stressed. Find good ways to deal with stress, such as exercise.  Maintain a healthy weight. Extra weight puts stress on your back.  Contact a doctor if:  You have pain that does not go away with rest or medicine.  You have worsening pain that goes  down into your legs or buttocks.  You have pain that does not get better in one week.  You have pain at night.  You lose weight.  You have a fever or chills. Get help right away if:  You cannot control when you poop (bowel movement) or pee (urinate).  Your arms or legs feel weak.  Your arms or legs lose feeling (numbness).  You feel sick to your stomach (nauseous) or throw up (vomit).  You have belly (abdominal) pain.  You feel like you may pass out (faint). This information is not intended to replace advice given to you by your health care provider. Make sure you discuss any questions you have with your health care provider. Document Released: 11/01/2007 Document Revised: 10/21/2015 Document Reviewed: 09/16/2013 Elsevier Interactive Patient Education  [employer].

## 2016-12-29 NOTE — Telephone Encounter (Signed)
-----   Message from [place], [place] sent at 12/29/2016  2:15 PM EDT ----- Vanessa Mason reveals no acute process in lower back - recommend PT eval and tx for low back pain; continue current medications and warm compresses as needed as ordered.; follow up with Dr Mariea Clonts as scheduled

## 2016-12-29 NOTE — Telephone Encounter (Signed)
Fax PT order to [facility]

## 2016-12-29 NOTE — Progress Notes (Signed)
Patient ID: Vanessa Mason, female   DOB: 01-Nov-1917, 83 y.o.   MRN: [id number]    Location:  [facility] Place of Service: OFFICE  Chief Complaint  Patient presents with  . Acute Visit    back pain x2 weeks    HPI:  83 yo female seen today for back pain x 10 days. She states she was helping her daughter, who had fallen over a parking block, off the ground prior to onset of pain. She believes she pulled a muscle while getting ice and helping her off the ground. Pain is getting worse and feels like it is "grabbing". Hx back sx several yrs ago that included multi-level fusion. She is c/a disrupting past sx. No loss of bowel/bladder control. No numbness or tingling or burning sensations in her legs. She has difficulty ambulating without pain. She usually uses a walker but has a cane today.  Past Medical History:  Diagnosis Date  . Arthritis    "all over"  . BACK PAIN 03/08/2007  . 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 DEPRESSIVE DISORDER 12/01/2008  . Duodenal ulcer   . GI bleed 09/12/2010   Naproxen induced Endoscopy with dr Harlene Ramus   . HIATAL HERNIA 12/17/2006  . History of hiatal hernia   . HYPERLIPIDEMIA 03/09/2006  . HYPERTENSION 03/09/2006  . KNEE PAIN 05/06/2009  . Pneumonia    68yrs. ago- hosp. pneumonia  . Urinary urgency     Past Surgical History:  Procedure Laterality Date  . ABDOMINAL HYSTERECTOMY  1975  . APPLICATION OF A-CELL OF EXTREMITY Right 12/10/2014   Procedure: APPLICATION OF A-CELL OF EXTREMITY;  Surgeon: Theodoro Kos, DO;  Location: [place];  Service: Plastics;  Laterality: Right;  . arthroscopic knee Right    x 2  . BACK SURGERY  2000   spinal fusion  . BILATERAL SALPINGECTOMY Bilateral 1989   Dr. Marianna Fuss  . CARDIAC CATHETERIZATION    . EYE SURGERY     cataracts bilateral - removed  . HERNIA REPAIR Bilateral    inguinal   . I&D EXTREMITY Right 12/10/2014   Procedure: IRRIGATION AND DEBRIDEMENT ON RIGHT LEG, ;  Surgeon: Theodoro Kos,  DO;  Location: [facility];  Service: Plastics;  Laterality: Right;  . I&D EXTREMITY Right 01/20/2015   Procedure: IRRIGATION AND DEBRIDEMENT RIGHT LOWER LEG WOUND, with ACELL to Donor Site, SKIN GRAFT AND VAC Placement;  Surgeon: Theodoro Kos, DO;  Location: [facility];  Service: Plastics;  Laterality: Right;  . LUMBAR LAMINECTOMY  02-17-1999   with sinal fusion L3,4,5,&S1  . moes     moses surgery on R lle  . OOPHORECTOMY  1989  . RESECTION DISTAL CLAVICAL    . ROTATOR CUFF REPAIR Right 2001   x2  . THYROIDECTOMY    . TONSILLECTOMY  1928  . TUBAL LIGATION      Patient Care Team: Gayland Curry, DO as PCP - General (Geriatric Medicine) Ronald Lobo, MD as Consulting Physician (Gastroenterology) Vickey Huger, MD as Consulting Physician (Orthopedic Surgery) Izora Ribas as Consulting Physician (Dermatology) Darlin Coco, MD as Consulting Physician (Cardiology) Luberta Mutter, MD as Consulting Physician (Ophthalmology)  Social History   Social History  . Marital status: Widowed    Spouse name: N/A  . Number of children: 5  . Years of education: N/A   Occupational History  . Not on file.   Social History Main Topics  . Smoking status: Former Smoker  Years: 16.00  . Smokeless tobacco: Never Used     Comment: Quit 16 years  . Alcohol use 4.2 oz/week    7 Glasses of wine per week     Comment: wine occasionally @ dinner  . Drug use: No  . Sexual activity: Not on file   Other Topics Concern  . Not on file   Social History Narrative   Lives at [facility] since 09/1992   Widow   Never smoked   Alcohol wine at night   Exercise classes 3-5 times a week    POA, DNR, Living Will        reports that she has quit smoking. She quit after 16.00 years of use. She has never used smokeless tobacco. She reports that she drinks about 4.2 oz of alcohol per week . She reports that she does not use drugs.  Family History  Problem Relation Age of Onset  . Cancer Mother 66  .  Cancer Brother    Family Status  Relation Status  . Mother Deceased  . Brother Deceased  . Father Deceased at age 70  . Sister Deceased  . Brother Deceased  . Daughter Alive  . Son Alive  . Son Alive  . Son Alive  . Daughter Alive     Allergies  Allergen Reactions  . Naproxen     Gi bleed  . Nsaids     GI BLEED  . Aspirin     GI bleed  . Ace Inhibitors     Cough   . Caffeine Other (See Comments)    Causes joints to be painful    Medications: Patient's Medications  New Prescriptions   No medications on file  Previous Medications   ACETAMINOPHEN (TYLENOL) 650 MG CR TABLET    Take 1 tablet (650 mg total) by mouth as needed (for pain).   ALENDRONATE (FOSAMAX) 70 MG TABLET    TAKE ONE TABLET BY MOUTH EVERY 7 DAYS TAKE WITH A FULL GLASS OF WATER ON AN EMPTY STOMACH   CHOLECALCIFEROL (VITAMIN D3) 2000 UNITS TABS    Take by mouth daily.   CITALOPRAM (CELEXA) 10 MG TABLET    TAKE ONE TABLET BY MOUTH ONCE DAILY   CYCLOBENZAPRINE (FLEXERIL) 10 MG TABLET    Take 0.5 tablets (5 mg total) by mouth at bedtime.   LOSARTAN (COZAAR) 50 MG TABLET    TAKE ONE TABLET BY MOUTH ONCE DAILY FOR BLOOD PRESSURE   LUTEIN 20 MG CAPS    Take 20 mg by mouth daily.    MULTIPLE VITAMIN (MULTIVITAMIN) TABLET    Take 1 tablet by mouth daily.   PROBIOTIC PRODUCT (PROBIOTIC-10 PO)    Take by mouth daily.  Modified Medications   No medications on file  Discontinued Medications   No medications on file    Review of Systems  Musculoskeletal: Positive for arthralgias, back pain and gait problem.  All other systems reviewed and are negative.   Vitals:   12/29/16 0928  BP: 140/80  Pulse: 76  Temp: 98.1 F (36.7 C)  TempSrc: Oral  SpO2: 96%  Weight: 126 lb (57.2 kg)   Body mass index is 24.61 kg/m.  Physical Exam  Constitutional: She is oriented to person, place, and time. She appears well-developed and well-nourished. No distress.  Looks uncomfortable  Cardiovascular: Intact distal  pulses and normal pulses.   No calf TTP b/l. No distal swelling  Musculoskeletal: She exhibits edema (left knee) and tenderness.  Lumbar back: She exhibits decreased range of motion, tenderness, pain and spasm. She exhibits no bony tenderness, no swelling, no edema and no deformity.       Back:  Left short leg; right pelvic in flare; neg SLR; strength 4/5 in thigh extensors; paravertebral lumbar muscle hypertrophy with ropey tissue texture changes; no piriformis TP; gait antalgic; DTRs+2/2  Neurological: She is alert and oriented to person, place, and time.  Skin: Skin is warm and dry. No rash noted.  Psychiatric: She has a normal mood and affect. Her behavior is normal. Judgment and thought content normal.     Labs reviewed: No visits with results within 3 Month(s) from this visit.  Latest known visit with results is:  Abstract on 07/21/2016  Component Date Value Ref Range Status  . Fecal Occult Blood 07/19/2016 Negative   Final   [facility]    No results found.   Assessment/Plan   ICD-10-CM   1. Acute right-sided low back pain without sciatica - 2/2 sprain/strain M54.5 DG Lumbar Spine Complete  2. Muscle spasm of back M62.830 DG Lumbar Spine Complete  3. History of back surgery Z98.890 DG Lumbar Spine Complete  4. Primary osteoarthritis involving multiple joints M15.0    USE WARM COMPRESSES AT LEAST 2 TIMES DAILY SOAKED IN EPSOM SALT  WILL CALL WITH  XRAY RESULTS  NO HEAVY LIFTING (NOTHING HEAVIER THAN GALLON OF MILK) X 7 DAYS  MAY NEED PHYSICAL THERAPY TO HELP HEAL BACK PAIN if xray negative  MAY TAKE TYLENOL AS NEEDED FOR PAIN  Continue current medications as ordered  Follow up with Dr Mariea Clonts as scheduled  Education handout given on back pain  Monica S. Perlie Gold  [facility] [address] [place], [place] [address] [phone] Cell (Monday-Friday 8 AM - 5 PM) [phone] After 5 PM and follow  prompts

## 2016-12-29 NOTE — Telephone Encounter (Signed)
Home Health Order added per [facility] for Dr Eulas Post.

## 2016-12-31 ENCOUNTER — Other Ambulatory Visit: Payer: Self-pay | Admitting: Internal Medicine

## 2016-12-31 DIAGNOSIS — I10 Essential (primary) hypertension: Secondary | ICD-10-CM

## 2017-01-04 ENCOUNTER — Other Ambulatory Visit: Payer: Medicare HMO

## 2017-01-26 ENCOUNTER — Other Ambulatory Visit: Payer: Self-pay | Admitting: Internal Medicine

## 2017-02-01 ENCOUNTER — Other Ambulatory Visit: Payer: Self-pay | Admitting: Internal Medicine

## 2017-02-05 ENCOUNTER — Other Ambulatory Visit: Payer: Self-pay | Admitting: *Deleted

## 2017-02-05 MED ORDER — CITALOPRAM HYDROBROMIDE 10 MG PO TABS: 10.0000 mg | ORAL_TABLET | Freq: Every day | ORAL | 1 refills | Status: DC

## 2017-02-05 NOTE — Telephone Encounter (Signed)
[facility] [address].  

## 2017-02-12 DIAGNOSIS — Z85828 Personal history of other malignant neoplasm of skin: Secondary | ICD-10-CM | POA: Diagnosis not present

## 2017-02-12 DIAGNOSIS — C44729 Squamous cell carcinoma of skin of left lower limb, including hip: Secondary | ICD-10-CM | POA: Diagnosis not present

## 2017-02-12 DIAGNOSIS — B079 Viral wart, unspecified: Secondary | ICD-10-CM | POA: Diagnosis not present

## 2017-02-12 DIAGNOSIS — L988 Other specified disorders of the skin and subcutaneous tissue: Secondary | ICD-10-CM | POA: Diagnosis not present

## 2017-02-12 DIAGNOSIS — D485 Neoplasm of uncertain behavior of skin: Secondary | ICD-10-CM | POA: Diagnosis not present

## 2017-02-23 DIAGNOSIS — W109XXA Fall (on) (from) unspecified stairs and steps, initial encounter: Secondary | ICD-10-CM | POA: Diagnosis not present

## 2017-02-23 DIAGNOSIS — I10 Essential (primary) hypertension: Secondary | ICD-10-CM | POA: Diagnosis not present

## 2017-02-23 DIAGNOSIS — Z87891 Personal history of nicotine dependence: Secondary | ICD-10-CM | POA: Diagnosis not present

## 2017-02-23 DIAGNOSIS — Z859 Personal history of malignant neoplasm, unspecified: Secondary | ICD-10-CM | POA: Diagnosis not present

## 2017-02-23 DIAGNOSIS — M85862 Other specified disorders of bone density and structure, left lower leg: Secondary | ICD-10-CM | POA: Diagnosis not present

## 2017-02-23 DIAGNOSIS — M25562 Pain in left knee: Secondary | ICD-10-CM | POA: Diagnosis not present

## 2017-02-23 DIAGNOSIS — S8002XA Contusion of left knee, initial encounter: Secondary | ICD-10-CM | POA: Diagnosis not present

## 2017-03-01 DIAGNOSIS — I82592 Chronic embolism and thrombosis of other specified deep vein of left lower extremity: Secondary | ICD-10-CM | POA: Diagnosis not present

## 2017-03-01 DIAGNOSIS — M549 Dorsalgia, unspecified: Secondary | ICD-10-CM | POA: Diagnosis not present

## 2017-03-01 DIAGNOSIS — M7122 Synovial cyst of popliteal space [Baker], left knee: Secondary | ICD-10-CM | POA: Diagnosis not present

## 2017-03-01 DIAGNOSIS — I10 Essential (primary) hypertension: Secondary | ICD-10-CM | POA: Diagnosis not present

## 2017-03-01 DIAGNOSIS — L97828 Non-pressure chronic ulcer of other part of left lower leg with other specified severity: Secondary | ICD-10-CM | POA: Diagnosis not present

## 2017-03-01 DIAGNOSIS — G8929 Other chronic pain: Secondary | ICD-10-CM | POA: Diagnosis not present

## 2017-03-01 DIAGNOSIS — M5136 Other intervertebral disc degeneration, lumbar region: Secondary | ICD-10-CM | POA: Diagnosis not present

## 2017-03-01 DIAGNOSIS — L97818 Non-pressure chronic ulcer of other part of right lower leg with other specified severity: Secondary | ICD-10-CM | POA: Diagnosis not present

## 2017-03-01 DIAGNOSIS — M25462 Effusion, left knee: Secondary | ICD-10-CM | POA: Diagnosis not present

## 2017-03-01 DIAGNOSIS — M25569 Pain in unspecified knee: Secondary | ICD-10-CM | POA: Diagnosis not present

## 2017-03-01 DIAGNOSIS — M11262 Other chondrocalcinosis, left knee: Secondary | ICD-10-CM | POA: Diagnosis not present

## 2017-03-01 DIAGNOSIS — I824Z2 Acute embolism and thrombosis of unspecified deep veins of left distal lower extremity: Secondary | ICD-10-CM | POA: Diagnosis not present

## 2017-03-01 DIAGNOSIS — W1789XA Other fall from one level to another, initial encounter: Secondary | ICD-10-CM | POA: Diagnosis not present

## 2017-03-01 DIAGNOSIS — M25562 Pain in left knee: Secondary | ICD-10-CM | POA: Diagnosis not present

## 2017-03-01 DIAGNOSIS — M5135 Other intervertebral disc degeneration, thoracolumbar region: Secondary | ICD-10-CM | POA: Diagnosis not present

## 2017-03-01 DIAGNOSIS — M4316 Spondylolisthesis, lumbar region: Secondary | ICD-10-CM | POA: Diagnosis not present

## 2017-03-01 DIAGNOSIS — L0889 Other specified local infections of the skin and subcutaneous tissue: Secondary | ICD-10-CM | POA: Diagnosis not present

## 2017-03-06 ENCOUNTER — Other Ambulatory Visit: Payer: Self-pay | Admitting: Orthopedic Surgery

## 2017-03-06 DIAGNOSIS — W19XXXA Unspecified fall, initial encounter: Secondary | ICD-10-CM

## 2017-03-06 DIAGNOSIS — M79605 Pain in left leg: Secondary | ICD-10-CM

## 2017-03-06 DIAGNOSIS — S8992XA Unspecified injury of left lower leg, initial encounter: Secondary | ICD-10-CM | POA: Diagnosis not present

## 2017-03-07 DIAGNOSIS — M25562 Pain in left knee: Secondary | ICD-10-CM | POA: Diagnosis not present

## 2017-03-08 ENCOUNTER — Ambulatory Visit
Admission: RE | Admit: 2017-03-08 | Discharge: 2017-03-08 | Disposition: A | Discharge status: Home / Self Care | Payer: Medicare HMO | Source: Ambulatory Visit | Attending: Orthopedic Surgery | Admitting: Orthopedic Surgery

## 2017-03-08 ENCOUNTER — Inpatient Hospital Stay
Admission: RE | Admit: 2017-03-08 | Discharge: 2017-03-08 | Disposition: A | Discharge status: Home / Self Care | Payer: Medicare HMO | Source: Ambulatory Visit | Attending: Orthopedic Surgery | Admitting: Orthopedic Surgery

## 2017-03-08 DIAGNOSIS — R600 Localized edema: Secondary | ICD-10-CM | POA: Diagnosis not present

## 2017-03-08 DIAGNOSIS — W19XXXA Unspecified fall, initial encounter: Secondary | ICD-10-CM

## 2017-03-08 DIAGNOSIS — M79605 Pain in left leg: Secondary | ICD-10-CM

## 2017-03-19 DIAGNOSIS — R2689 Other abnormalities of gait and mobility: Secondary | ICD-10-CM | POA: Diagnosis not present

## 2017-03-19 DIAGNOSIS — R278 Other lack of coordination: Secondary | ICD-10-CM | POA: Diagnosis not present

## 2017-03-19 DIAGNOSIS — M1712 Unilateral primary osteoarthritis, left knee: Secondary | ICD-10-CM | POA: Diagnosis not present

## 2017-03-19 DIAGNOSIS — R2681 Unsteadiness on feet: Secondary | ICD-10-CM | POA: Diagnosis not present

## 2017-03-19 DIAGNOSIS — Z9181 History of falling: Secondary | ICD-10-CM | POA: Diagnosis not present

## 2017-03-19 DIAGNOSIS — M25562 Pain in left knee: Secondary | ICD-10-CM | POA: Diagnosis not present

## 2017-03-20 DIAGNOSIS — Z9181 History of falling: Secondary | ICD-10-CM | POA: Diagnosis not present

## 2017-03-20 DIAGNOSIS — M1712 Unilateral primary osteoarthritis, left knee: Secondary | ICD-10-CM | POA: Diagnosis not present

## 2017-03-20 DIAGNOSIS — R278 Other lack of coordination: Secondary | ICD-10-CM | POA: Diagnosis not present

## 2017-03-20 DIAGNOSIS — R2689 Other abnormalities of gait and mobility: Secondary | ICD-10-CM | POA: Diagnosis not present

## 2017-03-20 DIAGNOSIS — R2681 Unsteadiness on feet: Secondary | ICD-10-CM | POA: Diagnosis not present

## 2017-03-20 DIAGNOSIS — M25562 Pain in left knee: Secondary | ICD-10-CM | POA: Diagnosis not present

## 2017-03-22 DIAGNOSIS — R278 Other lack of coordination: Secondary | ICD-10-CM | POA: Diagnosis not present

## 2017-03-22 DIAGNOSIS — M25562 Pain in left knee: Secondary | ICD-10-CM | POA: Diagnosis not present

## 2017-03-22 DIAGNOSIS — R2689 Other abnormalities of gait and mobility: Secondary | ICD-10-CM | POA: Diagnosis not present

## 2017-03-22 DIAGNOSIS — R2681 Unsteadiness on feet: Secondary | ICD-10-CM | POA: Diagnosis not present

## 2017-03-22 DIAGNOSIS — Z9181 History of falling: Secondary | ICD-10-CM | POA: Diagnosis not present

## 2017-03-22 DIAGNOSIS — M1712 Unilateral primary osteoarthritis, left knee: Secondary | ICD-10-CM | POA: Diagnosis not present

## 2017-03-26 DIAGNOSIS — M1712 Unilateral primary osteoarthritis, left knee: Secondary | ICD-10-CM | POA: Diagnosis not present

## 2017-03-26 DIAGNOSIS — R2681 Unsteadiness on feet: Secondary | ICD-10-CM | POA: Diagnosis not present

## 2017-03-26 DIAGNOSIS — M25562 Pain in left knee: Secondary | ICD-10-CM | POA: Diagnosis not present

## 2017-03-26 DIAGNOSIS — R2689 Other abnormalities of gait and mobility: Secondary | ICD-10-CM | POA: Diagnosis not present

## 2017-03-26 DIAGNOSIS — Z9181 History of falling: Secondary | ICD-10-CM | POA: Diagnosis not present

## 2017-03-26 DIAGNOSIS — R278 Other lack of coordination: Secondary | ICD-10-CM | POA: Diagnosis not present

## 2017-03-29 DIAGNOSIS — Z9181 History of falling: Secondary | ICD-10-CM | POA: Diagnosis not present

## 2017-03-29 DIAGNOSIS — R2689 Other abnormalities of gait and mobility: Secondary | ICD-10-CM | POA: Diagnosis not present

## 2017-03-29 DIAGNOSIS — M25562 Pain in left knee: Secondary | ICD-10-CM | POA: Diagnosis not present

## 2017-03-29 DIAGNOSIS — M1712 Unilateral primary osteoarthritis, left knee: Secondary | ICD-10-CM | POA: Diagnosis not present

## 2017-03-29 DIAGNOSIS — R278 Other lack of coordination: Secondary | ICD-10-CM | POA: Diagnosis not present

## 2017-03-29 DIAGNOSIS — R2681 Unsteadiness on feet: Secondary | ICD-10-CM | POA: Diagnosis not present

## 2017-04-11 DIAGNOSIS — R2681 Unsteadiness on feet: Secondary | ICD-10-CM | POA: Diagnosis not present

## 2017-04-11 DIAGNOSIS — Z9181 History of falling: Secondary | ICD-10-CM | POA: Diagnosis not present

## 2017-04-11 DIAGNOSIS — R2689 Other abnormalities of gait and mobility: Secondary | ICD-10-CM | POA: Diagnosis not present

## 2017-04-11 DIAGNOSIS — M25562 Pain in left knee: Secondary | ICD-10-CM | POA: Diagnosis not present

## 2017-04-11 DIAGNOSIS — M1712 Unilateral primary osteoarthritis, left knee: Secondary | ICD-10-CM | POA: Diagnosis not present

## 2017-04-11 DIAGNOSIS — R278 Other lack of coordination: Secondary | ICD-10-CM | POA: Diagnosis not present

## 2017-04-13 ENCOUNTER — Ambulatory Visit
Admission: RE | Admit: 2017-04-13 | Discharge: 2017-04-13 | Disposition: A | Discharge status: Home / Self Care | Payer: Medicare HMO | Source: Ambulatory Visit | Attending: Internal Medicine | Admitting: Internal Medicine

## 2017-04-13 DIAGNOSIS — M810 Age-related osteoporosis without current pathological fracture: Secondary | ICD-10-CM | POA: Diagnosis not present

## 2017-04-13 DIAGNOSIS — Z780 Asymptomatic menopausal state: Secondary | ICD-10-CM | POA: Diagnosis not present

## 2017-04-23 ENCOUNTER — Encounter: Payer: Self-pay | Admitting: *Deleted

## 2017-04-23 DIAGNOSIS — M6389 Disorders of muscle in diseases classified elsewhere, multiple sites: Secondary | ICD-10-CM | POA: Diagnosis not present

## 2017-04-23 DIAGNOSIS — R2681 Unsteadiness on feet: Secondary | ICD-10-CM | POA: Diagnosis not present

## 2017-04-23 DIAGNOSIS — R278 Other lack of coordination: Secondary | ICD-10-CM | POA: Diagnosis not present

## 2017-04-23 DIAGNOSIS — M150 Primary generalized (osteo)arthritis: Secondary | ICD-10-CM | POA: Diagnosis not present

## 2017-04-27 DIAGNOSIS — R2681 Unsteadiness on feet: Secondary | ICD-10-CM | POA: Diagnosis not present

## 2017-04-27 DIAGNOSIS — M150 Primary generalized (osteo)arthritis: Secondary | ICD-10-CM | POA: Diagnosis not present

## 2017-04-27 DIAGNOSIS — R278 Other lack of coordination: Secondary | ICD-10-CM | POA: Diagnosis not present

## 2017-04-27 DIAGNOSIS — M6389 Disorders of muscle in diseases classified elsewhere, multiple sites: Secondary | ICD-10-CM | POA: Diagnosis not present

## 2017-05-02 ENCOUNTER — Non-Acute Institutional Stay: Payer: Medicare HMO | Admitting: Internal Medicine

## 2017-05-02 VITALS — BP 148/80 | HR 86 | Temp 98.1°F | Wt 126.0 lb

## 2017-05-02 DIAGNOSIS — R05 Cough: Secondary | ICD-10-CM | POA: Diagnosis not present

## 2017-05-02 DIAGNOSIS — R413 Other amnesia: Secondary | ICD-10-CM

## 2017-05-02 DIAGNOSIS — R053 Chronic cough: Secondary | ICD-10-CM

## 2017-05-02 DIAGNOSIS — M810 Age-related osteoporosis without current pathological fracture: Secondary | ICD-10-CM | POA: Diagnosis not present

## 2017-05-02 DIAGNOSIS — M545 Low back pain, unspecified: Secondary | ICD-10-CM

## 2017-05-02 DIAGNOSIS — M7122 Synovial cyst of popliteal space [Baker], left knee: Secondary | ICD-10-CM

## 2017-05-02 MED ORDER — DENOSUMAB 60 MG/ML ~~LOC~~ SOLN: 60.0000 mg | Freq: Once | SUBCUTANEOUS | 0 refills | Status: AC

## 2017-05-02 NOTE — Patient Instructions (Addendum)
Please stop your fosamax.  I think it may be causing your dry cough.  We will try to start you on prolia injections in place of the fosamax for your osteoporosis.  Keep your upcoming appointment.

## 2017-05-02 NOTE — Progress Notes (Signed)
Location:  [place] of Service:  Clinic (12)  Provider: Igor Bishop L. Mariea Clonts, D.O., C.M.D.  Code Status: DNR Goals of Care:  Advanced Directives 12/06/2016  Does Patient Have a Medical Advance Directive? Yes  Type of Paramedic of [place];Out of facility DNR (pink MOST or yellow form)  Does patient want to make changes to medical advance directive? -  Copy of [place] in Chart? Yes  Pre-existing out of facility DNR order (yellow form or pink MOST form) Yellow form placed in chart (order not valid for inpatient use)     Chief Complaint  Patient presents with  . Acute Visit    dry cough x couple of months    HPI: Patient is a 83 y.o. female seen today for an acute visit for dry couple for "a couple of months".  She is on cozaar.  She had been on lisinopril and had cough so we'd changed it to losartan/cozaar.  Unfortunately, it's persisted.  I'm looking through visits and it appears she's had it since January.  Her memory is poor.  She reported flu-like illness in February and had flu testing done, but this was negative.  She reported her cough having resolved in July.  She reports that now she has not coughed all day today.  She says it's been irritating, disgusting dry cough.  She says she didn't realize it was starting 2-3 mos ago.  She has not coughed since being in the room now.    She saw Dr Eulas Post in the office for right low back pain with sciatica and muscle spasms and generalized OA.  Warm compresses and xrays were ordered plus tylenol for pain, PT if not getting better.  She then called back the very same day for PT orders which were faxed to [facility].  She does not remember much about this.  She had a fall when she missed a step 9/28 (stepped back not realizing the sunken living room was behind her) and was seen in the ED for a left knee contusion while in [place] with family.  Was back in ED 10/4 for left leg pain after  second fall.  Found non-occlusive DVT in gastrocnemius vein perhaps and popliteal fossa cyst that time.  Seems they felt this was a ruptured baker's cyst rather than a clot.  She was treated with oxycodone and a wound was managed with mepitel over bactroban ointment for 2 small ulcerations of her legs.  She saw her orthopedist in f/u here in town and again was felt to have knee contusion and Baker's cyst and [facility] PT was ordered 10/17.  She is seeing Dr. Nicoletta Dress for accupuncture--her daughter is a Actuary and referred her.  She's not sure if it's helping her or not.  She felt great when she left there last time, but then she was having increased pain that evening.  Left hip is also very bothersome.    Bone density test results were sent 04/23/17.  Hips were stable, but femoral forearm is severe and hips have declined.  She thinks she has only forgotten her fosamax 3-4x since she's been on it.  She has had the dry cough.  Says it's gotten persistently drier.    Weight is stable.  Past Medical History:  Diagnosis Date  . Arthritis    "all over"  . BACK PAIN 03/08/2007  . 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  DEPRESSIVE DISORDER 12/01/2008  . Duodenal ulcer   . GI bleed 09/12/2010   Naproxen induced Endoscopy with dr Harlene Ramus   . HIATAL HERNIA 12/17/2006  . History of hiatal hernia   . HYPERLIPIDEMIA 03/09/2006  . HYPERTENSION 03/09/2006  . KNEE PAIN 05/06/2009  . Pneumonia    52yrs. ago- hosp. pneumonia  . Urinary urgency     Past Surgical History:  Procedure Laterality Date  . ABDOMINAL HYSTERECTOMY  1975  . APPLICATION OF A-CELL OF EXTREMITY Right 12/10/2014   Procedure: APPLICATION OF A-CELL OF EXTREMITY;  Surgeon: Theodoro Kos, DO;  Location: [facility];  Service: Plastics;  Laterality: Right;  . arthroscopic knee Right    x 2  . BACK SURGERY  2000   spinal fusion  . BILATERAL SALPINGECTOMY Bilateral 1989   Dr. Marianna Fuss  . CARDIAC CATHETERIZATION    . EYE SURGERY      cataracts bilateral - removed  . HERNIA REPAIR Bilateral    inguinal   . I&D EXTREMITY Right 12/10/2014   Procedure: IRRIGATION AND DEBRIDEMENT ON RIGHT LEG, ;  Surgeon: Theodoro Kos, DO;  Location: [place];  Service: Plastics;  Laterality: Right;  . I&D EXTREMITY Right 01/20/2015   Procedure: IRRIGATION AND DEBRIDEMENT RIGHT LOWER LEG WOUND, with ACELL to Donor Site, SKIN GRAFT AND VAC Placement;  Surgeon: Theodoro Kos, DO;  Location: [facility];  Service: Plastics;  Laterality: Right;  . LUMBAR LAMINECTOMY  02-17-1999   with sinal fusion L3,4,5,&S1  . moes     moses surgery on R lle  . OOPHORECTOMY  1989  . RESECTION DISTAL CLAVICAL    . ROTATOR CUFF REPAIR Right 2001   x2  . THYROIDECTOMY    . TONSILLECTOMY  1928  . TUBAL LIGATION      Allergies  Allergen Reactions  . Naproxen     Gi bleed  . Nsaids     GI BLEED  . Aspirin     GI bleed  . Ace Inhibitors     Cough   . Caffeine Other (See Comments)    Causes joints to be painful    Outpatient Encounter Medications as of 05/02/2017  Medication Sig  . acetaminophen (TYLENOL) 650 MG CR tablet Take 1 tablet (650 mg total) by mouth as needed (for pain).  Marland Kitchen alendronate (FOSAMAX) 70 MG tablet TAKE ONE TABLET BY MOUTH EVERY 7 DAYS TAKE WITH A FULL GLASS OF WATER ON AN EMPTY STOMACH  . Cholecalciferol (VITAMIN D3) 2000 UNITS TABS Take by mouth daily.  . citalopram (CELEXA) 10 MG tablet Take 1 tablet (10 mg total) by mouth daily.  . cyclobenzaprine (FLEXERIL) 10 MG tablet TAKE 0.5 TABLETS (5 MG TOTAL) BY MOUTH AT BEDTIME.  Marland Kitchen losartan (COZAAR) 50 MG tablet TAKE ONE TABLET BY MOUTH ONCE DAILY FOR BLOOD PRESSURE  . Lutein 20 MG CAPS Take 20 mg by mouth daily.   . Multiple Vitamin (MULTIVITAMIN) tablet Take 1 tablet by mouth daily.  . Probiotic Product (PROBIOTIC-10 PO) Take by mouth daily.  . [DISCONTINUED] AMBULATORY NON FORMULARY MEDICATION PT to evaluate and treat for low back pain. Dx: M54.5, M62.830   No facility-administered  encounter medications on file as of 05/02/2017.     Review of Systems:  Review of Systems  Constitutional: Positive for malaise/fatigue. Negative for chills and fever.  HENT: Negative for congestion.   Respiratory: Positive for cough. Negative for shortness of breath.   Cardiovascular: Negative for chest pain, palpitations and leg swelling.  Gastrointestinal: Negative for abdominal  pain, blood in stool and constipation.  Genitourinary: Negative for dysuria.  Musculoskeletal: Positive for back pain, falls and joint pain.  Skin: Negative for itching and rash.  Neurological: Negative for dizziness and loss of consciousness.  Psychiatric/Behavioral: Positive for memory loss. Negative for depression.    Health Maintenance  Topic Date Due  . INFLUENZA VACCINE  12/27/2016  . TETANUS/TDAP  12/12/2022  . DEXA SCAN  Completed  . PNA vac Low Risk Adult  Completed    Physical Exam: Vitals:   05/02/17 0855  BP: (!) 148/80  Pulse: 86  Temp: 98.1 F (36.7 C)  TempSrc: Oral  SpO2: 97%  Weight: 126 lb (57.2 kg)   Body mass index is 24.61 kg/m. Physical Exam  Constitutional: She is oriented to person, place, and time. She appears well-developed and well-nourished. No distress.  HENT:  Nose: Nose normal.  Mouth/Throat: Oropharynx is clear and moist.  Cardiovascular: Normal rate, regular rhythm, normal heart sounds and intact distal pulses.  Pulmonary/Chest: Effort normal and breath sounds normal. No respiratory distress. She has no wheezes. She has no rales.  Abdominal: Soft. Bowel sounds are normal.  Musculoskeletal: Normal range of motion.  Walks with rollator walker--using more faithfully now  Neurological: She is alert and oriented to person, place, and time.  But poor historian, repeats questions, has word-finding difficulty  Skin: Skin is warm and dry. Capillary refill takes less than 2 seconds.  Psychiatric: She has a normal mood and affect.    Labs reviewed: Basic Metabolic  Panel: Recent Labs    06/13/16 0800 07/17/16 0001  NA 142 137  K 4.2 4.7  CL  --  103  CO2  --  26  GLUCOSE  --  90  BUN 23* 33*  CREATININE 0.6 0.72  CALCIUM  --  9.2   Liver Function Tests: Recent Labs    06/13/16 0800  AST 14  ALT 8  ALKPHOS 62   No results for input(s): LIPASE, AMYLASE in the last 8760 hours. No results for input(s): AMMONIA in the last 8760 hours. CBC: Recent Labs    06/13/16 0800 07/17/16 0001  WBC 6.0 4.7  NEUTROABS  --  2,256  HGB 13.1 11.5*  HCT 42 35.6  MCV  --  87.7  PLT 170 175   Lipid Panel: Recent Labs    06/13/16 0800  CHOL 252*  HDL 82*  LDLCALC 153  TRIG 84   No results found for: HGBA1C  Procedures since last visit: Dg Bone Density  Result Date: 04/13/2017 EXAM: DUAL X-RAY ABSORPTIOMETRY (DXA) FOR BONE MINERAL DENSITY IMPRESSION: Referring Physician:  Jonelle Sidle Alejandro Adcox PATIENT: Name: CHAUNDRA, ABREU Patient ID: [id number] Birth Date: 1918-02-13 Height:     60.0 in. Sex: Female Measured: 04/13/2017 Weight:     128.5 lbs. Indications: Advanced Age, Bilateral Ovariectomy (65.51), Caucasian, Estrogen Deficient, Height Loss (781.91), Hysterectomy, Postmenopausal Fractures:  None Treatments: Calcium (E943.0), Fosamax, Vitamin D (E933.5) ASSESSMENT: The BMD measured at Forearm Radius 33% is 0.507 g/cm2 with a T-score of -4.3. This patient is considered osteoporotic according to [facility]) criteria. Lumbar spine was not utilized due to being excluded in prior exam. There has been a statistically significant decrease in BMD of Left Forearm since prior exam dated 04/13/2015. No statistically significant change in BMD of Dual Femurs since prior exam dated 04/13/2015. Site Region Measured Date Measured Age YA T-score BMD Significant CHANGE Left Forearm Radius 33% 04/13/2017 97.6 -4.3 0.507 g/cm2 * DualFemur Neck Left 04/13/2017 97.6 -  1.7 0.800 g/cm2 World Health Organization [facility]) criteria for post-menopausal, Caucasian  Women: Normal       T-score at or above -1 SD Osteopenia   T-score between -1 and -2.5 SD Osteoporosis T-score at or below -2.5 SD RECOMMENDATION: [place] recommends that FDA-approved medical therapies be considered in postmenopausal women and men age 61 or older with a: 1. Hip or vertebral (clinical or morphometric) fracture. 2. T-score of <-2.5 at the spine or hip. 3. Ten-year fracture probability by FRAX of 3% or greater for hip fracture or 20% or greater for major osteoporotic fracture. All treatment decisions require clinical judgment and consideration of individual patient factors, including patient preferences, co-morbidities, previous drug use, risk factors not captured in the FRAX model (e.g. falls, vitamin D deficiency, increased bone turnover, interval significant decline in bone density) and possible under - or over-estimation of fracture risk by FRAX. All patients should ensure an adequate intake of dietary calcium (1200 mg/d) and vitamin D (800 IU daily) unless contraindicated. FOLLOW-UP: People with diagnosed cases of osteoporosis or at high risk for fracture should have regular bone mineral density tests. For patients eligible for Medicare, routine testing is allowed once every 2 years. The testing frequency can be increased to one year for patients who have rapidly progressing disease, those who are receiving or discontinuing medical therapy to restore bone mass, or have additional risk factors. I have reviewed this report, and agree with the above findings. Interpreted by: Peggye Fothergill, MD, CCD (Certified Clinical Densitometrist) [facility] Radiology Electronically Signed   By: Evangeline Dakin M.D.   On: 04/13/2017 12:32    Assessment/Plan 1. Persistent dry cough -seems better actually -sounds like she had a virus that's resolving -poor historian so not really clear  2. Senile osteoporosis - reviewed bone density with her -cont vitamin D, weightbearing  exercise and start on prolia as discussed before--needs to get authorization from insurance--CMA notified - denosumab (PROLIA) 60 MG/ML SOLN injection; Inject 60 mg into the skin once for 1 dose. Administer in upper arm, thigh, or abdomen  Dispense: 1 mL; Refill: 0  3. Baker's cyst of knee, left -felt to have ruptured, has some chronic pain now related to this limiting walking some, did therapy  4. Acute right-sided low back pain without sciatica -improved after therapy  5. Memory loss, short term -and word finding issues -progressing, some age-related, but some seems to be affecting function---gets some confusion about medical info, appts etc -recommended ST for this, but she refuses  Labs/tests ordered:  No orders of the defined types were placed in this encounter.  Next appt:  06/06/2017--keep as scheduled  V[place] L. Luxe Cuadros, D.O. [facility] [address], [place] [address] Cell Phone (Mon-Fri 8am-5pm):  [phone] On Call:  [phone] & follow prompts after 5pm & weekends Office Phone:  [phone] Office Fax:  [phone]

## 2017-05-07 ENCOUNTER — Encounter: Payer: Self-pay | Admitting: Internal Medicine

## 2017-05-18 DIAGNOSIS — M150 Primary generalized (osteo)arthritis: Secondary | ICD-10-CM | POA: Diagnosis not present

## 2017-05-18 DIAGNOSIS — M6389 Disorders of muscle in diseases classified elsewhere, multiple sites: Secondary | ICD-10-CM | POA: Diagnosis not present

## 2017-05-18 DIAGNOSIS — R2681 Unsteadiness on feet: Secondary | ICD-10-CM | POA: Diagnosis not present

## 2017-05-18 DIAGNOSIS — R278 Other lack of coordination: Secondary | ICD-10-CM | POA: Diagnosis not present

## 2017-06-05 LAB — LIPID PANEL
Cholesterol: 237 — AB (ref 0–200)
HDL: 66 (ref 35–70)
LDL Cholesterol: 147
Triglycerides: 118 (ref 40–160)

## 2017-06-05 LAB — BASIC METABOLIC PANEL
BUN: 29 — AB (ref 4–21)
Creatinine: 0.5 (ref 0.5–1.1)
Glucose: 85
Potassium: 3.9 (ref 3.4–5.3)
Sodium: 142 (ref 137–147)

## 2017-06-05 LAB — HEPATIC FUNCTION PANEL
ALT: 9 (ref 7–35)
AST: 14 (ref 13–35)
Alkaline Phosphatase: 56 (ref 25–125)
Bilirubin, Total: 0.6

## 2017-06-05 LAB — CBC AND DIFFERENTIAL
HCT: 39 (ref 36–46)
Hemoglobin: 12.7 (ref 12.0–16.0)
Platelets: 185 (ref 150–399)
WBC: 5.5

## 2017-06-06 ENCOUNTER — Encounter: Payer: Self-pay | Admitting: Internal Medicine

## 2017-06-06 ENCOUNTER — Non-Acute Institutional Stay: Payer: Medicare HMO | Admitting: Internal Medicine

## 2017-06-06 VITALS — BP 148/80 | HR 88 | Temp 97.9°F | Wt 125.0 lb

## 2017-06-06 DIAGNOSIS — M1712 Unilateral primary osteoarthritis, left knee: Secondary | ICD-10-CM | POA: Diagnosis not present

## 2017-06-06 DIAGNOSIS — M810 Age-related osteoporosis without current pathological fracture: Secondary | ICD-10-CM

## 2017-06-06 DIAGNOSIS — M6281 Muscle weakness (generalized): Secondary | ICD-10-CM | POA: Diagnosis not present

## 2017-06-06 DIAGNOSIS — R053 Chronic cough: Secondary | ICD-10-CM

## 2017-06-06 DIAGNOSIS — R05 Cough: Secondary | ICD-10-CM | POA: Diagnosis not present

## 2017-06-06 NOTE — Progress Notes (Signed)
Location:  Occupational psychologist of Service:  Clinic (12)  Provider: Kalilah Barua L. Mariea Clonts, D.O., C.M.D.  Code Status: DNR Goals of Care:  Advanced Directives 12/06/2016  Does Patient Have a Medical Advance Directive? Yes  Type of Paramedic of [place];Out of facility DNR (pink MOST or yellow form)  Does patient want to make changes to medical advance directive? -  Copy of [place] in Chart? Yes  Pre-existing out of facility DNR order (yellow form or pink MOST form) Yellow form placed in chart (order not valid for inpatient use)     Chief Complaint  Patient presents with  . Medical Management of Chronic Issues    52mth follow-up    HPI: Patient is a 84 y.o. female seen today for medical management of chronic diseases.    Stopped alendronate.  After the third week, her cough did stop.  No coughing now for over a week.    BP was way up at the doctor the other day and bp was up to 190 there.    She saw orthopedics about her "knee problem" which also is due to a hip problem by Dr. Zackery Barefoot report at [facility].  She had no hip strength.              She's doing a quad stretch on the left that is helping, too.  The left knee was completely giving way 2-7x per day.  If it weren't for her walker, she would have fallen.  She is also getting a brace.    She's using a hemp-based balm from her daughter on her knee also and she gets immediate relief.    She's going to get her flu shot now at the pharmacy.    Still bothered by her umbilical hernia.    Past Medical History:  Diagnosis Date  . Arthritis    "all over"  . BACK PAIN 03/08/2007  . 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 DEPRESSIVE DISORDER 12/01/2008  . Duodenal ulcer   . GI bleed 09/12/2010   Naproxen induced Endoscopy with dr Harlene Ramus   . HIATAL HERNIA 12/17/2006  . History of hiatal hernia   . HYPERLIPIDEMIA 03/09/2006  .  HYPERTENSION 03/09/2006  . KNEE PAIN 05/06/2009  . Pneumonia    27yrs. ago- hosp. pneumonia  . Urinary urgency     Past Surgical History:  Procedure Laterality Date  . ABDOMINAL HYSTERECTOMY  1975  . APPLICATION OF A-CELL OF EXTREMITY Right 12/10/2014   Procedure: APPLICATION OF A-CELL OF EXTREMITY;  Surgeon: Theodoro Kos, DO;  Location: [facility];  Service: Plastics;  Laterality: Right;  . arthroscopic knee Right    x 2  . BACK SURGERY  2000   spinal fusion  . BILATERAL SALPINGECTOMY Bilateral 1989   Dr. Marianna Fuss  . CARDIAC CATHETERIZATION    . EYE SURGERY     cataracts bilateral - removed  . HERNIA REPAIR Bilateral    inguinal   . I&D EXTREMITY Right 12/10/2014   Procedure: IRRIGATION AND DEBRIDEMENT ON RIGHT LEG, ;  Surgeon: Theodoro Kos, DO;  Location: [place];  Service: Plastics;  Laterality: Right;  . I&D EXTREMITY Right 01/20/2015   Procedure: IRRIGATION AND DEBRIDEMENT RIGHT LOWER LEG WOUND, with ACELL to Donor Site, SKIN GRAFT AND VAC Placement;  Surgeon: Theodoro Kos, DO;  Location: [facility];  Service: Plastics;  Laterality: Right;  . LUMBAR LAMINECTOMY  02-17-1999   with sinal fusion L3,4,5,&S1  . moes     moses surgery on R lle  . OOPHORECTOMY  1989  . RESECTION DISTAL CLAVICAL    . ROTATOR CUFF REPAIR Right 2001   x2  . THYROIDECTOMY    . TONSILLECTOMY  1928  . TUBAL LIGATION      Allergies  Allergen Reactions  . Naproxen     Gi bleed  . Nsaids     GI BLEED  . Aspirin     GI bleed  . Ace Inhibitors     Cough   . Caffeine Other (See Comments)    Causes joints to be painful    Outpatient Encounter Medications as of 06/06/2017  Medication Sig  . acetaminophen (TYLENOL) 650 MG CR tablet Take 1 tablet (650 mg total) by mouth as needed (for pain).  . Cholecalciferol (VITAMIN D3) 2000 UNITS TABS Take by mouth daily.  . citalopram (CELEXA) 10 MG tablet Take 1 tablet (10 mg total) by mouth daily.  . cyclobenzaprine (FLEXERIL) 10 MG tablet TAKE 0.5 TABLETS (5 MG TOTAL)  BY MOUTH AT BEDTIME.  Marland Kitchen losartan (COZAAR) 50 MG tablet TAKE ONE TABLET BY MOUTH ONCE DAILY FOR BLOOD PRESSURE  . Lutein 20 MG CAPS Take 20 mg by mouth daily.   . Multiple Vitamin (MULTIVITAMIN) tablet Take 1 tablet by mouth daily.  . Probiotic Product (PROBIOTIC-10 PO) Take by mouth daily.   No facility-administered encounter medications on file as of 06/06/2017.     Review of Systems:  Review of Systems  Constitutional: Negative for chills, fever and malaise/fatigue.  HENT: Negative for congestion.   Eyes: Negative for blurred vision.       Glasses  Respiratory: Negative for cough and shortness of breath.   Cardiovascular: Negative for chest pain and leg swelling.  Gastrointestinal: Negative for abdominal pain.       Umbilical hernia, not painful, but enlarging  Genitourinary: Negative for dysuria.  Musculoskeletal: Positive for joint pain. Negative for falls.  Skin: Negative for itching and rash.  Neurological: Negative for dizziness, loss of consciousness and weakness.  Psychiatric/Behavioral: Positive for memory loss. Negative for depression.    Health Maintenance  Topic Date Due  . INFLUENZA VACCINE  12/27/2016  . TETANUS/TDAP  12/12/2022  . DEXA SCAN  Completed  . PNA vac Low Risk Adult  Completed    Physical Exam: Vitals:   06/06/17 1008  BP: (!) 148/80  Pulse: 88  Temp: 97.9 F (36.6 C)  TempSrc: Oral  SpO2: 97%  Weight: 125 lb (56.7 kg)   Body mass index is 24.41 kg/m. Physical Exam  Constitutional: She is oriented to person, place, and time. She appears well-developed and well-nourished. No distress.  Cardiovascular: Normal rate, regular rhythm, normal heart sounds and intact distal pulses.  Pulmonary/Chest: Effort normal and breath sounds normal. No respiratory distress.  Abdominal: Soft. Bowel sounds are normal.  Musculoskeletal: Normal range of motion. She exhibits tenderness.  Of left knee, weakness of left quadriceps  Neurological: She is alert and  oriented to person, place, and time.  Some short-term memory loss  Skin: Skin is warm and dry. Capillary refill takes less than 2 seconds.  Psychiatric: She has a normal mood and affect.    Labs reviewed: Basic Metabolic Panel: Recent Labs    06/13/16 0800 07/17/16 0001  NA 142 137  K 4.2 4.7  CL  --  103  CO2  --  26  GLUCOSE  --  90  BUN 23* 33*  CREATININE 0.6 0.72  CALCIUM  --  9.2   Liver Function Tests: Recent Labs    06/13/16 0800  AST 14  ALT 8  ALKPHOS 62   No results for input(s): LIPASE, AMYLASE in the last 8760 hours. No results for input(s): AMMONIA in the last 8760 hours. CBC: Recent Labs    06/13/16 0800 07/17/16 0001  WBC 6.0 4.7  NEUTROABS  --  2,256  HGB 13.1 11.5*  HCT 42 35.6  MCV  --  87.7  PLT 170 175   Lipid Panel: Recent Labs    06/13/16 0800  CHOL 252*  HDL 82*  LDLCALC 153  TRIG 84   Assessment/Plan 1. Primary osteoarthritis of left knee -cont to work with sports medicine on this, cont hemp balm, tylenol  2. Proximal muscle weakness -of left thigh -working on exercises  3. Senile osteoporosis -bone density consistent with OP -stopped alendronate, cont vitamin D, weightbearing exercise and begin prolia--need to check if got authorized from last month  4. Persistent dry cough -resolved with stopping alendronate   Labs/tests ordered:  No new Next appt:  4 mos med mgt Sheila Ocasio L. Jakob Kimberlin, D.O. [facility] [address], [place] [address] Cell Phone (Mon-Fri 8am-5pm):  [phone] On Call:  [phone] & follow prompts after 5pm & weekends Office Phone:  [phone] Office Fax:  [phone]

## 2017-06-13 ENCOUNTER — Ambulatory Visit (INDEPENDENT_AMBULATORY_CARE_PROVIDER_SITE_OTHER): Payer: Medicare HMO

## 2017-06-13 DIAGNOSIS — Z23 Encounter for immunization: Secondary | ICD-10-CM | POA: Diagnosis not present

## 2017-07-05 ENCOUNTER — Other Ambulatory Visit: Payer: Self-pay | Admitting: Internal Medicine

## 2017-07-05 DIAGNOSIS — I10 Essential (primary) hypertension: Secondary | ICD-10-CM

## 2017-07-17 ENCOUNTER — Non-Acute Institutional Stay: Payer: Medicare HMO

## 2017-07-17 VITALS — BP 132/72 | HR 86 | Temp 97.9°F | Ht 60.0 in | Wt 127.0 lb

## 2017-07-17 DIAGNOSIS — Z0000 Encounter for general adult medical examination without abnormal findings: Secondary | ICD-10-CM | POA: Diagnosis not present

## 2017-07-17 NOTE — Progress Notes (Signed)
Subjective:   Vanessa Mason is a 84 y.o. female who presents for Medicare Annual (Subsequent) preventive examination at [facility]  Last AWV-06/07/2016       Objective:     Vitals: BP 132/72 (BP Location: Right Arm, Patient Position: Sitting)   Pulse 86   Temp 97.9 F (36.6 C) (Oral)   Ht 5' (1.524 m)   Wt 127 lb (57.6 kg)   SpO2 95%   BMI 24.80 kg/m   Body mass index is 24.8 kg/m.  Advanced Directives 07/17/2017 12/06/2016 07/17/2016 07/17/2016 06/07/2016 02/09/2016 10/06/2015  Does Patient Have a Medical Advance Directive? Yes Yes Yes Yes Yes Yes Yes  Type of Paramedic of [place];Out of facility DNR (pink MOST or yellow form) [place];Out of facility DNR (pink MOST or yellow form) [place];Out of facility DNR (pink MOST or yellow form);Living will [place];Out of facility DNR (pink MOST or yellow form);Living will [place];Living will;Out of facility DNR (pink MOST or yellow form) [place];Living will;Out of facility DNR (pink MOST or yellow form) [place];Living will;Out of facility DNR (pink MOST or yellow form)  Does patient want to make changes to medical advance directive? No - Patient declined - - - No - Patient declined - -  Copy of [place] in Chart? Yes Yes Yes - Yes Yes Yes  Pre-existing out of facility DNR order (yellow form or pink MOST form) Yellow form placed in chart (order not valid for inpatient use) Yellow form placed in chart (order not valid for inpatient use) Yellow form placed in chart (order not valid for inpatient use) - Yellow form placed in chart (order not valid for inpatient use) Yellow form placed in chart (order not valid for inpatient use) Yellow form placed in chart (order not valid for inpatient use)    Tobacco Social History   Tobacco Use  Smoking Status  Former Smoker  . Years: 16.00  Smokeless Tobacco Never Used  Tobacco Comment   Quit 16 years     Counseling given: Not Answered Comment: Quit 16 years   Clinical Intake:  Pre-visit preparation completed: No  Pain Score: 4  Pain Type: Chronic pain Pain Location: Knee Pain Orientation: Right Pain Descriptors / Indicators: Aching Pain Onset: More than a month ago Pain Frequency: Constant     Nutritional Risks: None Diabetes: No  How often do you need to have someone help you when you read instructions, pamphlets, or other written materials from your doctor or pharmacy?: 1 - Never What is the last grade level you completed in school?: Graduate school- 1 year  Interpreter Needed?: No  Information entered by :: Tyson Dense, RN  Past Medical History:  Diagnosis Date  . Arthritis    "all over"  . BACK PAIN 03/08/2007  . 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 DEPRESSIVE DISORDER 12/01/2008  . Duodenal ulcer   . GI bleed 09/12/2010   Naproxen induced Endoscopy with dr Harlene Ramus   . HIATAL HERNIA 12/17/2006  . History of hiatal hernia   . HYPERLIPIDEMIA 03/09/2006  . HYPERTENSION 03/09/2006  . KNEE PAIN 05/06/2009  . Pneumonia    13yrs. ago- hosp. pneumonia  . Urinary urgency    Past Surgical History:  Procedure Laterality Date  . ABDOMINAL HYSTERECTOMY  1975  . APPLICATION OF A-CELL OF EXTREMITY Right  12/10/2014   Procedure: APPLICATION OF A-CELL OF EXTREMITY;  Surgeon: Theodoro Kos, DO;  Location: [facility];  Service: Plastics;  Laterality: Right;  . arthroscopic knee Right    x 2  . BACK SURGERY  2000   spinal fusion  . BILATERAL SALPINGECTOMY Bilateral 1989   Dr. Marianna Fuss  . CARDIAC CATHETERIZATION    . EYE SURGERY     cataracts bilateral - removed  . HERNIA REPAIR Bilateral    inguinal   . I&D EXTREMITY Right 12/10/2014   Procedure: IRRIGATION AND DEBRIDEMENT ON RIGHT LEG, ;  Surgeon: Theodoro Kos, DO;  Location: [facility];  Service: Plastics;   Laterality: Right;  . I&D EXTREMITY Right 01/20/2015   Procedure: IRRIGATION AND DEBRIDEMENT RIGHT LOWER LEG WOUND, with ACELL to Donor Site, SKIN GRAFT AND VAC Placement;  Surgeon: Theodoro Kos, DO;  Location: [facility];  Service: Plastics;  Laterality: Right;  . LUMBAR LAMINECTOMY  02-17-1999   with sinal fusion L3,4,5,&S1  . moes     moses surgery on R lle  . OOPHORECTOMY  1989  . RESECTION DISTAL CLAVICAL    . ROTATOR CUFF REPAIR Right 2001   x2  . THYROIDECTOMY    . TONSILLECTOMY  1928  . TUBAL LIGATION     Family History  Problem Relation Age of Onset  . Cancer Mother 81  . Cancer Brother    Social History   Socioeconomic History  . Marital status: Widowed    Spouse name: None  . Number of children: 5  . Years of education: None  . Highest education level: None  Social Needs  . Financial resource strain: Not hard at all  . Food insecurity - worry: Never true  . Food insecurity - inability: Never true  . Transportation needs - medical: No  . Transportation needs - non-medical: No  Occupational History  . None  Tobacco Use  . Smoking status: Former Smoker    Years: 16.00  . Smokeless tobacco: Never Used  . Tobacco comment: Quit 16 years  Substance and Sexual Activity  . Alcohol use: Yes    Alcohol/week: 4.2 oz    Types: 7 Glasses of wine per week    Comment: wine occasionally @ dinner  . Drug use: No  . Sexual activity: None  Other Topics Concern  . None  Social History Narrative   Lives at [facility] since 09/1992   Widow   Never smoked   Alcohol wine at night   Exercise classes 3-5 times a week    POA, DNR, Living Will    Outpatient Encounter Medications as of 07/17/2017  Medication Sig  . acetaminophen (TYLENOL) 650 MG CR tablet Take 1 tablet (650 mg total) by mouth as needed (for pain).  . Cholecalciferol (VITAMIN D3) 2000 UNITS TABS Take by mouth daily.  . citalopram (CELEXA) 10 MG tablet Take 1 tablet (10 mg total) by mouth daily.  . cyclobenzaprine  (FLEXERIL) 10 MG tablet TAKE 0.5 TABLETS (5 MG TOTAL) BY MOUTH AT BEDTIME.  Marland Kitchen losartan (COZAAR) 50 MG tablet TAKE 1 TABLET BY MOUTH ONCE DAILY FOR BLOOD PRESSURE  . Lutein 20 MG CAPS Take 20 mg by mouth daily.   . Multiple Vitamin (MULTIVITAMIN) tablet Take 1 tablet by mouth daily.  . Probiotic Product (PROBIOTIC-10 PO) Take by mouth daily.   No facility-administered encounter medications on file as of 07/17/2017.     Activities of Daily Living In your present state of health, do you have any difficulty performing  the following activities: 07/17/2017  Hearing? N  Vision? N  Difficulty concentrating or making decisions? Y  Walking or climbing stairs? Y  Dressing or bathing? N  Doing errands, shopping? N  Preparing Food and eating ? N  Using the Toilet? N  In the past six months, have you accidently leaked urine? Y  Do you have problems with loss of bowel control? N  Managing your Medications? N  Managing your Finances? N  Housekeeping or managing your Housekeeping? N  Some recent data might be hidden    Patient Care Team: Gayland Curry, DO as PCP - General (Geriatric Medicine) Ronald Lobo, MD as Consulting Physician (Gastroenterology) Vickey Huger, MD as Consulting Physician (Orthopedic Surgery) Izora Ribas as Consulting Physician (Dermatology) Darlin Coco, MD as Consulting Physician (Cardiology) Luberta Mutter, MD as Consulting Physician (Ophthalmology)    Assessment:   This is a routine wellness examination for Vanessa Mason.  Exercise Activities and Dietary recommendations Current Exercise Habits: Home exercise routine, Type of exercise: walking, Time (Minutes): 20, Frequency (Times/Week): 5, Weekly Exercise (Minutes/Week): 100, Intensity: Mild, Exercise limited by: None identified  Goals    None      Fall Risk Fall Risk  07/17/2017 06/06/2017 12/06/2016 06/07/2016 02/09/2016  Falls in the past year? Yes No No No No  Number falls in past yr: 2 or more - - - -   Injury with Fall? Yes - - - -  Comment knee - - - -   Is the patient's home free of loose throw rugs in walkways, pet beds, electrical cords, etc?   yes      Grab bars in the bathroom? yes      Handrails on the stairs?   yes      Adequate lighting?   yes  Timed Get Up and Go performed: 28 seconds, fall risk  Depression Screen PHQ 2/9 Scores 07/17/2017 06/06/2017 12/06/2016 06/07/2016  PHQ - 2 Score 0 0 0 0     Cognitive Function MMSE - Mini Mental State Exam 07/17/2017 06/07/2016  Orientation to time 5 5  Orientation to Place 5 5  Registration 3 3  Attention/ Calculation 5 5  Recall 2 1  Language- name 2 objects 2 2  Language- repeat 1 1  Language- follow 3 step command 3 3  Language- read & follow direction 1 1  Write a sentence 1 1  Copy design 1 1  Total score 29 28        Immunization History  Administered Date(s) Administered  . Influenza Split 03/08/2011, 03/05/2012  . Influenza Whole 03/08/2007, 03/02/2009, 03/29/2010  . Influenza, High Dose Seasonal PF 03/21/2013, 06/13/2017  . Influenza,inj,Quad PF,6+ Mos 02/20/2014, 02/11/2015  . Influenza-Unspecified 03/23/2016  . Pneumococcal Conjugate-13 07/30/2014  . Pneumococcal Polysaccharide-23 02/03/2002, 12/16/2009  . Td 02/03/2002  . Tdap 12/11/2012    Qualifies for Shingles Vaccine? Yes, educated and declined  Screening Tests Health Maintenance  Topic Date Due  . TETANUS/TDAP  12/12/2022  . INFLUENZA VACCINE  Completed  . DEXA SCAN  Completed  . PNA vac Low Risk Adult  Completed    Cancer Screenings: Lung: Low Dose CT Chest recommended if Age 49-80 years, 30 pack-year currently smoking OR have quit w/in 15years. Patient does not qualify. Breast:  Up to date on Mammogram? Yes   Up to date of Bone Density/Dexa? Yes Colorectal: up to date  Additional Screenings:  Hepatitis B/HIV/Syphillis: not indicated Hepatitis C Screening: declined     Plan:    I  have personally reviewed and addressed the Medicare  Annual Wellness questionnaire and have noted the following in the patient's chart:  A. Medical and social history B. Use of alcohol, tobacco or illicit drugs  C. Current medications and supplements D. Functional ability and status E.  Nutritional status F.  Physical activity G. Advance directives H. List of other physicians I.  Hospitalizations, surgeries, and ER visits in previous 12 months J.  [place] to include hearing, vision, cognitive, depression L. Referrals and appointments - none  In addition, I have reviewed and discussed with patient certain preventive protocols, quality metrics, and best practice recommendations. A written personalized care plan for preventive services as well as general preventive health recommendations were provided to patient.  See attached scanned questionnaire for additional information.   Signed,   Tyson Dense, RN Nurse Health Advisor  Patient Concerns: Pt waiting to start [facility]

## 2017-07-17 NOTE — Patient Instructions (Addendum)
Vanessa Mason , Thank you for taking time to come for your Medicare Wellness Visit. I appreciate your ongoing commitment to your health goals. Please review the following plan we discussed and let me know if I can assist you in the future.   Screening recommendations/referrals: Colonoscopy excluded, you are over age 82 Mammogram excluded, you are over age 63 Bone Density up to date Recommended yearly ophthalmology/optometry visit for glaucoma screening and checkup Recommended yearly dental visit for hygiene and checkup  Vaccinations: Influenza vaccine up to date, due 2019 fall season Pneumococcal vaccine up to date Tdap vaccine up to date, due 12/12/2022 Shingles vaccine due, declined    Advanced directives: In Chart  Conditions/risks identified: none  Next appointment: Dr. Mariea Clonts 10/10/2017   Preventive Care 65 Years and Older, Female Preventive care refers to lifestyle choices and visits with your health care provider that can promote health and wellness. What does preventive care include?  A yearly physical exam. This is also called an annual well check.  Dental exams once or twice a year.  Routine eye exams. Ask your health care provider how often you should have your eyes checked.  Personal lifestyle choices, including:  Daily care of your teeth and gums.  Regular physical activity.  Eating a healthy diet.  Avoiding tobacco and drug use.  Limiting alcohol use.  Practicing safe sex.  Taking low-dose aspirin every day.  Taking vitamin and mineral supplements as recommended by your health care provider. What happens during an annual well check? The services and screenings done by your health care provider during your annual well check will depend on your age, overall health, lifestyle risk factors, and family history of disease. Counseling  Your health care provider may ask you questions about your:  Alcohol use.  Tobacco use.  Drug use.  Emotional  well-being.  Home and relationship well-being.  Sexual activity.  Eating habits.  History of falls.  Memory and ability to understand (cognition).  Work and work Statistician.  Reproductive health. Screening  You may have the following tests or measurements:  Height, weight, and BMI.  Blood pressure.  Lipid and cholesterol levels. These may be checked every 5 years, or more frequently if you are over 74 years old.  Skin check.  Lung cancer screening. You may have this screening every year starting at age 74 if you have a 30-pack-year history of smoking and currently smoke or have quit within the past 15 years.  Fecal occult blood test (FOBT) of the stool. You may have this test every year starting at age 32.  Flexible sigmoidoscopy or colonoscopy. You may have a sigmoidoscopy every 5 years or a colonoscopy every 10 years starting at age 61.  Hepatitis C blood test.  Hepatitis B blood test.  Sexually transmitted disease (STD) testing.  Diabetes screening. This is done by checking your blood sugar (glucose) after you have not eaten for a while (fasting). You may have this done every 1-3 years.  Bone density scan. This is done to screen for osteoporosis. You may have this done starting at age 75.  Mammogram. This may be done every 1-2 years. Talk to your health care provider about how often you should have regular mammograms. Talk with your health care provider about your test results, treatment options, and if necessary, the need for more tests. Vaccines  Your health care provider may recommend certain vaccines, such as:  Influenza vaccine. This is recommended every year.  Tetanus, diphtheria, and acellular pertussis (Tdap,  Td) vaccine. You may need a Td booster every 10 years.  Zoster vaccine. You may need this after age 20.  Pneumococcal 13-valent conjugate (PCV13) vaccine. One dose is recommended after age 70.  Pneumococcal polysaccharide (PPSV23) vaccine. One  dose is recommended after age 52. Talk to your health care provider about which screenings and vaccines you need and how often you need them. This information is not intended to replace advice given to you by your health care provider. Make sure you discuss any questions you have with your health care provider. Document Released: 06/11/2015 Document Revised: 02/02/2016 Document Reviewed: 03/16/2015 Elsevier Interactive Patient Education  2017 [employer] Prevention in the Home Falls can cause injuries. They can happen to people of all ages. There are many things you can do to make your home safe and to help prevent falls. What can I do on the outside of my home?  Regularly fix the edges of walkways and driveways and fix any cracks.  Remove anything that might make you trip as you walk through a door, such as a raised step or threshold.  Trim any bushes or trees on the path to your home.  Use bright outdoor lighting.  Clear any walking paths of anything that might make someone trip, such as rocks or tools.  Regularly check to see if handrails are loose or broken. Make sure that both sides of any steps have handrails.  Any raised decks and porches should have guardrails on the edges.  Have any leaves, snow, or ice cleared regularly.  Use sand or salt on walking paths during winter.  Clean up any spills in your garage right away. This includes oil or grease spills. What can I do in the bathroom?  Use night lights.  Install grab bars by the toilet and in the tub and shower. Do not use towel bars as grab bars.  Use non-skid mats or decals in the tub or shower.  If you need to sit down in the shower, use a plastic, non-slip stool.  Keep the floor dry. Clean up any water that spills on the floor as soon as it happens.  Remove soap buildup in the tub or shower regularly.  Attach bath mats securely with double-sided non-slip rug tape.  Do not have throw rugs and other  things on the floor that can make you trip. What can I do in the bedroom?  Use night lights.  Make sure that you have a light by your bed that is easy to reach.  Do not use any sheets or blankets that are too big for your bed. They should not hang down onto the floor.  Have a firm chair that has side arms. You can use this for support while you get dressed.  Do not have throw rugs and other things on the floor that can make you trip. What can I do in the kitchen?  Clean up any spills right away.  Avoid walking on wet floors.  Keep items that you use a lot in easy-to-reach places.  If you need to reach something above you, use a strong step stool that has a grab bar.  Keep electrical cords out of the way.  Do not use floor polish or wax that makes floors slippery. If you must use wax, use non-skid floor wax.  Do not have throw rugs and other things on the floor that can make you trip. What can I do with my stairs?  Do not  leave any items on the stairs.  Make sure that there are handrails on both sides of the stairs and use them. Fix handrails that are broken or loose. Make sure that handrails are as long as the stairways.  Check any carpeting to make sure that it is firmly attached to the stairs. Fix any carpet that is loose or worn.  Avoid having throw rugs at the top or bottom of the stairs. If you do have throw rugs, attach them to the floor with carpet tape.  Make sure that you have a light switch at the top of the stairs and the bottom of the stairs. If you do not have them, ask someone to add them for you. What else can I do to help prevent falls?  Wear shoes that:  Do not have high heels.  Have rubber bottoms.  Are comfortable and fit you well.  Are closed at the toe. Do not wear sandals.  If you use a stepladder:  Make sure that it is fully opened. Do not climb a closed stepladder.  Make sure that both sides of the stepladder are locked into place.  Ask  someone to hold it for you, if possible.  Clearly mark and make sure that you can see:  Any grab bars or handrails.  First and last steps.  Where the edge of each step is.  Use tools that help you move around (mobility aids) if they are needed. These include:  Canes.  Walkers.  Scooters.  Crutches.  Turn on the lights when you go into a dark area. Replace any light bulbs as soon as they burn out.  Set up your furniture so you have a clear path. Avoid moving your furniture around.  If any of your floors are uneven, fix them.  If there are any pets around you, be aware of where they are.  Review your medicines with your doctor. Some medicines can make you feel dizzy. This can increase your chance of falling. Ask your doctor what other things that you can do to help prevent falls. This information is not intended to replace advice given to you by your health care provider. Make sure you discuss any questions you have with your health care provider. Document Released: 03/11/2009 Document Revised: 10/21/2015 Document Reviewed: 06/19/2014 Elsevier Interactive Patient Education  2017 [employer].

## 2017-07-30 ENCOUNTER — Other Ambulatory Visit: Payer: Self-pay | Admitting: Internal Medicine

## 2017-09-10 ENCOUNTER — Other Ambulatory Visit: Payer: Self-pay | Admitting: *Deleted

## 2017-09-12 ENCOUNTER — Non-Acute Institutional Stay: Payer: Medicare HMO | Admitting: *Deleted

## 2017-09-12 DIAGNOSIS — M810 Age-related osteoporosis without current pathological fracture: Secondary | ICD-10-CM | POA: Diagnosis not present

## 2017-09-12 MED ORDER — DENOSUMAB 60 MG/ML ~~LOC~~ SOSY
60.0000 mg | PREFILLED_SYRINGE | Freq: Once | SUBCUTANEOUS | Status: AC
  Administered 2017-09-12: 60 mg via SUBCUTANEOUS

## 2017-09-12 NOTE — Progress Notes (Signed)
Nurse visit for Prolia Injection

## 2017-10-04 ENCOUNTER — Ambulatory Visit: Payer: Medicare HMO | Admitting: Internal Medicine

## 2017-10-10 ENCOUNTER — Encounter: Payer: Self-pay | Admitting: Internal Medicine

## 2017-12-28 ENCOUNTER — Other Ambulatory Visit: Payer: Self-pay | Admitting: Internal Medicine

## 2017-12-28 DIAGNOSIS — I10 Essential (primary) hypertension: Secondary | ICD-10-CM

## 2018-02-01 ENCOUNTER — Other Ambulatory Visit: Payer: Self-pay | Admitting: Internal Medicine

## 2018-03-19 ENCOUNTER — Telehealth: Payer: Self-pay | Admitting: *Deleted

## 2018-03-19 ENCOUNTER — Ambulatory Visit: Payer: Medicare HMO | Admitting: *Deleted

## 2018-03-19 DIAGNOSIS — M810 Age-related osteoporosis without current pathological fracture: Secondary | ICD-10-CM

## 2018-03-19 MED ORDER — DENOSUMAB 60 MG/ML ~~LOC~~ SOSY
60.0000 mg | PREFILLED_SYRINGE | Freq: Once | SUBCUTANEOUS | Status: AC
  Administered 2018-03-19: 60 mg via SUBCUTANEOUS

## 2018-03-19 NOTE — Telephone Encounter (Signed)
Spoke with patient and advised results, she will think about the melatonin and give [place] a call back when she's ready to take it.

## 2018-03-19 NOTE — Telephone Encounter (Signed)
It is a muscle relaxant. It's not intended to be used for sleep.  With her fall risk, it's concerning for her to take this at 84 years old.  It's on a list of meds that are not good as we get older.  I want her to stay spunky and not to fall.  Perhaps, she could try melatonin 5mg  for sleep--take one hour before bedtime.

## 2018-03-19 NOTE — Telephone Encounter (Signed)
Pt questioning her rx for Cyclobenzarine 10, she's been taking this for years for sleep and when she's not taking it she has bad dreams. Med was started in 2012, pt is concerned because its a short term medication

## 2018-03-28 ENCOUNTER — Encounter: Payer: Self-pay | Admitting: Internal Medicine

## 2018-03-29 ENCOUNTER — Other Ambulatory Visit: Payer: Self-pay | Admitting: *Deleted

## 2018-03-29 DIAGNOSIS — I10 Essential (primary) hypertension: Secondary | ICD-10-CM

## 2018-03-29 MED ORDER — CITALOPRAM HYDROBROMIDE 10 MG PO TABS: 10.0000 mg | ORAL_TABLET | Freq: Every day | ORAL | 0 refills | Status: DC

## 2018-03-29 MED ORDER — LOSARTAN POTASSIUM 50 MG PO TABS: 50.0000 mg | ORAL_TABLET | Freq: Every day | ORAL | 0 refills | Status: DC

## 2018-03-29 NOTE — Telephone Encounter (Signed)
Emergency rx sent to pt out of town, per Dr. Mariea Clonts.

## 2018-04-03 ENCOUNTER — Non-Acute Institutional Stay: Payer: Medicare HMO | Admitting: Internal Medicine

## 2018-04-03 ENCOUNTER — Encounter: Payer: Self-pay | Admitting: Internal Medicine

## 2018-04-03 VITALS — BP 138/80 | HR 91 | Temp 98.4°F | Wt 126.0 lb

## 2018-04-03 DIAGNOSIS — I10 Essential (primary) hypertension: Secondary | ICD-10-CM | POA: Diagnosis not present

## 2018-04-03 DIAGNOSIS — M1712 Unilateral primary osteoarthritis, left knee: Secondary | ICD-10-CM | POA: Diagnosis not present

## 2018-04-03 DIAGNOSIS — G3184 Mild cognitive impairment, so stated: Secondary | ICD-10-CM

## 2018-04-03 DIAGNOSIS — F325 Major depressive disorder, single episode, in full remission: Secondary | ICD-10-CM | POA: Diagnosis not present

## 2018-04-03 DIAGNOSIS — M810 Age-related osteoporosis without current pathological fracture: Secondary | ICD-10-CM

## 2018-04-03 NOTE — Progress Notes (Signed)
Location:  [facility] clinic Provider:  Shakemia Madera L. Mariea Clonts, D.O., C.M.D.  Code Status: DNR Goals of Care:  Advanced Directives 07/17/2017  Does Patient Have a Medical Advance Directive? Yes  Type of Paramedic of [place];Out of facility DNR (pink MOST or yellow form)  Does patient want to make changes to medical advance directive? No - Patient declined  Copy of [place] in Chart? Yes  Pre-existing out of facility DNR order (yellow form or pink MOST form) Yellow form placed in chart (order not valid for inpatient use)   Chief Complaint  Patient presents with  . Medical Management of Chronic Issues    10 month follow-up    HPI: Patient is a 84 y.o. female seen today for medical management of chronic diseases.    BP at goal.  Hasn't been back to Dr. Percival Spanish.    She gave up her car keys on her own.  She has a perfect driving record.  Doesn't want to spoil that.  Will get her rides through [place].  Once in a while, she needs just one little thing and that's annoying.    One of her sons had an accident when in [place].  He had a fall while in a hotel hallway--unclear if syncope led to fall or vice versa.  He cracked his skull on the door knob.  His pulse was down to 20.  He wound up getting a pacemaker in [place].  He now has angled double vision and cannot drive.  This is her son that's a musician and would come here annually.    Asks about her meds:  Takes only celexa for depression, but her daughter has been opposed to stopping it--we did try before and anxiety worsened.  She is on losartan for her blood pressure which has been effective.     Left knee pain is well controlled--uses a laser on accupuncture points--puts it on 8 spots when it's bothering her.  Uses brace.    Had been taking her cyclobenzaprine.  She used to struggle to go back to sleep at night and apparently was using it for that though.  She is no longer using the  cyclobenzaprine and gets up to urinate and goes right back to sleep.    Does drink her single measured glass of wine nightly.    She is following a water drinking schedule.  She does not like to drink it, but her daughter keeps encouraging this also.  She agrees to 8 glasses per day.  She is going to put the total in her filtered water pitcher.  She just started this.  Hasn't gotten all of it in thus far b/c it takes her until 11am to get rolling.  Bowels were moving better when eating more wholesome foods, but gets constipated here.  She is going to go back to prunes instead of "calm" which is hard to regulate.    She still struggles to find words.    She is on prolia injections for her osteoporosis and vitamin D3 at least 2000 units daily.  She may have 3000 units capsules.   Past Medical History:  Diagnosis Date  . Arthritis    "all over"  . BACK PAIN 03/08/2007  . 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 DEPRESSIVE DISORDER 12/01/2008  . Duodenal ulcer   . GI bleed 09/12/2010   Naproxen induced Endoscopy with dr Harlene Ramus   .  HIATAL HERNIA 12/17/2006  . History of hiatal hernia   . HYPERLIPIDEMIA 03/09/2006  . HYPERTENSION 03/09/2006  . KNEE PAIN 05/06/2009  . Pneumonia    25yrs. ago- hosp. pneumonia  . Urinary urgency     Past Surgical History:  Procedure Laterality Date  . ABDOMINAL HYSTERECTOMY  1975  . APPLICATION OF A-CELL OF EXTREMITY Right 12/10/2014   Procedure: APPLICATION OF A-CELL OF EXTREMITY;  Surgeon: Theodoro Kos, DO;  Location: [place];  Service: Plastics;  Laterality: Right;  . arthroscopic knee Right    x 2  . BACK SURGERY  2000   spinal fusion  . BILATERAL SALPINGECTOMY Bilateral 1989   Dr. Marianna Fuss  . CARDIAC CATHETERIZATION    . EYE SURGERY     cataracts bilateral - removed  . HERNIA REPAIR Bilateral    inguinal   . I&D EXTREMITY Right 12/10/2014   Procedure: IRRIGATION AND DEBRIDEMENT ON RIGHT LEG, ;  Surgeon: Theodoro Kos, DO;   Location: [place];  Service: Plastics;  Laterality: Right;  . I&D EXTREMITY Right 01/20/2015   Procedure: IRRIGATION AND DEBRIDEMENT RIGHT LOWER LEG WOUND, with ACELL to Donor Site, SKIN GRAFT AND VAC Placement;  Surgeon: Theodoro Kos, DO;  Location: [place];  Service: Plastics;  Laterality: Right;  . LUMBAR LAMINECTOMY  02-17-1999   with sinal fusion L3,4,5,&S1  . moes     moses surgery on R lle  . OOPHORECTOMY  1989  . RESECTION DISTAL CLAVICAL    . ROTATOR CUFF REPAIR Right 2001   x2  . THYROIDECTOMY    . TONSILLECTOMY  1928  . TUBAL LIGATION      Allergies  Allergen Reactions  . Naproxen     Gi bleed  . Nsaids     GI BLEED  . Aspirin     GI bleed  . Ace Inhibitors     Cough   . Caffeine Other (See Comments)    Causes joints to be painful    Outpatient Encounter Medications as of 04/03/2018  Medication Sig  . acetaminophen (TYLENOL) 650 MG CR tablet Take 1 tablet (650 mg total) by mouth as needed (for pain).  . Cholecalciferol (VITAMIN D3) 2000 UNITS TABS Take by mouth daily.  . citalopram (CELEXA) 10 MG tablet Take 1 tablet (10 mg total) by mouth daily.  Marland Kitchen losartan (COZAAR) 50 MG tablet Take 1 tablet (50 mg total) by mouth daily.  . Lutein 20 MG CAPS Take 20 mg by mouth daily.   . Multiple Vitamin (MULTIVITAMIN) tablet Take 1 tablet by mouth daily.  . Probiotic Product (PROBIOTIC-10 PO) Take by mouth daily.   No facility-administered encounter medications on file as of 04/03/2018.     Review of Systems:  Review of Systems  Constitutional: Negative for chills, fever and malaise/fatigue.  HENT: Positive for hearing loss.   Eyes: Negative for blurred vision.       Glasses  Respiratory: Negative for cough and shortness of breath.   Cardiovascular: Negative for chest pain, palpitations and leg swelling.  Gastrointestinal: Positive for constipation. Negative for abdominal pain, blood in stool, diarrhea and melena.  Genitourinary: Negative for dysuria.  Musculoskeletal:  Positive for joint pain. Negative for falls.  Skin:       Right ear with dry scaly area  Neurological: Negative for dizziness and loss of consciousness.  Endo/Heme/Allergies: Bruises/bleeds easily.  Psychiatric/Behavioral: Positive for memory loss. Negative for depression. The patient is nervous/anxious. The patient does not have insomnia.     Health Maintenance  Topic Date Due  . TETANUS/TDAP  12/12/2022  . INFLUENZA VACCINE  Completed  . DEXA SCAN  Completed  . PNA vac Low Risk Adult  Completed    Physical Exam: Vitals:   04/03/18 0900  BP: 138/80  Pulse: 91  Temp: 98.4 F (36.9 C)  TempSrc: Oral  SpO2: 98%  Weight: 126 lb (57.2 kg)   Body mass index is 24.61 kg/m. Physical Exam  Constitutional: She is oriented to person, place, and time. She appears well-developed and well-nourished. No distress.  Eyes:  glasses  Cardiovascular: Normal rate, regular rhythm, normal heart sounds and intact distal pulses.  Pulmonary/Chest: Effort normal and breath sounds normal. No respiratory distress.  Abdominal: Soft. Bowel sounds are normal.  Musculoskeletal: Normal range of motion.  Brace on left knee, walks with rollator walker  Neurological: She is alert and oriented to person, place, and time.  Some mild word-finding issues  Skin: Skin is warm and dry. Capillary refill takes less than 2 seconds.  Psychiatric: She has a normal mood and affect.    Labs reviewed: Basic Metabolic Panel: Recent Labs    06/05/17 0700  NA 142  K 3.9  BUN 29*  CREATININE 0.5   Liver Function Tests: Recent Labs    06/05/17 0700  AST 14  ALT 9  ALKPHOS 56   No results for input(s): LIPASE, AMYLASE in the last 8760 hours. No results for input(s): AMMONIA in the last 8760 hours. CBC: Recent Labs    06/05/17 0700  WBC 5.5  HGB 12.7  HCT 39  PLT 185   Lipid Panel: Recent Labs    06/05/17 0700  CHOL 237*  HDL 66  LDLCALC 147  TRIG 118   Assessment/Plan 1. Essential  hypertension -bp satisfactory given age and fall risk, cont losartan which she tolerates well--apparently hers was not part of recall  2. Major depressive disorder with single episode, in full remission ([place]) -cont her celexa ongoing b/c attempt to taper before was not tolerated and her daughter is concerned about how she will do without this -does get increased anxiety without it based on past attempt  3. Primary osteoarthritis of left knee -cont tylenol prn, brace, topicals   4. Mild cognitive impairment with memory loss -with some word-finding difficulty; has decided to give up driving; still managing very well, on minimal meds  5. Senile osteoporosis -cont prolia, vitamin D3   Labs/tests ordered:   Cbc, cmp, flp before Next appt:  09/24/2018 for CPE   Nylee Barbuto L. Shakisha Abend, D.O. [facility] [address], [place] [address] Cell Phone (Mon-Fri 8am-5pm):  [phone] On Call:  [phone] & follow prompts after 5pm & weekends Office Phone:  [phone] Office Fax:  [phone]

## 2018-04-05 ENCOUNTER — Telehealth: Payer: Self-pay | Admitting: *Deleted

## 2018-04-05 NOTE — Telephone Encounter (Signed)
Patient called and stated that she forgot to discuss these concerns with you at last OV.   1. Stated that she got home after seeing you and looked at her Vitamin D3 bottle and it states 5000units. Patient stated that you told her that just 3000units was more than enough. She is concerned because she has been taking 5000 units for quite a while now.   2. Very concerned that she cannot find her words that she wants to use until after the fact. States this really bugs her. Wants to know if OTC Prevagen will help her or if she is beyond help.   3. States she has a big umbilical hernia and wonders if she should wear a Hernia waist band or not. States she is sure they will not do surgery on her due to age.   Please Advise.

## 2018-04-08 NOTE — Telephone Encounter (Signed)
Spoke with patient and advised results Lab order sent to [facility]

## 2018-04-08 NOTE — Telephone Encounter (Signed)
1. Her dose of Vitamin 5000 units daily is fine.  She may continue this.  Let's check a vitamin D level at [facility] tomorrow morning. 2.  I do not recommend prevagen.  It has not been studied formally by the FDA.  It's also very expensive and not proven.  Her changes seem to be related to her advanced age. 3.  The hernia when we last checked it was really not that big.  I don't think it necessitates a hernia waist band.

## 2018-04-09 ENCOUNTER — Encounter: Payer: Self-pay | Admitting: Internal Medicine

## 2018-05-29 HISTORY — PX: SKIN GRAFT: SHX250

## 2018-05-30 DIAGNOSIS — H6123 Impacted cerumen, bilateral: Secondary | ICD-10-CM | POA: Diagnosis not present

## 2018-06-18 DIAGNOSIS — R69 Illness, unspecified: Secondary | ICD-10-CM | POA: Diagnosis not present

## 2018-06-18 DIAGNOSIS — M1990 Unspecified osteoarthritis, unspecified site: Secondary | ICD-10-CM | POA: Diagnosis not present

## 2018-06-18 DIAGNOSIS — K5900 Constipation, unspecified: Secondary | ICD-10-CM | POA: Diagnosis not present

## 2018-06-18 DIAGNOSIS — H547 Unspecified visual loss: Secondary | ICD-10-CM | POA: Diagnosis not present

## 2018-06-18 DIAGNOSIS — F4323 Adjustment disorder with mixed anxiety and depressed mood: Secondary | ICD-10-CM | POA: Diagnosis not present

## 2018-06-18 DIAGNOSIS — R269 Unspecified abnormalities of gait and mobility: Secondary | ICD-10-CM | POA: Diagnosis not present

## 2018-06-18 DIAGNOSIS — H269 Unspecified cataract: Secondary | ICD-10-CM | POA: Diagnosis not present

## 2018-06-18 DIAGNOSIS — G8929 Other chronic pain: Secondary | ICD-10-CM | POA: Diagnosis not present

## 2018-06-18 DIAGNOSIS — I10 Essential (primary) hypertension: Secondary | ICD-10-CM | POA: Diagnosis not present

## 2018-06-18 DIAGNOSIS — M810 Age-related osteoporosis without current pathological fracture: Secondary | ICD-10-CM | POA: Diagnosis not present

## 2018-06-27 ENCOUNTER — Ambulatory Visit: Payer: Medicare HMO | Admitting: Internal Medicine

## 2018-06-27 ENCOUNTER — Ambulatory Visit (INDEPENDENT_AMBULATORY_CARE_PROVIDER_SITE_OTHER): Payer: Medicare HMO | Admitting: Nurse Practitioner

## 2018-06-27 ENCOUNTER — Encounter: Payer: Self-pay | Admitting: Nurse Practitioner

## 2018-06-27 VITALS — BP 138/80 | HR 74 | Temp 98.3°F | Resp 10 | Ht 60.0 in | Wt 125.0 lb

## 2018-06-27 DIAGNOSIS — J209 Acute bronchitis, unspecified: Secondary | ICD-10-CM

## 2018-06-27 MED ORDER — DOXYCYCLINE HYCLATE 100 MG PO TABS: 100.0000 mg | ORAL_TABLET | Freq: Two times a day (BID) | ORAL | 0 refills | Status: DC

## 2018-06-27 NOTE — Patient Instructions (Addendum)
To start mucinex DM 1-2 tablets twice daily for 1 week- take with a full glass of water To doxycyline 100 mg by mouth twice daily for 1 week take with food  to use probiotic twice daily for 10 days.  Increase fluid intake- lots of water

## 2018-06-27 NOTE — Progress Notes (Signed)
Careteam: Patient Care Team: Gayland Curry, DO as PCP - General (Geriatric Medicine) Ronald Lobo, MD as Consulting Physician (Gastroenterology) Vickey Huger, MD as Consulting Physician (Orthopedic Surgery) Izora Ribas as Consulting Physician (Dermatology) Darlin Coco, MD as Consulting Physician (Cardiology) Luberta Mutter, MD as Consulting Physician (Ophthalmology)  Advanced Directive information    Allergies  Allergen Reactions  . Naproxen     Gi bleed  . Nsaids     GI BLEED  . Aspirin     GI bleed  . Ace Inhibitors     Cough   . Caffeine Other (See Comments)    Causes joints to be painful    Chief Complaint  Patient presents with  . Acute Visit    URI- patient with congestion x 1 month     HPI: Patient is a 85 y.o. female seen in the office today for ongoing congestion. Pt with a hx of anxiety, htn, osteoporosis, GI bleed, hyperlipidemia. Started having a deep cough about a month ago. No sore throat or "cold"  Was around daughter who was sick.  Report green sputum.  Overall it has improved when she does not talk.  No shortness of breath, chest pain or fever.    Review of Systems:  Review of Systems  Constitutional: Positive for malaise/fatigue. Negative for chills and fever.  HENT: Negative for congestion, ear pain, sinus pain and sore throat.   Respiratory: Positive for cough, sputum production and wheezing. Negative for shortness of breath.   Cardiovascular: Negative for chest pain, palpitations and leg swelling.    Past Medical History:  Diagnosis Date  . Arthritis    "all over"  . BACK PAIN 03/08/2007  . 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 DEPRESSIVE DISORDER 12/01/2008  . Duodenal ulcer   . GI bleed 09/12/2010   Naproxen induced Endoscopy with dr Harlene Ramus   . HIATAL HERNIA 12/17/2006  . History of hiatal hernia   . HYPERLIPIDEMIA 03/09/2006  . HYPERTENSION 03/09/2006  . KNEE PAIN 05/06/2009  . Pneumonia     69yrs. ago- hosp. pneumonia  . Urinary urgency    Past Surgical History:  Procedure Laterality Date  . ABDOMINAL HYSTERECTOMY  1975  . APPLICATION OF A-CELL OF EXTREMITY Right 12/10/2014   Procedure: APPLICATION OF A-CELL OF EXTREMITY;  Surgeon: Theodoro Kos, DO;  Location: [facility];  Service: Plastics;  Laterality: Right;  . arthroscopic knee Right    x 2  . BACK SURGERY  2000   spinal fusion  . BILATERAL SALPINGECTOMY Bilateral 1989   Dr. Marianna Fuss  . CARDIAC CATHETERIZATION    . EYE SURGERY     cataracts bilateral - removed  . HERNIA REPAIR Bilateral    inguinal   . I&D EXTREMITY Right 12/10/2014   Procedure: IRRIGATION AND DEBRIDEMENT ON RIGHT LEG, ;  Surgeon: Theodoro Kos, DO;  Location: [facility];  Service: Plastics;  Laterality: Right;  . I&D EXTREMITY Right 01/20/2015   Procedure: IRRIGATION AND DEBRIDEMENT RIGHT LOWER LEG WOUND, with ACELL to Donor Site, SKIN GRAFT AND VAC Placement;  Surgeon: Theodoro Kos, DO;  Location: [facility];  Service: Plastics;  Laterality: Right;  . LUMBAR LAMINECTOMY  02-17-1999   with sinal fusion L3,4,5,&S1  . moes     moses surgery on R lle  . OOPHORECTOMY  1989  . RESECTION DISTAL CLAVICAL    . ROTATOR CUFF REPAIR Right 2001   x2  . SKIN GRAFT Right 05/29/2018  .  THYROIDECTOMY    . TONSILLECTOMY  1928  . TUBAL LIGATION     Social History:   reports that she has quit smoking. She quit after 16.00 years of use. She has never used smokeless tobacco. She reports current alcohol use of about 7.0 standard drinks of alcohol per week. She reports that she does not use drugs.  Family History  Problem Relation Age of Onset  . Cancer Mother 15  . Cancer Brother     Medications: Patient's Medications  New Prescriptions   No medications on file  Previous Medications   ACETAMINOPHEN (TYLENOL) 650 MG CR TABLET    Take 1 tablet (650 mg total) by mouth as needed (for pain).   CITALOPRAM (CELEXA) 10 MG TABLET    Take 1 tablet (10 mg total) by mouth daily.    LOSARTAN (COZAAR) 50 MG TABLET    Take 1 tablet (50 mg total) by mouth daily.   LUTEIN 20 MG CAPS    Take 20 mg by mouth daily.   Modified Medications   No medications on file  Discontinued Medications   CHOLECALCIFEROL (VITAMIN D3) 2000 UNITS TABS    Take by mouth daily.   MULTIPLE VITAMIN (MULTIVITAMIN) TABLET    Take 1 tablet by mouth daily.     Physical Exam:  Vitals:   06/27/18 1540  BP: 138/80  Pulse: 74  Resp: 10  Temp: 98.3 F (36.8 C)  TempSrc: Oral  SpO2: 96%  Weight: 125 lb (56.7 kg)  Height: 5' (1.524 m)   Body mass index is 24.41 kg/m.  Physical Exam Constitutional:      General: She is not in acute distress.    Appearance: She is well-developed.  HENT:     Head: Normocephalic.     Right Ear: Tympanic membrane and ear canal normal.     Left Ear: Tympanic membrane and ear canal normal.     Nose: Nose normal.     Mouth/Throat:     Mouth: Mucous membranes are moist.  Eyes:     Pupils: Pupils are equal, round, and reactive to light.     Comments: glasses  Cardiovascular:     Rate and Rhythm: Normal rate and regular rhythm.     Heart sounds: Normal heart sounds.  Pulmonary:     Effort: Pulmonary effort is normal. No respiratory distress.     Breath sounds: Normal breath sounds. No wheezing, rhonchi or rales.  Chest:     Chest wall: No tenderness.  Abdominal:     General: Bowel sounds are normal.     Palpations: Abdomen is soft.  Musculoskeletal: Normal range of motion.     Comments: Brace on left knee, walks with rollator walker  Skin:    General: Skin is warm and dry.     Capillary Refill: Capillary refill takes less than 2 seconds.  Neurological:     Mental Status: She is alert and oriented to person, place, and time.     Labs reviewed: Basic Metabolic Panel: No results for input(s): NA, K, CL, CO2, GLUCOSE, BUN, CREATININE, CALCIUM, MG, PHOS, TSH in the last 8760 hours. Liver Function Tests: No results for input(s): AST, ALT, ALKPHOS,  BILITOT, PROT, ALBUMIN in the last 8760 hours. No results for input(s): LIPASE, AMYLASE in the last 8760 hours. No results for input(s): AMMONIA in the last 8760 hours. CBC: No results for input(s): WBC, NEUTROABS, HGB, HCT, MCV, PLT in the last 8760 hours. Lipid Panel: No results for input(s): CHOL,  HDL, LDLCALC, TRIG, CHOLHDL, LDLDIRECT in the last 8760 hours. TSH: No results for input(s): TSH in the last 8760 hours. A1C: No results found for: HGBA1C   Assessment/Plan 1. Acute bronchitis, unspecified organism -to increase fluid intake -mucinex DM by mouth 1-2 tablets twice daily for 1 week - doxycycline (VIBRA-TABS) 100 MG tablet; Take 1 tablet (100 mg total) by mouth 2 (two) times daily.  Dispense: 14 tablet; Refill: 0-to take with food -probiotic twice daily for 10 days.   Next appt: to follow up if symptoms fail to improve or worsen Jessica K. [place], [facility] [phone]

## 2018-06-28 ENCOUNTER — Telehealth: Payer: Self-pay | Admitting: *Deleted

## 2018-06-28 NOTE — Telephone Encounter (Signed)
Patient notified and agreed.  

## 2018-06-28 NOTE — Telephone Encounter (Signed)
Patient called and stated that she is a [facility] patient. Stated that she wanted to see Dr. Mariea Clonts yesterday but she was too full. Stated that Janett Billow gave her Mucinex to take and she read the small print on the package and she doesn't think she should be taking it because she is taking Citalopram. Stated she has not started taking it because it looks too threatening.  Please Advise.

## 2018-06-28 NOTE — Telephone Encounter (Signed)
I am not aware of an interaction between her mucinex and citalopram.  She may take both safely.

## 2018-07-04 ENCOUNTER — Telehealth: Payer: Self-pay | Admitting: *Deleted

## 2018-07-04 NOTE — Telephone Encounter (Signed)
Spoke with patient and advised results   

## 2018-07-04 NOTE — Telephone Encounter (Signed)
She can take probiotic twice daily while on antibiotic, would take for several days after then can stop

## 2018-07-04 NOTE — Telephone Encounter (Signed)
Pt asking if she should take a probiotic after taking the antibiotic that you gave her? Please advise

## 2018-07-05 DIAGNOSIS — D692 Other nonthrombocytopenic purpura: Secondary | ICD-10-CM | POA: Diagnosis not present

## 2018-07-05 DIAGNOSIS — C44629 Squamous cell carcinoma of skin of left upper limb, including shoulder: Secondary | ICD-10-CM | POA: Diagnosis not present

## 2018-07-05 DIAGNOSIS — L814 Other melanin hyperpigmentation: Secondary | ICD-10-CM | POA: Diagnosis not present

## 2018-07-05 DIAGNOSIS — D485 Neoplasm of uncertain behavior of skin: Secondary | ICD-10-CM | POA: Diagnosis not present

## 2018-07-19 ENCOUNTER — Telehealth: Payer: Self-pay

## 2018-07-19 DIAGNOSIS — I10 Essential (primary) hypertension: Secondary | ICD-10-CM

## 2018-07-19 MED ORDER — LOSARTAN POTASSIUM 50 MG PO TABS: 50.0000 mg | ORAL_TABLET | Freq: Every day | ORAL | 1 refills | Status: DC

## 2018-07-19 NOTE — Telephone Encounter (Signed)
Message left on clinical intake voicemail:   Patient would like a refill as soon as possible to [facility] on [address]. Patient did not provide name of medication in voicemail.   Spoke with patient, confirmed medication (Losartan). RX sent as requested to [facility]

## 2018-07-31 ENCOUNTER — Other Ambulatory Visit: Payer: Self-pay | Admitting: *Deleted

## 2018-07-31 DIAGNOSIS — I10 Essential (primary) hypertension: Secondary | ICD-10-CM

## 2018-07-31 NOTE — Telephone Encounter (Signed)
Patient called for Rx's to be sent to [facility]: [facility].   Pended Rx's and sent to Dr. Mariea Clonts for approval due to [place].

## 2018-08-01 MED ORDER — LOSARTAN POTASSIUM 50 MG PO TABS: 50.0000 mg | ORAL_TABLET | Freq: Every day | ORAL | 1 refills | Status: DC

## 2018-08-01 MED ORDER — CITALOPRAM HYDROBROMIDE 10 MG PO TABS: 10.0000 mg | ORAL_TABLET | Freq: Every day | ORAL | 1 refills | Status: DC

## 2018-08-07 ENCOUNTER — Encounter: Payer: Self-pay | Admitting: Internal Medicine

## 2018-08-12 ENCOUNTER — Other Ambulatory Visit: Payer: Self-pay | Admitting: *Deleted

## 2018-08-12 DIAGNOSIS — C44629 Squamous cell carcinoma of skin of left upper limb, including shoulder: Secondary | ICD-10-CM | POA: Diagnosis not present

## 2018-08-12 MED ORDER — CITALOPRAM HYDROBROMIDE 10 MG PO TABS: 10.0000 mg | ORAL_TABLET | Freq: Every day | ORAL | 0 refills | Status: DC

## 2018-08-20 DIAGNOSIS — I10 Essential (primary) hypertension: Secondary | ICD-10-CM | POA: Diagnosis not present

## 2018-08-20 DIAGNOSIS — E785 Hyperlipidemia, unspecified: Secondary | ICD-10-CM | POA: Diagnosis not present

## 2018-08-20 LAB — LIPID PANEL
Cholesterol: 238 — AB (ref 0–200)
HDL: 67 (ref 35–70)
LDL Cholesterol: 154
Triglycerides: 85 (ref 40–160)

## 2018-08-20 LAB — HEPATIC FUNCTION PANEL
ALT: 7 (ref 7–35)
AST: 13 (ref 13–35)
Alkaline Phosphatase: 53 (ref 25–125)
Bilirubin, Total: 0.7

## 2018-08-20 LAB — CBC AND DIFFERENTIAL
HCT: 38 (ref 36–46)
Hemoglobin: 12.8 (ref 12.0–16.0)
Platelets: 185 (ref 150–399)
WBC: 5.2

## 2018-08-20 LAB — BASIC METABOLIC PANEL
BUN: 25 — AB (ref 4–21)
Creatinine: 0.6 (ref 0.5–1.1)
Glucose: 100
Potassium: 3.9 (ref 3.4–5.3)
Sodium: 141 (ref 137–147)

## 2018-08-21 ENCOUNTER — Non-Acute Institutional Stay: Payer: Medicare HMO | Admitting: Internal Medicine

## 2018-08-21 ENCOUNTER — Encounter: Payer: Self-pay | Admitting: Internal Medicine

## 2018-08-21 VITALS — BP 148/80 | HR 75 | Temp 97.7°F | Ht 60.0 in | Wt 125.0 lb

## 2018-08-21 DIAGNOSIS — M1712 Unilateral primary osteoarthritis, left knee: Secondary | ICD-10-CM | POA: Diagnosis not present

## 2018-08-21 DIAGNOSIS — M810 Age-related osteoporosis without current pathological fracture: Secondary | ICD-10-CM | POA: Diagnosis not present

## 2018-08-21 DIAGNOSIS — G3184 Mild cognitive impairment, so stated: Secondary | ICD-10-CM | POA: Diagnosis not present

## 2018-08-21 DIAGNOSIS — I10 Essential (primary) hypertension: Secondary | ICD-10-CM

## 2018-08-21 DIAGNOSIS — F325 Major depressive disorder, single episode, in full remission: Secondary | ICD-10-CM

## 2018-08-21 DIAGNOSIS — R69 Illness, unspecified: Secondary | ICD-10-CM | POA: Diagnosis not present

## 2018-08-21 MED ORDER — VITAMIN D3 50 MCG (2000 UT) PO TABS: 1.0000 | ORAL_TABLET | Freq: Every day | ORAL | 3 refills | Status: DC

## 2018-08-21 NOTE — Progress Notes (Signed)
Location:  Occupational psychologist of Service:  Clinic (12)  Provider: Alexius Hangartner L. Mariea Clonts, D.O., C.M.D.  Code Status: DNR Goals of Care:  Advanced Directives 07/17/2017  Does Patient Have a Medical Advance Directive? Yes  Type of Paramedic of [place];Out of facility DNR (pink MOST or yellow form)  Does patient want to make changes to medical advance directive? No - Patient declined  Copy of [place] in Chart? Yes  Pre-existing out of facility DNR order (yellow form or pink MOST form) Yellow form placed in chart (order not valid for inpatient use)     Chief Complaint  Patient presents with  . Medical Management of Chronic Issues    follow-up    HPI: Patient is a 85 y.o. female seen today for medical management of chronic diseases.    She went on a trip, then imposed a 14 day quarantine on herself.  She had been to [place], [place] and [place] to see her children.  She went to [place] in independent living--she'd been on 8 airplanes and a bunch of crowded restaurants.    She had her big birthday Monday--she had a ton of phone calls from family.  Her neighborhood had a bday party.  6-7 garden homes wanted to do a bday dessert party at [facility] called it off, but Monday afternoon, they came with bday cards and she sat in her doorway and they all sang happy bday.    She has to order food for the next day before noon.    She has a couple of questions:  She takes no vitamins recently.  She has not even had her vitamin D3.    Asks if she needs to go back to Dr. Percival Spanish.  Doing fine on losartan therapy for bp.    Reviewed labs with glucose 100 instead of 80s as its been.  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    She is not on alendronate any longer.  She has a date for prolia.  Calcium levels good.  Needs to get back on her D3.  She took probiotics when she was on doxycycline.  She continued them afterwards.  She has to be real  careful about getting too loose or too constipated--had trouble when she was traveling, but back to normal.    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  Past Medical History:  Diagnosis Date  . Arthritis    "all over"  . BACK PAIN 03/08/2007  . 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 DEPRESSIVE DISORDER 12/01/2008  . Duodenal ulcer   . GI bleed 09/12/2010   Naproxen induced Endoscopy with dr Harlene Ramus   . HIATAL HERNIA 12/17/2006  . History of hiatal hernia   . HYPERLIPIDEMIA 03/09/2006  . HYPERTENSION 03/09/2006  . KNEE PAIN 05/06/2009  . Pneumonia    68yrs. ago- hosp. pneumonia  . Urinary urgency     Past Surgical History:  Procedure Laterality Date  . ABDOMINAL HYSTERECTOMY  1975  . APPLICATION OF A-CELL OF EXTREMITY Right 12/10/2014   Procedure: APPLICATION OF A-CELL OF EXTREMITY;  Surgeon: Theodoro Kos, DO;  Location: [facility];  Service: Plastics;  Laterality: Right;  . arthroscopic knee Right    x 2  . BACK SURGERY  2000   spinal fusion  . BILATERAL SALPINGECTOMY Bilateral 1989   Dr. Marianna Fuss  .  CARDIAC CATHETERIZATION    . EYE SURGERY     cataracts bilateral - removed  . HERNIA REPAIR Bilateral    inguinal   . I&D EXTREMITY Right 12/10/2014   Procedure: IRRIGATION AND DEBRIDEMENT ON RIGHT LEG, ;  Surgeon: Theodoro Kos, DO;  Location: [facility];  Service: Plastics;  Laterality: Right;  . I&D EXTREMITY Right 01/20/2015   Procedure: IRRIGATION AND DEBRIDEMENT RIGHT LOWER LEG WOUND, with ACELL to Donor Site, SKIN GRAFT AND VAC Placement;  Surgeon: Theodoro Kos, DO;  Location: [facility];  Service: Plastics;  Laterality: Right;  . LUMBAR LAMINECTOMY  02-17-1999   with sinal fusion L3,4,5,&S1  . moes     moses surgery on R lle  . OOPHORECTOMY  1989  . RESECTION DISTAL CLAVICAL    . ROTATOR CUFF REPAIR Right 2001   x2  . SKIN GRAFT Right 05/29/2018  . THYROIDECTOMY    . TONSILLECTOMY  1928  . TUBAL  LIGATION      Allergies  Allergen Reactions  . Naproxen     Gi bleed  . Nsaids     GI BLEED  . Aspirin     GI bleed  . Ace Inhibitors     Cough   . Caffeine Other (See Comments)    Causes joints to be painful    Outpatient Encounter Medications as of 08/21/2018  Medication Sig  . acetaminophen (TYLENOL) 650 MG CR tablet Take 1 tablet (650 mg total) by mouth as needed (for pain).  . citalopram (CELEXA) 10 MG tablet Take 1 tablet (10 mg total) by mouth daily.  Marland Kitchen losartan (COZAAR) 50 MG tablet Take 1 tablet (50 mg total) by mouth daily.  . Lutein 20 MG CAPS Take 20 mg by mouth daily.   . [DISCONTINUED] doxycycline (VIBRA-TABS) 100 MG tablet Take 1 tablet (100 mg total) by mouth 2 (two) times daily.   No facility-administered encounter medications on file as of 08/21/2018.     Review of Systems:  Review of Systems  Constitutional: Positive for malaise/fatigue. Negative for chills and fever.  HENT: Positive for hearing loss.   Eyes: Negative for blurred vision.       Glasses  Respiratory: Negative for cough and shortness of breath.   Cardiovascular: Negative for chest pain, palpitations and leg swelling.  Gastrointestinal: Positive for constipation and diarrhea. Negative for abdominal pain, blood in stool and melena.  Genitourinary: Negative for dysuria.  Musculoskeletal: Positive for joint pain. Negative for falls.  Skin: Negative for itching and rash.       Left 4th digit squamous cell  Neurological: Negative for dizziness and weakness.  Endo/Heme/Allergies: Bruises/bleeds easily.  Psychiatric/Behavioral: Positive for memory loss. Negative for depression. The patient is not nervous/anxious and does not have insomnia.     Health Maintenance  Topic Date Due  . TETANUS/TDAP  12/12/2022  . INFLUENZA VACCINE  Completed  . DEXA SCAN  Completed  . PNA vac Low Risk Adult  Completed    Physical Exam: Vitals:   08/21/18 0854  BP: (!) 148/80  Pulse: 75  Temp: 97.7 F (36.5  C)  TempSrc: Oral  SpO2: 97%  Weight: 125 lb (56.7 kg)  Height: 5' (1.524 m)   Body mass index is 24.41 kg/m. Physical Exam Vitals signs reviewed.  Constitutional:      Appearance: Normal appearance.  HENT:     Head: Normocephalic and atraumatic.  Eyes:     Comments: glasses  Cardiovascular:     Rate and Rhythm: Normal  rate and regular rhythm.     Pulses: Normal pulses.     Heart sounds: Normal heart sounds.  Pulmonary:     Effort: Pulmonary effort is normal.     Breath sounds: Normal breath sounds.  Abdominal:     General: Bowel sounds are normal.  Musculoskeletal: Normal range of motion.     Comments: Ambulates with rollator walker  Skin:    General: Skin is warm and dry.     Comments: Left finger remains mildly swollen where she had squamous cell ca removed, bandaid in place  Neurological:     General: No focal deficit present.     Mental Status: She is alert and oriented to person, place, and time. Mental status is at baseline.  Psychiatric:        Mood and Affect: Mood normal.        Behavior: Behavior normal.     Labs reviewed: Basic Metabolic Panel: No results for input(s): NA, K, CL, CO2, GLUCOSE, BUN, CREATININE, CALCIUM, MG, PHOS, TSH in the last 8760 hours. Liver Function Tests: No results for input(s): AST, ALT, ALKPHOS, BILITOT, PROT, ALBUMIN in the last 8760 hours. No results for input(s): LIPASE, AMYLASE in the last 8760 hours. No results for input(s): AMMONIA in the last 8760 hours. CBC: No results for input(s): WBC, NEUTROABS, HGB, HCT, MCV, PLT in the last 8760 hours. Lipid Panel: No results for input(s): CHOL, HDL, LDLCALC, TRIG, CHOLHDL, LDLDIRECT in the last 8760 hours. No results found for: HGBA1C  Procedures since last visit: No results found.  Assessment/Plan 1. Senile osteoporosis - ongoing, cont prolia, calcium level is normal -resume vitamin D therapy (pt ran out) - Cholecalciferol (VITAMIN D3) 50 MCG (2000 UT) TABS; Take 1 tablet  by mouth daily.  Dispense: 90 tablet; Refill: 3  2. Mild cognitive impairment with memory loss -remains mild, managing fine in [place] -grandson is assisting to some degree with med mgt from a distance--getting her meds refilled by mail order  3. Primary osteoarthritis of left knee -ongoing, has some cricks, once in a while uses tylenol  4. Essential hypertension -bp at goal with current therapy of losartan  5. Major depressive disorder with single episode, in full remission ([place]) -continue celexa--tolerating well  Labs/tests ordered:   Orders Placed This Encounter  Procedures  . CBC and differential    This external order was created through the Results Console.  . Basic metabolic panel    This external order was created through the Results Console.  . Lipid panel    This external order was created through the Results Console.  . Hepatic function panel    This external order was created through the Results Console.    Next appt:  09/24/2018  Saudia Smyser L. Savreen Gebhardt, D.O. [facility] [address],  [address] Cell Phone (Mon-Fri 8am-5pm):  [phone] On Call:  [phone] & follow prompts after 5pm & weekends Office Phone:  [phone] Office Fax:  [phone]

## 2018-09-24 ENCOUNTER — Ambulatory Visit: Payer: Medicare HMO

## 2018-09-25 ENCOUNTER — Other Ambulatory Visit: Payer: Self-pay

## 2018-09-25 ENCOUNTER — Ambulatory Visit: Payer: Medicare HMO | Admitting: *Deleted

## 2018-09-25 DIAGNOSIS — M810 Age-related osteoporosis without current pathological fracture: Secondary | ICD-10-CM

## 2018-09-25 MED ORDER — DENOSUMAB 60 MG/ML ~~LOC~~ SOSY
60.0000 mg | PREFILLED_SYRINGE | Freq: Once | SUBCUTANEOUS | Status: AC
  Administered 2018-09-25: 60 mg via SUBCUTANEOUS

## 2018-11-06 DIAGNOSIS — C44622 Squamous cell carcinoma of skin of right upper limb, including shoulder: Secondary | ICD-10-CM | POA: Diagnosis not present

## 2018-11-06 DIAGNOSIS — D485 Neoplasm of uncertain behavior of skin: Secondary | ICD-10-CM | POA: Diagnosis not present

## 2018-12-12 ENCOUNTER — Other Ambulatory Visit: Payer: Self-pay | Admitting: Internal Medicine

## 2018-12-25 ENCOUNTER — Non-Acute Institutional Stay: Payer: Medicare HMO | Admitting: Internal Medicine

## 2018-12-25 ENCOUNTER — Other Ambulatory Visit: Payer: Self-pay

## 2018-12-25 ENCOUNTER — Encounter: Payer: Self-pay | Admitting: Internal Medicine

## 2018-12-25 VITALS — BP 112/62 | HR 88 | Temp 98.6°F | Ht 60.0 in | Wt 127.0 lb

## 2018-12-25 DIAGNOSIS — M810 Age-related osteoporosis without current pathological fracture: Secondary | ICD-10-CM

## 2018-12-25 DIAGNOSIS — M7062 Trochanteric bursitis, left hip: Secondary | ICD-10-CM | POA: Diagnosis not present

## 2018-12-25 DIAGNOSIS — G3184 Mild cognitive impairment, so stated: Secondary | ICD-10-CM

## 2018-12-25 DIAGNOSIS — R69 Illness, unspecified: Secondary | ICD-10-CM | POA: Diagnosis not present

## 2018-12-25 DIAGNOSIS — F325 Major depressive disorder, single episode, in full remission: Secondary | ICD-10-CM

## 2018-12-25 NOTE — Progress Notes (Signed)
Location:  Occupational psychologist of Service:  Clinic (12)  Provider: Deep Bonawitz L. Mariea Clonts, D.O., C.M.D.  Code Status: DNR Goals of Care:  Advanced Directives 07/17/2017  Does Patient Have a Medical Advance Directive? Yes  Type of Paramedic of [place];Out of facility DNR (pink MOST or yellow form)  Does patient want to make changes to medical advance directive? No - Patient declined  Copy of [place] in Chart? Yes  Pre-existing out of facility DNR order (yellow form or pink MOST form) Yellow form placed in chart (order not valid for inpatient use)     Chief Complaint  Patient presents with  . Medical Management of Chronic Issues    42mth follow-up    HPI: Patient is a 85 y.o. female seen today for medical management of chronic diseases.    She is out of vitamin D3 and asks me about the dosage she needs again.  She wonders about prolia for her--says it's a big expense.  Even after her insurance, it's expensive.  She c/o soreness on her left hip.  She cannot sleep on it.  In the morning, her back hurts a little and the place in her hip.  She takes a single tylenol in the morning and doesn't need anymore after that.    She's had two squamous cells removed on a finger and another from right upper arm by Dr. Bonne Dolores at [facility].  Past Medical History:  Diagnosis Date  . Arthritis    "all over"  . BACK PAIN 03/08/2007  . 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 DEPRESSIVE DISORDER 12/01/2008  . Duodenal ulcer   . GI bleed 09/12/2010   Naproxen induced Endoscopy with dr Harlene Ramus   . HIATAL HERNIA 12/17/2006  . History of hiatal hernia   . HYPERLIPIDEMIA 03/09/2006  . HYPERTENSION 03/09/2006  . KNEE PAIN 05/06/2009  . Pneumonia    66yrs. ago- hosp. pneumonia  . Urinary urgency     Past Surgical History:  Procedure Laterality Date  . ABDOMINAL HYSTERECTOMY  1975  . APPLICATION OF  A-CELL OF EXTREMITY Right 12/10/2014   Procedure: APPLICATION OF A-CELL OF EXTREMITY;  Surgeon: Theodoro Kos, DO;  Location: [facility];  Service: Plastics;  Laterality: Right;  . arthroscopic knee Right    x 2  . BACK SURGERY  2000   spinal fusion  . BILATERAL SALPINGECTOMY Bilateral 1989   Dr. Marianna Fuss  . CARDIAC CATHETERIZATION    . EYE SURGERY     cataracts bilateral - removed  . HERNIA REPAIR Bilateral    inguinal   . I&D EXTREMITY Right 12/10/2014   Procedure: IRRIGATION AND DEBRIDEMENT ON RIGHT LEG, ;  Surgeon: Theodoro Kos, DO;  Location: [facility];  Service: Plastics;  Laterality: Right;  . I&D EXTREMITY Right 01/20/2015   Procedure: IRRIGATION AND DEBRIDEMENT RIGHT LOWER LEG WOUND, with ACELL to Donor Site, SKIN GRAFT AND VAC Placement;  Surgeon: Theodoro Kos, DO;  Location: [facility];  Service: Plastics;  Laterality: Right;  . LUMBAR LAMINECTOMY  02-17-1999   with sinal fusion L3,4,5,&S1  . moes     moses surgery on R lle  . OOPHORECTOMY  1989  . RESECTION DISTAL CLAVICAL    . ROTATOR CUFF REPAIR Right 2001   x2  . SKIN GRAFT Right 05/29/2018  . THYROIDECTOMY    . TONSILLECTOMY  1928  . TUBAL LIGATION  Allergies  Allergen Reactions  . Naproxen     Gi bleed  . Nsaids     GI BLEED  . Aspirin     GI bleed  . Ace Inhibitors     Cough   . Caffeine Other (See Comments)    Causes joints to be painful    Outpatient Encounter Medications as of 12/25/2018  Medication Sig  . acetaminophen (TYLENOL) 650 MG CR tablet Take 1 tablet (650 mg total) by mouth as needed (for pain).  . Cholecalciferol (VITAMIN D3) 50 MCG (2000 UT) TABS Take 1 tablet by mouth daily.  . citalopram (CELEXA) 10 MG tablet TAKE 1 TABLET DAILY  . losartan (COZAAR) 50 MG tablet Take 1 tablet (50 mg total) by mouth daily.  . Lutein 20 MG CAPS Take 20 mg by mouth daily.    No facility-administered encounter medications on file as of 12/25/2018.     Review of Systems:  Review of Systems  Constitutional:  Negative for chills, fever and malaise/fatigue.  HENT: Positive for hearing loss. Negative for congestion and sore throat.   Eyes: Negative for blurred vision.  Respiratory: Negative for cough and shortness of breath.   Cardiovascular: Negative for chest pain, palpitations and leg swelling.  Gastrointestinal: Negative for abdominal pain, blood in stool, constipation, diarrhea and melena.  Genitourinary: Negative for dysuria.  Musculoskeletal: Positive for back pain and joint pain. Negative for falls.  Skin: Negative for itching and rash.  Neurological: Negative for dizziness and loss of consciousness.  Endo/Heme/Allergies: Bruises/bleeds easily.  Psychiatric/Behavioral: Positive for memory loss. Negative for depression. The patient is not nervous/anxious and does not have insomnia.     Health Maintenance  Topic Date Due  . INFLUENZA VACCINE  12/28/2018  . TETANUS/TDAP  12/12/2022  . DEXA SCAN  Completed  . PNA vac Low Risk Adult  Completed    Physical Exam: Vitals:   12/25/18 1349  BP: 112/62  Pulse: 88  Temp: 98.6 F (37 C)  TempSrc: Oral  SpO2: 96%  Weight: 127 lb (57.6 kg)  Height: 5' (1.524 m)   Body mass index is 24.8 kg/m. Physical Exam Vitals signs reviewed.  Constitutional:      General: She is not in acute distress.    Appearance: Normal appearance. She is normal weight. She is not ill-appearing or toxic-appearing.  HENT:     Head: Normocephalic and atraumatic.     Ears:     Comments: HOH Eyes:     Comments: glasses  Cardiovascular:     Rate and Rhythm: Normal rate and regular rhythm.     Pulses: Normal pulses.     Heart sounds: Normal heart sounds.  Pulmonary:     Effort: Pulmonary effort is normal.     Breath sounds: Normal breath sounds. No wheezing, rhonchi or rales.  Abdominal:     General: Bowel sounds are normal.     Palpations: Abdomen is soft.  Musculoskeletal: Normal range of motion.     Right lower leg: No edema.     Left lower leg: No  edema.     Comments: Ambulates with rollator walker  Skin:    General: Skin is warm and dry.  Neurological:     General: No focal deficit present.     Mental Status: She is alert and oriented to person, place, and time. Mental status is at baseline.     Comments: Some short term memory loss notable  Psychiatric:        Mood  and Affect: Mood normal.        Behavior: Behavior normal.     Labs reviewed: Basic Metabolic Panel: Recent Labs    08/20/18 0400  NA 141  K 3.9  BUN 25*  CREATININE 0.6   Liver Function Tests: Recent Labs    08/20/18 0400  AST 13  ALT 7  ALKPHOS 53   No results for input(s): LIPASE, AMYLASE in the last 8760 hours. No results for input(s): AMMONIA in the last 8760 hours. CBC: Recent Labs    08/20/18 0400  WBC 5.2  HGB 12.8  HCT 38  PLT 185   Lipid Panel: Recent Labs    08/20/18 0400  CHOL 238*  HDL 67  LDLCALC 154  TRIG 85   Assessment/Plan 1. Senile osteoporosis -cont prolia, resume vitamin D3 2000 units daily naturemade brand and take daily We will check bone density after 04/14/19 to see how prolia has done the past two years--she is having to pay a portion of it and doesn't want to if it's not working  2. Trochanteric bursitis of left hip -cont tylenol, may use heat, ice, topicals; avoid overdoing exercise program to flare up  3. Mild cognitive impairment with memory loss -remains, but has not progressed much based on visits; will see how AWV goes for mmse  4. Major depressive disorder with single episode, in full remission ([place]) -wants to stop celexa -Take celexa every other day for a week, then stop  Labs/tests ordered:  No new Next appt:  04/02/2019  Lavell Ridings L. Wilmore Holsomback, D.O. [facility] [address], [place] [address] Cell Phone (Mon-Fri 8am-5pm):  [phone] On Call:  [phone] & follow prompts after 5pm & weekends Office Phone:  [phone] Office Fax:   [phone]

## 2018-12-27 DIAGNOSIS — M810 Age-related osteoporosis without current pathological fracture: Secondary | ICD-10-CM | POA: Insufficient documentation

## 2019-01-13 DIAGNOSIS — R69 Illness, unspecified: Secondary | ICD-10-CM | POA: Diagnosis not present

## 2019-02-19 DIAGNOSIS — H52203 Unspecified astigmatism, bilateral: Secondary | ICD-10-CM | POA: Diagnosis not present

## 2019-02-19 DIAGNOSIS — D3132 Benign neoplasm of left choroid: Secondary | ICD-10-CM | POA: Diagnosis not present

## 2019-02-19 DIAGNOSIS — Z961 Presence of intraocular lens: Secondary | ICD-10-CM | POA: Diagnosis not present

## 2019-03-04 ENCOUNTER — Other Ambulatory Visit: Payer: Self-pay | Admitting: Internal Medicine

## 2019-03-04 DIAGNOSIS — I10 Essential (primary) hypertension: Secondary | ICD-10-CM

## 2019-03-05 NOTE — Telephone Encounter (Signed)
Routing to provider for approval. High allergy/contraindication warning indicated.

## 2019-03-21 DIAGNOSIS — L820 Inflamed seborrheic keratosis: Secondary | ICD-10-CM | POA: Diagnosis not present

## 2019-03-21 DIAGNOSIS — D692 Other nonthrombocytopenic purpura: Secondary | ICD-10-CM | POA: Diagnosis not present

## 2019-03-21 DIAGNOSIS — L821 Other seborrheic keratosis: Secondary | ICD-10-CM | POA: Diagnosis not present

## 2019-03-21 DIAGNOSIS — D1801 Hemangioma of skin and subcutaneous tissue: Secondary | ICD-10-CM | POA: Diagnosis not present

## 2019-03-21 DIAGNOSIS — D225 Melanocytic nevi of trunk: Secondary | ICD-10-CM | POA: Diagnosis not present

## 2019-03-21 DIAGNOSIS — Z85828 Personal history of other malignant neoplasm of skin: Secondary | ICD-10-CM | POA: Diagnosis not present

## 2019-03-21 DIAGNOSIS — L570 Actinic keratosis: Secondary | ICD-10-CM | POA: Diagnosis not present

## 2019-03-21 DIAGNOSIS — L814 Other melanin hyperpigmentation: Secondary | ICD-10-CM | POA: Diagnosis not present

## 2019-04-02 ENCOUNTER — Ambulatory Visit: Payer: Medicare HMO

## 2019-04-02 ENCOUNTER — Other Ambulatory Visit: Payer: Self-pay

## 2019-04-02 DIAGNOSIS — M810 Age-related osteoporosis without current pathological fracture: Secondary | ICD-10-CM

## 2019-04-02 MED ORDER — DENOSUMAB 60 MG/ML ~~LOC~~ SOSY
60.0000 mg | PREFILLED_SYRINGE | Freq: Once | SUBCUTANEOUS | Status: AC
  Administered 2019-04-02: 60 mg via SUBCUTANEOUS

## 2019-04-03 ENCOUNTER — Other Ambulatory Visit: Payer: Self-pay

## 2019-04-03 ENCOUNTER — Encounter: Payer: Self-pay | Admitting: Nurse Practitioner

## 2019-04-03 ENCOUNTER — Ambulatory Visit (INDEPENDENT_AMBULATORY_CARE_PROVIDER_SITE_OTHER): Payer: Medicare HMO | Admitting: Nurse Practitioner

## 2019-04-03 VITALS — Ht 60.0 in | Wt 127.0 lb

## 2019-04-03 DIAGNOSIS — Z0000 Encounter for general adult medical examination without abnormal findings: Secondary | ICD-10-CM

## 2019-04-03 DIAGNOSIS — E2839 Other primary ovarian failure: Secondary | ICD-10-CM | POA: Diagnosis not present

## 2019-04-03 NOTE — Progress Notes (Signed)
Subjective:   Vanessa Mason is a 85 y.o. female who presents for Medicare Annual (Subsequent) preventive examination.  Review of Systems:   Cardiac Risk Factors include: advanced age (>14men, >37 women);hypertension     Objective:     Vitals: Ht 5' (1.524 m)   Wt 127 lb (57.6 kg)   BMI 24.80 kg/m   Body mass index is 24.8 kg/m.  Advanced Directives 04/03/2019 04/03/2019 04/03/2019 07/17/2017 12/06/2016 07/17/2016 07/17/2016  Does Patient Have a Medical Advance Directive? Yes Yes Yes Yes Yes Yes Yes  Type of Paramedic of [place];Out of facility DNR (pink MOST or yellow form);Living will [place];Out of facility DNR (pink MOST or yellow form);Living will [place];Living will;Out of facility DNR (pink MOST or yellow form) [place];Out of facility DNR (pink MOST or yellow form) [place];Out of facility DNR (pink MOST or yellow form) [place];Out of facility DNR (pink MOST or yellow form);Living will [place];Out of facility DNR (pink MOST or yellow form);Living will  Does patient want to make changes to medical advance directive? No - Patient declined No - Patient declined No - Patient declined No - Patient declined - - -  Copy of [place] in Chart? Yes - validated most recent copy scanned in chart (See row information) Yes - validated most recent copy scanned in chart (See row information) Yes - validated most recent copy scanned in chart (See row information) Yes Yes Yes -  Pre-existing out of facility DNR order (yellow form or pink MOST form) - - Yellow form placed in chart (order not valid for inpatient use) Yellow form placed in chart (order not valid for inpatient use) Yellow form placed in chart (order not valid for inpatient use) Yellow form placed in chart (order not valid for inpatient use) -    Tobacco Social History    Tobacco Use  Smoking Status Former Smoker  . Years: 16.00  Smokeless Tobacco Never Used  Tobacco Comment   Quit 16 years     Counseling given: Not Answered Comment: Quit 16 years   Clinical Intake:  Pre-visit preparation completed: Yes  Pain : (chronic pain to knees and lower back, stable and not having significant pain at this time)     BMI - recorded: 24.8 Nutritional Status: BMI of 19-24  Normal Nutritional Risks: None Diabetes: No  How often do you need to have someone help you when you read instructions, pamphlets, or other written materials from your doctor or pharmacy?: 1 - Never What is the last grade level you completed in school?: graduate school  Interpreter Needed?: No     Past Medical History:  Diagnosis Date  . Arthritis    "all over"  . BACK PAIN 03/08/2007  . 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 DEPRESSIVE DISORDER 12/01/2008  . Duodenal ulcer   . GI bleed 09/12/2010   Naproxen induced Endoscopy with dr Harlene Ramus   . HIATAL HERNIA 12/17/2006  . History of hiatal hernia   . HYPERLIPIDEMIA 03/09/2006  . HYPERTENSION 03/09/2006  . KNEE PAIN 05/06/2009  . Pneumonia    9yrs. ago- hosp. pneumonia  . Urinary urgency    Past Surgical History:  Procedure Laterality Date  . ABDOMINAL HYSTERECTOMY  1975  . APPLICATION OF A-CELL OF EXTREMITY Right 12/10/2014   Procedure: APPLICATION OF A-CELL OF EXTREMITY;  Surgeon: Theodoro Kos, DO;  Location: [facility];  Service: Plastics;  Laterality: Right;  . arthroscopic knee Right    x 2  . BACK SURGERY  2000   spinal fusion  . BILATERAL SALPINGECTOMY Bilateral 1989   Dr. Marianna Fuss  . CARDIAC CATHETERIZATION    . EYE SURGERY     cataracts bilateral - removed  . HERNIA REPAIR Bilateral    inguinal   . I&D EXTREMITY Right 12/10/2014   Procedure: IRRIGATION AND DEBRIDEMENT ON RIGHT LEG, ;  Surgeon: Theodoro Kos, DO;  Location: [facility];  Service: Plastics;  Laterality: Right;  . I&D EXTREMITY  Right 01/20/2015   Procedure: IRRIGATION AND DEBRIDEMENT RIGHT LOWER LEG WOUND, with ACELL to Donor Site, SKIN GRAFT AND VAC Placement;  Surgeon: Theodoro Kos, DO;  Location: [facility];  Service: Plastics;  Laterality: Right;  . LUMBAR LAMINECTOMY  02-17-1999   with sinal fusion L3,4,5,&S1  . moes     moses surgery on R lle  . OOPHORECTOMY  1989  . RESECTION DISTAL CLAVICAL    . ROTATOR CUFF REPAIR Right 2001   x2  . SKIN GRAFT Right 05/29/2018  . THYROIDECTOMY    . TONSILLECTOMY  1928  . TUBAL LIGATION     Family History  Problem Relation Age of Onset  . Cancer Mother 6  . Cancer Brother    Social History   Socioeconomic History  . Marital status: Widowed    Spouse name: Not on file  . Number of children: 5  . Years of education: Not on file  . Highest education level: Not on file  Occupational History  . Not on file  Social Needs  . Financial resource strain: Not hard at all  . Food insecurity    Worry: Never true    Inability: Never true  . Transportation needs    Medical: No    Non-medical: No  Tobacco Use  . Smoking status: Former Smoker    Years: 16.00  . Smokeless tobacco: Never Used  . Tobacco comment: Quit 16 years  Substance and Sexual Activity  . Alcohol use: Yes    Alcohol/week: 7.0 standard drinks    Types: 7 Glasses of wine per week    Comment: wine occasionally @ dinner  . Drug use: No  . Sexual activity: Not on file  Lifestyle  . Physical activity    Days per week: 5 days    Minutes per session: 20 min  . Stress: Only a little  Relationships  . Social connections    Talks on phone: More than three times a week    Gets together: More than three times a week    Attends religious service: Never    Active member of club or organization: Yes    Attends meetings of clubs or organizations: More than 4 times per year    Relationship status: Widowed  Other Topics Concern  . Not on file  Social History Narrative   Lives at [facility] since 09/1992    Widow   Never smoked   Alcohol wine at night   Exercise classes 3-5 times a week    POA, DNR, Living Will    Outpatient Encounter Medications as of 04/03/2019  Medication Sig  . acetaminophen (TYLENOL) 650 MG CR tablet Take 1 tablet (650 mg total) by mouth as needed (for pain).  . Cholecalciferol (VITAMIN D3) 50 MCG (2000 UT) TABS Take 1 tablet by mouth daily.  Marland Kitchen denosumab (PROLIA) 60 MG/ML SOSY injection  Inject 60 mg into the skin every 6 (six) months.  Marland Kitchen losartan (COZAAR) 50 MG tablet TAKE 1 TABLET DAILY  . Lutein 20 MG CAPS Take 20 mg by mouth daily.    No facility-administered encounter medications on file as of 04/03/2019.     Activities of Daily Living In your present state of health, do you have any difficulty performing the following activities: 04/03/2019  Hearing? Y  Vision? N  Difficulty concentrating or making decisions? Y  Walking or climbing stairs? Y  Dressing or bathing? N  Doing errands, shopping? Y  Comment does not Physiological scientist and eating ? N  Using the Toilet? N  In the past six months, have you accidently leaked urine? Y  Do you have problems with loss of bowel control? Y  Managing your Medications? N  Managing your Finances? N  Housekeeping or managing your Housekeeping? N  Some recent data might be hidden    Patient Care Team: Gayland Curry, DO as PCP - General (Geriatric Medicine) Ronald Lobo, MD as Consulting Physician (Gastroenterology) Vickey Huger, MD as Consulting Physician (Orthopedic Surgery) Izora Ribas as Consulting Physician (Dermatology) Darlin Coco, MD as Consulting Physician (Cardiology) Luberta Mutter, MD as Consulting Physician (Ophthalmology)    Assessment:   This is a routine wellness examination for [place].  Exercise Activities and Dietary recommendations Current Exercise Habits: The patient does not participate in regular exercise at present;Home exercise routine, Type of exercise: walking, Time  (Minutes): 20, Frequency (Times/Week): 7, Weekly Exercise (Minutes/Week): 140, Intensity: Moderate, Exercise limited by: orthopedic condition(s)  Goals    . Patient Stated     Maintain current level of health       Fall Risk Fall Risk  04/03/2019 12/25/2018 08/21/2018 06/27/2018 04/03/2018  Falls in the past year? 0 0 0 0 0  Number falls in past yr: - 0 0 0 0  Injury with Fall? - 0 0 0 0  Comment - - - - -   Is the patient's home free of loose throw rugs in walkways, pet beds, electrical cords, etc?   yes      Grab bars in the bathroom? yes      Handrails on the stairs?   No stairs in home      Adequate lighting?   yes  Timed Get Up and Go performed: na  Depression Screen PHQ 2/9 Scores 04/03/2019 12/25/2018 08/21/2018 04/03/2018  PHQ - 2 Score 0 0 0 0     Cognitive Function MMSE - Mini Mental State Exam 07/17/2017 06/07/2016  Orientation to time 5 5  Orientation to Place 5 5  Registration 3 3  Attention/ Calculation 5 5  Recall 2 1  Language- name 2 objects 2 2  Language- repeat 1 1  Language- follow 3 step command 3 3  Language- read & follow direction 1 1  Write a sentence 1 1  Copy design 1 1  Total score 29 28     6CIT Screen 04/03/2019  What Year? 0 points  What month? 0 points  What time? 0 points  Count back from 20 0 points  Months in reverse 0 points  Repeat phrase 2 points  Total Score 2    Immunization History  Administered Date(s) Administered  . Influenza Split 03/08/2011, 03/05/2012  . Influenza Whole 03/08/2007, 03/02/2009, 03/29/2010  . Influenza, High Dose Seasonal PF 03/21/2013, 06/13/2017, 03/07/2019  . Influenza,inj,Quad PF,6+ Mos 02/20/2014, 02/11/2015, 03/22/2018  . Influenza-Unspecified 03/23/2016  . Pneumococcal Conjugate-13  07/30/2014  . Pneumococcal Polysaccharide-23 02/03/2002, 12/16/2009  . Td 02/03/2002  . Tdap 12/11/2012    Qualifies for Shingles Vaccine?yes  Screening Tests Health Maintenance  Topic Date Due  . TETANUS/TDAP   12/12/2022  . INFLUENZA VACCINE  Completed  . DEXA SCAN  Completed  . PNA vac Low Risk Adult  Completed    Cancer Screenings: Lung: Low Dose CT Chest recommended if Age 88-80 years, 30 pack-year currently smoking OR have quit w/in 15years. Patient does not qualify. Breast:  Up to date on Mammogram? Aged out   Up to date of Bone Density/Dexa? Yes Colorectal: aged out  Additional Screenings:  Hepatitis C Screening: na     Plan:      I have personally reviewed and noted the following in the patient's chart:   . Medical and social history . Use of alcohol, tobacco or illicit drugs  . Current medications and supplements . Functional ability and status . Nutritional status . Physical activity . Advanced directives . List of other physicians . Hospitalizations, surgeries, and ER visits in previous 12 months . Vitals . Screenings to include cognitive, depression, and falls . Referrals and appointments  In addition, I have reviewed and discussed with patient certain preventive protocols, quality metrics, and best practice recommendations. A written personalized care plan for preventive services as well as general preventive health recommendations were provided to patient.     Lauree Chandler, NP  04/03/2019

## 2019-04-03 NOTE — Progress Notes (Signed)
This service is provided via telemedicine  No vital signs collected/recorded due to the encounter was a telemedicine visit.   Location of patient (ex: home, work):  Home.  Patient consents to a telephone visit:  Yes  Location of the provider (ex: office, home):  [facility]  Name of any referring provider:  Hollace Kinnier, DO  Names of all persons participating in the telemedicine service and their role in the encounter:  Bonney Leitz, [place]; Sherrie Mustache, NP; patient.   Time spent on call:  3.43 minutes  CMA time only

## 2019-04-03 NOTE — Patient Instructions (Signed)
Vanessa Mason , Thank you for taking time to come for your Medicare Wellness Visit. I appreciate your ongoing commitment to your health goals. Please review the following plan we discussed and let me know if I can assist you in the future.   Screening recommendations/referrals: Colonoscopy aged out Mammogram aged out Bone Density DUE this month Recommended yearly ophthalmology/optometry visit for glaucoma screening and checkup Recommended yearly dental visit for hygiene and checkup  Vaccinations: Influenza vaccine up to date Pneumococcal vaccine up to date Tdap vaccine up to date Shingles vaccine  Recommended to get at local pharmacy    Advanced directives: on file.   Conditions/risks identified: none.  Next appointment: 1 year   Preventive Care 28 Years and Older, Female Preventive care refers to lifestyle choices and visits with your health care provider that can promote health and wellness. What does preventive care include?  A yearly physical exam. This is also called an annual well check.  Dental exams once or twice a year.  Routine eye exams. Ask your health care provider how often you should have your eyes checked.  Personal lifestyle choices, including:  Daily care of your teeth and gums.  Regular physical activity.  Eating a healthy diet.  Avoiding tobacco and drug use.  Limiting alcohol use.  Practicing safe sex.  Taking low-dose aspirin every day.  Taking vitamin and mineral supplements as recommended by your health care provider. What happens during an annual well check? The services and screenings done by your health care provider during your annual well check will depend on your age, overall health, lifestyle risk factors, and family history of disease. Counseling  Your health care provider may ask you questions about your:  Alcohol use.  Tobacco use.  Drug use.  Emotional well-being.  Home and relationship well-being.  Sexual  activity.  Eating habits.  History of falls.  Memory and ability to understand (cognition).  Work and work Statistician.  Reproductive health. Screening  You may have the following tests or measurements:  Height, weight, and BMI.  Blood pressure.  Lipid and cholesterol levels. These may be checked every 5 years, or more frequently if you are over 64 years old.  Skin check.  Lung cancer screening. You may have this screening every year starting at age 32 if you have a 30-pack-year history of smoking and currently smoke or have quit within the past 15 years.  Fecal occult blood test (FOBT) of the stool. You may have this test every year starting at age 2.  Flexible sigmoidoscopy or colonoscopy. You may have a sigmoidoscopy every 5 years or a colonoscopy every 10 years starting at age 22.  Hepatitis C blood test.  Hepatitis B blood test.  Sexually transmitted disease (STD) testing.  Diabetes screening. This is done by checking your blood sugar (glucose) after you have not eaten for a while (fasting). You may have this done every 1-3 years.  Bone density scan. This is done to screen for osteoporosis. You may have this done starting at age 52.  Mammogram. This may be done every 1-2 years. Talk to your health care provider about how often you should have regular mammograms. Talk with your health care provider about your test results, treatment options, and if necessary, the need for more tests. Vaccines  Your health care provider may recommend certain vaccines, such as:  Influenza vaccine. This is recommended every year.  Tetanus, diphtheria, and acellular pertussis (Tdap, Td) vaccine. You may need a Td booster every  10 years.  Zoster vaccine. You may need this after age 78.  Pneumococcal 13-valent conjugate (PCV13) vaccine. One dose is recommended after age 65.  Pneumococcal polysaccharide (PPSV23) vaccine. One dose is recommended after age 30. Talk to your health care  provider about which screenings and vaccines you need and how often you need them. This information is not intended to replace advice given to you by your health care provider. Make sure you discuss any questions you have with your health care provider. Document Released: 06/11/2015 Document Revised: 02/02/2016 Document Reviewed: 03/16/2015 Elsevier Interactive Patient Education  2017 [place] Prevention in the Home Falls can cause injuries. They can happen to people of all ages. There are many things you can do to make your home safe and to help prevent falls. What can I do on the outside of my home?  Regularly fix the edges of walkways and driveways and fix any cracks.  Remove anything that might make you trip as you walk through a door, such as a raised step or threshold.  Trim any bushes or trees on the path to your home.  Use bright outdoor lighting.  Clear any walking paths of anything that might make someone trip, such as rocks or tools.  Regularly check to see if handrails are loose or broken. Make sure that both sides of any steps have handrails.  Any raised decks and porches should have guardrails on the edges.  Have any leaves, snow, or ice cleared regularly.  Use sand or salt on walking paths during winter.  Clean up any spills in your garage right away. This includes oil or grease spills. What can I do in the bathroom?  Use night lights.  Install grab bars by the toilet and in the tub and shower. Do not use towel bars as grab bars.  Use non-skid mats or decals in the tub or shower.  If you need to sit down in the shower, use a plastic, non-slip stool.  Keep the floor dry. Clean up any water that spills on the floor as soon as it happens.  Remove soap buildup in the tub or shower regularly.  Attach bath mats securely with double-sided non-slip rug tape.  Do not have throw rugs and other things on the floor that can make you trip. What can I do in  the bedroom?  Use night lights.  Make sure that you have a light by your bed that is easy to reach.  Do not use any sheets or blankets that are too big for your bed. They should not hang down onto the floor.  Have a firm chair that has side arms. You can use this for support while you get dressed.  Do not have throw rugs and other things on the floor that can make you trip. What can I do in the kitchen?  Clean up any spills right away.  Avoid walking on wet floors.  Keep items that you use a lot in easy-to-reach places.  If you need to reach something above you, use a strong step stool that has a grab bar.  Keep electrical cords out of the way.  Do not use floor polish or wax that makes floors slippery. If you must use wax, use non-skid floor wax.  Do not have throw rugs and other things on the floor that can make you trip. What can I do with my stairs?  Do not leave any items on the stairs.  Make sure  that there are handrails on both sides of the stairs and use them. Fix handrails that are broken or loose. Make sure that handrails are as long as the stairways.  Check any carpeting to make sure that it is firmly attached to the stairs. Fix any carpet that is loose or worn.  Avoid having throw rugs at the top or bottom of the stairs. If you do have throw rugs, attach them to the floor with carpet tape.  Make sure that you have a light switch at the top of the stairs and the bottom of the stairs. If you do not have them, ask someone to add them for you. What else can I do to help prevent falls?  Wear shoes that:  Do not have high heels.  Have rubber bottoms.  Are comfortable and fit you well.  Are closed at the toe. Do not wear sandals.  If you use a stepladder:  Make sure that it is fully opened. Do not climb a closed stepladder.  Make sure that both sides of the stepladder are locked into place.  Ask someone to hold it for you, if possible.  Clearly mark and  make sure that you can see:  Any grab bars or handrails.  First and last steps.  Where the edge of each step is.  Use tools that help you move around (mobility aids) if they are needed. These include:  Canes.  Walkers.  Scooters.  Crutches.  Turn on the lights when you go into a dark area. Replace any light bulbs as soon as they burn out.  Set up your furniture so you have a clear path. Avoid moving your furniture around.  If any of your floors are uneven, fix them.  If there are any pets around you, be aware of where they are.  Review your medicines with your doctor. Some medicines can make you feel dizzy. This can increase your chance of falling. Ask your doctor what other things that you can do to help prevent falls. This information is not intended to replace advice given to you by your health care provider. Make sure you discuss any questions you have with your health care provider. Document Released: 03/11/2009 Document Revised: 10/21/2015 Document Reviewed: 06/19/2014 Elsevier Interactive Patient Education  2017 [employer].

## 2019-04-09 ENCOUNTER — Non-Acute Institutional Stay: Payer: Medicare HMO | Admitting: Internal Medicine

## 2019-04-09 ENCOUNTER — Encounter: Payer: Self-pay | Admitting: Internal Medicine

## 2019-04-09 ENCOUNTER — Other Ambulatory Visit: Payer: Self-pay

## 2019-04-09 VITALS — BP 132/86 | HR 74 | Temp 97.7°F | Ht 60.0 in | Wt 130.6 lb

## 2019-04-09 DIAGNOSIS — R4789 Other speech disturbances: Secondary | ICD-10-CM | POA: Insufficient documentation

## 2019-04-09 DIAGNOSIS — G3184 Mild cognitive impairment, so stated: Secondary | ICD-10-CM | POA: Diagnosis not present

## 2019-04-09 DIAGNOSIS — K921 Melena: Secondary | ICD-10-CM | POA: Diagnosis not present

## 2019-04-09 DIAGNOSIS — K5909 Other constipation: Secondary | ICD-10-CM | POA: Diagnosis not present

## 2019-04-09 DIAGNOSIS — I10 Essential (primary) hypertension: Secondary | ICD-10-CM

## 2019-04-09 DIAGNOSIS — M810 Age-related osteoporosis without current pathological fracture: Secondary | ICD-10-CM | POA: Diagnosis not present

## 2019-04-09 DIAGNOSIS — M7062 Trochanteric bursitis, left hip: Secondary | ICD-10-CM

## 2019-04-09 NOTE — Progress Notes (Signed)
Location:  Occupational psychologist of Service:  Clinic (12)  Provider: Keliyah Spillman L. Mariea Clonts, D.O., C.M.D.  Code Status: DNR Goals of Care:  Advanced Directives 04/03/2019  Does Patient Have a Medical Advance Directive? Yes  Type of Paramedic of [place];Out of facility DNR (pink MOST or yellow form);Living will  Does patient want to make changes to medical advance directive? No - Patient declined  Copy of [place] in Chart? Yes - validated most recent copy scanned in chart (See row information)  Pre-existing out of facility DNR order (yellow form or pink MOST form) -     Chief Complaint  Patient presents with  . Medical Management of Chronic Issues    4 Month Follow up    HPI: Patient is a 85 y.o. female seen today for medical management of chronic diseases.    She is upset that she weighs 130 lbs.  Has been eating too many desserts.    She says that much of the time, her stools are terrible looking.  They are very dark.  She thinks it may be when she eats dark greens.  She does try to eat them.  She had two episodes in years passed where she's had blood in her bowels so she is sensitive to this.  She wants to test her stool when it's dark the next time to make sure.  She asked me what bursitis is today.  Her left hip is giving her a lot of problems.  She got some of the new voltaren gel that's otc.  She will try this.  She asked when her CT brain was.  We reviewed the findings again from 03/04/15.  She wants to know what she can do to slow down her word-finding.   Recommended mental exercises.   Did discuss hydration again.   She does not get thirsty.   She missed the address on the 6CIT.  She got 29/30 in 2019.   Past Medical History:  Diagnosis Date  . Arthritis    "all over"  . BACK PAIN 03/08/2007  . 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 DEPRESSIVE DISORDER 12/01/2008  . Duodenal ulcer    . GI bleed 09/12/2010   Naproxen induced Endoscopy with dr Harlene Ramus   . HIATAL HERNIA 12/17/2006  . History of hiatal hernia   . HYPERLIPIDEMIA 03/09/2006  . HYPERTENSION 03/09/2006  . KNEE PAIN 05/06/2009  . Pneumonia    33yr. ago- hosp. pneumonia  . Urinary urgency     Past Surgical History:  Procedure Laterality Date  . ABDOMINAL HYSTERECTOMY  1975  . APPLICATION OF A-CELL OF EXTREMITY Right 12/10/2014   Procedure: APPLICATION OF A-CELL OF EXTREMITY;  Surgeon: CTheodoro Kos DO;  [facility]  Service: Plastics;  Laterality: Right;  . arthroscopic knee Right    x 2  . BACK SURGERY  2000   spinal fusion  . BILATERAL SALPINGECTOMY Bilateral 1989   Dr. LMarianna Fuss . CARDIAC CATHETERIZATION    . EYE SURGERY     cataracts bilateral - removed  . HERNIA REPAIR Bilateral    inguinal   . I&D EXTREMITY Right 12/10/2014   Procedure: IRRIGATION AND DEBRIDEMENT ON RIGHT LEG, ;  Surgeon: CTheodoro Kos DO;  [place] [facility]  Service: Plastics;  Laterality: Right;  . I&D EXTREMITY Right 01/20/2015   Procedure: IRRIGATION AND DEBRIDEMENT RIGHT LOWER LEG WOUND, with ACELL to  Donor Site, SKIN GRAFT AND VAC Placement;  Surgeon: Theodoro Kos, DO;  Location: [facility];  Service: Plastics;  Laterality: Right;  . LUMBAR LAMINECTOMY  02-17-1999   with sinal fusion L3,4,5,&S1  . moes     moses surgery on R lle  . OOPHORECTOMY  1989  . RESECTION DISTAL CLAVICAL    . ROTATOR CUFF REPAIR Right 2001   x2  . SKIN GRAFT Right 05/29/2018  . THYROIDECTOMY    . TONSILLECTOMY  1928  . TUBAL LIGATION      Allergies  Allergen Reactions  . Naproxen     Gi bleed  . Nsaids     GI BLEED  . Aspirin     GI bleed  . Ace Inhibitors     Cough   . Caffeine Other (See Comments)    Causes joints to be painful    Outpatient Encounter Medications as of 04/09/2019  Medication Sig  . acetaminophen (TYLENOL) 650 MG CR tablet Take 1 tablet (650 mg total) by mouth as needed (for pain).  . Cholecalciferol (VITAMIN  D3) 50 MCG (2000 UT) TABS Take 1 tablet by mouth daily.  Marland Kitchen denosumab (PROLIA) 60 MG/ML SOSY injection Inject 60 mg into the skin every 6 (six) months.  Marland Kitchen losartan (COZAAR) 50 MG tablet TAKE 1 TABLET DAILY  . Lutein 20 MG CAPS Take 20 mg by mouth daily.    No facility-administered encounter medications on file as of 04/09/2019.     Review of Systems:  Review of Systems  Constitutional: Negative for chills, fever and malaise/fatigue.  HENT: Negative for congestion and hearing loss.   Eyes: Negative for blurred vision.  Respiratory: Negative for cough and shortness of breath.   Cardiovascular: Negative for chest pain, palpitations and leg swelling.  Gastrointestinal: Positive for constipation and melena. Negative for abdominal pain, blood in stool and diarrhea.  Genitourinary: Negative for dysuria.  Musculoskeletal: Positive for joint pain. Negative for falls.       Left hip  Skin: Negative for itching and rash.  Neurological: Negative for dizziness and loss of consciousness.  Endo/Heme/Allergies: Bruises/bleeds easily.  Psychiatric/Behavioral: Positive for memory loss. Negative for depression. The patient is not nervous/anxious and does not have insomnia.     Health Maintenance  Topic Date Due  . TETANUS/TDAP  12/12/2022  . INFLUENZA VACCINE  Completed  . DEXA SCAN  Completed  . PNA vac Low Risk Adult  Completed    Physical Exam: Vitals:   04/09/19 1336  BP: 132/86  Pulse: 74  Temp: 97.7 F (36.5 C)  TempSrc: Oral  SpO2: 98%  Weight: 130 lb 9.6 oz (59.2 kg)  Height: 5' (1.524 m)   Body mass index is 25.51 kg/m. Physical Exam Vitals signs reviewed.  Constitutional:      General: She is not in acute distress.    Appearance: Normal appearance. She is not toxic-appearing.  HENT:     Head: Normocephalic and atraumatic.  Cardiovascular:     Rate and Rhythm: Normal rate and regular rhythm.     Pulses: Normal pulses.     Heart sounds: Normal heart sounds.  Pulmonary:      Effort: Pulmonary effort is normal.     Breath sounds: Normal breath sounds. No wheezing, rhonchi or rales.  Abdominal:     General: Bowel sounds are normal.  Musculoskeletal: Normal range of motion.     Comments: Walks with rollator walker; tender over left lateral hip  Skin:    General: Skin is  warm and dry.  Neurological:     General: No focal deficit present.     Mental Status: She is alert and oriented to person, place, and time.     Cranial Nerves: No cranial nerve deficit.     Comments: Struggles with word-finding  Psychiatric:        Mood and Affect: Mood normal.        Behavior: Behavior normal.     Labs reviewed: Basic Metabolic Panel: Recent Labs    08/20/18 0400  NA 141  K 3.9  BUN 25*  CREATININE 0.6   Liver Function Tests: Recent Labs    08/20/18 0400  AST 13  ALT 7  ALKPHOS 53   No results for input(s): LIPASE, AMYLASE in the last 8760 hours. No results for input(s): AMMONIA in the last 8760 hours. CBC: Recent Labs    08/20/18 0400  WBC 5.2  HGB 12.8  HCT 38  PLT 185   Lipid Panel: Recent Labs    08/20/18 0400  CHOL 238*  HDL 67  LDLCALC 154  TRIG 85   Assessment/Plan 1. Melena -suspect not really melena but due to greens intake -check FOBT home kit when she notes dark stool and [facility]  2. Mild cognitive impairment with memory loss -minimal memory loss, mostly word-finding difficulty  3. Word finding difficulty -encouraged regular brain exercises and phone calls, zooms to keep mind active, continue reading -did not want speech therapy for this as I suggested--will let me know if it progresses and she changes her mind  4. Trochanteric bursitis of left hip -to use voltaren gel otc she got on this -if not getting better, ortho referral for injection but avoiding due to covid in community at present  5. Essential hypertension -bp at goal, cont same regimen  6. Senile osteoporosis -cont prolia and D3, walking for  weightbearing exercise  7. Chronic constipation -continue prunes and greens to help this but has to keep a careful balance -hydration encouraged   Labs/tests ordered:  FOBT kit to be mailed Next appt:  08/06/19 at [facility]. Aleiya Rye, D.O. [facility] [address], [place] [address] Cell Phone (Mon-Fri 8am-5pm):  [phone] On Call:  [phone] & follow prompts after 5pm & weekends Office Phone:  [phone] Office Fax:  [phone]

## 2019-04-15 ENCOUNTER — Other Ambulatory Visit: Payer: Self-pay

## 2019-04-15 ENCOUNTER — Ambulatory Visit
Admission: RE | Admit: 2019-04-15 | Discharge: 2019-04-15 | Disposition: A | Discharge status: Home / Self Care | Payer: Medicare HMO | Source: Ambulatory Visit | Attending: Nurse Practitioner | Admitting: Nurse Practitioner

## 2019-04-15 DIAGNOSIS — Z780 Asymptomatic menopausal state: Secondary | ICD-10-CM | POA: Diagnosis not present

## 2019-04-15 DIAGNOSIS — E2839 Other primary ovarian failure: Secondary | ICD-10-CM

## 2019-04-15 DIAGNOSIS — M85852 Other specified disorders of bone density and structure, left thigh: Secondary | ICD-10-CM | POA: Diagnosis not present

## 2019-04-15 DIAGNOSIS — M810 Age-related osteoporosis without current pathological fracture: Secondary | ICD-10-CM | POA: Diagnosis not present

## 2019-06-05 DIAGNOSIS — Z20828 Contact with and (suspected) exposure to other viral communicable diseases: Secondary | ICD-10-CM | POA: Diagnosis not present

## 2019-06-06 ENCOUNTER — Telehealth: Payer: Self-pay

## 2019-06-06 NOTE — Telephone Encounter (Signed)
Patient states that she was exposed to COVID-19 by her neighbor and was put into isolation yesterday. Patient went and got COVID testing yesterday and still has no results.  Patient spoke with Nurse and nurse recommended taking Vitamin C,D, and Zinc.  Patient want's to know how much of these vitamins  she should be taking. Please Advise.

## 2019-06-06 NOTE — Telephone Encounter (Signed)
Spoke with patient and advised results, I advised to patient that she can ask the independent nurse at [facility] to help with getting these vitamins.

## 2019-06-06 NOTE — Telephone Encounter (Signed)
I'm sorry to hear this.  I hope she tests negative.  Vitamin doses:   Vitamin C 500mg  daily, Vitamin D 4000 units daily and Zinc100mg  po daily,  Quercetin 250mg  po bid x 14 days  These may help decrease the duration or severity of symptoms if she does develop covid.

## 2019-06-06 NOTE — Telephone Encounter (Signed)
Vanessa Mason would like to know what type of supplements and doses to give her mom  since they have been put on lock quarantined , the suggested Zink, Vitamin C  But did not give a dosage to f give her please advise

## 2019-06-09 NOTE — Telephone Encounter (Signed)
I referred to pass note ,  called [name]  Daughter) and gave her the requested vitamins  and MG

## 2019-06-09 NOTE — Telephone Encounter (Signed)
Please refer to my response to prior phone call.

## 2019-06-20 DIAGNOSIS — L821 Other seborrheic keratosis: Secondary | ICD-10-CM | POA: Diagnosis not present

## 2019-06-20 DIAGNOSIS — D485 Neoplasm of uncertain behavior of skin: Secondary | ICD-10-CM | POA: Diagnosis not present

## 2019-06-20 DIAGNOSIS — C44629 Squamous cell carcinoma of skin of left upper limb, including shoulder: Secondary | ICD-10-CM | POA: Diagnosis not present

## 2019-06-20 DIAGNOSIS — C44729 Squamous cell carcinoma of skin of left lower limb, including hip: Secondary | ICD-10-CM | POA: Diagnosis not present

## 2019-06-20 DIAGNOSIS — L905 Scar conditions and fibrosis of skin: Secondary | ICD-10-CM | POA: Diagnosis not present

## 2019-06-20 DIAGNOSIS — Z85828 Personal history of other malignant neoplasm of skin: Secondary | ICD-10-CM | POA: Diagnosis not present

## 2019-07-25 DIAGNOSIS — D485 Neoplasm of uncertain behavior of skin: Secondary | ICD-10-CM | POA: Diagnosis not present

## 2019-07-25 DIAGNOSIS — C44629 Squamous cell carcinoma of skin of left upper limb, including shoulder: Secondary | ICD-10-CM | POA: Diagnosis not present

## 2019-07-25 DIAGNOSIS — C44729 Squamous cell carcinoma of skin of left lower limb, including hip: Secondary | ICD-10-CM | POA: Diagnosis not present

## 2019-08-06 ENCOUNTER — Non-Acute Institutional Stay: Payer: Medicare HMO | Admitting: Internal Medicine

## 2019-08-06 ENCOUNTER — Encounter: Payer: Self-pay | Admitting: Internal Medicine

## 2019-08-06 ENCOUNTER — Other Ambulatory Visit: Payer: Self-pay

## 2019-08-06 VITALS — BP 134/86 | HR 90 | Temp 97.9°F | Ht 60.0 in | Wt 131.6 lb

## 2019-08-06 DIAGNOSIS — L03116 Cellulitis of left lower limb: Secondary | ICD-10-CM | POA: Diagnosis not present

## 2019-08-06 DIAGNOSIS — M810 Age-related osteoporosis without current pathological fracture: Secondary | ICD-10-CM

## 2019-08-06 DIAGNOSIS — I10 Essential (primary) hypertension: Secondary | ICD-10-CM

## 2019-08-06 DIAGNOSIS — R4789 Other speech disturbances: Secondary | ICD-10-CM | POA: Diagnosis not present

## 2019-08-06 DIAGNOSIS — M1712 Unilateral primary osteoarthritis, left knee: Secondary | ICD-10-CM

## 2019-08-06 DIAGNOSIS — G3184 Mild cognitive impairment, so stated: Secondary | ICD-10-CM

## 2019-08-06 DIAGNOSIS — C44729 Squamous cell carcinoma of skin of left lower limb, including hip: Secondary | ICD-10-CM | POA: Diagnosis not present

## 2019-08-06 MED ORDER — DOXYCYCLINE HYCLATE 100 MG PO TABS: 100.0000 mg | ORAL_TABLET | Freq: Two times a day (BID) | ORAL | 0 refills | Status: DC

## 2019-08-06 NOTE — Patient Instructions (Signed)
Finish off your vitamin D 5000 units.  Then return to 2000 units daily. Take doxycycline 100mg  po bid for 10 days for left leg cellulitis We'll look into evenity for your osteoporosis.  If it's too expensive, we'll stick with vitamin D alone.

## 2019-08-06 NOTE — Progress Notes (Signed)
Location:  [facility] of Service:  Clinic (12)  Provider: Michele Judy L. Mariea Clonts, D.O., C.M.D.  Code Status: DNR Goals of Care:  Advanced Directives 08/06/2019  Does Patient Have a Medical Advance Directive? Yes  Type of Advance Directive Out of facility DNR (pink MOST or yellow form)  Does patient want to make changes to medical advance directive? No - Patient declined  Copy of [place] in Chart? -  Pre-existing out of facility DNR order (yellow form or pink MOST form) -   Chief Complaint  Patient presents with  . Medical Management of Chronic Issues    4 monrh follow  up     HPI: Patient is a 86 y.o. female seen today for medical management of chronic diseases.    She has not seen any of her kids in person since March.  They've done a lot of facetime.  Her 89th bday is coming up 3/23.  Her daughter from [place] is coming up and taking her with her to the [place] area where another daughter is located.  Her son and his wife are coming from [place].  She will have 3/5.  The others are in [place] and [place].  The one in [place] only just got his first vaccine.  The one in [place] has medical problems.    She has been going to a new dermatologist, Dr. Karin Golden.  She had two surgeries on the left leg--superior one is ca and the inferior one is not.    BP is good on losartan.    Takes lutein for her eyes.  Dr. Ellie Lunch agrees with it.    She's not able to do weightbearing exercise anymore.  continues on her vitamin D3.  Walks to her meal.   T score -4.3 2 years ago was -4.5.     Past Medical History:  Diagnosis Date  . Arthritis    "all over"  . BACK PAIN 03/08/2007  . 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 DEPRESSIVE DISORDER 12/01/2008  . Duodenal ulcer   . GI bleed 09/12/2010   Naproxen induced Endoscopy with dr Harlene Ramus   . HIATAL HERNIA 12/17/2006  . History of hiatal hernia   . HYPERLIPIDEMIA 03/09/2006  .  HYPERTENSION 03/09/2006  . KNEE PAIN 05/06/2009  . Pneumonia    57yrs. ago- hosp. pneumonia  . Urinary urgency     Past Surgical History:  Procedure Laterality Date  . ABDOMINAL HYSTERECTOMY  1975  . APPLICATION OF A-CELL OF EXTREMITY Right 12/10/2014   Procedure: APPLICATION OF A-CELL OF EXTREMITY;  Surgeon: Theodoro Kos, DO;  Location: [place];  Service: Plastics;  Laterality: Right;  . arthroscopic knee Right    x 2  . BACK SURGERY  2000   spinal fusion  . BILATERAL SALPINGECTOMY Bilateral 1989   Dr. Marianna Fuss  . CARDIAC CATHETERIZATION    . EYE SURGERY     cataracts bilateral - removed  . HERNIA REPAIR Bilateral    inguinal   . I & D EXTREMITY Right 12/10/2014   Procedure: IRRIGATION AND DEBRIDEMENT ON RIGHT LEG, ;  Surgeon: Theodoro Kos, DO;  Location: [place];  Service: Plastics;  Laterality: Right;  . I & D EXTREMITY Right 01/20/2015   Procedure: IRRIGATION AND DEBRIDEMENT RIGHT LOWER LEG WOUND, with ACELL to Donor Site, SKIN GRAFT AND VAC Placement;  Surgeon: Theodoro Kos, DO;  Location: [place];  Service: Plastics;  Laterality: Right;  . LUMBAR LAMINECTOMY  02-17-1999   with sinal fusion L3,4,5,&S1  . moes     moses surgery on R lle  . OOPHORECTOMY  1989  . RESECTION DISTAL CLAVICAL    . ROTATOR CUFF REPAIR Right 2001   x2  . SKIN GRAFT Right 05/29/2018  . THYROIDECTOMY    . TONSILLECTOMY  1928  . TUBAL LIGATION      Allergies  Allergen Reactions  . Naproxen     Gi bleed  . Nsaids     GI BLEED  . Aspirin     GI bleed  . Ace Inhibitors     Cough   . Caffeine Other (See Comments)    Causes joints to be painful    Outpatient Encounter Medications as of 08/06/2019  Medication Sig  . acetaminophen (TYLENOL) 650 MG CR tablet Take 1 tablet (650 mg total) by mouth as needed (for pain).  . Cholecalciferol (VITAMIN D3) 50 MCG (2000 UT) TABS Take 1 tablet by mouth daily.  Marland Kitchen denosumab (PROLIA) 60 MG/ML SOSY injection Inject 60 mg into the skin every 6 (six) months.  Marland Kitchen  losartan (COZAAR) 50 MG tablet TAKE 1 TABLET DAILY  . Lutein 20 MG CAPS Take 20 mg by mouth daily.    No facility-administered encounter medications on file as of 08/06/2019.    Review of Systems:  Review of Systems  Constitutional: Negative for chills, fever and malaise/fatigue.  HENT: Positive for hearing loss.   Eyes: Negative for blurred vision.  Respiratory: Negative for cough and shortness of breath.   Cardiovascular: Negative for chest pain, palpitations and leg swelling.  Gastrointestinal: Negative for abdominal pain, constipation, diarrhea, melena, nausea and vomiting.  Genitourinary: Negative for dysuria.  Musculoskeletal: Positive for joint pain. Negative for falls.  Skin: Positive for rash.       Erythema of left leg b/w two surgeries  Neurological: Negative for dizziness and loss of consciousness.  Endo/Heme/Allergies: Bruises/bleeds easily.  Psychiatric/Behavioral: Positive for memory loss. Negative for depression. The patient is not nervous/anxious and does not have insomnia.     Health Maintenance  Topic Date Due  . TETANUS/TDAP  12/12/2022  . INFLUENZA VACCINE  Completed  . DEXA SCAN  Completed  . PNA vac Low Risk Adult  Completed    Physical Exam: Vitals:   08/06/19 0909  BP: 134/86  Pulse: 90  Temp: 97.9 F (36.6 C)  TempSrc: Temporal  SpO2: 92%  Weight: 131 lb 9.6 oz (59.7 kg)  Height: 5' (1.524 m)   Body mass index is 25.7 kg/m. Physical Exam Vitals reviewed.  Constitutional:      General: She is not in acute distress.    Appearance: Normal appearance. She is not toxic-appearing.  HENT:     Head: Normocephalic and atraumatic.  Cardiovascular:     Rate and Rhythm: Normal rate and regular rhythm.     Pulses: Normal pulses.     Heart sounds: Normal heart sounds.  Pulmonary:     Effort: Pulmonary effort is normal.     Breath sounds: Normal breath sounds. No wheezing, rhonchi or rales.  Abdominal:     General: Bowel sounds are normal.      Palpations: Abdomen is soft.  Skin:    Comments: Erythema b/w two biopsy/surgical sites on left leg vs right, slight warmth pt notes (I did not feel warmth), pain at sites, no drainage, two sites left open to air  Neurological:     General: No focal  deficit present.     Mental Status: She is alert and oriented to person, place, and time.     Gait: Gait abnormal.     Comments: Word finding difficulty (doing better today than many other visits), using rollator walker   Psychiatric:        Mood and Affect: Mood normal.        Behavior: Behavior normal.     Labs reviewed: Basic Metabolic Panel: Recent Labs    08/20/18 0400  NA 141  K 3.9  BUN 25*  CREATININE 0.6   Liver Function Tests: Recent Labs    08/20/18 0400  AST 13  ALT 7  ALKPHOS 53   No results for input(s): LIPASE, AMYLASE in the last 8760 hours. No results for input(s): AMMONIA in the last 8760 hours. CBC: Recent Labs    08/20/18 0400  WBC 5.2  HGB 12.8  HCT 38  PLT 185   Lipid Panel: Recent Labs    08/20/18 0400  CHOL 238*  HDL 67  LDLCALC 154  TRIG 85   No results found for: HGBA1C   Assessment/Plan 1. Left -leg cellulitis -with prior severe MRSA infection of right leg, I'm being extra cautious here and want to treat her erythema of the left leg as cellulitis--Rx sent to closest [facility] that delivers to [facility] - doxycycline (VIBRA-TABS) 100 MG tablet; Take 1 tablet (100 mg total) by mouth 2 (two) times daily.  Dispense: 20 tablet; Refill: 0--counseled to complete full course  2. Mild cognitive impairment with memory loss -ongoing--given written instructions due to this -f/u MMSE at [facility]  3. Essential hypertension -bp at goal with losartan, cont same, check bmp before next visit  4. Primary osteoarthritis of left knee -continue rollator use, pain prevents regular deliberate weightbearing exercise and not getting good relief with tylenol and topicals -had gi bleeding with nsaids  5. Senile  osteoporosis -has been on prolia several years w/o improvement, in fact deterioration of bone density, likely b/c cannot do weightbearing exercise -will check on evenity for her--if affordable to get in office setting, she's willing to come to [facility] monthly to get it -T score -4.5 down from -4.3  6. Word finding difficulty -is pretty stable--doing better today than prior visits with this  Labs/tests ordered:  Cbc, cmp Next appt:  4 mos med mgt, fasting labs before  Keisa Blow L. Alexxis Mackert, D.O. [facility] [address], [place] [address] Cell Phone (Mon-Fri 8am-5pm):  [phone] On Call:  [phone] & follow prompts after 5pm & weekends Office Phone:  [phone] Office Fax:  [phone]

## 2019-08-07 ENCOUNTER — Telehealth: Payer: Self-pay | Admitting: *Deleted

## 2019-08-07 NOTE — Telephone Encounter (Signed)
Patient notified and agreed. Extension given.

## 2019-08-07 NOTE — Telephone Encounter (Signed)
FYI: Patient called and stated that she was calling to let you know she saw Dr. Winifred Olive, [facility], yesterday afternoon regarding her Left Leg and he agreed with you regarding the antibiotic Doxycycline. Stated that he also placed a [employer] on her left lower leg and that she goes back in 1 week to have it removed and evaluated.

## 2019-08-07 NOTE — Telephone Encounter (Signed)
Ok good.  I appreciate her update.  If she has any concerns in the interim, she can certainly call us.

## 2019-09-01 ENCOUNTER — Other Ambulatory Visit: Payer: Self-pay

## 2019-09-01 MED ORDER — CITALOPRAM HYDROBROMIDE 10 MG PO TABS: 10.0000 mg | ORAL_TABLET | Freq: Every day | ORAL | 6 refills | Status: DC

## 2019-09-01 NOTE — Telephone Encounter (Signed)
Patient daughter "Vanessa Mason" called and states that her mother "Vanessa Mason" needs her medication 'Celexa". Patient daughter states that she's not sure if medication is still eligible to be refilled. Patient is visiting daughter in [place], [place] and [facility] [address]  MPlease Advise.

## 2019-09-02 MED ORDER — CITALOPRAM HYDROBROMIDE 10 MG PO TABS: 10.0000 mg | ORAL_TABLET | Freq: Every day | ORAL | 1 refills | Status: DC

## 2019-09-02 NOTE — Addendum Note (Signed)
Addended by: Logan Bores on: 09/02/2019 10:25 AM   Modules accepted: Orders

## 2019-09-02 NOTE — Telephone Encounter (Signed)
Vanessa Mason called to check the status of message, states she has not heard anything back. I informed Vanessa Mason that RX was approved yesterday. Vanessa Mason asked if patient can have a 90 day supply at a time vs 30. New RX submitted to the pharmacy with the notation to void rx for 30 day supply

## 2019-09-10 DIAGNOSIS — H353112 Nonexudative age-related macular degeneration, right eye, intermediate dry stage: Secondary | ICD-10-CM | POA: Diagnosis not present

## 2019-09-12 ENCOUNTER — Other Ambulatory Visit: Payer: Self-pay | Admitting: Internal Medicine

## 2019-09-12 DIAGNOSIS — H35423 Microcystoid degeneration of retina, bilateral: Secondary | ICD-10-CM | POA: Diagnosis not present

## 2019-09-12 DIAGNOSIS — H353221 Exudative age-related macular degeneration, left eye, with active choroidal neovascularization: Secondary | ICD-10-CM | POA: Diagnosis not present

## 2019-09-12 DIAGNOSIS — H353112 Nonexudative age-related macular degeneration, right eye, intermediate dry stage: Secondary | ICD-10-CM | POA: Diagnosis not present

## 2019-09-12 DIAGNOSIS — I10 Essential (primary) hypertension: Secondary | ICD-10-CM

## 2019-09-12 DIAGNOSIS — H43813 Vitreous degeneration, bilateral: Secondary | ICD-10-CM | POA: Diagnosis not present

## 2019-09-12 NOTE — Telephone Encounter (Signed)
rx sent to pharmacy by e-script  

## 2019-09-16 DIAGNOSIS — H353221 Exudative age-related macular degeneration, left eye, with active choroidal neovascularization: Secondary | ICD-10-CM | POA: Diagnosis not present

## 2019-09-18 ENCOUNTER — Encounter: Payer: Self-pay | Admitting: Internal Medicine

## 2019-09-18 DIAGNOSIS — D485 Neoplasm of uncertain behavior of skin: Secondary | ICD-10-CM | POA: Diagnosis not present

## 2019-09-18 DIAGNOSIS — L814 Other melanin hyperpigmentation: Secondary | ICD-10-CM | POA: Diagnosis not present

## 2019-09-18 DIAGNOSIS — C44729 Squamous cell carcinoma of skin of left lower limb, including hip: Secondary | ICD-10-CM | POA: Diagnosis not present

## 2019-09-18 DIAGNOSIS — L821 Other seborrheic keratosis: Secondary | ICD-10-CM | POA: Diagnosis not present

## 2019-09-30 DIAGNOSIS — C44729 Squamous cell carcinoma of skin of left lower limb, including hip: Secondary | ICD-10-CM | POA: Diagnosis not present

## 2019-10-08 ENCOUNTER — Encounter: Payer: Self-pay | Admitting: Internal Medicine

## 2019-10-08 ENCOUNTER — Ambulatory Visit: Payer: Self-pay

## 2019-10-08 ENCOUNTER — Non-Acute Institutional Stay: Payer: Medicare HMO | Admitting: Internal Medicine

## 2019-10-08 ENCOUNTER — Other Ambulatory Visit: Payer: Self-pay

## 2019-10-08 VITALS — BP 118/72 | HR 65 | Temp 97.7°F | Ht 60.0 in | Wt 123.0 lb

## 2019-10-08 DIAGNOSIS — N3941 Urge incontinence: Secondary | ICD-10-CM

## 2019-10-08 DIAGNOSIS — R3915 Urgency of urination: Secondary | ICD-10-CM | POA: Diagnosis not present

## 2019-10-08 DIAGNOSIS — R4789 Other speech disturbances: Secondary | ICD-10-CM | POA: Diagnosis not present

## 2019-10-08 DIAGNOSIS — M810 Age-related osteoporosis without current pathological fracture: Secondary | ICD-10-CM | POA: Diagnosis not present

## 2019-10-08 DIAGNOSIS — G3184 Mild cognitive impairment, so stated: Secondary | ICD-10-CM

## 2019-10-08 DIAGNOSIS — R630 Anorexia: Secondary | ICD-10-CM | POA: Diagnosis not present

## 2019-10-08 NOTE — Progress Notes (Signed)
Location:  [place] of Service:  Clinic (12)  Provider: Sherri Mcarthy L. Mariea Clonts, D.O., C.M.D.  Code Status: DNR, discussed need for MOST (to do next visit) Goals of Care:  Advanced Directives 10/08/2019  Does Patient Have a Medical Advance Directive? Yes  Type of Advance Directive Out of facility DNR (pink MOST or yellow form)  Does patient want to make changes to medical advance directive? No - Patient declined  Copy of [place] in Chart? -  Pre-existing out of facility DNR order (yellow form or pink MOST form) -     Chief Complaint  Patient presents with  . Acute Visit    Vanessa Mason has questions about care     HPI: Patient is a 86 y.o. female seen today for an acute visit for profound fatigue, incontinence and a change in one of her meds (per her daughter, Vanessa Mason, who is on facetime).    Her biggest problem for herself is incontinence--driving her crazy.  At night she gets up numerous times--up 8 times to urinate.  She also has some urgency to pee when sitting, but just dribbles out when stands, then cannot void--the urge is gone.    She's had profound fatigue, drowsiness and takes hours of naps.  She does not feel guilty.    She is confused, disorganized, disoriented and overwhelmed.  Worse over the past three weeks.  She has also had some anxiety.  She struggles to find words, where she puts things or that she peed in puddles more.  Some confusion of dates.  She remembers all meds and knee mgt.  Could name all of the others at her bday.  Does lose train of thought some.    She's not hungry like she used to be.  She's lost some weight.   She has lost 7-8 lbs since last visit in March.    She has her chronic lower back pain and hips.  Memory foam on bed helped.  Uses tylenol at times.    Celexa resuming seemed also to correlate with the complaints above.  She stopped her celexa after taking every other day for a week in July.  They had opted to resume  about a month ago, she was weepy and teary.     Past Medical History:  Diagnosis Date  . Arthritis    "all over"  . BACK PAIN 03/08/2007  . 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 DEPRESSIVE DISORDER 12/01/2008  . Duodenal ulcer   . GI bleed 09/12/2010   Naproxen induced Endoscopy with dr Harlene Ramus   . HIATAL HERNIA 12/17/2006  . History of hiatal hernia   . HYPERLIPIDEMIA 03/09/2006  . HYPERTENSION 03/09/2006  . KNEE PAIN 05/06/2009  . Pneumonia    38yrs. ago- hosp. pneumonia  . Urinary urgency     Past Surgical History:  Procedure Laterality Date  . ABDOMINAL HYSTERECTOMY  1975  . APPLICATION OF A-CELL OF EXTREMITY Right 12/10/2014   Procedure: APPLICATION OF A-CELL OF EXTREMITY;  Surgeon: Theodoro Kos, DO;  Location: [place];  Service: Plastics;  Laterality: Right;  . arthroscopic knee Right    x 2  . BACK SURGERY  2000   spinal fusion  . BILATERAL SALPINGECTOMY Bilateral 1989   Dr. Marianna Fuss  . CARDIAC CATHETERIZATION    . EYE SURGERY     cataracts bilateral - removed  . HERNIA REPAIR Bilateral    inguinal   .  I & D EXTREMITY Right 12/10/2014   Procedure: IRRIGATION AND DEBRIDEMENT ON RIGHT LEG, ;  Surgeon: Theodoro Kos, DO;  Location: [place];  Service: Plastics;  Laterality: Right;  . I & D EXTREMITY Right 01/20/2015   Procedure: IRRIGATION AND DEBRIDEMENT RIGHT LOWER LEG WOUND, with ACELL to Donor Site, SKIN GRAFT AND VAC Placement;  Surgeon: Theodoro Kos, DO;  Location: [place];  Service: Plastics;  Laterality: Right;  . LUMBAR LAMINECTOMY  02-17-1999   with sinal fusion L3,4,5,&S1  . moes     moses surgery on R lle  . OOPHORECTOMY  1989  . RESECTION DISTAL CLAVICAL    . ROTATOR CUFF REPAIR Right 2001   x2  . SKIN GRAFT Right 05/29/2018  . THYROIDECTOMY    . TONSILLECTOMY  1928  . TUBAL LIGATION      Allergies  Allergen Reactions  . Naproxen     Gi bleed  . Nsaids     GI BLEED  . Aspirin     GI bleed  . Ace Inhibitors     Cough   .  Caffeine Other (See Comments)    Causes joints to be painful    Outpatient Encounter Medications as of 10/08/2019  Medication Sig  . acetaminophen (TYLENOL) 650 MG CR tablet Take 1 tablet (650 mg total) by mouth as needed (for pain).  . Apoaequorin (PREVAGEN) 10 MG CAPS Take by mouth.  . Cholecalciferol (VITAMIN D3) 50 MCG (2000 UT) TABS Take 1 tablet by mouth daily.  . citalopram (CELEXA) 10 MG tablet Take 1 tablet (10 mg total) by mouth daily.  Marland Kitchen denosumab (PROLIA) 60 MG/ML SOSY injection Inject 60 mg into the skin every 6 (six) months.  Marland Kitchen losartan (COZAAR) 50 MG tablet TAKE 1 TABLET DAILY  . Multiple Vitamins-Minerals (PRESERVISION AREDS 2+MULTI VIT PO) Take by mouth.  . [DISCONTINUED] doxycycline (VIBRA-TABS) 100 MG tablet Take 1 tablet (100 mg total) by mouth 2 (two) times daily.  . [DISCONTINUED] Lutein 20 MG CAPS Take 20 mg by mouth daily.    No facility-administered encounter medications on file as of 10/08/2019.    Review of Systems:  Review of Systems  Constitutional: Positive for malaise/fatigue and weight loss. Negative for chills and fever.  HENT: Positive for hearing loss. Negative for congestion.   Eyes: Negative for blurred vision.  Respiratory: Negative for cough and shortness of breath.   Cardiovascular: Negative for chest pain, palpitations and leg swelling.  Gastrointestinal: Negative for abdominal pain, blood in stool, constipation, diarrhea and melena.  Genitourinary: Positive for frequency and urgency. Negative for dysuria, flank pain and hematuria.  Musculoskeletal: Positive for joint pain. Negative for falls.  Skin: Negative for itching and rash.  Neurological: Negative for dizziness, loss of consciousness and weakness.  Psychiatric/Behavioral: Positive for depression and memory loss. The patient is not nervous/anxious and does not have insomnia.     Health Maintenance  Topic Date Due  . COVID-19 Vaccine (1) Never done  . INFLUENZA VACCINE  12/28/2019  .  TETANUS/TDAP  12/12/2022  . DEXA SCAN  Completed  . PNA vac Low Risk Adult  Completed    Physical Exam: Vitals:   10/08/19 1148  BP: 118/72  Pulse: 65  Temp: 97.7 F (36.5 C)  SpO2: 92%  Weight: 123 lb (55.8 kg)  Height: 5' (1.524 m)   Body mass index is 24.02 kg/m. Physical Exam Vitals reviewed.  Constitutional:      General: She is not in acute distress.  Appearance: Normal appearance. She is normal weight. She is not toxic-appearing.  HENT:     Head: Normocephalic and atraumatic.     Right Ear: External ear normal.     Left Ear: External ear normal.  Eyes:     Comments: glasses  Cardiovascular:     Rate and Rhythm: Normal rate and regular rhythm.     Pulses: Normal pulses.     Heart sounds: Normal heart sounds.  Pulmonary:     Effort: Pulmonary effort is normal.     Breath sounds: Normal breath sounds. No wheezing, rhonchi or rales.  Abdominal:     General: Bowel sounds are normal. There is no distension.     Palpations: Abdomen is soft.     Tenderness: There is no abdominal tenderness. There is no guarding or rebound.  Musculoskeletal:        General: Normal range of motion.     Right lower leg: No edema.     Left lower leg: No edema.  Skin:    General: Skin is warm and dry.  Neurological:     Mental Status: She is alert.     Cranial Nerves: No cranial nerve deficit.     Gait: Gait abnormal.     Comments: Fully oriented, but some short-term memory loss (made notes about plan for today but forgot she'd made some of them, for example)  Psychiatric:        Mood and Affect: Mood normal.        Behavior: Behavior normal.     Labs reviewed: Labs ordered for AM  Assessment/Plan 1. Mild cognitive impairment with memory loss -has progressed some, but now seems she may have a UTI causing some delirium b/c she's actually been disoriented at times per Vanessa Mason -check cbc, bmp, vitamin D3 and UA c+s  2. Word finding difficulty -does not seem markedly  different than prior visits in the past couple of years  3. Senile osteoporosis -discussed in detail today her bone density results from end of last year--discussed evenity and pt interested in coming to [facility] office to get evenity if it is reasonable for her (covered well by her insurance)--she had not had a statistically significant improvement in her bone density on prolia  -if approved, she will get that in June at her scheduled visit instead of prolia  4. Urinary urgency -newly worse along with some signaling issues it sounds like -will r/o UTI first  5. Urinary incontinence, urge -has had some of this but now worse, r/o UTI, if negative, consider myrbetriq for her  6. Decreased appetite -likely due to advanced age since gradual in onset  Labs/tests ordered:  Cbc, bmp, vitamin D and UA c+s Next appt:  11/03/2019 is next osteoporosis tx; f/u with me in 4 mos for med mgt  One hour spent with pt and her daughter addressing above concerns.    Vanessa Mason L. Paolina Karwowski, D.O. [facility] [address], [place] [address] Cell Phone (Mon-Fri 8am-5pm):  [phone] On Call:  [phone] & follow prompts after 5pm & weekends Office Phone:  [phone] Office Fax:  [phone]

## 2019-10-09 DIAGNOSIS — K922 Gastrointestinal hemorrhage, unspecified: Secondary | ICD-10-CM | POA: Diagnosis not present

## 2019-10-09 DIAGNOSIS — E559 Vitamin D deficiency, unspecified: Secondary | ICD-10-CM | POA: Diagnosis not present

## 2019-10-09 DIAGNOSIS — Z79899 Other long term (current) drug therapy: Secondary | ICD-10-CM | POA: Diagnosis not present

## 2019-10-09 DIAGNOSIS — I10 Essential (primary) hypertension: Secondary | ICD-10-CM | POA: Diagnosis not present

## 2019-10-09 DIAGNOSIS — R319 Hematuria, unspecified: Secondary | ICD-10-CM | POA: Diagnosis not present

## 2019-10-09 DIAGNOSIS — N390 Urinary tract infection, site not specified: Secondary | ICD-10-CM | POA: Diagnosis not present

## 2019-10-09 DIAGNOSIS — D649 Anemia, unspecified: Secondary | ICD-10-CM | POA: Diagnosis not present

## 2019-10-09 DIAGNOSIS — E785 Hyperlipidemia, unspecified: Secondary | ICD-10-CM | POA: Diagnosis not present

## 2019-10-09 LAB — CBC AND DIFFERENTIAL
HCT: 37 (ref 36–46)
Hemoglobin: 12.2 (ref 12.0–16.0)
Platelets: 182 (ref 150–399)
WBC: 5.9

## 2019-10-09 LAB — BASIC METABOLIC PANEL
BUN: 22 — AB (ref 4–21)
CO2: 26 — AB (ref 13–22)
Chloride: 103 (ref 99–108)
Creatinine: 0.6 (ref 0.5–1.1)
Glucose: 88
Potassium: 4.3 (ref 3.4–5.3)
Sodium: 140 (ref 137–147)

## 2019-10-09 LAB — CBC: RBC: 4.22 (ref 3.87–5.11)

## 2019-10-09 LAB — COMPREHENSIVE METABOLIC PANEL: Calcium: 9.7 (ref 8.7–10.7)

## 2019-10-11 MED ORDER — CEPHALEXIN 500 MG PO CAPS: 500.0000 mg | ORAL_CAPSULE | Freq: Two times a day (BID) | ORAL | 0 refills | Status: DC

## 2019-10-11 NOTE — Addendum Note (Signed)
Addended by: Gayland Curry on: 10/11/2019 01:27 PM   Modules accepted: Orders

## 2019-10-13 DIAGNOSIS — L570 Actinic keratosis: Secondary | ICD-10-CM | POA: Diagnosis not present

## 2019-10-14 DIAGNOSIS — H353221 Exudative age-related macular degeneration, left eye, with active choroidal neovascularization: Secondary | ICD-10-CM | POA: Diagnosis not present

## 2019-10-14 DIAGNOSIS — H353112 Nonexudative age-related macular degeneration, right eye, intermediate dry stage: Secondary | ICD-10-CM | POA: Diagnosis not present

## 2019-10-16 ENCOUNTER — Telehealth: Payer: Self-pay | Admitting: *Deleted

## 2019-10-16 ENCOUNTER — Encounter: Payer: Self-pay | Admitting: Internal Medicine

## 2019-10-16 NOTE — Telephone Encounter (Signed)
It won't be a good idea to switch patient medication when traveling I would recommend switching medication when at home then monitor for side effects and effectiveness.

## 2019-10-16 NOTE — Telephone Encounter (Signed)
Patient daughter, Hoyle Sauer called and stated that patient is leaving town tomorrow morning. Stated that patient had recently seen Dr. Mariea Clonts and Dr. Mariea Clonts was talking about switching patient from Celexa to Cymbalta. Stated that the Celexa has also been making patient sleepy.  Stated that Dr. Mariea Clonts wanted her bladder infection to clear up first and then she was going to switch her. Daughter stated that the bladder is much better and she was thinking that while patient had family support it would be a good time to switch from Celexa to Cymbalta.  Please Advise.     [facility] [phone]

## 2019-10-17 NOTE — Telephone Encounter (Signed)
LMOM to return call.

## 2019-10-22 ENCOUNTER — Other Ambulatory Visit: Payer: Self-pay | Admitting: Internal Medicine

## 2019-10-22 DIAGNOSIS — F325 Major depressive disorder, single episode, in full remission: Secondary | ICD-10-CM

## 2019-10-22 MED ORDER — DULOXETINE HCL 20 MG PO CPEP: 20.0000 mg | ORAL_CAPSULE | Freq: Every day | ORAL | 3 refills | Status: DC

## 2019-10-22 NOTE — Telephone Encounter (Signed)
Patient notified and agreed.  

## 2019-10-22 NOTE — Telephone Encounter (Signed)
Patient called and stated that she is back in town and would like for Dr. Mariea Clonts to change her Celexa to Cymbalta.  Stated that she had recently seen Dr. Mariea Clonts and Dr. Mariea Clonts was talking about switching her from Celexa to Cymbalta. Stated that the Celexa has also been making her sleepy. Stated that Dr. Mariea Clonts wanted her bladder infection to clear up first and then she was going to switch her. It has gotten better and now she wants medication switched.  Please Advise.   Pharmacy: [phone]

## 2019-10-22 NOTE — Telephone Encounter (Signed)
Stop celexa (if took today, wait until tomorrow to start new med). Begin duloxetine (cymbalta) 20mg  daily.

## 2019-10-28 ENCOUNTER — Telehealth: Payer: Self-pay | Admitting: *Deleted

## 2019-10-28 NOTE — Telephone Encounter (Signed)
Patient called and stated that she has an appointment on 11/03/19 to get the Prolia Injection but she stated that she thought she remembered Dr. Mariea Clonts stating that it was doing no good and was going to switch to something else. She is calling to confirm this and wants to know what she should do before June 7th.  Please Advise.    OV Note Dated 10/08/2019: 3. Senile osteoporosis -discussed in detail today her bone density results from end of last year--discussed evenity and pt interested in coming to [facility] office to get evenity if it is reasonable for her (covered well by her insurance)--she had not had a statistically significant improvement in her bone density on prolia  -if approved, she will get that in June at her scheduled visit instead of prolia

## 2019-10-28 NOTE — Telephone Encounter (Signed)
Forwarded message to [place].

## 2019-10-28 NOTE — Telephone Encounter (Signed)
Plan is just as written for her to get evenity instead of prolia.  I believe Lattie Haw confirmed that authorization after a long time on the phone.  Please make sure that is true and call pt back.

## 2019-10-28 NOTE — Telephone Encounter (Signed)
Yes, Evenity is correct & has been approved. I have updated the Epic notes & she has appt for Evenity inj at [facility] Friday 11/03/19  Thanks, Lattie Haw

## 2019-10-29 NOTE — Telephone Encounter (Signed)
Patient notified and agreed.  

## 2019-10-30 DIAGNOSIS — Z87891 Personal history of nicotine dependence: Secondary | ICD-10-CM | POA: Diagnosis not present

## 2019-10-30 DIAGNOSIS — Z008 Encounter for other general examination: Secondary | ICD-10-CM | POA: Diagnosis not present

## 2019-10-30 DIAGNOSIS — I10 Essential (primary) hypertension: Secondary | ICD-10-CM | POA: Diagnosis not present

## 2019-10-30 DIAGNOSIS — M1990 Unspecified osteoarthritis, unspecified site: Secondary | ICD-10-CM | POA: Diagnosis not present

## 2019-10-30 DIAGNOSIS — F411 Generalized anxiety disorder: Secondary | ICD-10-CM | POA: Diagnosis not present

## 2019-10-30 DIAGNOSIS — R69 Illness, unspecified: Secondary | ICD-10-CM | POA: Diagnosis not present

## 2019-10-30 DIAGNOSIS — N3946 Mixed incontinence: Secondary | ICD-10-CM | POA: Diagnosis not present

## 2019-10-30 DIAGNOSIS — Z809 Family history of malignant neoplasm, unspecified: Secondary | ICD-10-CM | POA: Diagnosis not present

## 2019-10-30 DIAGNOSIS — Z85828 Personal history of other malignant neoplasm of skin: Secondary | ICD-10-CM | POA: Diagnosis not present

## 2019-11-03 ENCOUNTER — Other Ambulatory Visit: Payer: Self-pay

## 2019-11-03 ENCOUNTER — Ambulatory Visit (INDEPENDENT_AMBULATORY_CARE_PROVIDER_SITE_OTHER): Payer: Medicare HMO

## 2019-11-03 ENCOUNTER — Ambulatory Visit: Payer: Self-pay

## 2019-11-03 DIAGNOSIS — M810 Age-related osteoporosis without current pathological fracture: Secondary | ICD-10-CM

## 2019-11-03 DIAGNOSIS — L905 Scar conditions and fibrosis of skin: Secondary | ICD-10-CM | POA: Diagnosis not present

## 2019-11-03 DIAGNOSIS — Z85828 Personal history of other malignant neoplasm of skin: Secondary | ICD-10-CM | POA: Diagnosis not present

## 2019-11-03 DIAGNOSIS — D485 Neoplasm of uncertain behavior of skin: Secondary | ICD-10-CM | POA: Diagnosis not present

## 2019-11-03 DIAGNOSIS — L280 Lichen simplex chronicus: Secondary | ICD-10-CM | POA: Diagnosis not present

## 2019-11-03 MED ORDER — ROMOSOZUMAB-AQQG 105 MG/1.17ML ~~LOC~~ SOSY
105.0000 mg | PREFILLED_SYRINGE | Freq: Once | SUBCUTANEOUS | Status: AC
  Administered 2019-11-03: 105 mg via SUBCUTANEOUS

## 2019-11-03 NOTE — Addendum Note (Signed)
Addended by: Bonney Leitz T on: 11/03/2019 04:35 PM   Modules accepted: Orders

## 2019-11-11 DIAGNOSIS — H353112 Nonexudative age-related macular degeneration, right eye, intermediate dry stage: Secondary | ICD-10-CM | POA: Diagnosis not present

## 2019-11-11 DIAGNOSIS — H353221 Exudative age-related macular degeneration, left eye, with active choroidal neovascularization: Secondary | ICD-10-CM | POA: Diagnosis not present

## 2019-11-18 DIAGNOSIS — R69 Illness, unspecified: Secondary | ICD-10-CM | POA: Diagnosis not present

## 2019-12-02 DIAGNOSIS — M810 Age-related osteoporosis without current pathological fracture: Secondary | ICD-10-CM | POA: Diagnosis not present

## 2019-12-02 DIAGNOSIS — I10 Essential (primary) hypertension: Secondary | ICD-10-CM | POA: Diagnosis not present

## 2019-12-02 LAB — CBC AND DIFFERENTIAL
HCT: 36 (ref 36–46)
Hemoglobin: 12 (ref 12.0–16.0)
Platelets: 179 (ref 150–399)
WBC: 5.8

## 2019-12-02 LAB — COMPREHENSIVE METABOLIC PANEL: Calcium: 9.2 (ref 8.7–10.7)

## 2019-12-02 LAB — BASIC METABOLIC PANEL
BUN: 23 — AB (ref 4–21)
CO2: 26 — AB (ref 13–22)
Chloride: 103 (ref 99–108)
Creatinine: 0.6 (ref 0.5–1.1)
Glucose: 97
Potassium: 4.1 (ref 3.4–5.3)
Sodium: 141 (ref 137–147)

## 2019-12-02 LAB — HEPATIC FUNCTION PANEL
ALT: 13 (ref 7–35)
AST: 21 (ref 13–35)
Alkaline Phosphatase: 68 (ref 25–125)
Bilirubin, Total: 0.7

## 2019-12-02 LAB — CBC: RBC: 4.18 (ref 3.87–5.11)

## 2019-12-03 ENCOUNTER — Other Ambulatory Visit: Payer: Self-pay

## 2019-12-03 ENCOUNTER — Non-Acute Institutional Stay: Payer: Medicare HMO | Admitting: Internal Medicine

## 2019-12-03 ENCOUNTER — Encounter: Payer: Self-pay | Admitting: Internal Medicine

## 2019-12-03 VITALS — BP 128/80 | HR 99 | Temp 96.6°F | Ht 60.0 in | Wt 121.6 lb

## 2019-12-03 DIAGNOSIS — G3184 Mild cognitive impairment, so stated: Secondary | ICD-10-CM | POA: Diagnosis not present

## 2019-12-03 DIAGNOSIS — Z7189 Other specified counseling: Secondary | ICD-10-CM

## 2019-12-03 DIAGNOSIS — R4789 Other speech disturbances: Secondary | ICD-10-CM

## 2019-12-03 DIAGNOSIS — N3941 Urge incontinence: Secondary | ICD-10-CM

## 2019-12-03 NOTE — Progress Notes (Signed)
Location:  Occupational psychologist of Service:  Clinic (12)  Provider: Minnie Legros L. Mariea Clonts, D.O., C.M.D.  Code Status:DNR, discussed some goals of care today  Goals of Care:  Advanced Directives 12/03/2019  Does Patient Have a Medical Advance Directive? Yes  Type of Paramedic of [place];Living will;Out of facility DNR (pink MOST or yellow form)  Does patient want to make changes to medical advance directive? No - Patient declined  Copy of [place] in Chart? Yes - validated most recent copy scanned in chart (See row information)  Pre-existing out of facility DNR order (yellow form or pink MOST form) Yellow form placed in chart (order not valid for inpatient use)     Chief Complaint  Patient presents with  . Medical Management of Chronic Issues    4 month follow-up and discuss labs (copy available)     HPI: Patient is a 86 y.o. female seen today for medical management of chronic diseases.    She feels normal again after being treated for UTI.  She could not hold anything at that point.  Now she can go for 2 hours.  She can make it most of the time.  She wears a pad to be safe.  Still has to be up every 2 hrs overnight.  She wonders if there is any residual infection.  No burning or pressure over her bladder.   Brain fogginess cleared up a lot.  She does grasp things more slowly and has a mild memory deficit. The anxiety improved after UTI cleared.  She has also no longer been tearful since being on the cymbalta.  Her son, Dr. Juanda Chance, said that she was fretting about losing her mind and if she got to 89 and still had a mind, she's not going to lose it.  She does not want to live in a state where she does not know what's going on and rests in bed all day.  She has also said she wants to stop eating and drinking if she got to a point where she was ready to pass away.   She does not want to be taken out of bed and rolled  down the hallway.  She does not want to be kept alive if she has a heart attack.     Past Medical History:  Diagnosis Date  . Arthritis    "all over"  . BACK PAIN 03/08/2007  . 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 DEPRESSIVE DISORDER 12/01/2008  . Duodenal ulcer   . GI bleed 09/12/2010   Naproxen induced Endoscopy with dr Harlene Ramus   . HIATAL HERNIA 12/17/2006  . History of hiatal hernia   . HYPERLIPIDEMIA 03/09/2006  . HYPERTENSION 03/09/2006  . KNEE PAIN 05/06/2009  . Pneumonia    38yrs. ago- hosp. pneumonia  . Urinary urgency     Past Surgical History:  Procedure Laterality Date  . ABDOMINAL HYSTERECTOMY  1975  . APPLICATION OF A-CELL OF EXTREMITY Right 12/10/2014   Procedure: APPLICATION OF A-CELL OF EXTREMITY;  Surgeon: Theodoro Kos, DO;  Location: [facility];  Service: Plastics;  Laterality: Right;  . arthroscopic knee Right    x 2  . BACK SURGERY  2000   spinal fusion  . BILATERAL SALPINGECTOMY Bilateral 1989   Dr. Marianna Fuss  . CARDIAC CATHETERIZATION    . EYE SURGERY     cataracts bilateral - removed  .  HERNIA REPAIR Bilateral    inguinal   . I & D EXTREMITY Right 12/10/2014   Procedure: IRRIGATION AND DEBRIDEMENT ON RIGHT LEG, ;  Surgeon: Theodoro Kos, DO;  Location: [facility];  Service: Plastics;  Laterality: Right;  . I & D EXTREMITY Right 01/20/2015   Procedure: IRRIGATION AND DEBRIDEMENT RIGHT LOWER LEG WOUND, with ACELL to Donor Site, SKIN GRAFT AND VAC Placement;  Surgeon: Theodoro Kos, DO;  Location: [place];  Service: Plastics;  Laterality: Right;  . LUMBAR LAMINECTOMY  02-17-1999   with sinal fusion L3,4,5,&S1  . moes     moses surgery on R lle  . OOPHORECTOMY  1989  . RESECTION DISTAL CLAVICAL    . ROTATOR CUFF REPAIR Right 2001   x2  . SKIN GRAFT Right 05/29/2018  . THYROIDECTOMY    . TONSILLECTOMY  1928  . TUBAL LIGATION      Allergies  Allergen Reactions  . Naproxen     Gi bleed  . Nsaids     GI BLEED  . Aspirin     GI bleed  .  Ace Inhibitors     Cough   . Caffeine Other (See Comments)    Causes joints to be painful    Outpatient Encounter Medications as of 12/03/2019  Medication Sig  . acetaminophen (TYLENOL) 650 MG CR tablet Take 1 tablet (650 mg total) by mouth as needed (for pain).  . Cholecalciferol (VITAMIN D3) 50 MCG (2000 UT) TABS Take 1 tablet by mouth daily.  . DULoxetine (CYMBALTA) 20 MG capsule Take 1 capsule (20 mg total) by mouth daily.  Marland Kitchen losartan (COZAAR) 50 MG tablet TAKE 1 TABLET DAILY  . Multiple Vitamins-Minerals (PRESERVISION AREDS 2+MULTI VIT PO) Take by mouth.  . [DISCONTINUED] Apoaequorin (PREVAGEN) 10 MG CAPS Take by mouth.  . [DISCONTINUED] cephALEXin (KEFLEX) 500 MG capsule Take 1 capsule (500 mg total) by mouth 2 (two) times daily.   No facility-administered encounter medications on file as of 12/03/2019.    Review of Systems:  Review of Systems  Constitutional: Negative for chills, fever and malaise/fatigue.  HENT: Positive for hearing loss. Negative for congestion and sore throat.   Eyes: Negative for blurred vision.  Respiratory: Negative for cough and shortness of breath.   Cardiovascular: Negative for chest pain, palpitations and leg swelling.  Gastrointestinal: Negative for abdominal pain, blood in stool, constipation, diarrhea and melena.  Genitourinary: Negative for dysuria.       Symptoms improved from last visit  Musculoskeletal: Positive for joint pain. Negative for falls.  Skin:       Skin cancer  Neurological: Negative for dizziness and loss of consciousness.  Endo/Heme/Allergies: Bruises/bleeds easily.  Psychiatric/Behavioral: Positive for depression and memory loss. The patient is not nervous/anxious and does not have insomnia.        Depression improved    Health Maintenance  Topic Date Due  . INFLUENZA VACCINE  12/28/2019  . TETANUS/TDAP  12/12/2022  . DEXA SCAN  Completed  . COVID-19 Vaccine  Completed  . PNA vac Low Risk Adult  Completed    Physical  Exam: Vitals:   12/03/19 0829  BP: 128/80  Pulse: 99  Temp: (!) 96.6 F (35.9 C)  TempSrc: Temporal  SpO2: 98%  Weight: 121 lb 9.6 oz (55.2 kg)  Height: 5' (1.524 m)   Body mass index is 23.75 kg/m. Physical Exam Vitals reviewed.  Constitutional:      General: She is not in acute distress.    Appearance: Normal  appearance. She is not ill-appearing or toxic-appearing.  HENT:     Head: Normocephalic and atraumatic.  Cardiovascular:     Rate and Rhythm: Normal rate and regular rhythm.     Pulses: Normal pulses.     Heart sounds: Normal heart sounds.  Pulmonary:     Effort: Pulmonary effort is normal.     Breath sounds: Normal breath sounds. No wheezing, rhonchi or rales.  Abdominal:     General: Bowel sounds are normal.  Musculoskeletal:        General: Normal range of motion.     Right lower leg: No edema.     Left lower leg: No edema.     Comments: Walks with rollator walker  Skin:    General: Skin is warm and dry.  Neurological:     General: No focal deficit present.     Mental Status: She is alert and oriented to person, place, and time.  Psychiatric:        Mood and Affect: Mood normal.        Behavior: Behavior normal.        Thought Content: Thought content normal.     Labs reviewed: Basic Metabolic Panel: Recent Labs    10/09/19 0300  NA 140  K 4.3  CL 103  CO2 26*  BUN 22*  CREATININE 0.6  CALCIUM 9.7   Liver Function Tests: No results for input(s): AST, ALT, ALKPHOS, BILITOT, PROT, ALBUMIN in the last 8760 hours. No results for input(s): LIPASE, AMYLASE in the last 8760 hours. No results for input(s): AMMONIA in the last 8760 hours. CBC: Recent Labs    10/09/19 0300  WBC 5.9  HGB 12.2  HCT 37  PLT 182   Lipid Panel: No results for input(s): CHOL, HDL, LDLCALC, TRIG, CHOLHDL, LDLDIRECT in the last 8760 hours. No results found for: HGBA1C  Procedures since last visit: No results found.  Assessment/Plan 1. Urinary incontinence,  urge -remains, but medications have not helped nor other conservative mgt  -she's living with it  2. Mild cognitive impairment with memory loss -remains mild, family helps some with med mgt, she otherwise remains quite independent and lives in a home here  3. Word finding difficulty -is improved from when she had her UTI but remains mild  4. Counseling regarding advanced care planning and goals of care -spent 35 mins presenting MOST form to her and her daughter, discussing differences b/w legal forms and medical orders, discussing rehab option for care since hospitalization wants to be avoided -copy of a MOST was provided to pt and her daughter was going to find it online to review so they could go over it together on zoom -we will meet in about a month to complete the MOST and goals of care forms in vynca for her  Labs/tests ordered:  No new Next appt:  1 month approximately for MOST and goals of care form in epic   Izaiah Tabb L. Sudiksha Victor, D.O. [facility] [address], [place] [address] Cell Phone (Mon-Fri 8am-5pm):  [phone] On Call:  [phone] & follow prompts after 5pm & weekends Office Phone:  [phone] Office Fax:  [phone]

## 2019-12-04 ENCOUNTER — Other Ambulatory Visit: Payer: Self-pay

## 2019-12-04 ENCOUNTER — Ambulatory Visit (INDEPENDENT_AMBULATORY_CARE_PROVIDER_SITE_OTHER): Payer: Medicare HMO

## 2019-12-04 DIAGNOSIS — M810 Age-related osteoporosis without current pathological fracture: Secondary | ICD-10-CM | POA: Diagnosis not present

## 2019-12-04 MED ORDER — ROMOSOZUMAB-AQQG 105 MG/1.17ML ~~LOC~~ SOSY
210.0000 mg | PREFILLED_SYRINGE | Freq: Once | SUBCUTANEOUS | Status: AC
  Administered 2019-12-04: 210 mg via SUBCUTANEOUS

## 2019-12-09 DIAGNOSIS — H43813 Vitreous degeneration, bilateral: Secondary | ICD-10-CM | POA: Diagnosis not present

## 2019-12-09 DIAGNOSIS — H35423 Microcystoid degeneration of retina, bilateral: Secondary | ICD-10-CM | POA: Diagnosis not present

## 2019-12-09 DIAGNOSIS — H353221 Exudative age-related macular degeneration, left eye, with active choroidal neovascularization: Secondary | ICD-10-CM | POA: Diagnosis not present

## 2019-12-09 DIAGNOSIS — H353112 Nonexudative age-related macular degeneration, right eye, intermediate dry stage: Secondary | ICD-10-CM | POA: Diagnosis not present

## 2019-12-22 ENCOUNTER — Other Ambulatory Visit: Payer: Self-pay | Admitting: Internal Medicine

## 2019-12-31 ENCOUNTER — Encounter: Payer: Medicare HMO | Admitting: Internal Medicine

## 2019-12-31 ENCOUNTER — Other Ambulatory Visit: Payer: Self-pay

## 2020-01-05 ENCOUNTER — Ambulatory Visit (INDEPENDENT_AMBULATORY_CARE_PROVIDER_SITE_OTHER): Payer: Medicare HMO

## 2020-01-05 ENCOUNTER — Other Ambulatory Visit: Payer: Self-pay

## 2020-01-05 DIAGNOSIS — M810 Age-related osteoporosis without current pathological fracture: Secondary | ICD-10-CM | POA: Diagnosis not present

## 2020-01-05 MED ORDER — ROMOSOZUMAB-AQQG 105 MG/1.17ML ~~LOC~~ SOSY
105.0000 mg | PREFILLED_SYRINGE | Freq: Once | SUBCUTANEOUS | Status: AC
Start: 2020-01-05 — End: 2020-01-05
  Administered 2020-01-05: 210 mg via SUBCUTANEOUS

## 2020-01-06 DIAGNOSIS — H353221 Exudative age-related macular degeneration, left eye, with active choroidal neovascularization: Secondary | ICD-10-CM | POA: Diagnosis not present

## 2020-01-06 DIAGNOSIS — H353112 Nonexudative age-related macular degeneration, right eye, intermediate dry stage: Secondary | ICD-10-CM | POA: Diagnosis not present

## 2020-01-14 ENCOUNTER — Other Ambulatory Visit: Payer: Self-pay

## 2020-01-14 DIAGNOSIS — F325 Major depressive disorder, single episode, in full remission: Secondary | ICD-10-CM

## 2020-01-14 MED ORDER — DULOXETINE HCL 20 MG PO CPEP: 20.0000 mg | ORAL_CAPSULE | Freq: Every day | ORAL | 0 refills | Status: DC

## 2020-01-14 NOTE — Telephone Encounter (Signed)
Patient requested a 90-day supply be sent to [facility] mail order pharmacy.

## 2020-01-28 ENCOUNTER — Other Ambulatory Visit: Payer: Self-pay

## 2020-01-28 ENCOUNTER — Encounter: Payer: Self-pay | Admitting: Internal Medicine

## 2020-01-28 ENCOUNTER — Non-Acute Institutional Stay: Payer: Medicare HMO | Admitting: Internal Medicine

## 2020-01-28 VITALS — BP 128/82 | HR 96 | Temp 97.5°F

## 2020-01-28 DIAGNOSIS — Z7189 Other specified counseling: Secondary | ICD-10-CM | POA: Diagnosis not present

## 2020-01-28 NOTE — Progress Notes (Signed)
ACP:  MOST form Vitals:   01/28/20 1110  BP: 128/82  Pulse: 96  Temp: (!) 97.5 F (36.4 C)  SpO2: 97%   Met with Vanessa Mason in person and her daughter, Vanessa Mason, virtually to complete her MOST form today.  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  She does not want her life dragged out if she is ill.    MOST was completed today as follows:   DNR (already has [facility]) Comfort measures--would [facility] to [facility] for her comfort care rather than going out to the hospital.   Antibiotics determined case by case discussed with her or with [name], Vanessa Mason.  Vanessa Mason is closest physically if there is an emergency but is not HCPOA.   No IVFs at end of life. No tube feeding as she's indicated on her living will and remains clear about today.   Vanessa Mason was given her original form to place in the freezer capsule with her other documents here in her [place] garden home.   Memory is clearly getting worse--she was forgetting what our visit was for today.    F/u on 06/02/2020 for med mgt here at [facility], [facility], [facility] 50 mins spent on ACP today  Vanessa Mason, D.O. [facility] [address], [place] [address] Cell Phone (Mon-Fri 8am-5pm):  [phone] On Call:  [phone] & follow prompts after 5pm & weekends Office Phone:  [phone] Office Fax:  [phone]

## 2020-02-03 DIAGNOSIS — H353221 Exudative age-related macular degeneration, left eye, with active choroidal neovascularization: Secondary | ICD-10-CM | POA: Diagnosis not present

## 2020-02-03 DIAGNOSIS — H353112 Nonexudative age-related macular degeneration, right eye, intermediate dry stage: Secondary | ICD-10-CM | POA: Diagnosis not present

## 2020-02-09 ENCOUNTER — Ambulatory Visit: Payer: Medicare HMO

## 2020-02-09 ENCOUNTER — Ambulatory Visit (INDEPENDENT_AMBULATORY_CARE_PROVIDER_SITE_OTHER): Payer: Medicare HMO

## 2020-02-09 DIAGNOSIS — M810 Age-related osteoporosis without current pathological fracture: Secondary | ICD-10-CM | POA: Diagnosis not present

## 2020-02-09 MED ORDER — ROMOSOZUMAB-AQQG 105 MG/1.17ML ~~LOC~~ SOSY
105.0000 mg | PREFILLED_SYRINGE | Freq: Once | SUBCUTANEOUS | Status: AC
Start: 2020-02-09 — End: 2020-02-09
  Administered 2020-02-09: 210 mg via SUBCUTANEOUS

## 2020-02-10 ENCOUNTER — Other Ambulatory Visit: Payer: Self-pay | Admitting: Internal Medicine

## 2020-02-10 DIAGNOSIS — F325 Major depressive disorder, single episode, in full remission: Secondary | ICD-10-CM

## 2020-02-16 ENCOUNTER — Telehealth: Payer: Self-pay

## 2020-02-16 NOTE — Telephone Encounter (Signed)
Heather nurse at [facility] called and wanted to know was it ok for Vanessa Mason to take 1000 Mg of Tylenol  Every 8 hours for Rib pain. She stated she want you to verify that it's ok. Please advise

## 2020-02-16 NOTE — Telephone Encounter (Signed)
Request received from [facility] for Tylenol 1000 mg every 8 hours after fall. Patient is currently using Tylenol 650 mg every morning.Patient rolled out of bed onto bed side table two days ago. Left rib pain. Not breathless, calmly talks. Some pain with movement Please advise. Routed to Dr. Mariea Clonts for approval.

## 2020-02-16 NOTE — Telephone Encounter (Signed)
Separate message from [facility] answered about the same thing.

## 2020-02-16 NOTE — Telephone Encounter (Signed)
That is fine.  That is the max dose. Why does she have rib pain?  Did she fall?

## 2020-02-17 NOTE — Telephone Encounter (Signed)
Yes, I found that later.  I'm receiving communications too many different ways so I need to get that cleared up with them.

## 2020-02-17 NOTE — Telephone Encounter (Signed)
Message given to clinic nurse who will in-turn give the message to [facility], yes apparently she fell on here night stand, Nira Conn stated she sent you a tiger-text with the information.

## 2020-03-02 DIAGNOSIS — H43813 Vitreous degeneration, bilateral: Secondary | ICD-10-CM | POA: Diagnosis not present

## 2020-03-02 DIAGNOSIS — H353112 Nonexudative age-related macular degeneration, right eye, intermediate dry stage: Secondary | ICD-10-CM | POA: Diagnosis not present

## 2020-03-02 DIAGNOSIS — H353221 Exudative age-related macular degeneration, left eye, with active choroidal neovascularization: Secondary | ICD-10-CM | POA: Diagnosis not present

## 2020-03-02 DIAGNOSIS — H35423 Microcystoid degeneration of retina, bilateral: Secondary | ICD-10-CM | POA: Diagnosis not present

## 2020-03-03 ENCOUNTER — Other Ambulatory Visit: Payer: Self-pay | Admitting: Internal Medicine

## 2020-03-03 DIAGNOSIS — I10 Essential (primary) hypertension: Secondary | ICD-10-CM

## 2020-03-03 NOTE — Telephone Encounter (Signed)
rx sent to pharmacy by e-script  

## 2020-03-12 ENCOUNTER — Other Ambulatory Visit: Payer: Self-pay

## 2020-03-12 ENCOUNTER — Ambulatory Visit (INDEPENDENT_AMBULATORY_CARE_PROVIDER_SITE_OTHER): Payer: Medicare HMO

## 2020-03-12 DIAGNOSIS — M810 Age-related osteoporosis without current pathological fracture: Secondary | ICD-10-CM | POA: Diagnosis not present

## 2020-03-12 MED ORDER — ROMOSOZUMAB-AQQG 105 MG/1.17ML ~~LOC~~ SOSY
105.0000 mg | PREFILLED_SYRINGE | Freq: Once | SUBCUTANEOUS | Status: AC
Start: 2020-03-12 — End: 2020-03-12
  Administered 2020-03-12: 210 mg via SUBCUTANEOUS

## 2020-03-19 DIAGNOSIS — C44729 Squamous cell carcinoma of skin of left lower limb, including hip: Secondary | ICD-10-CM | POA: Diagnosis not present

## 2020-03-19 DIAGNOSIS — Z85828 Personal history of other malignant neoplasm of skin: Secondary | ICD-10-CM | POA: Diagnosis not present

## 2020-03-19 DIAGNOSIS — C44519 Basal cell carcinoma of skin of other part of trunk: Secondary | ICD-10-CM | POA: Diagnosis not present

## 2020-03-19 DIAGNOSIS — L905 Scar conditions and fibrosis of skin: Secondary | ICD-10-CM | POA: Diagnosis not present

## 2020-03-19 DIAGNOSIS — D485 Neoplasm of uncertain behavior of skin: Secondary | ICD-10-CM | POA: Diagnosis not present

## 2020-04-05 DIAGNOSIS — R69 Illness, unspecified: Secondary | ICD-10-CM | POA: Diagnosis not present

## 2020-04-06 ENCOUNTER — Ambulatory Visit (INDEPENDENT_AMBULATORY_CARE_PROVIDER_SITE_OTHER): Payer: Medicare HMO | Admitting: Nurse Practitioner

## 2020-04-06 ENCOUNTER — Other Ambulatory Visit: Payer: Self-pay

## 2020-04-06 ENCOUNTER — Telehealth: Payer: Self-pay

## 2020-04-06 ENCOUNTER — Encounter: Payer: Self-pay | Admitting: Nurse Practitioner

## 2020-04-06 DIAGNOSIS — Z0000 Encounter for general adult medical examination without abnormal findings: Secondary | ICD-10-CM

## 2020-04-06 NOTE — Patient Instructions (Signed)
Vanessa Mason , Thank you for taking time to come for your Medicare Wellness Visit. I appreciate your ongoing commitment to your health goals. Please review the following plan we discussed and let me know if I can assist you in the future.   Screening recommendations/referrals: Colonoscopy aged out  Mammogram aged out Bone Density up to date Recommended yearly ophthalmology/optometry visit for glaucoma screening and checkup Recommended yearly dental visit for hygiene and checkup  Vaccinations: Influenza vaccine RECOMMENDED.  Pneumococcal vaccine up to date Tdap vaccine up to date Shingles vaccine RECOMMENDED    Advanced directives: on file.   Conditions/risks identified: advanced age.  Next appointment: 1 year.    Preventive Care 55 Years and Older, Female Preventive care refers to lifestyle choices and visits with your health care provider that can promote health and wellness. What does preventive care include?  A yearly physical exam. This is also called an annual well check.  Dental exams once or twice a year.  Routine eye exams. Ask your health care provider how often you should have your eyes checked.  Personal lifestyle choices, including:  Daily care of your teeth and gums.  Regular physical activity.  Eating a healthy diet.  Avoiding tobacco and drug use.  Limiting alcohol use.  Practicing safe sex.  Taking low-dose aspirin every day.  Taking vitamin and mineral supplements as recommended by your health care provider. What happens during an annual well check? The services and screenings done by your health care provider during your annual well check will depend on your age, overall health, lifestyle risk factors, and family history of disease. Counseling  Your health care provider may ask you questions about your:  Alcohol use.  Tobacco use.  Drug use.  Emotional well-being.  Home and relationship well-being.  Sexual activity.  Eating  habits.  History of falls.  Memory and ability to understand (cognition).  Work and work Statistician.  Reproductive health. Screening  You may have the following tests or measurements:  Height, weight, and BMI.  Blood pressure.  Lipid and cholesterol levels. These may be checked every 5 years, or more frequently if you are over 87 years old.  Skin check.  Lung cancer screening. You may have this screening every year starting at age 12 if you have a 30-pack-year history of smoking and currently smoke or have quit within the past 15 years.  Fecal occult blood test (FOBT) of the stool. You may have this test every year starting at age 74.  Flexible sigmoidoscopy or colonoscopy. You may have a sigmoidoscopy every 5 years or a colonoscopy every 10 years starting at age 16.  Hepatitis C blood test.  Hepatitis B blood test.  Sexually transmitted disease (STD) testing.  Diabetes screening. This is done by checking your blood sugar (glucose) after you have not eaten for a while (fasting). You may have this done every 1-3 years.  Bone density scan. This is done to screen for osteoporosis. You may have this done starting at age 57.  Mammogram. This may be done every 1-2 years. Talk to your health care provider about how often you should have regular mammograms. Talk with your health care provider about your test results, treatment options, and if necessary, the need for more tests. Vaccines  Your health care provider may recommend certain vaccines, such as:  Influenza vaccine. This is recommended every year.  Tetanus, diphtheria, and acellular pertussis (Tdap, Td) vaccine. You may need a Td booster every 10 years.  Zoster  vaccine. You may need this after age 58.  Pneumococcal 13-valent conjugate (PCV13) vaccine. One dose is recommended after age 11.  Pneumococcal polysaccharide (PPSV23) vaccine. One dose is recommended after age 58. Talk to your health care provider about which  screenings and vaccines you need and how often you need them. This information is not intended to replace advice given to you by your health care provider. Make sure you discuss any questions you have with your health care provider. Document Released: 06/11/2015 Document Revised: 02/02/2016 Document Reviewed: 03/16/2015 Elsevier Interactive Patient Education  2017 [place] Prevention in the Home Falls can cause injuries. They can happen to people of all ages. There are many things you can do to make your home safe and to help prevent falls. What can I do on the outside of my home?  Regularly fix the edges of walkways and driveways and fix any cracks.  Remove anything that might make you trip as you walk through a door, such as a raised step or threshold.  Trim any bushes or trees on the path to your home.  Use bright outdoor lighting.  Clear any walking paths of anything that might make someone trip, such as rocks or tools.  Regularly check to see if handrails are loose or broken. Make sure that both sides of any steps have handrails.  Any raised decks and porches should have guardrails on the edges.  Have any leaves, snow, or ice cleared regularly.  Use sand or salt on walking paths during winter.  Clean up any spills in your garage right away. This includes oil or grease spills. What can I do in the bathroom?  Use night lights.  Install grab bars by the toilet and in the tub and shower. Do not use towel bars as grab bars.  Use non-skid mats or decals in the tub or shower.  If you need to sit down in the shower, use a plastic, non-slip stool.  Keep the floor dry. Clean up any water that spills on the floor as soon as it happens.  Remove soap buildup in the tub or shower regularly.  Attach bath mats securely with double-sided non-slip rug tape.  Do not have throw rugs and other things on the floor that can make you trip. What can I do in the bedroom?  Use  night lights.  Make sure that you have a light by your bed that is easy to reach.  Do not use any sheets or blankets that are too big for your bed. They should not hang down onto the floor.  Have a firm chair that has side arms. You can use this for support while you get dressed.  Do not have throw rugs and other things on the floor that can make you trip. What can I do in the kitchen?  Clean up any spills right away.  Avoid walking on wet floors.  Keep items that you use a lot in easy-to-reach places.  If you need to reach something above you, use a strong step stool that has a grab bar.  Keep electrical cords out of the way.  Do not use floor polish or wax that makes floors slippery. If you must use wax, use non-skid floor wax.  Do not have throw rugs and other things on the floor that can make you trip. What can I do with my stairs?  Do not leave any items on the stairs.  Make sure that there are handrails  on both sides of the stairs and use them. Fix handrails that are broken or loose. Make sure that handrails are as long as the stairways.  Check any carpeting to make sure that it is firmly attached to the stairs. Fix any carpet that is loose or worn.  Avoid having throw rugs at the top or bottom of the stairs. If you do have throw rugs, attach them to the floor with carpet tape.  Make sure that you have a light switch at the top of the stairs and the bottom of the stairs. If you do not have them, ask someone to add them for you. What else can I do to help prevent falls?  Wear shoes that:  Do not have high heels.  Have rubber bottoms.  Are comfortable and fit you well.  Are closed at the toe. Do not wear sandals.  If you use a stepladder:  Make sure that it is fully opened. Do not climb a closed stepladder.  Make sure that both sides of the stepladder are locked into place.  Ask someone to hold it for you, if possible.  Clearly mark and make sure that you  can see:  Any grab bars or handrails.  First and last steps.  Where the edge of each step is.  Use tools that help you move around (mobility aids) if they are needed. These include:  Canes.  Walkers.  Scooters.  Crutches.  Turn on the lights when you go into a dark area. Replace any light bulbs as soon as they burn out.  Set up your furniture so you have a clear path. Avoid moving your furniture around.  If any of your floors are uneven, fix them.  If there are any pets around you, be aware of where they are.  Review your medicines with your doctor. Some medicines can make you feel dizzy. This can increase your chance of falling. Ask your doctor what other things that you can do to help prevent falls. This information is not intended to replace advice given to you by your health care provider. Make sure you discuss any questions you have with your health care provider. Document Released: 03/11/2009 Document Revised: 10/21/2015 Document Reviewed: 06/19/2014 Elsevier Interactive Patient Education  2017 [employer].

## 2020-04-06 NOTE — Progress Notes (Signed)
This service is provided via telemedicine  No vital signs collected/recorded due to the encounter was a telemedicine visit.   Location of patient (ex: home, work):  Home  Patient consents to a telephone visit:Yes, see encounter dated 04/06/2020  Location of the provider (ex: office, home): [place]  Name of any referring provider:  Hollace Kinnier, DO  Names of all persons participating in the telemedicine service and their role in the encounter:  Sherrie Mustache, Nurse Practitioner, Carroll Kinds, CMA, and patient.   Time spent on call:  15 minutes with medical assistant

## 2020-04-06 NOTE — Telephone Encounter (Signed)
[name], Vanessa Mason are scheduled for a virtual visit with your provider today.    Just as we do with appointments in the office, we must obtain your consent to participate.  Your consent will be active for this visit and any virtual visit you may have with one of our providers in the next 365 days.    If you have a MyChart account, I can also send a copy of this consent to you electronically.  All virtual visits are billed to your insurance company just like a traditional visit in the office.  As this is a virtual visit, video technology does not allow for your provider to perform a traditional examination.  This may limit your provider's ability to fully assess your condition.  If your provider identifies any concerns that need to be evaluated in person or the need to arrange testing such as labs, EKG, etc, we will make arrangements to do so.    Although advances in technology are sophisticated, we cannot ensure that it will always work on either your end or our end.  If the connection with a video visit is poor, we may have to switch to a telephone visit.  With either a video or telephone visit, we are not always able to ensure that we have a secure connection.   I need to obtain your verbal consent now.   Are you willing to proceed with your visit today?   Vanessa Mason has provided verbal consent on 04/06/2020 for a virtual visit (video or telephone).   Carroll Kinds, CMA 04/06/2020  9:56 AM

## 2020-04-06 NOTE — Progress Notes (Signed)
Subjective:   Vanessa Mason is a 86 y.o. female who presents for Medicare Annual (Subsequent) preventive examination.  Review of Systems     Cardiac Risk Factors include: advanced age (>25men, >33 women);hypertension     Objective:    There were no vitals filed for this visit. There is no height or weight on file to calculate BMI.  Advanced Directives 04/06/2020 01/28/2020 12/03/2019 10/08/2019 08/06/2019 04/03/2019 04/03/2019  Does Patient Have a Medical Advance Directive? Yes - Yes Yes Yes Yes Yes  Type of Paramedic of [place];Out of facility DNR (pink MOST or yellow form) [place];Living will [place];Living will;Out of facility DNR (pink MOST or yellow form) Out of facility DNR (pink MOST or yellow form) Out of facility DNR (pink MOST or yellow form) [place];Out of facility DNR (pink MOST or yellow form);Living will [place];Out of facility DNR (pink MOST or yellow form);Living will  Does patient want to make changes to medical advance directive? No - Patient declined No - Guardian declined No - Patient declined No - Patient declined No - Patient declined No - Patient declined No - Patient declined  Copy of [place] in Chart? Yes - validated most recent copy scanned in chart (See row information) Yes - validated most recent copy scanned in chart (See row information) Yes - validated most recent copy scanned in chart (See row information) - - Yes - validated most recent copy scanned in chart (See row information) Yes - validated most recent copy scanned in chart (See row information)  Pre-existing out of facility DNR order (yellow form or pink MOST form) Pink MOST form placed in chart (order not valid for inpatient use) - Yellow form placed in chart (order not valid for inpatient use) - - - -    Current Medications (verified) Outpatient Encounter Medications as  of 04/06/2020  Medication Sig  . acetaminophen (TYLENOL) 650 MG CR tablet Take 1 tablet (650 mg total) by mouth as needed (for pain).  . Ascorbic Acid (VITAMIN C) 1000 MG tablet Take 1,000 mg by mouth daily.  . Cholecalciferol (VITAMIN D3) 50 MCG (2000 UT) TABS Take 1 tablet by mouth daily.  . DULoxetine (CYMBALTA) 20 MG capsule Take 1 capsule (20 mg total) by mouth daily.  Marland Kitchen losartan (COZAAR) 50 MG tablet TAKE 1 TABLET DAILY  . Multiple Vitamins-Minerals (PRESERVISION AREDS 2+MULTI VIT PO) Take by mouth.  . Romosozumab-aqqg (EVENITY) 105 MG/1.17ML SOSY injection Inject 210 mg into the skin once.  Marland Kitchen UNABLE TO FIND Life extension multi vitamin 2 daily   No facility-administered encounter medications on file as of 04/06/2020.    Allergies (verified) Naproxen, Nsaids, Aspirin, Ace inhibitors, and Caffeine   History: Past Medical History:  Diagnosis Date  . Arthritis    "all over"  . BACK PAIN 03/08/2007  . 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 DEPRESSIVE DISORDER 12/01/2008  . Duodenal ulcer   . GI bleed 09/12/2010   Naproxen induced Endoscopy with dr Harlene Ramus   . HIATAL HERNIA 12/17/2006  . History of hiatal hernia   . HYPERLIPIDEMIA 03/09/2006  . HYPERTENSION 03/09/2006  . KNEE PAIN 05/06/2009  . Macular degeneration   . Pneumonia    70yrs. ago- hosp. pneumonia  . Urinary urgency    Past Surgical History:  Procedure Laterality Date  . ABDOMINAL HYSTERECTOMY  1975  . APPLICATION OF  A-CELL OF EXTREMITY Right 12/10/2014   Procedure: APPLICATION OF A-CELL OF EXTREMITY;  Surgeon: Theodoro Kos, DO;  Location: [facility];  Service: Plastics;  Laterality: Right;  . arthroscopic knee Right    x 2  . BACK SURGERY  2000   spinal fusion  . BILATERAL SALPINGECTOMY Bilateral 1989   Dr. Marianna Fuss  . CARDIAC CATHETERIZATION    . EYE SURGERY     cataracts bilateral - removed  . HERNIA REPAIR Bilateral    inguinal   . I & D EXTREMITY Right 12/10/2014   Procedure: IRRIGATION  AND DEBRIDEMENT ON RIGHT LEG, ;  Surgeon: Theodoro Kos, DO;  Location: [facility];  Service: Plastics;  Laterality: Right;  . I & D EXTREMITY Right 01/20/2015   Procedure: IRRIGATION AND DEBRIDEMENT RIGHT LOWER LEG WOUND, with ACELL to Donor Site, SKIN GRAFT AND VAC Placement;  Surgeon: Theodoro Kos, DO;  Location: [place] [facility];  Service: Plastics;  Laterality: Right;  . LUMBAR LAMINECTOMY  02-17-1999   with sinal fusion L3,4,5,&S1  . moes     moses surgery on R lle  . OOPHORECTOMY  1989  . RESECTION DISTAL CLAVICAL    . ROTATOR CUFF REPAIR Right 2001   x2  . SKIN GRAFT Right 05/29/2018  . THYROIDECTOMY    . TONSILLECTOMY  1928  . TUBAL LIGATION     Family History  Problem Relation Age of Onset  . Cancer Mother 40  . Cancer Brother    Social History   Socioeconomic History  . Marital status: Widowed    Spouse name: Not on file  . Number of children: 5  . Years of education: Not on file  . Highest education level: Not on file  Occupational History  . Not on file  Tobacco Use  . Smoking status: Former Smoker    Years: 16.00  . Smokeless tobacco: Never Used  . Tobacco comment: Quit at age 24  Vaping Use  . Vaping Use: Never used  Substance and Sexual Activity  . Alcohol use: Yes    Alcohol/week: 7.0 standard drinks    Types: 7 Glasses of wine per week    Comment: wine occasionally @ dinner  . Drug use: No  . Sexual activity: Not on file  Other Topics Concern  . Not on file  Social History Narrative   Lives at [facility] since 09/1992   Widow   Never smoked   Alcohol wine at night   Exercise classes 3-5 times a week    POA, DNR, Living Will   Social Determinants of Health   Financial Resource Strain:   . Difficulty of Paying Living Expenses: Not on file  Food Insecurity:   . Worried About Charity fundraiser in the Last Year: Not on file  . Ran Out of Food in the Last Year: Not on file  Transportation Needs:   . Lack of Transportation (Medical): Not on file  . Lack of  Transportation (Non-Medical): Not on file  Physical Activity:   . Days of Exercise per Week: Not on file  . Minutes of Exercise per Session: Not on file  Stress:   . Feeling of Stress : Not on file  Social Connections:   . Frequency of Communication with Friends and Family: Not on file  . Frequency of Social Gatherings with Friends and Family: Not on file  . Attends Religious Services: Not on file  . Active Member of Clubs or Organizations: Not on file  . Attends Club or  Organization Meetings: Not on file  . Marital Status: Not on file    Tobacco Counseling Counseling given: Not Answered Comment: Quit at age 54   Clinical Intake:  Pre-visit preparation completed: Yes  Pain : No/denies pain     BMI - recorded: 23 Nutritional Status: BMI of 19-24  Normal Nutritional Risks: None Diabetes: No  How often do you need to have someone help you when you read instructions, pamphlets, or other written materials from your doctor or pharmacy?: 1 - Never  Diabetic?no         Activities of Daily Living In your present state of health, do you have any difficulty performing the following activities: 04/06/2020  Hearing? Y  Vision? Y  Difficulty concentrating or making decisions? Y  Walking or climbing stairs? Y  Dressing or bathing? N  Doing errands, shopping? N  Preparing Food and eating ? N  Using the Toilet? N  In the past six months, have you accidently leaked urine? Y  Do you have problems with loss of bowel control? N  Managing your Medications? N  Managing your Finances? N  Housekeeping or managing your Housekeeping? N  Some recent data might be hidden    Patient Care Team: Gayland Curry, DO as PCP - General (Geriatric Medicine) Ronald Lobo, MD as Consulting Physician (Gastroenterology) Vickey Huger, MD as Consulting Physician (Orthopedic Surgery) Izora Ribas as Consulting Physician (Dermatology) Darlin Coco, MD as Consulting Physician  (Cardiology) Luberta Mutter, MD as Consulting Physician (Ophthalmology) Karin Golden, MD as Referring Physician (Dermatology)  Indicate any recent Medical Services you may have received from other than [facility] providers in the past year (date may be approximate).     Assessment:   This is a routine wellness examination for [place].  Hearing/Vision screen  Hearing Screening   125Hz  250Hz  500Hz  1000Hz  2000Hz  3000Hz  4000Hz  6000Hz  8000Hz   Right ear:           Left ear:           Comments: Patient states she wears hearing aids  Vision Screening Comments: Patient has problems with vision she has macular degenreation  Dietary issues and exercise activities discussed: Current Exercise Habits: The patient does not participate in regular exercise at present  Goals    . Patient Stated     Maintain current level of health      Depression Screen PHQ 2/9 Scores 04/06/2020 08/06/2019 04/03/2019 12/25/2018 08/21/2018 04/03/2018 07/17/2017  PHQ - 2 Score 0 0 0 0 0 0 0    Fall Risk Fall Risk  04/06/2020 01/28/2020 10/08/2019 08/06/2019 04/09/2019  Falls in the past year? 1 0 0 0 0  Number falls in past yr: 1 0 0 0 -  Injury with Fall? 0 0 0 0 -  Comment - - - - -    Any stairs in or around the home? No  If so, are there any without handrails? No  Home free of loose throw rugs in walkways, pet beds, electrical cords, etc? Yes  Adequate lighting in your home to reduce risk of falls? Yes   ASSISTIVE DEVICES UTILIZED TO PREVENT FALLS:  Life alert? Yes  Use of a cane, walker or w/c? Yes  Grab bars in the bathroom? Yes  Shower chair or bench in shower? Yes  Elevated toilet seat or a handicapped toilet? Yes   TIMED UP AND GO:  Was the test performed? No .    Cognitive Function: MMSE - Mini Mental State Exam 07/17/2017 06/07/2016  Orientation to time 5 5  Orientation to Place 5 5  Registration 3 3  Attention/ Calculation 5 5  Recall 2 1  Language- name 2 objects 2 2  Language- repeat 1 1   Language- follow 3 step command 3 3  Language- read & follow direction 1 1  Write a sentence 1 1  Copy design 1 1  Total score 29 28     6CIT Screen 04/06/2020 04/03/2019  What Year? 0 points 0 points  What month? 0 points 0 points  What time? 0 points 0 points  Count back from 20 0 points 0 points  Months in reverse 0 points 0 points  Repeat phrase 2 points 2 points  Total Score 2 2    Immunizations Immunization History  Administered Date(s) Administered  . Influenza Split 03/08/2011, 03/05/2012  . Influenza Whole 03/08/2007, 03/02/2009, 03/29/2010  . Influenza, High Dose Seasonal PF 03/21/2013, 06/13/2017, 03/07/2019  . Influenza,inj,Quad PF,6+ Mos 02/20/2014, 02/11/2015, 03/22/2018  . Influenza-Unspecified 03/23/2016, 04/05/2020  . Moderna SARS-COVID-2 Vaccination 06/10/2019, 07/09/2019  . PFIZER SARS-COV-2 Vaccination 04/05/2020  . Pneumococcal Conjugate-13 07/30/2014  . Pneumococcal Polysaccharide-23 02/03/2002, 12/16/2009  . Td 02/03/2002  . Tdap 12/11/2012    TDAP status: Up to date Flu shot recommended at this time. Pneumococcal vaccine status: Up to date Covid-19 vaccine status: Completed vaccines  Qualifies for Shingles Vaccine? yes  Zostavax completed No   Shingrix Completed?: No.    Education has been provided regarding the importance of this vaccine. Patient has been advised to call insurance company to determine out of pocket expense if they have not yet received this vaccine. Advised may also receive vaccine at local pharmacy or Health Dept. Verbalized acceptance and understanding.  Screening Tests Health Maintenance  Topic Date Due  . TETANUS/TDAP  12/12/2022  . INFLUENZA VACCINE  Completed  . DEXA SCAN  Completed  . COVID-19 Vaccine  Completed  . PNA vac Low Risk Adult  Completed    Health Maintenance  There are no preventive care reminders to display for this patient.  Colorectal cancer screening: No longer required.  Mammogram status: No  longer required.  Bone Density status: Completed 03/2019. Results reflect: Bone density results: OSTEOPOROSIS. Repeat every 2 years.  Lung Cancer Screening: (Low Dose CT Chest recommended if Age 32-80 years, 30 pack-year currently smoking OR have quit w/in 15years.) does not qualify.   Lung Cancer Screening Referral: na  Additional Screening:  Hepatitis C Screening: does not qualify; Completed na  Vision Screening: Recommended annual ophthalmology exams for early detection of glaucoma and other disorders of the eye. Is the patient up to date with their annual eye exam?  Yes  Who is the provider or what is the name of the office in which the patient attends annual eye exams? [facility] If pt is not established with a provider, would they like to be referred to a provider to establish care? No .   Dental Screening: Recommended annual dental exams for proper oral hygiene  Community Resource Referral / Chronic Care Management: CRR required this visit?  No   CCM required this visit?  No      Plan:     I have personally reviewed and noted the following in the patient's chart:   . Medical and social history . Use of alcohol, tobacco or illicit drugs  . Current medications and supplements . Functional ability and status . Nutritional status . Physical activity . Advanced directives . List of other physicians . Hospitalizations,  surgeries, and ER visits in previous 12 months . Vitals . Screenings to include cognitive, depression, and falls . Referrals and appointments  In addition, I have reviewed and discussed with patient certain preventive protocols, quality metrics, and best practice recommendations. A written personalized care plan for preventive services as well as general preventive health recommendations were provided to patient.     Lauree Chandler, NP   04/06/2020    Virtual Visit via Telephone Note  I connected with@ on 04/06/20 at 10:00 AM EST by telephone and  verified that I am speaking with the correct person using two identifiers.  Location: Patient: home Provider: [facility]   I discussed the limitations, risks, security and privacy concerns of performing an evaluation and management service by telephone and the availability of in person appointments. I also discussed with the patient that there may be a patient responsible charge related to this service. The patient expressed understanding and agreed to proceed.   I discussed the assessment and treatment plan with the patient. The patient was provided an opportunity to ask questions and all were answered. The patient agreed with the plan and demonstrated an understanding of the instructions.   The patient was advised to call back or seek an in-person evaluation if the symptoms worsen or if the condition fails to improve as anticipated.  I provided 20 minutes of non-face-to-face time during this encounter.  Carlos American. Harle Battiest Avs printed and mailed

## 2020-04-08 ENCOUNTER — Encounter: Payer: Self-pay | Admitting: Internal Medicine

## 2020-04-08 DIAGNOSIS — C44729 Squamous cell carcinoma of skin of left lower limb, including hip: Secondary | ICD-10-CM | POA: Diagnosis not present

## 2020-04-09 DIAGNOSIS — H353112 Nonexudative age-related macular degeneration, right eye, intermediate dry stage: Secondary | ICD-10-CM | POA: Diagnosis not present

## 2020-04-09 DIAGNOSIS — H353221 Exudative age-related macular degeneration, left eye, with active choroidal neovascularization: Secondary | ICD-10-CM | POA: Diagnosis not present

## 2020-04-14 ENCOUNTER — Ambulatory Visit: Payer: Medicare HMO

## 2020-04-14 ENCOUNTER — Other Ambulatory Visit: Payer: Self-pay | Admitting: Internal Medicine

## 2020-04-14 DIAGNOSIS — F325 Major depressive disorder, single episode, in full remission: Secondary | ICD-10-CM

## 2020-04-27 ENCOUNTER — Ambulatory Visit (INDEPENDENT_AMBULATORY_CARE_PROVIDER_SITE_OTHER): Payer: Medicare HMO

## 2020-04-27 ENCOUNTER — Other Ambulatory Visit: Payer: Self-pay

## 2020-04-27 DIAGNOSIS — M810 Age-related osteoporosis without current pathological fracture: Secondary | ICD-10-CM

## 2020-04-27 MED ORDER — ROMOSOZUMAB-AQQG 105 MG/1.17ML ~~LOC~~ SOSY
210.0000 mg | PREFILLED_SYRINGE | Freq: Once | SUBCUTANEOUS | Status: AC
  Administered 2020-04-27: 210 mg via SUBCUTANEOUS

## 2020-04-28 DIAGNOSIS — R69 Illness, unspecified: Secondary | ICD-10-CM | POA: Diagnosis not present

## 2020-04-29 ENCOUNTER — Encounter: Payer: Self-pay | Admitting: Internal Medicine

## 2020-04-29 DIAGNOSIS — C44511 Basal cell carcinoma of skin of breast: Secondary | ICD-10-CM | POA: Diagnosis not present

## 2020-04-29 DIAGNOSIS — L989 Disorder of the skin and subcutaneous tissue, unspecified: Secondary | ICD-10-CM | POA: Diagnosis not present

## 2020-04-29 DIAGNOSIS — D485 Neoplasm of uncertain behavior of skin: Secondary | ICD-10-CM | POA: Diagnosis not present

## 2020-05-11 ENCOUNTER — Telehealth: Payer: Self-pay | Admitting: Internal Medicine

## 2020-05-11 NOTE — Telephone Encounter (Signed)
Leigh Aurora C, CMA  You 5 minutes ago (11:13 AM)     I am routing message to [facility] Clinical Intake to further follow-up   Routing comment

## 2020-05-11 NOTE — Telephone Encounter (Signed)
Patient notified and agreed.  Scheduled an appointment at [facility] for 05/19/2020 at 1:30 for her Left Hip pain.

## 2020-05-11 NOTE — Telephone Encounter (Signed)
Patient called and would like to see Dr. Mariea Clonts at [facility] clinic on 05/12/20. Patient is having trouble with her hip and feels that she needs to be seen.  She is afraid that she will fall because her hip keeps giving away.  There were no available appointments on Dr. Cyndi Lennert schedule and patient did not want to come to [facility] to be seen.  Thank you  Adan Sis

## 2020-05-11 NOTE — Telephone Encounter (Signed)
I'd like to suggest a PT evaluation at [place] for her hip through [facility].  I'll be there later today and can write that order.  Maybe there's an appt next Wednesday to see me there?

## 2020-05-14 DIAGNOSIS — L923 Foreign body granuloma of the skin and subcutaneous tissue: Secondary | ICD-10-CM | POA: Diagnosis not present

## 2020-05-19 ENCOUNTER — Other Ambulatory Visit: Payer: Self-pay

## 2020-05-19 ENCOUNTER — Non-Acute Institutional Stay: Payer: Medicare HMO | Admitting: Internal Medicine

## 2020-05-19 ENCOUNTER — Encounter: Payer: Self-pay | Admitting: Internal Medicine

## 2020-05-19 VITALS — BP 122/78 | HR 62 | Temp 97.7°F | Ht 60.0 in | Wt 120.0 lb

## 2020-05-19 DIAGNOSIS — M8949 Other hypertrophic osteoarthropathy, multiple sites: Secondary | ICD-10-CM | POA: Diagnosis not present

## 2020-05-19 DIAGNOSIS — M159 Polyosteoarthritis, unspecified: Secondary | ICD-10-CM

## 2020-05-19 DIAGNOSIS — G3184 Mild cognitive impairment, so stated: Secondary | ICD-10-CM

## 2020-05-19 DIAGNOSIS — M7062 Trochanteric bursitis, left hip: Secondary | ICD-10-CM | POA: Diagnosis not present

## 2020-05-19 NOTE — Progress Notes (Signed)
Location:  [place] of Service:  Clinic (12)  Provider: Louise Rawson L. Mariea Clonts, D.O., C.M.D.  Code Status: DNR Goals of Care:  Advanced Directives 05/19/2020  Does Patient Have a Medical Advance Directive? Yes  Type of Paramedic of [place];Out of facility DNR (pink MOST or yellow form)  Does patient want to make changes to medical advance directive? No - Patient declined  Copy of [place] in Chart? Yes - validated most recent copy scanned in chart (See row information)  Pre-existing out of facility DNR order (yellow form or pink MOST form) Pink MOST form placed in chart (order not valid for inpatient use)     Chief Complaint  Patient presents with  . Acute Visit    Left hip pain     HPI: Patient is a 86 y.o. female seen today for an acute visit form left hip pain.   She is doing better on cymbalta vs celexa. Tells me about intolerance to caffeine causing pain in neck and shoulders.   Stopped the diet coke and it went away.   Still has spurts of left hip pain when walking.  Shoots in and then dull pain persists.    Not into groin, not in back.    Past Medical History:  Diagnosis Date  . Arthritis    "all over"  . BACK PAIN 03/08/2007  . 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 DEPRESSIVE DISORDER 12/01/2008  . Duodenal ulcer   . GI bleed 09/12/2010   Naproxen induced Endoscopy with dr Harlene Ramus   . HIATAL HERNIA 12/17/2006  . History of hiatal hernia   . HYPERLIPIDEMIA 03/09/2006  . HYPERTENSION 03/09/2006  . KNEE PAIN 05/06/2009  . Macular degeneration   . Pneumonia    67yrs. ago- hosp. pneumonia  . Urinary urgency     Past Surgical History:  Procedure Laterality Date  . ABDOMINAL HYSTERECTOMY  1975  . APPLICATION OF A-CELL OF EXTREMITY Right 12/10/2014   Procedure: APPLICATION OF A-CELL OF EXTREMITY;  Surgeon: Theodoro Kos, DO;  Location: [facility];  Service: Plastics;  Laterality: Right;  .  arthroscopic knee Right    x 2  . BACK SURGERY  2000   spinal fusion  . BILATERAL SALPINGECTOMY Bilateral 1989   Dr. Marianna Fuss  . CARDIAC CATHETERIZATION    . EYE SURGERY     cataracts bilateral - removed  . HERNIA REPAIR Bilateral    inguinal   . I & D EXTREMITY Right 12/10/2014   Procedure: IRRIGATION AND DEBRIDEMENT ON RIGHT LEG, ;  Surgeon: Theodoro Kos, DO;  Location: [facility];  Service: Plastics;  Laterality: Right;  . I & D EXTREMITY Right 01/20/2015   Procedure: IRRIGATION AND DEBRIDEMENT RIGHT LOWER LEG WOUND, with ACELL to Donor Site, SKIN GRAFT AND VAC Placement;  Surgeon: Theodoro Kos, DO;  Location: [facility];  Service: Plastics;  Laterality: Right;  . LUMBAR LAMINECTOMY  02-17-1999   with sinal fusion L3,4,5,&S1  . moes     moses surgery on R lle  . OOPHORECTOMY  1989  . RESECTION DISTAL CLAVICAL    . ROTATOR CUFF REPAIR Right 2001   x2  . SKIN GRAFT Right 05/29/2018  . THYROIDECTOMY    . TONSILLECTOMY  1928  . TUBAL LIGATION      Allergies  Allergen Reactions  . Naproxen     Gi bleed  . Nsaids     GI  BLEED  . Aspirin     GI bleed  . Ace Inhibitors     Cough   . Caffeine Other (See Comments)    Causes joints to be painful    Outpatient Encounter Medications as of 05/19/2020  Medication Sig  . acetaminophen (TYLENOL) 650 MG CR tablet Take 1 tablet (650 mg total) by mouth as needed (for pain).  . Ascorbic Acid (VITAMIN C) 1000 MG tablet Take 1,000 mg by mouth daily.  . Cholecalciferol (VITAMIN D3) 50 MCG (2000 UT) TABS Take 1 tablet by mouth daily.  . DULoxetine (CYMBALTA) 20 MG capsule TAKE 1 CAPSULE DAILY  . losartan (COZAAR) 50 MG tablet TAKE 1 TABLET DAILY  . Multiple Vitamins-Minerals (PRESERVISION AREDS 2+MULTI VIT PO) Take by mouth.  . Romosozumab-aqqg (EVENITY) 105 MG/1.17ML SOSY injection Inject 210 mg into the skin once.  Marland Kitchen UNABLE TO FIND Life extension multi vitamin 2 daily   No facility-administered encounter medications on file as of 05/19/2020.     Review of Systems:  Review of Systems  Constitutional: Negative for chills, fever and malaise/fatigue.  HENT: Negative for congestion and sore throat.   Eyes: Negative for blurred vision.  Respiratory: Negative for cough and shortness of breath.   Cardiovascular: Negative for chest pain, palpitations and leg swelling.  Gastrointestinal: Negative for abdominal pain, blood in stool, constipation, diarrhea and melena.  Genitourinary: Negative for dysuria.  Musculoskeletal: Positive for joint pain, myalgias and neck pain. Negative for falls.  Skin: Negative for itching and rash.  Neurological: Negative for dizziness and loss of consciousness.  Psychiatric/Behavioral: Positive for memory loss. Negative for depression. The patient is not nervous/anxious and does not have insomnia.     Health Maintenance  Topic Date Due  . COVID-19 Vaccine (4 - Booster) 10/03/2020  . TETANUS/TDAP  12/12/2022  . INFLUENZA VACCINE  Completed  . DEXA SCAN  Completed  . PNA vac Low Risk Adult  Completed    Physical Exam: Vitals:   05/19/20 1342  BP: 122/78  Pulse: 62  Temp: 97.7 F (36.5 C)  SpO2: 98%  Weight: 120 lb (54.4 kg)  Height: 5' (1.524 m)   Body mass index is 23.44 kg/m. Physical Exam  Labs reviewed: Basic Metabolic Panel: Recent Labs    10/09/19 0300 12/02/19 0000  NA 140 141  K 4.3 4.1  CL 103 103  CO2 26* 26*  BUN 22* 23*  CREATININE 0.6 0.6  CALCIUM 9.7 9.2   Liver Function Tests: Recent Labs    12/02/19 0000  AST 21  ALT 13  ALKPHOS 68   No results for input(s): LIPASE, AMYLASE in the last 8760 hours. No results for input(s): AMMONIA in the last 8760 hours. CBC: Recent Labs    10/09/19 0300 12/02/19 0000  WBC 5.9 5.8  HGB 12.2 12.0  HCT 37 36  PLT 182 179   Lipid Panel: No results for input(s): CHOL, HDL, LDLCALC, TRIG, CHOLHDL, LDLDIRECT in the last 8760 hours. No results found for: HGBA1C  Procedures since last visit: No results  found.  Assessment/Plan 1. Greater trochanteric bursitis of left hip -suspect - will obtain imaging to be sure it's not end stage DJD  - DG Hip Unilat W OR W/O Pelvis Min 4 Views Left; Future - if not arthritis, will plan steroid injection in [facility]   2. Mild cognitive impairment with memory loss -progressing gradually -Pregnenolone test? Per her daughter -Fish oil 2000mg  daily  3. Primary osteoarthritis involving multiple joints -cont cymbalta, may  use tylenol, voltaren gel  Labs/tests ordered:  Left hip/pelvis xrays  Next appt:  05/31/2020  Flannery Cavallero L. Taheem Fricke, D.O. [facility] [address], [place] [address] Cell Phone (Mon-Fri 8am-5pm):  [phone] On Call:  [phone] & follow prompts after 5pm & weekends Office Phone:  [phone] Office Fax:  [phone]

## 2020-05-31 ENCOUNTER — Ambulatory Visit: Payer: Medicare HMO

## 2020-06-02 ENCOUNTER — Other Ambulatory Visit: Payer: Self-pay

## 2020-06-02 ENCOUNTER — Encounter: Payer: Self-pay | Admitting: Internal Medicine

## 2020-06-02 ENCOUNTER — Ambulatory Visit (INDEPENDENT_AMBULATORY_CARE_PROVIDER_SITE_OTHER): Payer: Medicare HMO

## 2020-06-02 DIAGNOSIS — M810 Age-related osteoporosis without current pathological fracture: Secondary | ICD-10-CM | POA: Diagnosis not present

## 2020-06-02 MED ORDER — ROMOSOZUMAB-AQQG 105 MG/1.17ML ~~LOC~~ SOSY
210.0000 mg | PREFILLED_SYRINGE | Freq: Once | SUBCUTANEOUS | Status: AC
  Administered 2020-06-02: 210 mg via SUBCUTANEOUS

## 2020-06-03 DIAGNOSIS — C44729 Squamous cell carcinoma of skin of left lower limb, including hip: Secondary | ICD-10-CM | POA: Diagnosis not present

## 2020-06-04 DIAGNOSIS — H353112 Nonexudative age-related macular degeneration, right eye, intermediate dry stage: Secondary | ICD-10-CM | POA: Diagnosis not present

## 2020-06-04 DIAGNOSIS — H353221 Exudative age-related macular degeneration, left eye, with active choroidal neovascularization: Secondary | ICD-10-CM | POA: Diagnosis not present

## 2020-06-11 DIAGNOSIS — I10 Essential (primary) hypertension: Secondary | ICD-10-CM | POA: Diagnosis not present

## 2020-06-11 DIAGNOSIS — M1990 Unspecified osteoarthritis, unspecified site: Secondary | ICD-10-CM | POA: Diagnosis not present

## 2020-06-11 DIAGNOSIS — M810 Age-related osteoporosis without current pathological fracture: Secondary | ICD-10-CM | POA: Diagnosis not present

## 2020-06-11 DIAGNOSIS — J439 Emphysema, unspecified: Secondary | ICD-10-CM | POA: Diagnosis not present

## 2020-06-11 DIAGNOSIS — K5900 Constipation, unspecified: Secondary | ICD-10-CM | POA: Diagnosis not present

## 2020-06-11 DIAGNOSIS — Z809 Family history of malignant neoplasm, unspecified: Secondary | ICD-10-CM | POA: Diagnosis not present

## 2020-06-11 DIAGNOSIS — Z008 Encounter for other general examination: Secondary | ICD-10-CM | POA: Diagnosis not present

## 2020-06-11 DIAGNOSIS — N3946 Mixed incontinence: Secondary | ICD-10-CM | POA: Diagnosis not present

## 2020-06-11 DIAGNOSIS — R69 Illness, unspecified: Secondary | ICD-10-CM | POA: Diagnosis not present

## 2020-06-11 DIAGNOSIS — I739 Peripheral vascular disease, unspecified: Secondary | ICD-10-CM | POA: Diagnosis not present

## 2020-06-11 DIAGNOSIS — L97901 Non-pressure chronic ulcer of unspecified part of unspecified lower leg limited to breakdown of skin: Secondary | ICD-10-CM | POA: Diagnosis not present

## 2020-06-18 DIAGNOSIS — D485 Neoplasm of uncertain behavior of skin: Secondary | ICD-10-CM | POA: Diagnosis not present

## 2020-06-18 DIAGNOSIS — C44729 Squamous cell carcinoma of skin of left lower limb, including hip: Secondary | ICD-10-CM | POA: Diagnosis not present

## 2020-06-28 DIAGNOSIS — C44729 Squamous cell carcinoma of skin of left lower limb, including hip: Secondary | ICD-10-CM | POA: Diagnosis not present

## 2020-07-05 ENCOUNTER — Ambulatory Visit (INDEPENDENT_AMBULATORY_CARE_PROVIDER_SITE_OTHER): Payer: Medicare HMO

## 2020-07-05 ENCOUNTER — Other Ambulatory Visit: Payer: Self-pay

## 2020-07-05 DIAGNOSIS — M810 Age-related osteoporosis without current pathological fracture: Secondary | ICD-10-CM

## 2020-07-05 MED ORDER — ROMOSOZUMAB-AQQG 105 MG/1.17ML ~~LOC~~ SOSY
105.0000 mg | PREFILLED_SYRINGE | Freq: Once | SUBCUTANEOUS | Status: AC
  Administered 2020-07-05: 210 mg via SUBCUTANEOUS

## 2020-07-06 DIAGNOSIS — C44729 Squamous cell carcinoma of skin of left lower limb, including hip: Secondary | ICD-10-CM | POA: Diagnosis not present

## 2020-07-13 ENCOUNTER — Other Ambulatory Visit: Payer: Self-pay | Admitting: *Deleted

## 2020-07-13 DIAGNOSIS — D485 Neoplasm of uncertain behavior of skin: Secondary | ICD-10-CM | POA: Diagnosis not present

## 2020-07-13 DIAGNOSIS — F325 Major depressive disorder, single episode, in full remission: Secondary | ICD-10-CM

## 2020-07-13 DIAGNOSIS — C44729 Squamous cell carcinoma of skin of left lower limb, including hip: Secondary | ICD-10-CM | POA: Diagnosis not present

## 2020-07-13 DIAGNOSIS — C44622 Squamous cell carcinoma of skin of right upper limb, including shoulder: Secondary | ICD-10-CM | POA: Diagnosis not present

## 2020-07-13 MED ORDER — DULOXETINE HCL 20 MG PO CPEP: 20.0000 mg | ORAL_CAPSULE | Freq: Every day | ORAL | 1 refills | Status: DC

## 2020-07-13 NOTE — Telephone Encounter (Signed)
Received fax from [facility]

## 2020-07-19 ENCOUNTER — Encounter: Payer: Self-pay | Admitting: Internal Medicine

## 2020-07-27 DIAGNOSIS — S81802D Unspecified open wound, left lower leg, subsequent encounter: Secondary | ICD-10-CM | POA: Diagnosis not present

## 2020-07-28 ENCOUNTER — Encounter: Payer: Self-pay | Admitting: Internal Medicine

## 2020-07-28 ENCOUNTER — Non-Acute Institutional Stay: Payer: Medicare HMO | Admitting: Internal Medicine

## 2020-07-28 ENCOUNTER — Other Ambulatory Visit: Payer: Self-pay

## 2020-07-28 VITALS — BP 138/82 | HR 94 | Temp 97.6°F | Ht 60.0 in | Wt 121.4 lb

## 2020-07-28 DIAGNOSIS — M810 Age-related osteoporosis without current pathological fracture: Secondary | ICD-10-CM | POA: Diagnosis not present

## 2020-07-28 DIAGNOSIS — M8949 Other hypertrophic osteoarthropathy, multiple sites: Secondary | ICD-10-CM | POA: Diagnosis not present

## 2020-07-28 DIAGNOSIS — M159 Polyosteoarthritis, unspecified: Secondary | ICD-10-CM

## 2020-07-28 DIAGNOSIS — R69 Illness, unspecified: Secondary | ICD-10-CM | POA: Diagnosis not present

## 2020-07-28 DIAGNOSIS — M7062 Trochanteric bursitis, left hip: Secondary | ICD-10-CM | POA: Diagnosis not present

## 2020-07-28 DIAGNOSIS — F325 Major depressive disorder, single episode, in full remission: Secondary | ICD-10-CM | POA: Diagnosis not present

## 2020-07-28 DIAGNOSIS — G3184 Mild cognitive impairment, so stated: Secondary | ICD-10-CM

## 2020-07-28 NOTE — Patient Instructions (Addendum)
Please go to [facility] imaging to get your left hip xrays done--arrange a ride with security for this. You could do this later today, tomorrow or Friday.  I will be going to [employer] as Market researcher of Palliative Care and Chronic Disease Management.

## 2020-07-28 NOTE — Progress Notes (Signed)
Location:  Occupational psychologist of Service:  Clinic (12)  Provider: Azhar Yogi L. Mariea Clonts, D.O., C.M.D.  Code Status: DNR Goals of Care:  Advanced Directives 07/28/2020  Does Patient Have a Medical Advance Directive? Yes  Type of Paramedic of [place];Out of facility DNR (pink MOST or yellow form)  Does patient want to make changes to medical advance directive? No - Patient declined  Copy of [place] in Chart? -  Pre-existing out of facility DNR order (yellow form or pink MOST form) Yellow form placed in chart (order not valid for inpatient use)     Chief Complaint  Patient presents with  . Medical Management of Chronic Issues    Medical Management of Chronic Issues.     HPI: Patient is a 87 y.o. female seen today for medical management of chronic diseases.    She is having more pain in her left knee and hip.  It's affecting her walking more.  Last time, I'd ordered xrays of her left hip but she has not gotten them done.  Plan had been for steroid injection if the cause was not arthritis vs bursitis.  She's been holding a light over the left knee with relief of pain.    She started evenity in June for her osteoporosis.  She has three more to go.    Past Medical History:  Diagnosis Date  . Arthritis    "all over"  . BACK PAIN 03/08/2007  . 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 DEPRESSIVE DISORDER 12/01/2008  . Duodenal ulcer   . GI bleed 09/12/2010   Naproxen induced Endoscopy with dr Harlene Ramus   . HIATAL HERNIA 12/17/2006  . History of hiatal hernia   . HYPERLIPIDEMIA 03/09/2006  . HYPERTENSION 03/09/2006  . KNEE PAIN 05/06/2009  . Macular degeneration   . Pneumonia    36yrs. ago- hosp. pneumonia  . Urinary urgency     Past Surgical History:  Procedure Laterality Date  . ABDOMINAL HYSTERECTOMY  1975  . APPLICATION OF A-CELL OF EXTREMITY Right 12/10/2014   Procedure: APPLICATION OF A-CELL  OF EXTREMITY;  Surgeon: Theodoro Kos, DO;  Location: [facility];  Service: Plastics;  Laterality: Right;  . arthroscopic knee Right    x 2  . BACK SURGERY  2000   spinal fusion  . BILATERAL SALPINGECTOMY Bilateral 1989   Dr. Marianna Fuss  . CARDIAC CATHETERIZATION    . EYE SURGERY     cataracts bilateral - removed  . HERNIA REPAIR Bilateral    inguinal   . I & D EXTREMITY Right 12/10/2014   Procedure: IRRIGATION AND DEBRIDEMENT ON RIGHT LEG, ;  Surgeon: Theodoro Kos, DO;  Location: [facility];  Service: Plastics;  Laterality: Right;  . I & D EXTREMITY Right 01/20/2015   Procedure: IRRIGATION AND DEBRIDEMENT RIGHT LOWER LEG WOUND, with ACELL to Donor Site, SKIN GRAFT AND VAC Placement;  Surgeon: Theodoro Kos, DO;  Location: [facility];  Service: Plastics;  Laterality: Right;  . LUMBAR LAMINECTOMY  02-17-1999   with sinal fusion L3,4,5,&S1  . moes     moses surgery on R lle  . OOPHORECTOMY  1989  . RESECTION DISTAL CLAVICAL    . ROTATOR CUFF REPAIR Right 2001   x2  . SKIN GRAFT Right 05/29/2018  . THYROIDECTOMY    . TONSILLECTOMY  1928  . TUBAL LIGATION      Allergies  Allergen Reactions  .  Naproxen     Gi bleed  . Nsaids     GI BLEED  . Aspirin     GI bleed  . Ace Inhibitors     Cough   . Caffeine Other (See Comments)    Causes joints to be painful    Outpatient Encounter Medications as of 07/28/2020  Medication Sig  . acetaminophen (TYLENOL) 650 MG CR tablet Take 1 tablet (650 mg total) by mouth as needed (for pain).  . Ascorbic Acid (VITAMIN C) 1000 MG tablet Take 1,000 mg by mouth daily.  . Cholecalciferol (VITAMIN D3) 50 MCG (2000 UT) TABS Take 1 tablet by mouth daily.  . DULoxetine (CYMBALTA) 20 MG capsule Take 1 capsule (20 mg total) by mouth daily.  Marland Kitchen losartan (COZAAR) 50 MG tablet TAKE 1 TABLET DAILY  . Multiple Vitamins-Minerals (PRESERVISION AREDS 2+MULTI VIT PO) Take by mouth.  . Omega-3 1000 MG CAPS Take one capsule by mouth three times daily.  . Romosozumab-aqqg (EVENITY)  105 MG/1.17ML SOSY injection Inject 210 mg into the skin once.  Marland Kitchen UNABLE TO FIND Life extension multi vitamin 2 daily   No facility-administered encounter medications on file as of 07/28/2020.    Review of Systems:  Review of Systems  Constitutional: Negative for chills, fever and malaise/fatigue.  HENT: Positive for hearing loss. Negative for congestion and sore throat.   Eyes: Negative for blurred vision.       Glasses  Respiratory: Negative for cough and shortness of breath.   Cardiovascular: Negative for chest pain, palpitations and leg swelling.  Gastrointestinal: Negative for abdominal pain and constipation.  Genitourinary: Negative for dysuria.  Musculoskeletal: Positive for joint pain. Negative for falls.       Left hip and left knee  Skin: Negative for itching and rash.  Neurological: Negative for dizziness and loss of consciousness.  Endo/Heme/Allergies: Bruises/bleeds easily.  Psychiatric/Behavioral: Positive for memory loss. Negative for depression. The patient is not nervous/anxious and does not have insomnia.     Health Maintenance  Topic Date Due  . COVID-19 Vaccine (4 - Booster) 10/03/2020  . TETANUS/TDAP  12/12/2022  . INFLUENZA VACCINE  Completed  . DEXA SCAN  Completed  . PNA vac Low Risk Adult  Completed  . HPV VACCINES  Aged Out    Physical Exam: Vitals:   07/28/20 0948  BP: 138/82  Pulse: 94  Temp: 97.6 F (36.4 C)  TempSrc: Skin  SpO2: 96%  Weight: 121 lb 6.4 oz (55.1 kg)  Height: 5' (1.524 m)   Body mass index is 23.71 kg/m. Physical Exam Vitals reviewed.  Constitutional:      General: She is not in acute distress.    Appearance: Normal appearance. She is not toxic-appearing.  HENT:     Head: Normocephalic and atraumatic.  Eyes:     Conjunctiva/sclera: Conjunctivae normal.     Pupils: Pupils are equal, round, and reactive to light.  Cardiovascular:     Rate and Rhythm: Normal rate and regular rhythm.     Pulses: Normal pulses.      Heart sounds: Normal heart sounds.  Pulmonary:     Effort: Pulmonary effort is normal.     Breath sounds: Normal breath sounds. No wheezing, rhonchi or rales.  Abdominal:     General: Bowel sounds are normal.  Musculoskeletal:     Right lower leg: No edema.     Left lower leg: No edema.     Comments: Abnormal gait with left knee valgus deformity notable, minimal  tenderness over left greater trochanter  Neurological:     Mental Status: She is alert.     Gait: Gait abnormal.     Comments: Confused today after rushing up here, short-term memory loss; uses rollator walker  Psychiatric:        Mood and Affect: Mood normal.     Labs reviewed: Basic Metabolic Panel: Recent Labs    10/09/19 0300 12/02/19 0000  NA 140 141  K 4.3 4.1  CL 103 103  CO2 26* 26*  BUN 22* 23*  CREATININE 0.6 0.6  CALCIUM 9.7 9.2   Liver Function Tests: Recent Labs    12/02/19 0000  AST 21  ALT 13  ALKPHOS 68   No results for input(s): LIPASE, AMYLASE in the last 8760 hours. No results for input(s): AMMONIA in the last 8760 hours. CBC: Recent Labs    10/09/19 0300 12/02/19 0000  WBC 5.9 5.8  HGB 12.2 12.0  HCT 37 36  PLT 182 179   Lipid Panel: No results for input(s): CHOL, HDL, LDLCALC, TRIG, CHOLHDL, LDLDIRECT in the last 8760 hours. No results found for: HGBA1C  Procedures since last visit: No results found.  Assessment/Plan 1. Greater trochanteric bursitis of left hip -suspect she may have bursitis flared up as she does admit she walked 4 times to the dining room/main building from home one day over this past week when normally she will just go once a day -previously, I ordered left hip xrays but she never went--plan is for left hip bursa injection if cause is bursitis rather than OA of her hip, but I need the imaging first --given written instructions to call security for a ride to [facility]  2. Primary osteoarthritis involving multiple joints -especially left hip and  knee -progressing -cont rollator use -cont tylenol, cymbalta, topical therapies  3. Major depressive disorder with single episode, in full remission ([place]) -doing well on cymbalta 20mg  daily so continue same--no signs of depression today  4. Senile osteoporosis -has 3 more evenity injections (march, April, may) and then will be back on prolia in June  -cont D3 and weightbearing exercise as joints permit  5. Mild cognitive impairment with memory loss -is gradually progressing -needs written directions about labs, meds, any instructions for things to work well  Labs/tests ordered:  No new Next appt:  Keep 3/10 evenity; f/u with me after xrays possibly for left hip injection  Secundino Ellithorpe L. Paula Busenbark, D.O. [facility] [address], [place] [address] Cell Phone (Mon-Fri 8am-5pm):  [phone] On Call:  [phone] & follow prompts after 5pm & weekends Office Phone:  [phone] Office Fax:  [phone]

## 2020-07-30 ENCOUNTER — Other Ambulatory Visit: Payer: Self-pay

## 2020-07-30 ENCOUNTER — Ambulatory Visit
Admission: RE | Admit: 2020-07-30 | Discharge: 2020-07-30 | Disposition: A | Discharge status: Home / Self Care | Payer: Medicare HMO | Source: Ambulatory Visit | Attending: Internal Medicine | Admitting: Internal Medicine

## 2020-07-30 ENCOUNTER — Other Ambulatory Visit: Payer: Self-pay | Admitting: Internal Medicine

## 2020-07-30 DIAGNOSIS — M7062 Trochanteric bursitis, left hip: Secondary | ICD-10-CM

## 2020-07-30 DIAGNOSIS — M1612 Unilateral primary osteoarthritis, left hip: Secondary | ICD-10-CM | POA: Diagnosis not present

## 2020-07-30 DIAGNOSIS — Z981 Arthrodesis status: Secondary | ICD-10-CM | POA: Diagnosis not present

## 2020-08-01 NOTE — Progress Notes (Signed)
Fortunately, Vanessa Mason has only mild arthritis in the left hip.  This suggests that her recent increased pain is more likely bursitis and we could treat it with a steroid injection in the [facility] if she would like.  If she agrees, please schedule her a follow-up and Rodena Piety will need to be sure we have all of the supplies for clinic.

## 2020-08-04 ENCOUNTER — Encounter: Payer: Self-pay | Admitting: Internal Medicine

## 2020-08-04 ENCOUNTER — Non-Acute Institutional Stay: Payer: Medicare HMO | Admitting: Internal Medicine

## 2020-08-04 ENCOUNTER — Other Ambulatory Visit: Payer: Self-pay

## 2020-08-04 VITALS — BP 152/88 | HR 79 | Temp 97.2°F | Ht 60.0 in | Wt 123.6 lb

## 2020-08-04 DIAGNOSIS — I10 Essential (primary) hypertension: Secondary | ICD-10-CM | POA: Diagnosis not present

## 2020-08-04 DIAGNOSIS — M7062 Trochanteric bursitis, left hip: Secondary | ICD-10-CM

## 2020-08-04 NOTE — Progress Notes (Signed)
Location:  [facility] of Service:  Clinic (12)  Provider: Paeton Studer L. Mariea Clonts, D.O., C.M.D.  Code Status: DNR Goals of Care:  Advanced Directives 07/28/2020  Does Patient Have a Medical Advance Directive? Yes  Type of Paramedic of [place];Out of facility DNR (pink MOST or yellow form)  Does patient want to make changes to medical advance directive? No - Patient declined  Copy of [place] in Chart? -  Pre-existing out of facility DNR order (yellow form or pink MOST form) Yellow form placed in chart (order not valid for inpatient use)     Chief Complaint  Patient presents with  . Medical Management of Chronic Issues    Medical Management of Chronic Issue. Follow up Left Hip Pain.     HPI: Patient is a 87 y.o. female seen today for an acute visit for left hip pain.  Arthritis only mild on xrays.  Seems to be a bursitis after she overdid walking the one day.  Has three more evenity injections with next one tomorrow at [facility].    Past Medical History:  Diagnosis Date  . Arthritis    "all over"  . BACK PAIN 03/08/2007  . 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 DEPRESSIVE DISORDER 12/01/2008  . Duodenal ulcer   . GI bleed 09/12/2010   Naproxen induced Endoscopy with dr Harlene Ramus   . HIATAL HERNIA 12/17/2006  . History of hiatal hernia   . HYPERLIPIDEMIA 03/09/2006  . HYPERTENSION 03/09/2006  . KNEE PAIN 05/06/2009  . Macular degeneration   . Pneumonia    74yrs. ago- hosp. pneumonia  . Urinary urgency     Past Surgical History:  Procedure Laterality Date  . ABDOMINAL HYSTERECTOMY  1975  . APPLICATION OF A-CELL OF EXTREMITY Right 12/10/2014   Procedure: APPLICATION OF A-CELL OF EXTREMITY;  Surgeon: Theodoro Kos, DO;  Location: [facility];  Service: Plastics;  Laterality: Right;  . arthroscopic knee Right    x 2  . BACK SURGERY  2000   spinal fusion  . BILATERAL SALPINGECTOMY Bilateral 1989   Dr. Marianna Fuss  .  CARDIAC CATHETERIZATION    . EYE SURGERY     cataracts bilateral - removed  . HERNIA REPAIR Bilateral    inguinal   . I & D EXTREMITY Right 12/10/2014   Procedure: IRRIGATION AND DEBRIDEMENT ON RIGHT LEG, ;  Surgeon: Theodoro Kos, DO;  Location: [facility];  Service: Plastics;  Laterality: Right;  . I & D EXTREMITY Right 01/20/2015   Procedure: IRRIGATION AND DEBRIDEMENT RIGHT LOWER LEG WOUND, with ACELL to Donor Site, SKIN GRAFT AND VAC Placement;  Surgeon: Theodoro Kos, DO;  Location: [facility];  Service: Plastics;  Laterality: Right;  . LUMBAR LAMINECTOMY  02-17-1999   with sinal fusion L3,4,5,&S1  . moes     moses surgery on R lle  . OOPHORECTOMY  1989  . RESECTION DISTAL CLAVICAL    . ROTATOR CUFF REPAIR Right 2001   x2  . SKIN GRAFT Right 05/29/2018  . THYROIDECTOMY    . TONSILLECTOMY  1928  . TUBAL LIGATION      Allergies  Allergen Reactions  . Naproxen     Gi bleed  . Nsaids     GI BLEED  . Aspirin     GI bleed  . Ace Inhibitors     Cough   . Caffeine Other (See Comments)    Causes joints  to be painful    Outpatient Encounter Medications as of 08/04/2020  Medication Sig  . acetaminophen (TYLENOL) 650 MG CR tablet Take 1 tablet (650 mg total) by mouth as needed (for pain).  . Ascorbic Acid (VITAMIN C) 1000 MG tablet Take 1,000 mg by mouth daily.  . Cholecalciferol (VITAMIN D3) 50 MCG (2000 UT) TABS Take 1 tablet by mouth daily.  . DULoxetine (CYMBALTA) 20 MG capsule Take 1 capsule (20 mg total) by mouth daily.  Marland Kitchen losartan (COZAAR) 50 MG tablet TAKE 1 TABLET DAILY  . Multiple Vitamins-Minerals (PRESERVISION AREDS 2+MULTI VIT PO) Take by mouth.  . Omega-3 1000 MG CAPS Take one capsule by mouth three times daily.  . Romosozumab-aqqg (EVENITY) 105 MG/1.17ML SOSY injection Inject 210 mg into the skin once.  Marland Kitchen UNABLE TO FIND Life extension multi vitamin 2 daily   No facility-administered encounter medications on file as of 08/04/2020.    Review of Systems:  Review of Systems   Constitutional: Negative for chills and fever.  HENT: Positive for hearing loss.   Eyes: Negative for blurred vision.       Gets eye injections  Respiratory: Negative for cough and shortness of breath.   Cardiovascular: Negative for chest pain, palpitations and leg swelling.  Gastrointestinal: Negative for abdominal pain.  Genitourinary: Negative for dysuria.  Musculoskeletal: Positive for joint pain. Negative for back pain and falls.       Left hip, sometimes left knee pain--gives way, uses rollator  Skin: Negative for rash.  Neurological: Negative for dizziness and loss of consciousness.  Endo/Heme/Allergies: Bruises/bleeds easily.  Psychiatric/Behavioral: Positive for memory loss. Negative for depression. The patient is not nervous/anxious and does not have insomnia.     Health Maintenance  Topic Date Due  . COVID-19 Vaccine (4 - Booster) 10/03/2020  . TETANUS/TDAP  12/12/2022  . INFLUENZA VACCINE  Completed  . DEXA SCAN  Completed  . PNA vac Low Risk Adult  Completed  . HPV VACCINES  Aged Out    Physical Exam: Vitals:   08/04/20 1503  BP: (!) 152/88  Pulse: 79  Temp: (!) 97.2 F (36.2 C)  TempSrc: Skin  SpO2: 99%  Weight: 123 lb 9.6 oz (56.1 kg)  Height: 5' (1.524 m)   Body mass index is 24.14 kg/m. Physical Exam Vitals reviewed.  Constitutional:      Appearance: Normal appearance.  Cardiovascular:     Rate and Rhythm: Normal rate and regular rhythm.     Pulses: Normal pulses.     Heart sounds: Normal heart sounds.  Pulmonary:     Effort: Pulmonary effort is normal.     Breath sounds: Normal breath sounds. No wheezing, rhonchi or rales.  Abdominal:     General: Bowel sounds are normal.  Musculoskeletal:     Right lower leg: No edema.     Left lower leg: No edema.     Comments: Left trochanteric bursa tenderness  Neurological:     General: No focal deficit present.     Mental Status: She is alert and oriented to person, place, and time.     Gait: Gait  abnormal.     Comments: Short-term memory loss  Psychiatric:        Mood and Affect: Mood normal.        Behavior: Behavior normal.     Labs reviewed: Basic Metabolic Panel: Recent Labs    10/09/19 0300 12/02/19 0000  NA 140 141  K 4.3 4.1  CL 103 103  CO2 26* 26*  BUN 22* 23*  CREATININE 0.6 0.6  CALCIUM 9.7 9.2   Liver Function Tests: Recent Labs    12/02/19 0000  AST 21  ALT 13  ALKPHOS 68   No results for input(s): LIPASE, AMYLASE in the last 8760 hours. No results for input(s): AMMONIA in the last 8760 hours. CBC: Recent Labs    10/09/19 0300 12/02/19 0000  WBC 5.9 5.8  HGB 12.2 12.0  HCT 37 36  PLT 182 179   Lipid Panel: No results for input(s): CHOL, HDL, LDLCALC, TRIG, CHOLHDL, LDLDIRECT in the last 8760 hours. No results found for: HGBA1C  Procedures since last visit: DG HIP UNILAT WITH PELVIS 2-3 VIEWS LEFT  Result Date: 08/01/2020 CLINICAL DATA:  Left hip pain EXAM: DG HIP (WITH OR WITHOUT PELVIS) 2-3V LEFT COMPARISON:  None. FINDINGS: Single view radiograph the pelvis and two view radiograph of the left hip demonstrates normal alignment. No fracture or dislocation. Mild left hip degenerative arthritis. Fusion hardware is partially visualized within the lumbosacral junction. Soft tissues are unremarkable. IMPRESSION: Mild degenerative change.  No fracture or dislocation. Electronically Signed   By: Fidela Salisbury MD   On: 08/01/2020 03:20    Assessment/Plan 1. Greater trochanteric bursitis of left hip - left hip cleansed with betadine x 2 after marking location, sprayed with bupivicaine anesthetic, then injected bursa with 80mg  depomedrol and 2cc 1% lidocaine - methylPREDNISolone acetate (DEPO-MEDROL) injection 80 mg - lidocaine (PF) (XYLOCAINE) 1 % injection 2 mL  2. Essential hypertension - sent in losartan to [facility] b/c must have been ordered too late from mail order and had not received - losartan (COZAAR) 50 MG tablet; Take 1 tablet  (50 mg total) by mouth daily.  Dispense: 30 tablet; Refill: 0   Labs/tests ordered:  No new Next appt:  4 mos med mgt  Trell Secrist L. Astor Gentle, D.O. [facility] [address], [place] [address] Cell Phone (Mon-Fri 8am-5pm):  [phone] On Call:  [phone] & follow prompts after 5pm & weekends Office Phone:  [phone] Office Fax:  [phone]

## 2020-08-05 ENCOUNTER — Ambulatory Visit (INDEPENDENT_AMBULATORY_CARE_PROVIDER_SITE_OTHER): Payer: Medicare HMO

## 2020-08-05 ENCOUNTER — Other Ambulatory Visit: Payer: Self-pay

## 2020-08-05 ENCOUNTER — Ambulatory Visit: Payer: Medicare HMO

## 2020-08-05 DIAGNOSIS — M810 Age-related osteoporosis without current pathological fracture: Secondary | ICD-10-CM

## 2020-08-05 MED ORDER — LOSARTAN POTASSIUM 50 MG PO TABS: 50.0000 mg | ORAL_TABLET | Freq: Every day | ORAL | 0 refills | Status: DC

## 2020-08-05 MED ORDER — ROMOSOZUMAB-AQQG 105 MG/1.17ML ~~LOC~~ SOSY
210.0000 mg | PREFILLED_SYRINGE | Freq: Once | SUBCUTANEOUS | Status: AC
Start: 2020-08-05 — End: 2020-08-05
  Administered 2020-08-05: 210 mg via SUBCUTANEOUS

## 2020-08-05 MED ORDER — METHYLPREDNISOLONE ACETATE 80 MG/ML IJ SUSP
80.0000 mg | Freq: Once | INTRAMUSCULAR | Status: AC
  Administered 2020-08-04: 80 mg via INTRAMUSCULAR

## 2020-08-05 MED ORDER — LIDOCAINE HCL (PF) 1 % IJ SOLN
2.0000 mL | Freq: Once | INTRAMUSCULAR | Status: AC
Start: 2020-08-05 — End: 2020-08-04
  Administered 2020-08-04: 2 mL

## 2020-08-06 DIAGNOSIS — H35033 Hypertensive retinopathy, bilateral: Secondary | ICD-10-CM | POA: Diagnosis not present

## 2020-08-06 DIAGNOSIS — H353221 Exudative age-related macular degeneration, left eye, with active choroidal neovascularization: Secondary | ICD-10-CM | POA: Diagnosis not present

## 2020-08-06 DIAGNOSIS — H353112 Nonexudative age-related macular degeneration, right eye, intermediate dry stage: Secondary | ICD-10-CM | POA: Diagnosis not present

## 2020-08-06 DIAGNOSIS — H43813 Vitreous degeneration, bilateral: Secondary | ICD-10-CM | POA: Diagnosis not present

## 2020-09-03 DIAGNOSIS — S81802D Unspecified open wound, left lower leg, subsequent encounter: Secondary | ICD-10-CM | POA: Diagnosis not present

## 2020-09-08 ENCOUNTER — Ambulatory Visit: Payer: Medicare HMO

## 2020-09-09 ENCOUNTER — Ambulatory Visit: Payer: Medicare HMO

## 2020-09-15 ENCOUNTER — Other Ambulatory Visit: Payer: Self-pay

## 2020-09-15 ENCOUNTER — Non-Acute Institutional Stay: Payer: Medicare HMO | Admitting: Internal Medicine

## 2020-09-15 ENCOUNTER — Encounter: Payer: Self-pay | Admitting: Internal Medicine

## 2020-09-15 VITALS — BP 138/80 | HR 91 | Temp 96.7°F | Ht 60.0 in | Wt 118.0 lb

## 2020-09-15 DIAGNOSIS — F325 Major depressive disorder, single episode, in full remission: Secondary | ICD-10-CM | POA: Diagnosis not present

## 2020-09-15 DIAGNOSIS — R69 Illness, unspecified: Secondary | ICD-10-CM | POA: Diagnosis not present

## 2020-09-15 DIAGNOSIS — M810 Age-related osteoporosis without current pathological fracture: Secondary | ICD-10-CM

## 2020-09-15 DIAGNOSIS — G3184 Mild cognitive impairment, so stated: Secondary | ICD-10-CM

## 2020-09-15 DIAGNOSIS — M7062 Trochanteric bursitis, left hip: Secondary | ICD-10-CM | POA: Diagnosis not present

## 2020-09-15 DIAGNOSIS — M8949 Other hypertrophic osteoarthropathy, multiple sites: Secondary | ICD-10-CM | POA: Diagnosis not present

## 2020-09-15 DIAGNOSIS — M159 Polyosteoarthritis, unspecified: Secondary | ICD-10-CM

## 2020-09-16 ENCOUNTER — Other Ambulatory Visit: Payer: Self-pay

## 2020-09-16 ENCOUNTER — Ambulatory Visit (INDEPENDENT_AMBULATORY_CARE_PROVIDER_SITE_OTHER): Payer: Medicare HMO

## 2020-09-16 DIAGNOSIS — M810 Age-related osteoporosis without current pathological fracture: Secondary | ICD-10-CM | POA: Diagnosis not present

## 2020-09-16 MED ORDER — ROMOSOZUMAB-AQQG 105 MG/1.17ML ~~LOC~~ SOSY
105.0000 mg | PREFILLED_SYRINGE | Freq: Once | SUBCUTANEOUS | Status: AC
  Administered 2020-09-16: 105 mg via SUBCUTANEOUS

## 2020-09-20 ENCOUNTER — Other Ambulatory Visit (HOSPITAL_BASED_OUTPATIENT_CLINIC_OR_DEPARTMENT_OTHER): Payer: Self-pay

## 2020-09-20 ENCOUNTER — Other Ambulatory Visit: Payer: Self-pay

## 2020-09-20 ENCOUNTER — Ambulatory Visit: Payer: Self-pay | Attending: Internal Medicine

## 2020-09-20 DIAGNOSIS — Z23 Encounter for immunization: Secondary | ICD-10-CM

## 2020-09-20 MED ORDER — COVID-19 MRNA VACC (MODERNA) 100 MCG/0.5ML IM SUSP
INTRAMUSCULAR | 0 refills | Status: DC
Start: 2020-09-20 — End: 2021-09-13
  Filled 2020-09-20: qty 0.25, 1d supply, fill #0

## 2020-09-20 NOTE — Progress Notes (Signed)
   Covid-19 Vaccination Clinic  Name:  Vanessa Mason    MRN: [id number] DOB: 11/24/1917  09/20/2020  Vanessa Mason was observed post Covid-19 immunization for 15 minutes without incident. She was provided with Vaccine Information Sheet and instruction to access the V-Safe system.   Vanessa Mason was instructed to call 911 with any severe reactions post vaccine: Marland Kitchen Difficulty breathing  . Swelling of face and throat  . A fast heartbeat  . A bad rash all over body  . Dizziness and weakness   Immunizations Administered    Name Date Dose VIS Date Route   Moderna Covid-19 Booster Vaccine 09/20/2020  2:03 PM 0.25 mL 03/17/2020 Intramuscular   Manufacturer: Moderna   Lot: [id number]   [place]: [id number]

## 2020-09-22 DIAGNOSIS — C44622 Squamous cell carcinoma of skin of right upper limb, including shoulder: Secondary | ICD-10-CM | POA: Diagnosis not present

## 2020-09-22 DIAGNOSIS — D485 Neoplasm of uncertain behavior of skin: Secondary | ICD-10-CM | POA: Diagnosis not present

## 2020-09-22 DIAGNOSIS — Z85828 Personal history of other malignant neoplasm of skin: Secondary | ICD-10-CM | POA: Diagnosis not present

## 2020-09-22 DIAGNOSIS — C44519 Basal cell carcinoma of skin of other part of trunk: Secondary | ICD-10-CM | POA: Diagnosis not present

## 2020-09-22 DIAGNOSIS — L905 Scar conditions and fibrosis of skin: Secondary | ICD-10-CM | POA: Diagnosis not present

## 2020-09-23 DIAGNOSIS — H903 Sensorineural hearing loss, bilateral: Secondary | ICD-10-CM | POA: Diagnosis not present

## 2020-09-25 NOTE — Progress Notes (Signed)
Location:  [place] of Service:  Clinic (12)  Provider:   Code Status: DNR Goals of Care:  Advanced Directives 07/28/2020  Does Patient Have a Medical Advance Directive? Yes  Type of Paramedic of [place];Out of facility DNR (pink MOST or yellow form)  Does patient want to make changes to medical advance directive? No - Patient declined  Copy of [place] in Chart? -  Pre-existing out of facility DNR order (yellow form or pink MOST form) Yellow form placed in chart (order not valid for inpatient use)     Chief Complaint  Patient presents with  . Medical Management of Chronic Issues    Patient returns to the clinic for follow up and to meet Dr. Lyndel Safe. She would like to discuss her Evenity injections.     HPI: Patient is a 87 y.o. female seen today for medical management of chronic diseases.    Patient has h/o Osteoporosis ON Evenity since June 21 T score of -4.5 in 11/20 H/o Trochanteric bursitis s/P Steroid Shots by Dr Mariea Clonts Osteoarthritis Depression  Doing well. Did not have any new complains Wanted to meet me to make sure she wants to stay with [facility] or change her PCP  Also Concerned about her Evenity cost Walks with her walker Does not Drive Anymore. Has Daughter who helps  Independent in her ADLS. No caregivers No walks. Says she mostly stays in her Apartment. Past Medical History:  Diagnosis Date  . Arthritis    "all over"  . BACK PAIN 03/08/2007  . 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 DEPRESSIVE DISORDER 12/01/2008  . Duodenal ulcer   . GI bleed 09/12/2010   Naproxen induced Endoscopy with dr Harlene Ramus   . HIATAL HERNIA 12/17/2006  . History of hiatal hernia   . HYPERLIPIDEMIA 03/09/2006  . HYPERTENSION 03/09/2006  . KNEE PAIN 05/06/2009  . Macular degeneration   . Pneumonia    8yrs. ago- hosp. pneumonia  . Urinary urgency     Past Surgical History:  Procedure  Laterality Date  . ABDOMINAL HYSTERECTOMY  1975  . APPLICATION OF A-CELL OF EXTREMITY Right 12/10/2014   Procedure: APPLICATION OF A-CELL OF EXTREMITY;  Surgeon: Theodoro Kos, DO;  Location: [facility];  Service: Plastics;  Laterality: Right;  . arthroscopic knee Right    x 2  . BACK SURGERY  2000   spinal fusion  . BILATERAL SALPINGECTOMY Bilateral 1989   Dr. Marianna Fuss  . CARDIAC CATHETERIZATION    . EYE SURGERY     cataracts bilateral - removed  . HERNIA REPAIR Bilateral    inguinal   . I & D EXTREMITY Right 12/10/2014   Procedure: IRRIGATION AND DEBRIDEMENT ON RIGHT LEG, ;  Surgeon: Theodoro Kos, DO;  Location: [facility];  Service: Plastics;  Laterality: Right;  . I & D EXTREMITY Right 01/20/2015   Procedure: IRRIGATION AND DEBRIDEMENT RIGHT LOWER LEG WOUND, with ACELL to Donor Site, SKIN GRAFT AND VAC Placement;  Surgeon: Theodoro Kos, DO;  Location: [facility];  Service: Plastics;  Laterality: Right;  . LUMBAR LAMINECTOMY  02-17-1999   with sinal fusion L3,4,5,&S1  . moes     moses surgery on R lle  . OOPHORECTOMY  1989  . RESECTION DISTAL CLAVICAL    . ROTATOR CUFF REPAIR Right 2001   x2  . SKIN GRAFT Right 05/29/2018  . THYROIDECTOMY    . TONSILLECTOMY  1928  . TUBAL LIGATION      Allergies  Allergen Reactions  . Naproxen     Gi bleed  . Nsaids     GI BLEED  . Aspirin     GI bleed  . Ace Inhibitors     Cough   . Caffeine Other (See Comments)    Causes joints to be painful    Outpatient Encounter Medications as of 09/15/2020  Medication Sig  . acetaminophen (TYLENOL) 650 MG CR tablet Take 1 tablet (650 mg total) by mouth as needed (for pain).  . Ascorbic Acid (VITAMIN C) 1000 MG tablet Take 1,000 mg by mouth daily.  . Cholecalciferol (VITAMIN D3) 50 MCG (2000 UT) TABS Take 1 tablet by mouth daily.  . DULoxetine (CYMBALTA) 20 MG capsule Take 1 capsule (20 mg total) by mouth daily.  Marland Kitchen losartan (COZAAR) 50 MG tablet Take 1 tablet (50 mg total) by mouth daily.  . Multiple  Vitamins-Minerals (PRESERVISION AREDS 2+MULTI VIT PO) Take by mouth.  . Omega-3 1000 MG CAPS Take one capsule by mouth three times daily.  . Romosozumab-aqqg (EVENITY) 105 MG/1.17ML SOSY injection Inject 210 mg into the skin once.  Marland Kitchen UNABLE TO FIND Life extension multi vitamin 2 daily   No facility-administered encounter medications on file as of 09/15/2020.    Review of Systems:  Review of Systems  Review of Systems  Constitutional: Negative for activity change, appetite change, chills, diaphoresis, fatigue and fever.  HENT: Negative for mouth sores, postnasal drip, rhinorrhea, sinus pain and sore throat.   Respiratory: Negative for apnea, cough, chest tightness, shortness of breath and wheezing.   Cardiovascular: Negative for chest pain, palpitations and leg swelling.  Gastrointestinal: Negative for abdominal distention, abdominal pain, constipation, diarrhea, nausea and vomiting.  Genitourinary: Negative for dysuria and frequency.  Musculoskeletal: Negative for arthralgias, joint swelling and myalgias.  Skin: Negative for rash.  Neurological: Negative for dizziness, syncope, weakness, light-headedness and numbness.  Psychiatric/Behavioral: Negative for behavioral problems, confusion and sleep disturbance.     Health Maintenance  Topic Date Due  . INFLUENZA VACCINE  12/27/2020  . TETANUS/TDAP  12/12/2022  . DEXA SCAN  Completed  . COVID-19 Vaccine  Completed  . PNA vac Low Risk Adult  Completed  . HPV VACCINES  Aged Out    Physical Exam: Vitals:   09/15/20 1124  BP: 138/80  Pulse: 91  Temp: (!) 96.7 F (35.9 C)  SpO2: 97%  Weight: 118 lb (53.5 kg)  Height: 5' (1.524 m)   Body mass index is 23.05 kg/m. Physical Exam  Constitutional: Oriented to person, place, and time. Well-developed and well-nourished.  HENT:  Head: Normocephalic.  Mouth/Throat: Oropharynx is clear and moist.  Eyes: Pupils are equal, round, and reactive to light.  Neck: Neck supple.   Cardiovascular: Normal rate and normal heart sounds.  No murmur heard. Pulmonary/Chest: Effort normal and breath sounds normal. No respiratory distress. No wheezes. She has no rales.  Abdominal: Soft. Bowel sounds are normal. No distension. There is no tenderness. There is no rebound.  Musculoskeletal: No edema.  Lymphadenopathy: none Neurological: Alert and oriented to person, place, and time. No Focal Deficits Walks well with her walker Skin: Skin is warm and dry.  Psychiatric: Normal mood and affect. Behavior is normal. Thought content normal.    Labs reviewed: Basic Metabolic Panel: Recent Labs    10/09/19 0300 12/02/19 0000  NA 140 141  K 4.3 4.1  CL 103 103  CO2 26* 26*  BUN  22* 23*  CREATININE 0.6 0.6  CALCIUM 9.7 9.2   Liver Function Tests: Recent Labs    12/02/19 0000  AST 21  ALT 13  ALKPHOS 68   No results for input(s): LIPASE, AMYLASE in the last 8760 hours. No results for input(s): AMMONIA in the last 8760 hours. CBC: Recent Labs    10/09/19 0300 12/02/19 0000  WBC 5.9 5.8  HGB 12.2 12.0  HCT 37 36  PLT 182 179   Lipid Panel: No results for input(s): CHOL, HDL, LDLCALC, TRIG, CHOLHDL, LDLDIRECT in the last 8760 hours. No results found for: HGBA1C  Procedures since last visit: No results found.  Assessment/Plan Senile osteoporosis On Evenity since June of Last year  3 more doses. Then  Prolia Last T score -4.5 Greater trochanteric bursitis of left hip Doing well with no pain right now Primary osteoarthritis involving multiple joints Tylenol Prn  Uses Walker Major depressive disorder with single episode, in full remission ([place]) On Cymbalta doing well Mild cognitive impairment with memory loss Highly functional    Labs/tests ordered:  Labs Before Next visit Next appt:  09/16/2020

## 2020-10-11 DIAGNOSIS — C44519 Basal cell carcinoma of skin of other part of trunk: Secondary | ICD-10-CM | POA: Diagnosis not present

## 2020-10-11 DIAGNOSIS — D485 Neoplasm of uncertain behavior of skin: Secondary | ICD-10-CM | POA: Diagnosis not present

## 2020-10-11 DIAGNOSIS — C44622 Squamous cell carcinoma of skin of right upper limb, including shoulder: Secondary | ICD-10-CM | POA: Diagnosis not present

## 2020-10-18 ENCOUNTER — Other Ambulatory Visit: Payer: Self-pay

## 2020-10-18 ENCOUNTER — Ambulatory Visit: Payer: Medicare HMO | Admitting: *Deleted

## 2020-10-18 DIAGNOSIS — M810 Age-related osteoporosis without current pathological fracture: Secondary | ICD-10-CM

## 2020-10-18 MED ORDER — ROMOSOZUMAB-AQQG 105 MG/1.17ML ~~LOC~~ SOSY
210.0000 mg | PREFILLED_SYRINGE | Freq: Once | SUBCUTANEOUS | Status: AC
  Administered 2020-10-18: 210 mg via SUBCUTANEOUS

## 2020-10-19 ENCOUNTER — Other Ambulatory Visit: Payer: Self-pay | Admitting: *Deleted

## 2020-10-19 DIAGNOSIS — I10 Essential (primary) hypertension: Secondary | ICD-10-CM

## 2020-10-19 MED ORDER — LOSARTAN POTASSIUM 50 MG PO TABS: 50.0000 mg | ORAL_TABLET | Freq: Every day | ORAL | 1 refills | Status: DC

## 2020-10-19 NOTE — Telephone Encounter (Signed)
[facility] Request refill

## 2020-10-22 DIAGNOSIS — C44519 Basal cell carcinoma of skin of other part of trunk: Secondary | ICD-10-CM | POA: Diagnosis not present

## 2020-10-22 DIAGNOSIS — L905 Scar conditions and fibrosis of skin: Secondary | ICD-10-CM | POA: Diagnosis not present

## 2020-10-22 DIAGNOSIS — C44622 Squamous cell carcinoma of skin of right upper limb, including shoulder: Secondary | ICD-10-CM | POA: Diagnosis not present

## 2020-10-29 DIAGNOSIS — H353221 Exudative age-related macular degeneration, left eye, with active choroidal neovascularization: Secondary | ICD-10-CM | POA: Diagnosis not present

## 2020-10-29 DIAGNOSIS — H353112 Nonexudative age-related macular degeneration, right eye, intermediate dry stage: Secondary | ICD-10-CM | POA: Diagnosis not present

## 2020-11-08 DIAGNOSIS — C44529 Squamous cell carcinoma of skin of other part of trunk: Secondary | ICD-10-CM | POA: Diagnosis not present

## 2020-11-08 DIAGNOSIS — C44622 Squamous cell carcinoma of skin of right upper limb, including shoulder: Secondary | ICD-10-CM | POA: Diagnosis not present

## 2020-11-08 DIAGNOSIS — D485 Neoplasm of uncertain behavior of skin: Secondary | ICD-10-CM | POA: Diagnosis not present

## 2020-11-19 ENCOUNTER — Ambulatory Visit: Payer: Medicare HMO | Admitting: *Deleted

## 2020-11-19 ENCOUNTER — Other Ambulatory Visit: Payer: Self-pay

## 2020-11-19 ENCOUNTER — Ambulatory Visit: Payer: Medicare HMO

## 2020-11-19 DIAGNOSIS — M810 Age-related osteoporosis without current pathological fracture: Secondary | ICD-10-CM | POA: Diagnosis not present

## 2020-11-19 MED ORDER — ROMOSOZUMAB-AQQG 105 MG/1.17ML ~~LOC~~ SOSY
105.0000 mg | PREFILLED_SYRINGE | Freq: Once | SUBCUTANEOUS | Status: AC
  Administered 2020-11-19: 210 mg via SUBCUTANEOUS

## 2020-12-17 ENCOUNTER — Telehealth: Payer: Self-pay

## 2020-12-17 NOTE — Telephone Encounter (Signed)
Patient's daughter called to schedule an appointment for next week at [facility] with Dr.Gupta for a possible UTI. I informed Hoyle Sauer that Dr.Gupta's schedule is booked, yet Royal Hawthorn, NP can see her on Monday.  Patient with urgency and increased confusion. Hoyle Sauer questions if the independent nurse can collect a urine sample today to have results available for Monday's visit. I informed Hoyle Sauer that I will send a message to [place], for she is on site today.   Please advise

## 2020-12-17 NOTE — Telephone Encounter (Signed)
This is the message that [facility] sent via routing comment:  I am having the [place] nurse go to assess her. If she has mild symptoms they will collect a urine on site and send it off and I can put her on an antibiotic. If she appears to have a change of condition they can use the med center for further assessment

## 2020-12-18 DIAGNOSIS — R300 Dysuria: Secondary | ICD-10-CM | POA: Diagnosis not present

## 2020-12-20 ENCOUNTER — Encounter: Payer: Medicare HMO | Admitting: Adult Health

## 2020-12-20 ENCOUNTER — Other Ambulatory Visit: Payer: Self-pay

## 2020-12-23 DIAGNOSIS — E785 Hyperlipidemia, unspecified: Secondary | ICD-10-CM | POA: Diagnosis not present

## 2020-12-23 DIAGNOSIS — M810 Age-related osteoporosis without current pathological fracture: Secondary | ICD-10-CM | POA: Diagnosis not present

## 2020-12-23 DIAGNOSIS — I10 Essential (primary) hypertension: Secondary | ICD-10-CM | POA: Diagnosis not present

## 2020-12-23 DIAGNOSIS — E559 Vitamin D deficiency, unspecified: Secondary | ICD-10-CM | POA: Diagnosis not present

## 2020-12-23 DIAGNOSIS — R413 Other amnesia: Secondary | ICD-10-CM | POA: Diagnosis not present

## 2020-12-23 DIAGNOSIS — K921 Melena: Secondary | ICD-10-CM | POA: Diagnosis not present

## 2020-12-23 DIAGNOSIS — M816 Localized osteoporosis [Lequesne]: Secondary | ICD-10-CM | POA: Diagnosis not present

## 2020-12-23 LAB — COMPREHENSIVE METABOLIC PANEL
Albumin: 4.4 (ref 3.5–5.0)
Calcium: 9.3 (ref 8.7–10.7)
Globulin: 1.7

## 2020-12-23 LAB — VITAMIN D 25 HYDROXY (VIT D DEFICIENCY, FRACTURES): Vit D, 25-Hydroxy: 67

## 2020-12-23 LAB — BASIC METABOLIC PANEL
BUN: 23 — AB (ref 4–21)
CO2: 24 — AB (ref 13–22)
Chloride: 102 (ref 99–108)
Creatinine: 0.6 (ref 0.5–1.1)
Glucose: 74
Potassium: 4 (ref 3.4–5.3)
Sodium: 141 (ref 137–147)

## 2020-12-23 LAB — HEPATIC FUNCTION PANEL
ALT: 13 (ref 7–35)
AST: 22 (ref 13–35)
Alkaline Phosphatase: 73 (ref 25–125)
Bilirubin, Total: 0.4

## 2020-12-23 LAB — CBC AND DIFFERENTIAL
HCT: 33 — AB (ref 36–46)
Hemoglobin: 11.1 — AB (ref 12.0–16.0)
Platelets: 152 (ref 150–399)
WBC: 3.8

## 2020-12-23 LAB — TSH: TSH: 2.79 (ref 0.41–5.90)

## 2020-12-23 LAB — CBC: RBC: 3.86 — AB (ref 3.87–5.11)

## 2020-12-24 DIAGNOSIS — L905 Scar conditions and fibrosis of skin: Secondary | ICD-10-CM | POA: Diagnosis not present

## 2020-12-24 DIAGNOSIS — C44529 Squamous cell carcinoma of skin of other part of trunk: Secondary | ICD-10-CM | POA: Diagnosis not present

## 2020-12-29 ENCOUNTER — Other Ambulatory Visit: Payer: Self-pay

## 2020-12-29 ENCOUNTER — Non-Acute Institutional Stay: Payer: Medicare HMO | Admitting: Internal Medicine

## 2020-12-29 ENCOUNTER — Encounter: Payer: Self-pay | Admitting: Internal Medicine

## 2020-12-29 VITALS — BP 144/84 | HR 82 | Temp 96.3°F | Ht 60.0 in | Wt 120.2 lb

## 2020-12-29 DIAGNOSIS — M7062 Trochanteric bursitis, left hip: Secondary | ICD-10-CM

## 2020-12-29 DIAGNOSIS — M810 Age-related osteoporosis without current pathological fracture: Secondary | ICD-10-CM

## 2020-12-29 DIAGNOSIS — M8949 Other hypertrophic osteoarthropathy, multiple sites: Secondary | ICD-10-CM | POA: Diagnosis not present

## 2020-12-29 DIAGNOSIS — I10 Essential (primary) hypertension: Secondary | ICD-10-CM

## 2020-12-29 DIAGNOSIS — G3184 Mild cognitive impairment, so stated: Secondary | ICD-10-CM

## 2020-12-29 DIAGNOSIS — M159 Polyosteoarthritis, unspecified: Secondary | ICD-10-CM

## 2020-12-29 NOTE — Progress Notes (Signed)
Location:  [place] of Service:  Clinic (12)  Provider:   Code Status: DNR Goals of Care:  Advanced Directives 12/29/2020  Does Patient Have a Medical Advance Directive? Yes  Type of Paramedic of [place];Living will;Out of facility DNR (pink MOST or yellow form)  Does patient want to make changes to medical advance directive? No - Patient declined  Copy of [place] in Chart? Yes - validated most recent copy scanned in chart (See row information)  Pre-existing out of facility DNR order (yellow form or pink MOST form) Yellow form placed in chart (order not valid for inpatient use)     Chief Complaint  Patient presents with   Medical Management of Chronic Issues    3 month follow up.    Health Maintenance    Shingrix and influenza vaccine    HPI: Patient is a 87 y.o. female seen today for medical management of chronic diseases.   Patient has h/o Osteoporosis ON Evenity since June 21 T score of -4.5 in 11/20 H/o Trochanteric bursitis s/P Steroid Shots by Dr Mariea Clonts Osteoarthritis specially Left Knee Depression  Moving in [place] Next week due to vision issues Osteoporosis Just finished her [employer]. Very reluctnat to start Prolia T score -4.5  Left Knee arthritis Wants to know if she can get shot as pain is not controlled Walks with [place]  Has 5 kids. Daughter in [place] Helping her move to [place].  Today patient was having issues with coming up with right words  Past Medical History:  Diagnosis Date   Arthritis    "all over"   BACK PAIN 03/08/2007   Cancer ([place])    squamous cell on R leg & face- ?basal cell   DEPRESSIVE DISORDER 12/01/2008   Duodenal ulcer    GI bleed 09/12/2010   Naproxen induced Endoscopy with dr Harlene Ramus    HIATAL HERNIA 12/17/2006   History of hiatal hernia    HYPERLIPIDEMIA 03/09/2006   HYPERTENSION 03/09/2006   KNEE PAIN 05/06/2009   Macular degeneration    Pneumonia     43yr. ago- hosp. pneumonia   Urinary urgency     Past Surgical History:  Procedure Laterality Date   ABDOMINAL HYSTERECTOMY  [id number]  APPLICATION OF A-CELL OF EXTREMITY Right 12/10/2014   Procedure: APPLICATION OF A-CELL OF EXTREMITY;  Surgeon: CTheodoro Kos DO;  Location: [facility]  Service: Plastics;  Laterality: Right;   arthroscopic knee Right    x 2   BACK SURGERY  2000   spinal fusion   BILATERAL SALPINGECTOMY Bilateral 1989   Dr. LMarianna Fuss  CARDIAC CATHETERIZATION     EYE SURGERY     cataracts bilateral - removed   HERNIA REPAIR Bilateral    inguinal    I & D EXTREMITY Right 12/10/2014   Procedure: IRRIGATION AND DEBRIDEMENT ON RIGHT LEG, ;  Surgeon: CTheodoro Kos DO;  Location: [facility]  Service: Plastics;  Laterality: Right;   I & D EXTREMITY Right 01/20/2015   Procedure: IRRIGATION AND DEBRIDEMENT RIGHT LOWER LEG WOUND, with ACELL to Donor Site, SKIN GRAFT AND VAC Placement;  Surgeon: CTheodoro Kos DO;  Location: [facility]  Service: Plastics;  Laterality: Right;   LUMBAR LAMINECTOMY  02-17-1999   with sinal fusion L3,4,5,&S1   moes     moses surgery on R lle   OOPHORECTOMY  1989   RESECTION DISTAL CLAVICAL     ROTATOR CUFF REPAIR Right 2001  x2   SKIN GRAFT Right 05/29/2018   THYROIDECTOMY     TONSILLECTOMY  1928   TUBAL LIGATION      Allergies  Allergen Reactions   Naproxen     Gi bleed   Nsaids     GI BLEED   Aspirin     GI bleed   Ace Inhibitors     Cough    Caffeine Other (See Comments)    Causes joints to be painful    Outpatient Encounter Medications as of 12/29/2020  Medication Sig   acetaminophen (TYLENOL) 650 MG CR tablet Take 1 tablet (650 mg total) by mouth as needed (for pain).   Ascorbic Acid (VITAMIN C) 1000 MG tablet Take 1,000 mg by mouth daily.   Cholecalciferol (VITAMIN D3) 50 MCG (2000 UT) TABS Take 1 tablet by mouth daily.   COVID-19 mRNA vaccine, Moderna, 100 MCG/0.5ML injection Inject into the muscle.   DULoxetine (CYMBALTA) 20 MG  capsule Take 1 capsule (20 mg total) by mouth daily.   losartan (COZAAR) 50 MG tablet Take 1 tablet (50 mg total) by mouth daily.   Multiple Vitamins-Minerals (PRESERVISION AREDS 2+MULTI VIT PO) Take by mouth.   Omega-3 1000 MG CAPS Take one capsule by mouth three times daily.   Romosozumab-aqqg (EVENITY) 105 MG/1.17ML SOSY injection Inject 210 mg into the skin once.   UNABLE TO FIND Life extension multi vitamin 2 daily   No facility-administered encounter medications on file as of 12/29/2020.    Review of Systems:  Review of Systems Review of Systems  Constitutional: Negative for activity change, appetite change, chills, diaphoresis, fatigue and fever.  HENT: Negative for mouth sores, postnasal drip, rhinorrhea, sinus pain and sore throat.   Respiratory: Negative for apnea, cough, chest tightness, shortness of breath and wheezing.   Cardiovascular: Negative for chest pain, palpitations and leg swelling.  Gastrointestinal: Negative for abdominal distention, abdominal pain, constipation, diarrhea, nausea and vomiting.  Genitourinary: Negative for dysuria and frequency.  Musculoskeletal: Negative for arthralgias, joint swelling and myalgias.  Skin: Negative for rash.  Neurological: Negative for dizziness, syncope, weakness, light-headedness and numbness.  Psychiatric/Behavioral: Negative for behavioral problems, confusion and sleep disturbance.    Health Maintenance  Topic Date Due   Zoster Vaccines- Shingrix (1 of 2) Never done   INFLUENZA VACCINE  12/27/2020   COVID-19 Vaccine (5 - Booster) 01/20/2021   TETANUS/TDAP  12/12/2022   DEXA SCAN  Completed   PNA vac Low Risk Adult  Completed   HPV VACCINES  Aged Out    Physical Exam: Vitals:   12/29/20 1025  BP: (!) 144/84  Pulse: 82  Temp: (!) 96.3 F (35.7 C)  SpO2: 99%  Weight: 120 lb 3.2 oz (54.5 kg)  Height: 5' (1.524 m)   Body mass index is 23.47 kg/m. Physical Exam Constitutional: Oriented to person, place, and time.  Well-developed and well-nourished.  HENT:  Head: Normocephalic.  Mouth/Throat: Oropharynx is clear and moist.  Eyes: Pupils are equal, round, and reactive to light.  Neck: Neck supple.  Cardiovascular: Normal rate and normal heart sounds.  No murmur heard. Pulmonary/Chest: Effort normal and breath sounds normal. No respiratory distress. No wheezes. She has no rales.  Abdominal: Soft. Bowel sounds are normal. No distension. There is no tenderness. There is no rebound.  Musculoskeletal: No edema. Left Knee arthritis Lymphadenopathy: none Neurological: Alert and oriented to person, place, and time.  Mild aphasia Noticed Skin: Skin is warm and dry.  Psychiatric: Normal mood and affect. Behavior is  normal. Thought content normal.   Labs reviewed: Basic Metabolic Panel: No results for input(s): NA, K, CL, CO2, GLUCOSE, BUN, CREATININE, CALCIUM, MG, PHOS, TSH in the last 8760 hours. Liver Function Tests: No results for input(s): AST, ALT, ALKPHOS, BILITOT, PROT, ALBUMIN in the last 8760 hours. No results for input(s): LIPASE, AMYLASE in the last 8760 hours. No results for input(s): AMMONIA in the last 8760 hours. CBC: No results for input(s): WBC, NEUTROABS, HGB, HCT, MCV, PLT in the last 8760 hours. Lipid Panel: No results for input(s): CHOL, HDL, LDLCALC, TRIG, CHOLHDL, LDLDIRECT in the last 8760 hours. No results found for: HGBA1C  Procedures since last visit: No results found.  Assessment/Plan  Primary osteoarthritis involving multiple joints - Plan: Ambulatory referral to Orthopedic Surgery  Senile osteoporosis Just finished her Evenity Needs to start on Prolia Last T score -4.5 Greater trochanteric bursitis of left hip Doing well no Pain Right now Mild cognitive impairment with memory loss Planning to move to [place] now Essential hypertension On Cozaar Depression On Cymbalta Labs/tests ordered:  * No order type specified * Next appt:  Visit date not found

## 2020-12-29 NOTE — Patient Instructions (Signed)
We will arrange for you to start Prolia Shots Also making Referal to Ortho for your  Left Knee possible injection

## 2020-12-31 ENCOUNTER — Telehealth: Payer: Self-pay | Admitting: *Deleted

## 2020-12-31 NOTE — Telephone Encounter (Signed)
Patient daughter, [name], called and stated that patient is fixing to move into [place] at [facility]. Stated that she was told that patient's medications would have to come from [facility].  Patient is about to run out of medications and wants to know how to go about getting them refilled. Stated that patient was just seen 12/29/2020. Wants to know will the facility refill and give them to patient or does she need to still go through local pharmacy until the move.   Please Advise.

## 2020-12-31 NOTE — Telephone Encounter (Signed)
I have talked to the Supervisor in [place] and they will talk to her and explain how they are going to do the refills

## 2021-01-03 ENCOUNTER — Other Ambulatory Visit: Payer: Self-pay | Admitting: *Deleted

## 2021-01-03 DIAGNOSIS — F325 Major depressive disorder, single episode, in full remission: Secondary | ICD-10-CM

## 2021-01-03 MED ORDER — DULOXETINE HCL 20 MG PO CPEP: 20.0000 mg | ORAL_CAPSULE | Freq: Every day | ORAL | 1 refills | Status: DC

## 2021-01-03 NOTE — Telephone Encounter (Signed)
[facility] requested refill.

## 2021-01-21 DIAGNOSIS — H43813 Vitreous degeneration, bilateral: Secondary | ICD-10-CM | POA: Diagnosis not present

## 2021-01-21 DIAGNOSIS — H353112 Nonexudative age-related macular degeneration, right eye, intermediate dry stage: Secondary | ICD-10-CM | POA: Diagnosis not present

## 2021-01-21 DIAGNOSIS — H353221 Exudative age-related macular degeneration, left eye, with active choroidal neovascularization: Secondary | ICD-10-CM | POA: Diagnosis not present

## 2021-01-21 DIAGNOSIS — H16143 Punctate keratitis, bilateral: Secondary | ICD-10-CM | POA: Diagnosis not present

## 2021-02-02 ENCOUNTER — Ambulatory Visit: Payer: Self-pay

## 2021-02-02 ENCOUNTER — Other Ambulatory Visit: Payer: Self-pay

## 2021-02-02 ENCOUNTER — Ambulatory Visit (INDEPENDENT_AMBULATORY_CARE_PROVIDER_SITE_OTHER): Payer: Medicare HMO

## 2021-02-02 ENCOUNTER — Ambulatory Visit (INDEPENDENT_AMBULATORY_CARE_PROVIDER_SITE_OTHER): Payer: Medicare HMO | Admitting: Orthopedic Surgery

## 2021-02-02 DIAGNOSIS — M25552 Pain in left hip: Secondary | ICD-10-CM

## 2021-02-02 DIAGNOSIS — M25562 Pain in left knee: Secondary | ICD-10-CM

## 2021-02-02 DIAGNOSIS — M1712 Unilateral primary osteoarthritis, left knee: Secondary | ICD-10-CM

## 2021-02-06 ENCOUNTER — Encounter: Payer: Self-pay | Admitting: Orthopedic Surgery

## 2021-02-06 DIAGNOSIS — M25552 Pain in left hip: Secondary | ICD-10-CM | POA: Diagnosis not present

## 2021-02-06 DIAGNOSIS — M1712 Unilateral primary osteoarthritis, left knee: Secondary | ICD-10-CM | POA: Diagnosis not present

## 2021-02-06 MED ORDER — LIDOCAINE HCL 1 % IJ SOLN
5.0000 mL | INTRAMUSCULAR | Status: AC | PRN
  Administered 2021-02-02: 5 mL

## 2021-02-06 MED ORDER — LIDOCAINE HCL 1 % IJ SOLN
5.0000 mL | INTRAMUSCULAR | Status: AC | PRN
  Administered 2021-02-06: 5 mL

## 2021-02-06 MED ORDER — METHYLPREDNISOLONE ACETATE 40 MG/ML IJ SUSP
40.0000 mg | INTRAMUSCULAR | Status: AC | PRN
  Administered 2021-02-02: 40 mg via INTRA_ARTICULAR

## 2021-02-06 MED ORDER — BUPIVACAINE HCL 0.25 % IJ SOLN
4.0000 mL | INTRAMUSCULAR | Status: AC | PRN
  Administered 2021-02-02: 4 mL via INTRA_ARTICULAR

## 2021-02-06 MED ORDER — BUPIVACAINE HCL 0.25 % IJ SOLN
4.0000 mL | INTRAMUSCULAR | Status: AC | PRN
  Administered 2021-02-06: 4 mL via INTRA_ARTICULAR

## 2021-02-06 MED ORDER — METHYLPREDNISOLONE ACETATE 40 MG/ML IJ SUSP
40.0000 mg | INTRAMUSCULAR | Status: AC | PRN
  Administered 2021-02-06: 40 mg via INTRA_ARTICULAR

## 2021-02-06 NOTE — Progress Notes (Signed)
Office Visit Note   Patient: Vanessa Mason           Date of Birth: 25-Jul-1917           MRN: [id number] Visit Date: 02/02/2021 Requested by: Virgie Dad, MD [place],  [place] [address] PCP: Virgie Dad, MD  Subjective: Chief Complaint  Patient presents with   Other    Knee and hip pain     HPI: [name] is a 87 y.o. female who presents to the office complaining of left knee and left hip pain.  Patient complains of chronic pain for several years.  Pain is worse with activity.  She localizes most of her knee pain to the anterior aspect of the knee with radiation to the medial lateral joint lines.  She states pain does not bother her at rest or wake her up at night.  She has to walk with a walker because it feels like her knee wants to give out on her.  She has had no recent falls or injuries that she is very careful when she is ambulating.  She also notes some lateral groin pain and lateral hip pain of the left hip.  No radicular pain or numbness/tingling.  She has not had any injections in her knee or hip recently.  She has had lateral bursitis pain in the past that she has received injection for but cannot recall if this helped or not.  No history of knee or hip surgery according to her.  No history of diabetes.  She takes Tylenol for pain control.  She lives in an assisted living facility that she recently moved into.  She occasionally uses a compression knee sleeve for her knee pain and instability.  Denies any mechanical symptoms of her knee..                ROS: All systems reviewed are negative as they relate to the chief complaint within the history of present illness.  Patient denies fevers or chills.  Assessment & Plan: Visit Diagnoses:  1. Left knee pain, unspecified chronicity   2. Greater trochanteric pain syndrome of left lower extremity     Plan: Patient is a 87 year old female who presents for evaluation of left knee and  left hip pain.  She has had pain for years with most of her knee pain localized to the joint lines and most of her hip pain localized to the lateral aspect of the left hip.  She has prior radiographs from March 2022 of her hip demonstrating minimal arthritic changes of the left hip joint.  Radiographs of the left knee taken today demonstrate valgus alignment with primarily lateral compartment arthritis rated moderate to severe.  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  The arthritic pain in combination with the ligamentous laxity is likely contributing to her knee instability.  Recommended hinged knee brace for maintaining stability and offered knee injection with cortisone today.  Patient agreed with plan.  She tolerated the injection well.  In addition, she has primarily lateral sided hip pain with no significant arthritis on radiographs of the left hip and no pain with range of motion of the left hip on exam.  Impression is greater trochanteric pain syndrome of the left leg.  Offered injection for this which she has had in the past but cannot recall  if it helped.  Under ultrasound guidance, left trochanter was identified and successfully injected with cortisone.  Patient tolerated this injection well.  Plan for her to follow-up with the office in 4 months for clinical recheck and repeat injection at that time if she had good relief today.  Follow-Up Instructions: No follow-ups on file.   Orders:  Orders Placed This Encounter  Procedures   XR Knee 1-2 Views Left   US Guided Needle Placement - No Linked Charges   No orders of the defined types were placed in this encounter.     Procedures: Large Joint Inj: L knee on 02/02/2021 1:37 PM Indications: diagnostic evaluation, joint swelling and pain Details: 18 G 1.5 in needle, superolateral approach  Arthrogram:  No  Medications: 5 mL lidocaine 1 %; 40 mg methylPREDNISolone acetate 40 MG/ML; 4 mL bupivacaine 0.25 % Outcome: tolerated well, no immediate complications Procedure, treatment alternatives, risks and benefits explained, specific risks discussed. Consent was given by the patient. Immediately prior to procedure a time out was called to verify the correct patient, procedure, equipment, support staff and site/side marked as required. Patient was prepped and draped in the usual sterile fashion.    Large Joint Inj: L greater trochanter on 02/06/2021 1:37 PM Indications: pain and diagnostic evaluation Details: 18 G 1.5 in needle, ultrasound-guided lateral approach  Arthrogram: No  Medications: 5 mL lidocaine 1 %; 4 mL bupivacaine 0.25 %; 40 mg methylPREDNISolone acetate 40 MG/ML Outcome: tolerated well, no immediate complications Procedure, treatment alternatives, risks and benefits explained, specific risks discussed. Consent was given by the patient. Immediately prior to procedure a time out was called to verify the correct patient, procedure, equipment, support staff and site/side marked as required. Patient was prepped and draped in the usual sterile fashion.      Clinical Data: No additional findings.  Objective: Vital Signs: There were no vitals taken for this visit.  Physical Exam:  Constitutional: Patient appears well-developed HEENT:  Head: Normocephalic Eyes:EOM are normal Neck: Normal range of motion Cardiovascular: Normal rate Pulmonary/chest: Effort normal Neurologic: Patient is alert Skin: Skin is warm Psychiatric: Patient has normal mood and affect  Ortho Exam: Ortho exam demonstrates left knee with 0 degrees extension and 110 degrees of knee flexion.  Able to perform straight leg raise with intact extensor mechanism.  Tenderness over the medial and lateral joint lines, more so over the medial.  No significant effusion present.  Increased laxity with anterior drawer  compared with contralateral knee.  Increased laxity with Lachman exam.  No calf tenderness.  Negative Homans' sign.  No pain with hip range of motion of the left hip but she does have asymmetric tenderness over the greater trochanter of the left hip compared to the right.  Negative straight leg raise.  Specialty Comments:  No specialty comments available.  Imaging: No results found.   PMFS History: Patient Active Problem List   Diagnosis Date Noted   Major depressive disorder with single episode, in full remission ([place]) 07/28/2020   Trochanteric bursitis of left hip 04/09/2019   Word finding difficulty 04/09/2019   Mild cognitive impairment with memory loss 04/09/2019   Senile osteoporosis 12/27/2018   Chronic constipation 12/11/2014   Depression 06/18/2014   Actinic keratosis 02/10/2014   History of partial thyroidectomy 12/30/2013   Rash and nonspecific skin eruption 12/30/2013   DNR (do not resuscitate) 12/16/2013   LBBB (left bundle branch block) 07/30/2013   Osteoarthritis of left knee 06/17/2013   Hyperlipemia 09/12/2010  BACK PAIN 03/08/2007   HIATAL HERNIA 12/17/2006   Essential hypertension 03/09/2006   Past Medical History:  Diagnosis Date   Arthritis    "all over"   BACK PAIN 03/08/2007   Cancer ([place])    squamous cell on R leg & face- ?basal cell   DEPRESSIVE DISORDER 12/01/2008   Duodenal ulcer    GI bleed 09/12/2010   Naproxen induced Endoscopy with dr Harlene Ramus    HIATAL HERNIA 12/17/2006   History of hiatal hernia    HYPERLIPIDEMIA 03/09/2006   HYPERTENSION 03/09/2006   KNEE PAIN 05/06/2009   Macular degeneration    Pneumonia    15yr. ago- hosp. pneumonia   Urinary urgency     Family History  Problem Relation Age of Onset   Cancer Mother 815  Cancer Brother     Past Surgical History:  Procedure Laterality Date   ABDOMINAL HYSTERECTOMY  [id number]  APPLICATION OF A-CELL OF EXTREMITY Right 12/10/2014   Procedure: APPLICATION OF A-CELL OF EXTREMITY;   Surgeon: CTheodoro Kos DO;  [facility]  Service: Plastics;  Laterality: Right;   arthroscopic knee Right    x 2   BACK SURGERY  2000   spinal fusion   BILATERAL SALPINGECTOMY Bilateral 1989   Dr. LMarianna Fuss  CARDIAC CATHETERIZATION     EYE SURGERY     cataracts bilateral - removed   HERNIA REPAIR Bilateral    inguinal    I & D EXTREMITY Right 12/10/2014   Procedure: IRRIGATION AND DEBRIDEMENT ON RIGHT LEG, ;  Surgeon: CTheodoro Kos DO;  [facility]  Service: Plastics;  Laterality: Right;   I & D EXTREMITY Right 01/20/2015   Procedure: IRRIGATION AND DEBRIDEMENT RIGHT LOWER LEG WOUND, with ACELL to Donor Site, SKIN GRAFT AND VAC Placement;  Surgeon: CTheodoro Kos DO;  [facility]  Service: Plastics;  Laterality: Right;   LUMBAR LAMINECTOMY  02-17-1999   with sinal fusion L3,4,5,&S1   [facility]     moses surgery on R lle   OOPHORECTOMY  1989   RESECTION DISTAL CLAVICAL     ROTATOR CUFF REPAIR Right 2001   x2   SKIN GRAFT Right 05/29/2018   THYROIDECTOMY     TONSILLECTOMY  1928   TUBAL LIGATION     Social History   Occupational History   Not on file  Tobacco Use   Smoking status: Former    Years: 16.00    Types: Cigarettes   Smokeless tobacco: Never   Tobacco comments:    Quit at age 87 Vaping Use   Vaping Use: Never used  Substance and Sexual Activity   Alcohol use: Yes    Alcohol/week: 7.0 standard drinks    Types: 7 Glasses of wine per week    Comment: wine occasionally @ dinner   Drug use: No   Sexual activity: Not on file

## 2021-02-18 DIAGNOSIS — H353112 Nonexudative age-related macular degeneration, right eye, intermediate dry stage: Secondary | ICD-10-CM | POA: Diagnosis not present

## 2021-02-18 DIAGNOSIS — H16143 Punctate keratitis, bilateral: Secondary | ICD-10-CM | POA: Diagnosis not present

## 2021-02-18 DIAGNOSIS — H43813 Vitreous degeneration, bilateral: Secondary | ICD-10-CM | POA: Diagnosis not present

## 2021-02-18 DIAGNOSIS — H353221 Exudative age-related macular degeneration, left eye, with active choroidal neovascularization: Secondary | ICD-10-CM | POA: Diagnosis not present

## 2021-02-23 ENCOUNTER — Other Ambulatory Visit: Payer: Self-pay

## 2021-02-23 ENCOUNTER — Encounter (INDEPENDENT_AMBULATORY_CARE_PROVIDER_SITE_OTHER): Payer: Medicare HMO | Admitting: Internal Medicine

## 2021-02-23 DIAGNOSIS — M810 Age-related osteoporosis without current pathological fracture: Secondary | ICD-10-CM

## 2021-02-23 MED ORDER — DENOSUMAB 60 MG/ML ~~LOC~~ SOSY
60.0000 mg | PREFILLED_SYRINGE | Freq: Once | SUBCUTANEOUS | Status: AC
  Administered 2021-02-23: 60 mg via SUBCUTANEOUS

## 2021-02-24 ENCOUNTER — Ambulatory Visit: Payer: Medicare HMO

## 2021-02-28 DIAGNOSIS — H353112 Nonexudative age-related macular degeneration, right eye, intermediate dry stage: Secondary | ICD-10-CM | POA: Diagnosis not present

## 2021-02-28 DIAGNOSIS — H2640 Unspecified secondary cataract: Secondary | ICD-10-CM | POA: Diagnosis not present

## 2021-02-28 DIAGNOSIS — H524 Presbyopia: Secondary | ICD-10-CM | POA: Diagnosis not present

## 2021-02-28 DIAGNOSIS — H353221 Exudative age-related macular degeneration, left eye, with active choroidal neovascularization: Secondary | ICD-10-CM | POA: Diagnosis not present

## 2021-02-28 NOTE — Progress Notes (Signed)
This encounter was created in error - please disregard.

## 2021-03-09 DIAGNOSIS — C44622 Squamous cell carcinoma of skin of right upper limb, including shoulder: Secondary | ICD-10-CM | POA: Diagnosis not present

## 2021-03-09 DIAGNOSIS — C44729 Squamous cell carcinoma of skin of left lower limb, including hip: Secondary | ICD-10-CM | POA: Diagnosis not present

## 2021-03-16 DIAGNOSIS — H26491 Other secondary cataract, right eye: Secondary | ICD-10-CM | POA: Diagnosis not present

## 2021-04-04 DIAGNOSIS — I872 Venous insufficiency (chronic) (peripheral): Secondary | ICD-10-CM | POA: Diagnosis not present

## 2021-04-04 DIAGNOSIS — Z48817 Encounter for surgical aftercare following surgery on the skin and subcutaneous tissue: Secondary | ICD-10-CM | POA: Diagnosis not present

## 2021-04-04 DIAGNOSIS — C44729 Squamous cell carcinoma of skin of left lower limb, including hip: Secondary | ICD-10-CM | POA: Diagnosis not present

## 2021-04-12 ENCOUNTER — Encounter: Payer: Medicare HMO | Admitting: Nurse Practitioner

## 2021-04-21 DIAGNOSIS — R079 Chest pain, unspecified: Secondary | ICD-10-CM | POA: Diagnosis not present

## 2021-04-23 ENCOUNTER — Other Ambulatory Visit: Payer: Self-pay | Admitting: Internal Medicine

## 2021-04-23 MED ORDER — OXYCODONE HCL 5 MG PO TABS: 2.5000 mg | ORAL_TABLET | Freq: Two times a day (BID) | ORAL | 0 refills | Status: AC

## 2021-04-23 NOTE — Progress Notes (Signed)
Patient had Left Sided Chest Pain onset on Wed. Chest xray showed Rib fracture of 6,7 and 8th rib. No Pneumothorax no SOB. Tylenol not helping Will do Oxycodone 2.5 mg BID for 7 days

## 2021-04-25 ENCOUNTER — Encounter: Payer: Self-pay | Admitting: Adult Health

## 2021-04-25 ENCOUNTER — Non-Acute Institutional Stay: Payer: Medicare HMO | Admitting: Adult Health

## 2021-04-25 ENCOUNTER — Telehealth: Payer: Self-pay

## 2021-04-25 DIAGNOSIS — S2242XA Multiple fractures of ribs, left side, initial encounter for closed fracture: Secondary | ICD-10-CM

## 2021-04-25 MED ORDER — OXYCODONE HCL 5 MG PO TABS: 2.5000 mg | ORAL_TABLET | Freq: Two times a day (BID) | ORAL | 0 refills | Status: AC | PRN

## 2021-04-25 MED ORDER — LIDOCAINE 5 % EX PTCH: 1.0000 | MEDICATED_PATCH | CUTANEOUS | 0 refills | Status: DC

## 2021-04-25 MED ORDER — ACETAMINOPHEN 500 MG PO TABS: 1000.0000 mg | ORAL_TABLET | Freq: Three times a day (TID) | ORAL | 0 refills | Status: DC

## 2021-04-25 NOTE — Telephone Encounter (Signed)
Could we add her on to the clinic schedule in two weeks to see me?

## 2021-04-25 NOTE — Telephone Encounter (Signed)
Patients daughter [name] called questioning when [place] plans to follow-up with her mother after today's visit to reassess pain and treatment plan   Please advise

## 2021-04-25 NOTE — Progress Notes (Signed)
Location:  Occupational psychologist of Service:  ALF (13) Provider:   Cindi Carbon, [place] [phone]   Virgie Dad, MD  Patient Care Team: Virgie Dad, MD as PCP - General (Internal Medicine) Ronald Lobo, MD as Consulting Physician (Gastroenterology) Vickey Huger, MD as Consulting Physician (Orthopedic Surgery) Izora Ribas as Consulting Physician (Dermatology) Darlin Coco, MD as Consulting Physician (Cardiology) Luberta Mutter, MD as Consulting Physician (Ophthalmology) Karin Golden, MD as Referring Physician (Dermatology)  Extended Emergency Contact Information Primary Emergency Contact: Clearence Ped of [place] Phone: [phone] Relation: Daughter Secondary Emergency Contact: Ronni Rumble [place] of [place] Phone: [phone] Relation: Friend  Code Status:  DNR Goals of care: Advanced Directive information Advanced Directives 12/29/2020  Does Patient Have a Medical Advance Directive? Yes  Type of Paramedic of [place];Living will;Out of facility DNR (pink MOST or yellow form)  Does patient want to make changes to medical advance directive? No - Patient declined  Copy of [place] in Chart? Yes - validated most recent copy scanned in chart (See row information)  Pre-existing out of facility DNR order (yellow form or pink MOST form) Yellow form placed in chart (order not valid for inpatient use)     Chief Complaint  Patient presents with   Acute Visit    Rib fx    HPI:  Pt is a 87 y.o. female seen today for an acute visit for rib pain. She was having left sided chest pain on 11/24 and a CXR was ordered which showed undisplaced rib fractures of the left 6th, 7th, and 8th rib.  She is taking tylenol and oxycodone with little relief. Describes the pain as sharp with a deep breath. She is using an IS. Reports that she is lying in her  recliner a lot to help relieve the pain. She has a hx of osteoporosis and takes evenity. She has not had a fall or injury that she is aware of. Reports possibly "pulling" something in an exercise class.    Past Medical History:  Diagnosis Date   Arthritis    "all over"   BACK PAIN 03/08/2007   Cancer ([place])    squamous cell on R leg & face- ?basal cell   DEPRESSIVE DISORDER 12/01/2008   Duodenal ulcer    GI bleed 09/12/2010   Naproxen induced Endoscopy with dr Harlene Ramus    HIATAL HERNIA 12/17/2006   History of hiatal hernia    HYPERLIPIDEMIA 03/09/2006   HYPERTENSION 03/09/2006   KNEE PAIN 05/06/2009   Macular degeneration    Pneumonia    86yrs. ago- hosp. pneumonia   Urinary urgency    Past Surgical History:  Procedure Laterality Date   ABDOMINAL HYSTERECTOMY  7628   APPLICATION OF A-CELL OF EXTREMITY Right 12/10/2014   Procedure: APPLICATION OF A-CELL OF EXTREMITY;  Surgeon: Theodoro Kos, DO;  Location: [facility];  Service: Plastics;  Laterality: Right;   arthroscopic knee Right    x 2   BACK SURGERY  2000   spinal fusion   BILATERAL SALPINGECTOMY Bilateral 1989   Dr. Marianna Fuss   CARDIAC CATHETERIZATION     EYE SURGERY     cataracts bilateral - removed   HERNIA REPAIR Bilateral    inguinal    I & D EXTREMITY Right 12/10/2014   Procedure: IRRIGATION AND DEBRIDEMENT ON RIGHT LEG, ;  Surgeon: Theodoro Kos, DO;  Location: [facility];  Service: Plastics;  Laterality:  Right;   I & D EXTREMITY Right 01/20/2015   Procedure: IRRIGATION AND DEBRIDEMENT RIGHT LOWER LEG WOUND, with ACELL to Donor Site, SKIN GRAFT AND VAC Placement;  Surgeon: Theodoro Kos, DO;  Location: [facility];  Service: Plastics;  Laterality: Right;   LUMBAR LAMINECTOMY  02-17-1999   with sinal fusion L3,4,5,&S1   [facility]     moses surgery on R lle   OOPHORECTOMY  1989   RESECTION DISTAL CLAVICAL     ROTATOR CUFF REPAIR Right 2001   x2   SKIN GRAFT Right 05/29/2018   THYROIDECTOMY     TONSILLECTOMY  1928   TUBAL LIGATION       Allergies  Allergen Reactions   Naproxen     Gi bleed   Nsaids     GI BLEED   Aspirin     GI bleed   Ace Inhibitors     Cough    Caffeine Other (See Comments)    Causes joints to be painful    Outpatient Encounter Medications as of 04/25/2021  Medication Sig   acetaminophen (TYLENOL) 650 MG CR tablet Take 1 tablet (650 mg total) by mouth as needed (for pain).   Ascorbic Acid (VITAMIN C) 1000 MG tablet Take 1,000 mg by mouth daily.   Cholecalciferol (VITAMIN D3) 50 MCG (2000 UT) TABS Take 1 tablet by mouth daily.   COVID-19 mRNA vaccine, Moderna, 100 MCG/0.5ML injection Inject into the muscle.   DULoxetine (CYMBALTA) 20 MG capsule Take 1 capsule (20 mg total) by mouth daily.   losartan (COZAAR) 50 MG tablet Take 1 tablet (50 mg total) by mouth daily.   Multiple Vitamins-Minerals (PRESERVISION AREDS 2+MULTI VIT PO) Take by mouth.   Omega-3 1000 MG CAPS Take one capsule by mouth three times daily.   oxyCODONE (ROXICODONE) 5 MG immediate release tablet Take 0.5 tablets (2.5 mg total) by mouth in the morning and at bedtime for 7 days.   UNABLE TO FIND Life extension multi vitamin 2 daily   No facility-administered encounter medications on file as of 04/25/2021.    Review of Systems  Constitutional:  Negative for activity change, appetite change, chills, diaphoresis, fatigue, fever and unexpected weight change.  HENT:  Negative for congestion.   Respiratory:  Negative for cough, shortness of breath and wheezing.   Cardiovascular:  Positive for chest pain (left rib). Negative for palpitations and leg swelling.  Gastrointestinal:  Negative for abdominal distention, abdominal pain, constipation and diarrhea.  Genitourinary:  Negative for difficulty urinating and dysuria.  Musculoskeletal:  Positive for gait problem. Negative for arthralgias, back pain, joint swelling and myalgias.  Neurological:  Negative for dizziness, tremors, seizures, syncope, facial asymmetry, speech  difficulty, weakness, light-headedness, numbness and headaches.  Psychiatric/Behavioral:  Negative for agitation, behavioral problems and confusion.    Immunization History  Administered Date(s) Administered   Fluad Quad(high Dose 65+) 04/05/2020   Influenza Split 03/08/2011, 03/05/2012   Influenza Whole 03/08/2007, 03/02/2009, 03/29/2010   Influenza, High Dose Seasonal PF 03/21/2013, 06/13/2017, 03/07/2019, 03/26/2020   Influenza,inj,Quad PF,6+ Mos 02/20/2014, 02/11/2015, 03/22/2018   Influenza-Unspecified 03/23/2016, 04/05/2020   Moderna SARS-COV2 Booster Vaccination 09/20/2020   Moderna Sars-Covid-2 Vaccination 06/10/2019, 07/09/2019   PFIZER(Purple Top)SARS-COV-2 Vaccination 04/05/2020   Pneumococcal Conjugate-13 07/30/2014   Pneumococcal Polysaccharide-23 02/03/2002, 12/16/2009   Td 02/03/2002   Tdap 12/11/2012   Pertinent  Health Maintenance Due  Topic Date Due   INFLUENZA VACCINE  12/27/2020   DEXA SCAN  Completed   Fall Risk 01/28/2020 04/06/2020 05/19/2020 09/15/2020 12/29/2020  Falls in the past year? 0 1 1 0 0  Was there an injury with Fall? 0 0 0 1 -  Was there an injury with Fall? - - - - -  Fall Risk Category Calculator 0 2 1 - -  Fall Risk Category Low Moderate Low - -  Patient Fall Risk Level Low fall risk Moderate fall risk Moderate fall risk - Low fall risk  Patient Fall Risk Level - Patient fell while getting dressed in closet. - - -  Fall risk Follow up - - - - Falls evaluation completed   Functional Status Survey:    There were no vitals filed for this visit. There is no height or weight on file to calculate BMI. Physical Exam Vitals and nursing note reviewed.  Constitutional:      General: She is not in acute distress.    Appearance: She is not diaphoretic.  HENT:     Head: Normocephalic and atraumatic.  Neck:     Vascular: No JVD.  Cardiovascular:     Rate and Rhythm: Normal rate and regular rhythm.     Heart sounds: No murmur heard. Pulmonary:      Effort: Pulmonary effort is normal. No respiratory distress.     Breath sounds: Wheezing (exp wheeze) present.     Comments: Barrel chest. Mild tenderness to palpation at the left rib cage.  Skin:    General: Skin is warm and dry.  Neurological:     Mental Status: She is alert and oriented to person, place, and time.    Labs reviewed: Recent Labs    12/23/20 0000  NA 141  K 4.0  CL 102  CO2 24*  BUN 23*  CREATININE 0.6  CALCIUM 9.3   Recent Labs    12/23/20 0000  AST 22  ALT 13  ALKPHOS 73  ALBUMIN 4.4   Recent Labs    12/23/20 0000  WBC 3.8  HGB 11.1*  HCT 33*  PLT 152   Lab Results  Component Value Date   TSH 2.79 12/23/2020   No results found for: HGBA1C Lab Results  Component Value Date   CHOL 238 (A) 08/20/2018   HDL 67 08/20/2018   LDLCALC 154 08/20/2018   LDLDIRECT 118.7 07/20/2011   TRIG 85 08/20/2018   CHOLHDL 4 12/30/2013    Significant Diagnostic Results in last 30 days:  No results found.  Assessment/Plan  1. Closed fracture of multiple ribs of left side, initial encounter Increase tylenol to 1 gram tid Add prn oxycodone for 14 days Continue IS 10 breaths 4 x day Lidocaine patch 5% on in am off in pm Rib belt   Family/ staff Communication: discussed with resident   Labs/tests ordered:  NA

## 2021-04-26 ENCOUNTER — Other Ambulatory Visit: Payer: Self-pay | Admitting: Orthopedic Surgery

## 2021-04-26 DIAGNOSIS — K5903 Drug induced constipation: Secondary | ICD-10-CM

## 2021-04-26 MED ORDER — POLYETHYLENE GLYCOL 3350 17 GM/SCOOP PO POWD: 17.0000 g | Freq: Every day | ORAL | 1 refills | Status: DC | PRN

## 2021-04-26 MED ORDER — SENNA-DOCUSATE SODIUM 8.6-50 MG PO TABS: 2.0000 | ORAL_TABLET | Freq: Every day | ORAL | 0 refills | Status: DC

## 2021-04-26 NOTE — Telephone Encounter (Signed)
I called [name] and informed her of the plan to follow-up with Bettie in 2 weeks and if she needs [place] before then she can call.  [name] recommended that we call Bettie after 10:30 to schedule appointment

## 2021-04-26 NOTE — Telephone Encounter (Signed)
[name] called back stating she had spoke with her mom who is about to take a nap and she is calling back on her behalf about scheduling a follow-up appointment.  [name] asked if her mom can see Dr.Gupta tomorrow. I advised that Dr.Gupta is booked and Dr.Gupta's next appt at [facility] is 05/18/21.   [name] then asked what is the earliest her mother can be seen by anyone. I offer appointment for tomorrow at [facility] with Bard Herbert and [name] proceeded to ask if Alyse Low can call her directly to discuss her mother's care. [name] states the pain medicine is helping sometimes and not at others and it would be best to speak with the provider verse going through a third party Therapist, art).

## 2021-04-26 NOTE — Telephone Encounter (Signed)
Pain management discussed with [name]. We also discussed complications of narcotic use including constipation and increased risk for falls. Orders for senna and miralax given to [facility] staff.

## 2021-05-13 ENCOUNTER — Non-Acute Institutional Stay: Payer: Medicare HMO | Admitting: Adult Health

## 2021-05-13 ENCOUNTER — Encounter: Payer: Self-pay | Admitting: Adult Health

## 2021-05-13 DIAGNOSIS — S2242XD Multiple fractures of ribs, left side, subsequent encounter for fracture with routine healing: Secondary | ICD-10-CM

## 2021-05-13 DIAGNOSIS — K429 Umbilical hernia without obstruction or gangrene: Secondary | ICD-10-CM

## 2021-05-13 DIAGNOSIS — K5903 Drug induced constipation: Secondary | ICD-10-CM | POA: Diagnosis not present

## 2021-05-13 DIAGNOSIS — M8080XD Other osteoporosis with current pathological fracture, unspecified site, subsequent encounter for fracture with routine healing: Secondary | ICD-10-CM | POA: Diagnosis not present

## 2021-05-13 NOTE — Progress Notes (Signed)
Location:   [facility]  Provider: Royal Hawthorn, NP  Virgie Dad, MD  Patient Care Team: Virgie Dad, MD as PCP - General (Internal Medicine) Ronald Lobo, MD as Consulting Physician (Gastroenterology) Vickey Huger, MD as Consulting Physician (Orthopedic Surgery) Izora Ribas as Consulting Physician (Dermatology) Darlin Coco, MD as Consulting Physician (Cardiology) Luberta Mutter, MD as Consulting Physician (Ophthalmology) Karin Golden, MD as Referring Physician (Dermatology)  Extended Emergency Contact Information Primary Emergency Contact: Clearence Ped of [place] Phone: [phone] Relation: Daughter Secondary Emergency Contact: [facility] Address: [place] [address] Johnnette Litter of [place] Phone: [phone] Mobile Phone: [phone] Relation: Son  Code Status:  DNR Goals of care: Advanced Directive information Advanced Directives 05/13/2021  Does Patient Have a Medical Advance Directive? Yes  Type of Advance Directive Living will;Out of facility DNR (pink MOST or yellow form)  Does patient want to make changes to medical advance directive? No - Patient declined  Copy of [place] in Chart? -  Pre-existing out of facility DNR order (yellow form or pink MOST form) Yellow form placed in chart (order not valid for inpatient use)     Chief Complaint  Patient presents with   Acute Visit    HPI:  Pt is a 87 y.o. female seen today for an acute visit for follow up regarding rib fractures.  CXR was ordered 11/27 which showed undisplaced rib fractures of the left 6th, 7th, and 8th rib. She has been using tylenol scheduled, prn oxycodone, lidocain patch, and IS four times daily. She is more mobile now and her pain is improved but still present. She is not having a cough or sputum production. Eating and drinking well. Her concern today is  that she has an umbilical hernia. No pain or vomiting. Some mild discomfort at times.  Also she is on senokot since she is on oxycodone prn. No reports of constipation.  She uses the oxycodone each day for a few days then non for a few days. Recently she walked a long distance and then felt more pain afterward that the rib cage.   OP on evenity with T score -4.5 04/15/19, worse from 2018 -4.3 Past Medical History:  Diagnosis Date   Arthritis    "all over"   BACK PAIN 03/08/2007   Cancer ([place])    squamous cell on R leg & face- ?basal cell   DEPRESSIVE DISORDER 12/01/2008   Duodenal ulcer    GI bleed 09/12/2010   Naproxen induced Endoscopy with dr Harlene Ramus    HIATAL HERNIA 12/17/2006   History of hiatal hernia    HYPERLIPIDEMIA 03/09/2006   HYPERTENSION 03/09/2006   KNEE PAIN 05/06/2009   Macular degeneration    Pneumonia    45yrs. ago- hosp. pneumonia   Urinary urgency    Past Surgical History:  Procedure Laterality Date   ABDOMINAL HYSTERECTOMY  1194   APPLICATION OF A-CELL OF EXTREMITY Right 12/10/2014   Procedure: APPLICATION OF A-CELL OF EXTREMITY;  Surgeon: Theodoro Kos, DO;  Location: [place];  Service: Plastics;  Laterality: Right;   arthroscopic knee Right    x 2   BACK SURGERY  2000   spinal fusion   BILATERAL SALPINGECTOMY Bilateral 1989   Dr. Marianna Fuss   CARDIAC CATHETERIZATION     EYE SURGERY     cataracts bilateral - removed   HERNIA REPAIR Bilateral  inguinal    I & D EXTREMITY Right 12/10/2014   Procedure: IRRIGATION AND DEBRIDEMENT ON RIGHT LEG, ;  Surgeon: Theodoro Kos, DO;  Location: [place];  Service: Plastics;  Laterality: Right;   I & D EXTREMITY Right 01/20/2015   Procedure: IRRIGATION AND DEBRIDEMENT RIGHT LOWER LEG WOUND, with ACELL to Donor Site, SKIN GRAFT AND VAC Placement;  Surgeon: Theodoro Kos, DO;  Location: [place];  Service: Plastics;  Laterality: Right;   LUMBAR LAMINECTOMY  02-17-1999   with sinal fusion L3,4,5,&S1   moes     moses surgery on R lle    OOPHORECTOMY  1989   RESECTION DISTAL CLAVICAL     ROTATOR CUFF REPAIR Right 2001   x2   SKIN GRAFT Right 05/29/2018   THYROIDECTOMY     TONSILLECTOMY  1928   TUBAL LIGATION      Allergies  Allergen Reactions   Naproxen     Gi bleed   Nsaids     GI BLEED   Aspirin     GI bleed   Ace Inhibitors     Cough    Caffeine Other (See Comments)    Causes joints to be painful    Allergies as of 05/13/2021       Reactions   Naproxen    Gi bleed   Nsaids    GI BLEED   Aspirin    GI bleed   Ace Inhibitors    Cough   Caffeine Other (See Comments)   Causes joints to be painful        Medication List        Accurate as of May 13, 2021 11:31 AM. If you have any questions, ask your nurse or doctor.          acetaminophen 500 MG tablet Commonly known as: TYLENOL Take 2 tablets (1,000 mg total) by mouth 3 (three) times daily.   DULoxetine 20 MG capsule Commonly known as: CYMBALTA Take 1 capsule (20 mg total) by mouth daily.   Evenity 105 MG/1.17ML Sosy injection Generic drug: Romosozumab-aqqg Inject 210 mg into the skin once.   lidocaine 5 % Commonly known as: Lidoderm Place 1 patch onto the skin daily. Remove & Discard patch within 12 hours or as directed by MD   losartan 50 MG tablet Commonly known as: COZAAR Take 1 tablet (50 mg total) by mouth daily.   Moderna COVID-19 Vaccine 100 MCG/0.5ML injection Generic drug: COVID-19 mRNA vaccine (Moderna) Inject into the muscle.   Omega-3 1000 MG Caps Take one capsule by mouth three times daily.   oxyCODONE 5 MG immediate release tablet Commonly known as: Oxy IR/ROXICODONE Take 2.5 mg by mouth 2 (two) times daily as needed for severe pain. Take 1/2 tab PO BID PRN for pain x 14 days   polyethylene glycol powder 17 GM/SCOOP powder Commonly known as: GLYCOLAX/MIRALAX Take 17 g by mouth daily as needed.   Pregnenolone 50 MG Tabs Take 1 tablet by mouth every morning.   PRESERVISION AREDS 2+MULTI VIT  PO Take by mouth.   sennosides-docusate sodium 8.6-50 MG tablet Commonly known as: SENOKOT-S Take 2 tablets by mouth daily.   UNABLE TO FIND Life extension multi vitamin 2 daily   vitamin C 1000 MG tablet Take 1,000 mg by mouth daily.   Vitamin D3 50 MCG (2000 UT) Tabs Take 1 tablet by mouth daily.        Review of Systems  Constitutional:  Positive for activity change. Negative for appetite  change, chills, diaphoresis, fatigue, fever and unexpected weight change.  HENT:  Negative for congestion.   Respiratory:  Negative for cough, shortness of breath and wheezing.   Cardiovascular:  Negative for chest pain, palpitations and leg swelling.       Left rib pain, also has left scapular pain at times  Gastrointestinal:  Negative for abdominal distention, abdominal pain, constipation and diarrhea.  Genitourinary:  Negative for difficulty urinating and dysuria.  Musculoskeletal:  Positive for gait problem. Negative for arthralgias, back pain, joint swelling and myalgias.  Neurological:  Negative for dizziness, tremors, seizures, syncope, facial asymmetry, speech difficulty, weakness, light-headedness, numbness and headaches.  Psychiatric/Behavioral:  Negative for agitation, behavioral problems and confusion (mild memory problems).    Immunization History  Administered Date(s) Administered   Fluad Quad(high Dose 65+) 04/05/2020   Influenza Split 03/08/2011, 03/05/2012   Influenza Whole 03/08/2007, 03/02/2009, 03/29/2010   Influenza, High Dose Seasonal PF 03/21/2013, 06/13/2017, 03/07/2019, 03/26/2020   Influenza,inj,Quad PF,6+ Mos 02/20/2014, 02/11/2015, 03/22/2018   Influenza-Unspecified 03/23/2016, 04/05/2020   Moderna SARS-COV2 Booster Vaccination 09/20/2020   Moderna Sars-Covid-2 Vaccination 06/10/2019, 07/09/2019   PFIZER(Purple Top)SARS-COV-2 Vaccination 04/05/2020   Pneumococcal Conjugate-13 07/30/2014   Pneumococcal Polysaccharide-23 02/03/2002, 12/16/2009   Td 02/03/2002    Tdap 12/11/2012   Pertinent  Health Maintenance Due  Topic Date Due   INFLUENZA VACCINE  12/27/2020   DEXA SCAN  Completed   Fall Risk 01/28/2020 04/06/2020 05/19/2020 09/15/2020 12/29/2020  Falls in the past year? 0 1 1 0 0  Was there an injury with Fall? 0 0 0 1 -  Was there an injury with Fall? - - - - -  Fall Risk Category Calculator 0 2 1 - -  Fall Risk Category Low Moderate Low - -  Patient Fall Risk Level Low fall risk Moderate fall risk Moderate fall risk - Low fall risk  Patient Fall Risk Level - Patient fell while getting dressed in closet. - - -  Fall risk Follow up - - - - Falls evaluation completed   Functional Status Survey:    Vitals:   05/13/21 0841  BP: (!) 164/89  Pulse: 77  Resp: 17  Temp: (!) 97.5 F (36.4 C)  SpO2: 96%  Weight: 120 lb 12.8 oz (54.8 kg)  Height: 5' (1.524 m)   Body mass index is 23.59 kg/m. Physical Exam Vitals and nursing note reviewed.  Constitutional:      General: She is not in acute distress.    Appearance: She is not diaphoretic.  HENT:     Head: Normocephalic and atraumatic.  Neck:     Vascular: No JVD.  Cardiovascular:     Rate and Rhythm: Normal rate and regular rhythm.     Heart sounds: No murmur heard. Pulmonary:     Effort: Pulmonary effort is normal. No respiratory distress.     Breath sounds: Normal breath sounds. No wheezing.  Chest:     Chest wall: No tenderness.  Abdominal:     General: Bowel sounds are normal. There is no distension.     Palpations: Abdomen is soft.     Tenderness: There is no abdominal tenderness.     Hernia: A hernia (umbilical not swollen or tender.) is present.  Musculoskeletal:     Right lower leg: No edema.     Left lower leg: No edema.  Skin:    General: Skin is warm and dry.  Neurological:     Mental Status: She is alert and oriented to person,  place, and time.  Psychiatric:        Mood and Affect: Mood normal.    Labs reviewed: Recent Labs    12/23/20 0000  NA 141  K  4.0  CL 102  CO2 24*  BUN 23*  CREATININE 0.6  CALCIUM 9.3   Recent Labs    12/23/20 0000  AST 22  ALT 13  ALKPHOS 73  ALBUMIN 4.4   Recent Labs    12/23/20 0000  WBC 3.8  HGB 11.1*  HCT 33*  PLT 152   Lab Results  Component Value Date   TSH 2.79 12/23/2020   No results found for: HGBA1C Lab Results  Component Value Date   CHOL 238 (A) 08/20/2018   HDL 67 08/20/2018   LDLCALC 154 08/20/2018   LDLDIRECT 118.7 07/20/2011   TRIG 85 08/20/2018   CHOLHDL 4 12/30/2013    Significant Diagnostic Results in last 30 days:  No results found.  Assessment/Plan  1. Closed fracture of multiple ribs of left side with routine healing, subsequent encounter She has had some improvement with pain and mobility She should continue the IS Continue oxy for 14 more days (small dose prn) and then would not resume No signs of pna  2. Umbilical hernia without obstruction and without gangrene She is concerned about this and wants to talk with Dr. Lyndel Safe. At this time she is not in pain and I let her know due to her age there is not much we can do.  3. Localized osteoporosis with current pathological fracture with routine healing, subsequent encounter Continue Evenity.    4. Drug-induced constipation Improved Continue senokot and prn miralax   Family/ staff Communication:   Labs/tests ordered:   NA

## 2021-05-16 DIAGNOSIS — H353112 Nonexudative age-related macular degeneration, right eye, intermediate dry stage: Secondary | ICD-10-CM | POA: Diagnosis not present

## 2021-05-16 DIAGNOSIS — H353221 Exudative age-related macular degeneration, left eye, with active choroidal neovascularization: Secondary | ICD-10-CM | POA: Diagnosis not present

## 2021-05-16 DIAGNOSIS — H43813 Vitreous degeneration, bilateral: Secondary | ICD-10-CM | POA: Diagnosis not present

## 2021-05-16 DIAGNOSIS — H16143 Punctate keratitis, bilateral: Secondary | ICD-10-CM | POA: Diagnosis not present

## 2021-05-18 DIAGNOSIS — S81802A Unspecified open wound, left lower leg, initial encounter: Secondary | ICD-10-CM | POA: Diagnosis not present

## 2021-06-01 ENCOUNTER — Encounter: Payer: Medicare HMO | Admitting: Internal Medicine

## 2021-06-03 DIAGNOSIS — S81802A Unspecified open wound, left lower leg, initial encounter: Secondary | ICD-10-CM | POA: Diagnosis not present

## 2021-06-06 ENCOUNTER — Ambulatory Visit: Payer: Medicare HMO | Admitting: Orthopedic Surgery

## 2021-06-15 ENCOUNTER — Encounter: Payer: Self-pay | Admitting: Internal Medicine

## 2021-06-15 ENCOUNTER — Other Ambulatory Visit: Payer: Self-pay

## 2021-06-15 ENCOUNTER — Non-Acute Institutional Stay: Payer: Medicare HMO | Admitting: Internal Medicine

## 2021-06-15 VITALS — BP 154/96 | HR 90 | Temp 97.7°F | Ht 60.0 in | Wt 123.0 lb

## 2021-06-15 DIAGNOSIS — S2242XD Multiple fractures of ribs, left side, subsequent encounter for fracture with routine healing: Secondary | ICD-10-CM | POA: Diagnosis not present

## 2021-06-15 DIAGNOSIS — K429 Umbilical hernia without obstruction or gangrene: Secondary | ICD-10-CM | POA: Diagnosis not present

## 2021-06-15 DIAGNOSIS — M159 Polyosteoarthritis, unspecified: Secondary | ICD-10-CM | POA: Diagnosis not present

## 2021-06-15 DIAGNOSIS — M8080XD Other osteoporosis with current pathological fracture, unspecified site, subsequent encounter for fracture with routine healing: Secondary | ICD-10-CM

## 2021-06-15 DIAGNOSIS — G3184 Mild cognitive impairment, so stated: Secondary | ICD-10-CM | POA: Diagnosis not present

## 2021-06-15 DIAGNOSIS — R296 Repeated falls: Secondary | ICD-10-CM | POA: Diagnosis not present

## 2021-06-15 DIAGNOSIS — I10 Essential (primary) hypertension: Secondary | ICD-10-CM

## 2021-06-15 NOTE — Progress Notes (Signed)
Location:  [place] of Service:  Clinic (12)  Provider:   Code Status: DNR Goals of Care:  Advanced Directives 06/15/2021  Does Patient Have a Medical Advance Directive? Yes  Type of Advance Directive Living will;Out of facility DNR (pink MOST or yellow form)  Does patient want to make changes to medical advance directive? No - Patient declined  Copy of [place] in Chart? -  Pre-existing out of facility DNR order (yellow form or pink MOST form) Yellow form placed in chart (order not valid for inpatient use)     Chief Complaint  Patient presents with   Medical Management of Chronic Issues    Patient returns to the clinic for her 4 month follow up.     HPI: Patient is a 88 y.o. female seen today for medical management of chronic diseases.    Patient has h/o Osteoporosis ON Evenity since June 21 T score of -4.5 in 11/20 H/o Trochanteric bursitis s/P Steroid Shots  Osteoarthritis specially Left Knee s/p Steroid shots by Dr Marlou Sa Depression   Now is in [place] Sustained Rib fractures in 6,7,and 8 left side in 11/22 No Falls Got Tylenol and Oxycodone for few days Has had few falls since then She states that now she does not feel confident of walking  Umbilical hernia Wants me to examine it and make sure there is nothing else we can do for it Non healing Wound in Left knee after Biopsy for cancer Knee pain is better [facility] Repeats herself a lot and struggles with words  Walks with her walker. Adjusted to AL Weight stable No Complains from Nurses Past Medical History:  Diagnosis Date   Arthritis    "all over"   BACK PAIN 03/08/2007   Cancer ([place])    squamous cell on R leg & face- ?basal cell   DEPRESSIVE DISORDER 12/01/2008   Duodenal ulcer    GI bleed 09/12/2010   Naproxen induced Endoscopy with dr Harlene Ramus    HIATAL HERNIA 12/17/2006   History of hiatal hernia    HYPERLIPIDEMIA 03/09/2006   HYPERTENSION 03/09/2006   KNEE  PAIN 05/06/2009   Macular degeneration    Pneumonia    63yrs. ago- hosp. pneumonia   Urinary urgency     Past Surgical History:  Procedure Laterality Date   ABDOMINAL HYSTERECTOMY  4008   APPLICATION OF A-CELL OF EXTREMITY Right 12/10/2014   Procedure: APPLICATION OF A-CELL OF EXTREMITY;  Surgeon: Theodoro Kos, DO;  Location: [place];  Service: Plastics;  Laterality: Right;   arthroscopic knee Right    x 2   BACK SURGERY  2000   spinal fusion   BILATERAL SALPINGECTOMY Bilateral 1989   Dr. Marianna Fuss   CARDIAC CATHETERIZATION     EYE SURGERY     cataracts bilateral - removed   HERNIA REPAIR Bilateral    inguinal    I & D EXTREMITY Right 12/10/2014   Procedure: IRRIGATION AND DEBRIDEMENT ON RIGHT LEG, ;  Surgeon: Theodoro Kos, DO;  Location: [place];  Service: Plastics;  Laterality: Right;   I & D EXTREMITY Right 01/20/2015   Procedure: IRRIGATION AND DEBRIDEMENT RIGHT LOWER LEG WOUND, with ACELL to Donor Site, SKIN GRAFT AND VAC Placement;  Surgeon: Theodoro Kos, DO;  Location: [place];  Service: Plastics;  Laterality: Right;   LUMBAR LAMINECTOMY  02-17-1999   with sinal fusion L3,4,5,&S1   moes     moses surgery on R lle   OOPHORECTOMY  1989   RESECTION DISTAL CLAVICAL     ROTATOR CUFF REPAIR Right 2001   x2   SKIN GRAFT Right 05/29/2018   THYROIDECTOMY     TONSILLECTOMY  1928   TUBAL LIGATION      Allergies  Allergen Reactions   Naproxen     Gi bleed   Nsaids     GI BLEED   Aspirin     GI bleed   Ace Inhibitors     Cough    Caffeine Other (See Comments)    Causes joints to be painful    Outpatient Encounter Medications as of 06/15/2021  Medication Sig   acetaminophen (TYLENOL) 500 MG tablet Take 2 tablets (1,000 mg total) by mouth 3 (three) times daily.   Ascorbic Acid (VITAMIN C) 1000 MG tablet Take 1,000 mg by mouth daily.   Cholecalciferol (VITAMIN D3) 50 MCG (2000 UT) TABS Take 1 tablet by mouth daily.   COVID-19 mRNA vaccine, Moderna, 100 MCG/0.5ML injection  Inject into the muscle.   DULoxetine (CYMBALTA) 20 MG capsule Take 1 capsule (20 mg total) by mouth daily.   lidocaine (LIDODERM) 5 % Place 1 patch onto the skin daily. Remove & Discard patch within 12 hours or as directed by MD   losartan (COZAAR) 50 MG tablet Take 1 tablet (50 mg total) by mouth daily.   Multiple Vitamins-Minerals (PRESERVISION AREDS 2+MULTI VIT PO) Take by mouth.   Omega-3 1000 MG CAPS Take one capsule by mouth three times daily.   polyethylene glycol powder (GLYCOLAX/MIRALAX) 17 GM/SCOOP powder Take 17 g by mouth daily as needed.   Pregnenolone 50 MG TABS Take 1 tablet by mouth every morning.   Romosozumab-aqqg (EVENITY) 105 MG/1.17ML SOSY injection Inject 210 mg into the skin once.   sennosides-docusate sodium (SENOKOT-S) 8.6-50 MG tablet Take 2 tablets by mouth daily.   UNABLE TO FIND Life extension multi vitamin 2 daily   No facility-administered encounter medications on file as of 06/15/2021.    Review of Systems:  Review of Systems  Constitutional:  Negative for activity change and appetite change.  HENT: Negative.    Respiratory:  Negative for cough and shortness of breath.   Cardiovascular:  Negative for leg swelling.  Gastrointestinal:  Negative for constipation.  Genitourinary: Negative.   Musculoskeletal:  Positive for gait problem. Negative for arthralgias and myalgias.  Skin:  Positive for wound.  Neurological:  Negative for dizziness and weakness.  Psychiatric/Behavioral:  Positive for confusion. Negative for dysphoric mood and sleep disturbance.    Health Maintenance  Topic Date Due   Zoster Vaccines- Shingrix (1 of 2) Never done   COVID-19 Vaccine (4 - Booster) 11/15/2020   INFLUENZA VACCINE  12/27/2020   TETANUS/TDAP  12/12/2022   Pneumonia Vaccine 44+ Years old  Completed   DEXA SCAN  Completed   HPV VACCINES  Aged Out    Physical Exam: Vitals:   06/15/21 0853  BP: (!) 154/96  Pulse: 90  Temp: 97.7 F (36.5 C)  SpO2: 96%  Weight: 123  lb (55.8 kg)  Height: 5' (1.524 m)   Body mass index is 24.02 kg/m. Physical Exam Vitals reviewed.  Constitutional:      Appearance: Normal appearance.  HENT:     Head: Normocephalic.     Nose: Nose normal.     Mouth/Throat:     Mouth: Mucous membranes are moist.     Pharynx: Oropharynx is clear.  Eyes:     Pupils: Pupils are equal, round, and reactive  to light.  Cardiovascular:     Rate and Rhythm: Normal rate and regular rhythm.     Pulses: Normal pulses.     Heart sounds: Normal heart sounds. No murmur heard. Pulmonary:     Effort: Pulmonary effort is normal.     Breath sounds: Normal breath sounds.  Abdominal:     General: Abdomen is flat. Bowel sounds are normal.     Palpations: Abdomen is soft.     Comments: Umbilical hernia easily reducible and no pain  Musculoskeletal:        General: No swelling.     Cervical back: Neck supple.  Skin:    General: Skin is warm.     Comments: Wound in left LE small with Clear base and edges No signs of infection  Neurological:     General: No focal deficit present.     Mental Status: She is alert and oriented to person, place, and time.  Psychiatric:        Mood and Affect: Mood normal.        Thought Content: Thought content normal.    Labs reviewed: Basic Metabolic Panel: Recent Labs    12/23/20 0000  NA 141  K 4.0  CL 102  CO2 24*  BUN 23*  CREATININE 0.6  CALCIUM 9.3  TSH 2.79   Liver Function Tests: Recent Labs    12/23/20 0000  AST 22  ALT 13  ALKPHOS 73  ALBUMIN 4.4   No results for input(s): LIPASE, AMYLASE in the last 8760 hours. No results for input(s): AMMONIA in the last 8760 hours. CBC: Recent Labs    12/23/20 0000  WBC 3.8  HGB 11.1*  HCT 33*  PLT 152   Lipid Panel: No results for input(s): CHOL, HDL, LDLCALC, TRIG, CHOLHDL, LDLDIRECT in the last 8760 hours. No results found for: HGBA1C  Procedures since last visit: No results found.  Assessment/Plan Recurrent falls Continue to  walk with her walker Made therapy Closed fracture of multiple ribs of left side with routine healing,  Pain Controlled Off Oxycodone Want tylenol PRn Umbilical hernia without obstruction and without gangrene Reassured Localized osteoporosis with current pathological fracture with routine healing,  Will soon start Prolia in our office Primary osteoarthritis involving multiple joints Doing well with her knee since her Injection [facility] MMSE 26 Passed her clock Now in [place] Elevated BP Check it QD for few days in [place] On Cozaar Depression Doing well on cymbalta   Labs/tests ordered:  * No order type specified * Next appt:  08/30/2021

## 2021-06-20 ENCOUNTER — Other Ambulatory Visit: Payer: Self-pay

## 2021-06-20 ENCOUNTER — Encounter: Payer: Self-pay | Admitting: Orthopedic Surgery

## 2021-06-20 ENCOUNTER — Ambulatory Visit (INDEPENDENT_AMBULATORY_CARE_PROVIDER_SITE_OTHER): Payer: Medicare HMO | Admitting: Orthopedic Surgery

## 2021-06-20 ENCOUNTER — Ambulatory Visit (INDEPENDENT_AMBULATORY_CARE_PROVIDER_SITE_OTHER): Payer: Medicare HMO

## 2021-06-20 ENCOUNTER — Telehealth: Payer: Self-pay

## 2021-06-20 DIAGNOSIS — G8929 Other chronic pain: Secondary | ICD-10-CM

## 2021-06-20 DIAGNOSIS — M1712 Unilateral primary osteoarthritis, left knee: Secondary | ICD-10-CM

## 2021-06-20 DIAGNOSIS — M25562 Pain in left knee: Secondary | ICD-10-CM

## 2021-06-20 MED ORDER — LIDOCAINE HCL 1 % IJ SOLN
5.0000 mL | INTRAMUSCULAR | Status: AC | PRN
  Administered 2021-06-20: 5 mL

## 2021-06-20 MED ORDER — BUPIVACAINE HCL 0.25 % IJ SOLN
4.0000 mL | INTRAMUSCULAR | Status: AC | PRN
  Administered 2021-06-20: 4 mL via INTRA_ARTICULAR

## 2021-06-20 MED ORDER — METHYLPREDNISOLONE ACETATE 40 MG/ML IJ SUSP
40.0000 mg | INTRAMUSCULAR | Status: AC | PRN
  Administered 2021-06-20: 40 mg via INTRA_ARTICULAR

## 2021-06-20 NOTE — Telephone Encounter (Signed)
Noted  

## 2021-06-20 NOTE — Telephone Encounter (Signed)
Auth needed for left knee gel injection  

## 2021-06-20 NOTE — Progress Notes (Signed)
Office Visit Note   Patient: Vanessa Mason           Date of Birth: [birth year]           MRN: [id number] Visit Date: 06/20/2021 Requested by: Virgie Dad, MD [place],  [place] [id number] PCP: Virgie Dad, MD  Subjective: Chief Complaint  Patient presents with   Left Knee - Follow-up   Left Hip - Follow-up    HPI: Patient presents for evaluation of left hip and knee pain.  Old notes are reviewed.  She had left trochanteric injection 02/02/2021 and left knee injection 02/02/2021.  She states the pain is not much better.  Tylenol helps her symptoms.  She is unable to walk without the walker because she feels unsafe.  She reports some weakness in that left knee.  She is fallen several times with rib fractures.              ROS: All systems reviewed are negative as they relate to the chief complaint within the history of present illness.  Patient denies  fevers or chills.   Assessment & Plan: Visit Diagnoses:  1. Chronic pain of left knee     Plan: Impression is end-stage left knee arthritis.  Plan is injection in the left knee today with cortisone.  We will preapproved her for gel injection.  Not really candidate for knee replacement.  No groin pain today with internal/external rotation of either leg.  No trochanteric tenderness today.  Follow-up in 3 months for scheduled gel injection in the left knee. This patient is diagnosed with osteoarthritis of the knee(s).    Radiographs show evidence of joint space narrowing, osteophytes, subchondral sclerosis and/or subchondral cysts.  This patient has knee pain which interferes with functional and activities of daily living.    This patient has experienced inadequate response, adverse effects and/or intolerance with conservative treatments such as acetaminophen, NSAIDS, topical creams, physical therapy or regular exercise, knee bracing and/or weight loss.   This patient has experienced inadequate response or has a  contraindication to intra articular steroid injections for at least 3 months.   This patient is not scheduled to have a total knee replacement within 6 months of starting treatment with viscosupplementation.   Follow-Up Instructions: No follow-ups on file.   Orders:  Orders Placed This Encounter  Procedures   XR Knee 1-2 Views Left   No orders of the defined types were placed in this encounter.     Procedures: Large Joint Inj: L knee on 06/20/2021 10:11 PM Indications: diagnostic evaluation, joint swelling and pain Details: 18 G 1.5 in needle, superolateral approach  Arthrogram: No  Medications: 5 mL lidocaine 1 %; 40 mg methylPREDNISolone acetate 40 MG/ML; 4 mL bupivacaine 0.25 % Outcome: tolerated well, no immediate complications Procedure, treatment alternatives, risks and benefits explained, specific risks discussed. Consent was given by the patient. Immediately prior to procedure a time out was called to verify the correct patient, procedure, equipment, support staff and site/side marked as required. Patient was prepped and draped in the usual sterile fashion.      Clinical Data: No additional findings.  Objective: Vital Signs: There were no vitals taken for this visit.  Physical Exam:   Constitutional: Patient appears well-developed HEENT:  Head: Normocephalic Eyes:EOM are normal Neck: Normal range of motion Cardiovascular: Normal rate Pulmonary/chest: Effort normal Neurologic: Patient is alert Skin: Skin is warm Psychiatric: Patient has normal mood and affect   Ortho Exam:  Ortho exam demonstrates full active and passive range of motion of the left knee.  She has full extension and flexion to about 120.  No effusion.  Slight valgus alignment present.  No groin pain with internal or external rotation of either leg.  No trochanteric tenderness on the left.  Hip flexion strength is 5+ out of 5 bilaterally.  Pedal pulses palpable bilaterally.  Specialty Comments:   No specialty comments available.  Imaging: XR Knee 1-2 Views Left  Result Date: 06/20/2021 AP lateral radiographs left knee reviewed.  Severe end-stage tricompartmental arthritis is present with slight valgus alignment.  Changes are worse in the lateral compartment.  No acute fracture.    PMFS History: Patient Active Problem List   Diagnosis Date Noted   Major depressive disorder with single episode, in full remission ([place]) 07/28/2020   Trochanteric bursitis of left hip 04/09/2019   Word finding difficulty 04/09/2019   Mild cognitive impairment with memory loss 04/09/2019   Senile osteoporosis 12/27/2018   Chronic constipation 12/11/2014   Depression 06/18/2014   Actinic keratosis 02/10/2014   History of partial thyroidectomy 12/30/2013   Rash and nonspecific skin eruption 12/30/2013   DNR (do not resuscitate) 12/16/2013   LBBB (left bundle branch block) 07/30/2013   Osteoarthritis of left knee 06/17/2013   Hyperlipemia 09/12/2010   BACK PAIN 03/08/2007   HIATAL HERNIA 12/17/2006   Essential hypertension 03/09/2006   Past Medical History:  Diagnosis Date   Arthritis    "all over"   BACK PAIN 03/08/2007   Cancer ([place])    squamous cell on R leg & face- ?basal cell   DEPRESSIVE DISORDER 12/01/2008   Duodenal ulcer    GI bleed 09/12/2010   Naproxen induced Endoscopy with dr Harlene Ramus    HIATAL HERNIA 12/17/2006   History of hiatal hernia    HYPERLIPIDEMIA 03/09/2006   HYPERTENSION 03/09/2006   KNEE PAIN 05/06/2009   Macular degeneration    Pneumonia    52yrs. ago- hosp. pneumonia   Urinary urgency     Family History  Problem Relation Age of Onset   Cancer Mother 41   Cancer Brother     Past Surgical History:  Procedure Laterality Date   ABDOMINAL HYSTERECTOMY  6834   APPLICATION OF A-CELL OF EXTREMITY Right 12/10/2014   Procedure: APPLICATION OF A-CELL OF EXTREMITY;  Surgeon: Theodoro Kos, DO;  Location: [facility];  Service: Plastics;  Laterality: Right;   arthroscopic  knee Right    x 2   BACK SURGERY  2000   spinal fusion   BILATERAL SALPINGECTOMY Bilateral 1989   Dr. Marianna Fuss   CARDIAC CATHETERIZATION     EYE SURGERY     cataracts bilateral - removed   HERNIA REPAIR Bilateral    inguinal    I & D EXTREMITY Right 12/10/2014   Procedure: IRRIGATION AND DEBRIDEMENT ON RIGHT LEG, ;  Surgeon: Theodoro Kos, DO;  Location: [facility];  Service: Plastics;  Laterality: Right;   I & D EXTREMITY Right 01/20/2015   Procedure: IRRIGATION AND DEBRIDEMENT RIGHT LOWER LEG WOUND, with ACELL to Donor Site, SKIN GRAFT AND VAC Placement;  Surgeon: Theodoro Kos, DO;  Location: [facility];  Service: Plastics;  Laterality: Right;   LUMBAR LAMINECTOMY  02-17-1999   with sinal fusion L3,4,5,&S1   moes     moses surgery on R lle   OOPHORECTOMY  1989   RESECTION DISTAL CLAVICAL     ROTATOR CUFF REPAIR Right 2001   x2   SKIN GRAFT  Right 05/29/2018   THYROIDECTOMY     TONSILLECTOMY  1928   TUBAL LIGATION     Social History   Occupational History   Not on file  Tobacco Use   Smoking status: Former    Years: 16.00    Types: Cigarettes   Smokeless tobacco: Never   Tobacco comments:    Quit at age 27  Vaping Use   Vaping Use: Never used  Substance and Sexual Activity   Alcohol use: Yes    Alcohol/week: 7.0 standard drinks    Types: 7 Glasses of wine per week    Comment: wine occasionally @ dinner   Drug use: No   Sexual activity: Not on file

## 2021-07-01 ENCOUNTER — Other Ambulatory Visit: Payer: Self-pay | Admitting: Adult Health

## 2021-07-01 ENCOUNTER — Non-Acute Institutional Stay: Payer: Medicare HMO | Admitting: Adult Health

## 2021-07-01 ENCOUNTER — Encounter: Payer: Self-pay | Admitting: Adult Health

## 2021-07-01 ENCOUNTER — Telehealth: Payer: Self-pay

## 2021-07-01 DIAGNOSIS — M810 Age-related osteoporosis without current pathological fracture: Secondary | ICD-10-CM | POA: Diagnosis not present

## 2021-07-01 DIAGNOSIS — I10 Essential (primary) hypertension: Secondary | ICD-10-CM | POA: Diagnosis not present

## 2021-07-01 DIAGNOSIS — M545 Low back pain, unspecified: Secondary | ICD-10-CM

## 2021-07-01 DIAGNOSIS — M546 Pain in thoracic spine: Secondary | ICD-10-CM | POA: Diagnosis not present

## 2021-07-01 DIAGNOSIS — M533 Sacrococcygeal disorders, not elsewhere classified: Secondary | ICD-10-CM | POA: Diagnosis not present

## 2021-07-01 MED ORDER — TRAMADOL HCL 50 MG PO TABS: 25.0000 mg | ORAL_TABLET | Freq: Four times a day (QID) | ORAL | 0 refills | Status: AC | PRN

## 2021-07-01 NOTE — Progress Notes (Addendum)
Location:   [facility] Room Number: 220 Place of Service:  ALF [phone]) Provider:  Royal Hawthorn, NP  Virgie Dad, MD  Patient Care Team: Virgie Dad, MD as PCP - General (Internal Medicine) Ronald Lobo, MD as Consulting Physician (Gastroenterology) Vickey Huger, MD as Consulting Physician (Orthopedic Surgery) Izora Ribas as Consulting Physician (Dermatology) Darlin Coco, MD as Consulting Physician (Cardiology) Luberta Mutter, MD as Consulting Physician (Ophthalmology) Karin Golden, MD as Referring Physician (Dermatology)  Extended Emergency Contact Information Primary Emergency Contact: Clearence Ped of [place] Phone: [phone] Relation: Daughter Secondary Emergency Contact: [facility] Address: [place] [address] Johnnette Litter of [place] Phone: [phone] Mobile Phone: [phone] Relation: Son  Code Status:  DNR Goals of care: Advanced Directive information Advanced Directives 07/01/2021  Does Patient Have a Medical Advance Directive? Yes  Type of Advance Directive Living will;Out of facility DNR (pink MOST or yellow form)  Does patient want to make changes to medical advance directive? No - Patient declined  Copy of [place] in Chart? -  Pre-existing out of facility DNR order (yellow form or pink MOST form) Yellow form placed in chart (order not valid for inpatient use)     Chief Complaint  Patient presents with   Acute Visit    rib pain    HPI:  Pt is a 88 y.o. female seen today for an acute visit for rib and back pain. She is 89 with some memory loss and various joints pain with osteoarthritis and DDD. When I entered the room she felt her low back pain was more of an issue and that her rib pain from prior fractures had subsided. She saw ortho last week for left knee and had a joint injection. From the notes it looks like they are  going to try gel injections once approved. She has mid to low back pain that does not radiate or cause numbness. No bowel or bladder changes. She has osteoporosis and as on evenity and will start Prolia in April. She has more frequent falls and was referred to therapy in Jan for this reason. They can not correlate a fall with her injury. She says her pain has been present for weeks, but she can remember for sure. She is taking tylenol and has tried lidocaine patches but doesn't like them. She feels her pain meds "are taken away from her too quickly" since last time she had rib pain with fractures she was on oxycodone for a period of time and then tapered.   Past Medical History:  Diagnosis Date   Arthritis    "all over"   BACK PAIN 03/08/2007   Cancer ([place])    squamous cell on R leg & face- ?basal cell   DEPRESSIVE DISORDER 12/01/2008   Duodenal ulcer    GI bleed 09/12/2010   Naproxen induced Endoscopy with dr Harlene Ramus    HIATAL HERNIA 12/17/2006   History of hiatal hernia    HYPERLIPIDEMIA 03/09/2006   HYPERTENSION 03/09/2006   KNEE PAIN 05/06/2009   Macular degeneration    Pneumonia    69yrs. ago- hosp. pneumonia   Urinary urgency    Past Surgical History:  Procedure Laterality Date   ABDOMINAL HYSTERECTOMY  7035   APPLICATION OF A-CELL OF EXTREMITY Right 12/10/2014   Procedure: APPLICATION OF A-CELL OF EXTREMITY;  Surgeon: Theodoro Kos, DO;  Location: [facility];  Service: Plastics;  Laterality: Right;   arthroscopic knee Right    x 2   BACK SURGERY  2000   spinal fusion   BILATERAL SALPINGECTOMY Bilateral 1989   Dr. Marianna Fuss   CARDIAC CATHETERIZATION     EYE SURGERY     cataracts bilateral - removed   HERNIA REPAIR Bilateral    inguinal    I & D EXTREMITY Right 12/10/2014   Procedure: IRRIGATION AND DEBRIDEMENT ON RIGHT LEG, ;  Surgeon: Theodoro Kos, DO;  Location: [facility];  Service: Plastics;  Laterality: Right;   I & D EXTREMITY Right 01/20/2015   Procedure: IRRIGATION AND DEBRIDEMENT  RIGHT LOWER LEG WOUND, with ACELL to Donor Site, SKIN GRAFT AND VAC Placement;  Surgeon: Theodoro Kos, DO;  Location: [facility];  Service: Plastics;  Laterality: Right;   LUMBAR LAMINECTOMY  02-17-1999   with sinal fusion L3,4,5,&S1   [facility]     moses surgery on R lle   OOPHORECTOMY  1989   RESECTION DISTAL CLAVICAL     ROTATOR CUFF REPAIR Right 2001   x2   SKIN GRAFT Right 05/29/2018   THYROIDECTOMY     TONSILLECTOMY  1928   TUBAL LIGATION      Allergies  Allergen Reactions   Naproxen     Gi bleed   Nsaids     GI BLEED   Aspirin     GI bleed   Ace Inhibitors     Cough    Caffeine Other (See Comments)    Causes joints to be painful    Allergies as of 07/01/2021       Reactions   Naproxen    Gi bleed   Nsaids    GI BLEED   Aspirin    GI bleed   Ace Inhibitors    Cough   Caffeine Other (See Comments)   Causes joints to be painful        Medication List        Accurate as of July 01, 2021  2:25 PM. If you have any questions, ask your nurse or doctor.          acetaminophen 500 MG tablet Commonly known as: TYLENOL Take 2 tablets (1,000 mg total) by mouth 3 (three) times daily. What changed:  when to take this reasons to take this   DULoxetine 20 MG capsule Commonly known as: CYMBALTA Take 1 capsule (20 mg total) by mouth daily.   losartan 50 MG tablet Commonly known as: COZAAR Take 1 tablet (50 mg total) by mouth daily.   Moderna COVID-19 Vaccine 100 MCG/0.5ML injection Generic drug: COVID-19 mRNA vaccine (Moderna) Inject into the muscle.   Omega-3 1000 MG Caps Take one capsule by mouth three times daily.   polyethylene glycol powder 17 GM/SCOOP powder Commonly known as: GLYCOLAX/MIRALAX Take 17 g by mouth daily as needed.   Pregnenolone 50 MG Tabs Take 1 tablet by mouth every morning.   PRESERVISION AREDS 2+MULTI VIT PO Take by mouth 2 (two) times daily.   sennosides-docusate sodium 8.6-50 MG tablet Commonly known as: SENOKOT-S Take 2  tablets by mouth daily.   Theratears 0.25 % Soln Generic drug: Carboxymethylcellulose Sodium Apply 2-3 drops to eye in the morning, at noon, in the evening, and at bedtime.   traMADol 50 MG tablet Commonly known as: ULTRAM Take 0.5 tablets (25 mg total) by mouth every 6 (six) hours as needed for up to 14 days. Started by: Royal Hawthorn, NP   UNABLE TO FIND Life extension multi  vitamin 2 daily   vitamin C 1000 MG tablet Take 1,000 mg by mouth daily.   Vitamin D3 50 MCG (2000 UT) Tabs Take 1 tablet by mouth daily.        Review of Systems  Constitutional:  Negative for activity change, appetite change, chills, diaphoresis, fatigue, fever and unexpected weight change.  HENT:  Negative for congestion.   Respiratory:  Negative for cough, shortness of breath and wheezing.   Cardiovascular:  Negative for chest pain, palpitations and leg swelling.  Gastrointestinal:  Negative for abdominal distention, abdominal pain, constipation and diarrhea.  Genitourinary:  Negative for difficulty urinating, dysuria and flank pain.  Musculoskeletal:  Positive for arthralgias, back pain and gait problem. Negative for joint swelling and myalgias.  Neurological:  Negative for dizziness, tremors, seizures, syncope, facial asymmetry, speech difficulty, weakness, light-headedness, numbness and headaches.  Psychiatric/Behavioral:  Positive for confusion. Negative for agitation and behavioral problems.    Immunization History  Administered Date(s) Administered   Fluad Quad(high Dose 65+) 04/05/2020   Influenza Split 03/08/2011, 03/05/2012   Influenza Whole 03/08/2007, 03/02/2009, 03/29/2010   Influenza, High Dose Seasonal PF 03/21/2013, 06/13/2017, 03/07/2019, 03/26/2020   Influenza,inj,Quad PF,6+ Mos 02/20/2014, 02/11/2015, 03/22/2018   Influenza-Unspecified 03/23/2016, 04/05/2020   Moderna SARS-COV2 Booster Vaccination 09/20/2020   Moderna Sars-Covid-2 Vaccination 06/10/2019, 07/09/2019    PFIZER(Purple Top)SARS-COV-2 Vaccination 04/05/2020   Pneumococcal Conjugate-13 07/30/2014   Pneumococcal Polysaccharide-23 02/03/2002, 12/16/2009   Td 02/03/2002   Tdap 12/11/2012   Pertinent  Health Maintenance Due  Topic Date Due   INFLUENZA VACCINE  12/27/2020   DEXA SCAN  Completed   Fall Risk 04/06/2020 05/19/2020 09/15/2020 12/29/2020 06/15/2021  Falls in the past year? 1 1 0 0 1  Was there an injury with Fall? 0 0 1 - 1  Was there an injury with Fall? - - - - -  Fall Risk Category Calculator 2 1 - - 3  Fall Risk Category Moderate Low - - High  Patient Fall Risk Level Moderate fall risk Moderate fall risk - Low fall risk High fall risk  Patient Fall Risk Level Patient fell while getting dressed in closet. - - - -  Patient at Risk for Falls Due to - - - - Impaired mobility  Fall risk Follow up - - - Falls evaluation completed Falls evaluation completed   Functional Status Survey:    Vitals:   07/01/21 1421  BP: (!) 179/89  Pulse: 84  Resp: 18  Temp: 97.7 F (36.5 C)  SpO2: 96%  Weight: 123 lb (55.8 kg)  Height: 5' (1.524 m)   Body mass index is 24.02 kg/m. Physical Exam Vitals and nursing note reviewed.  Constitutional:      General: She is not in acute distress.    Appearance: She is not diaphoretic.  HENT:     Head: Normocephalic and atraumatic.  Neck:     Vascular: No JVD.  Cardiovascular:     Rate and Rhythm: Normal rate and regular rhythm.     Heart sounds: No murmur heard. Pulmonary:     Effort: Pulmonary effort is normal. No respiratory distress.     Breath sounds: Normal breath sounds. No wheezing.  Musculoskeletal:        General: No swelling, tenderness, deformity or signs of injury.     Right lower leg: No edema.     Left lower leg: No edema.     Comments: Strength 4/5 to BUE 4/5 BLE. Neg SLR  Skin:    General:  Skin is warm and dry.  Neurological:     Mental Status: She is alert and oriented to person, place, and time.     Deep Tendon  Reflexes:     Reflex Scores:      Patellar reflexes are 2+ on the right side and 2+ on the left side.      Achilles reflexes are 2+ on the right side and 2+ on the left side.   Labs reviewed: Recent Labs    12/23/20 0000  NA 141  K 4.0  CL 102  CO2 24*  BUN 23*  CREATININE 0.6  CALCIUM 9.3   Recent Labs    12/23/20 0000  AST 22  ALT 13  ALKPHOS 73  ALBUMIN 4.4   Recent Labs    12/23/20 0000  WBC 3.8  HGB 11.1*  HCT 33*  PLT 152   Lab Results  Component Value Date   TSH 2.79 12/23/2020   No results found for: HGBA1C Lab Results  Component Value Date   CHOL 238 (A) 08/20/2018   HDL 67 08/20/2018   LDLCALC 154 08/20/2018   LDLDIRECT 118.7 07/20/2011   TRIG 85 08/20/2018   CHOLHDL 4 12/30/2013    Significant Diagnostic Results in last 30 days:  XR Knee 1-2 Views Left  Result Date: 06/20/2021 AP lateral radiographs left knee reviewed.  Severe end-stage tricompartmental arthritis is present with slight valgus alignment.  Changes are worse in the lateral compartment.  No acute fracture.   Assessment/Plan  Lumbar back pain No radicular pain or red flag signs.  Continue Tylenol Add ultram 25 q 6 prn pain Check thoracic, lumbar and sacral xray 2 view, may have compression fx causing pain F/U with ortho if not improving or worsening.   Osteoporosis T score -4.5 04/15/19, worse from 2018 -4.3 Starting Prolia in April, was on evenity   HTN BP staying in the 170-180 range Increase losartan to 75 mg qd BMP ordered in two months already  Family/ staff Communication:   Labs/tests ordered:   back xray Total time 93min:  time greater than 50% of total time spent doing pt counseling and coordination of care

## 2021-07-01 NOTE — Telephone Encounter (Signed)
VOB submitted for SynviscOne, left knee. BV pending. 

## 2021-07-04 ENCOUNTER — Encounter: Payer: Self-pay | Admitting: Adult Health

## 2021-07-04 MED ORDER — LOSARTAN POTASSIUM 50 MG PO TABS: 75.0000 mg | ORAL_TABLET | Freq: Every day | ORAL | 1 refills | Status: DC

## 2021-07-04 NOTE — Addendum Note (Signed)
Addended by: Barnie Mort on: 07/04/2021 09:23 AM   Modules accepted: Orders

## 2021-07-07 DIAGNOSIS — Z48817 Encounter for surgical aftercare following surgery on the skin and subcutaneous tissue: Secondary | ICD-10-CM | POA: Diagnosis not present

## 2021-07-08 ENCOUNTER — Telehealth: Payer: Self-pay

## 2021-07-08 NOTE — Telephone Encounter (Signed)
PA required for SYnviscOne, left knee. Faxed completed PA form to [facility] at [phone] PA Pending.

## 2021-07-11 ENCOUNTER — Telehealth: Payer: Self-pay

## 2021-07-11 NOTE — Telephone Encounter (Signed)
Tried calling patient to schedule for gel injection with Dr. Marlou Sa, but no answer and no VM setup to leave a message.  Approved for SynviscOne, left knee. [place] Patient will be responsible for 20% OOP. Co-pay of $25.00 Approval Dates 07/08/2021- 10/05/2021

## 2021-07-13 DIAGNOSIS — R296 Repeated falls: Secondary | ICD-10-CM | POA: Diagnosis not present

## 2021-07-13 DIAGNOSIS — R2681 Unsteadiness on feet: Secondary | ICD-10-CM | POA: Diagnosis not present

## 2021-07-13 DIAGNOSIS — M25562 Pain in left knee: Secondary | ICD-10-CM | POA: Diagnosis not present

## 2021-07-13 DIAGNOSIS — M1712 Unilateral primary osteoarthritis, left knee: Secondary | ICD-10-CM | POA: Diagnosis not present

## 2021-08-01 DIAGNOSIS — Z4801 Encounter for change or removal of surgical wound dressing: Secondary | ICD-10-CM | POA: Diagnosis not present

## 2021-08-01 DIAGNOSIS — Z48817 Encounter for surgical aftercare following surgery on the skin and subcutaneous tissue: Secondary | ICD-10-CM | POA: Diagnosis not present

## 2021-08-02 ENCOUNTER — Telehealth: Payer: Self-pay | Admitting: *Deleted

## 2021-08-02 DIAGNOSIS — R339 Retention of urine, unspecified: Secondary | ICD-10-CM | POA: Diagnosis not present

## 2021-08-02 NOTE — Telephone Encounter (Signed)
I reached out to patient regarding her earlier call about UTI Symptoms.  ?Patient stated that she called the office and was told that Dr. Lyndel Safe was full for tomorrow and to call the Nurse Nira Conn at [facility] to evaluate.  ?Patient stated that a Nurse there at [facility] had her urinate into a "clean" toilet and they looked at it and said it looked normal and there were no odor. Patient stated that they told her that her symptoms were Normal for her age.  ? ?I told patient to properly check for a UTI we would need for her to urinate into a cup and then send it off to have tested and then it would let us know what antibiotic to place her on if she had a UTI.  ? ?She stated that she did not want to go over no one's head and was fearful to schedule an appointment in office. Stated that she felt she was dropped between the cracks.  ? ?I assured her that she wasn't and told her that no one would be upset with her scheduling an appointment here at our office.  ? ?She agreed to scheduling and stated that she will have [facility] bring her 08/03/2021 at 2:15 to see Dinah.  ?

## 2021-08-02 NOTE — Telephone Encounter (Signed)
This should have been handled by AL nurse instead of her calling our office. ?I think Vanessa Mason sometimes get confused ?I have talked to the Supervisor and ordered UA and Culture. ?She does not need to come to our  Office. Can be seen by one of [place] in her room. ? ?

## 2021-08-03 ENCOUNTER — Ambulatory Visit: Payer: Medicare HMO | Admitting: Family

## 2021-08-03 NOTE — Telephone Encounter (Signed)
Spoke with patient and she stated that she will try to remember to handle her concerns through [place] nurse in the future. ? ?Patient states a urine sample was collected yesterday evening.  ?

## 2021-08-03 NOTE — Telephone Encounter (Signed)
Called patient and left a message informing her that her concern and future concerns should be addressed via the facility nursing staff ?

## 2021-08-08 DIAGNOSIS — H16143 Punctate keratitis, bilateral: Secondary | ICD-10-CM | POA: Diagnosis not present

## 2021-08-08 DIAGNOSIS — H35423 Microcystoid degeneration of retina, bilateral: Secondary | ICD-10-CM | POA: Diagnosis not present

## 2021-08-08 DIAGNOSIS — H353221 Exudative age-related macular degeneration, left eye, with active choroidal neovascularization: Secondary | ICD-10-CM | POA: Diagnosis not present

## 2021-08-08 DIAGNOSIS — H353112 Nonexudative age-related macular degeneration, right eye, intermediate dry stage: Secondary | ICD-10-CM | POA: Diagnosis not present

## 2021-08-08 DIAGNOSIS — H43813 Vitreous degeneration, bilateral: Secondary | ICD-10-CM | POA: Diagnosis not present

## 2021-08-19 ENCOUNTER — Other Ambulatory Visit: Payer: Self-pay

## 2021-08-19 ENCOUNTER — Ambulatory Visit: Payer: Medicare HMO

## 2021-08-19 ENCOUNTER — Ambulatory Visit: Payer: Medicare HMO | Admitting: Surgical

## 2021-08-19 DIAGNOSIS — M25552 Pain in left hip: Secondary | ICD-10-CM | POA: Diagnosis not present

## 2021-08-19 DIAGNOSIS — M1712 Unilateral primary osteoarthritis, left knee: Secondary | ICD-10-CM

## 2021-08-20 ENCOUNTER — Encounter: Payer: Self-pay | Admitting: Surgical

## 2021-08-20 MED ORDER — LIDOCAINE HCL 1 % IJ SOLN
5.0000 mL | INTRAMUSCULAR | Status: AC | PRN
  Administered 2021-08-19: 5 mL

## 2021-08-20 MED ORDER — METHYLPREDNISOLONE ACETATE 40 MG/ML IJ SUSP
40.0000 mg | INTRAMUSCULAR | Status: AC | PRN
  Administered 2021-08-19: 40 mg via INTRA_ARTICULAR

## 2021-08-20 MED ORDER — HYLAN G-F 20 48 MG/6ML IX SOSY
48.0000 mg | PREFILLED_SYRINGE | INTRA_ARTICULAR | Status: AC | PRN
  Administered 2021-08-19: 48 mg via INTRA_ARTICULAR

## 2021-08-20 MED ORDER — BUPIVACAINE HCL 0.25 % IJ SOLN
4.0000 mL | INTRAMUSCULAR | Status: AC | PRN
  Administered 2021-08-19: 4 mL via INTRA_ARTICULAR

## 2021-08-20 NOTE — Progress Notes (Signed)
? ?Office Visit Note ?  ?Patient: Vanessa Mason           ?Date of Birth: Apr 04, 1918           ?MRN: [id number] ?Visit Date: 08/19/2021 ?Requested by: Virgie Dad, MD ?[address] ?[place],  [place] [address] ?PCP: Virgie Dad, MD ? ?Subjective: ?Chief Complaint  ?Patient presents with  ? Left Knee - Pain  ? ? ?HPI: Vanessa Mason is a 88 y.o. female who presents to the office complaining of left knee pain and left hip pain.  Patient is here for scheduled left knee Synvisc 1 injection.  She was recently seen by Dr. Marlou Sa on 06/20/2021 and had left knee injection with cortisone.  She states that her left knee pain has returned and she is looking forward to trying this gel injection.  Her other complaint is lateral hip pain which she has dealt with in the past.  She had left trochanteric injection on 02/02/2021; she cannot recall if this helped her but she had no trochanteric tenderness on her last exam when she was seen by Dr. Marlou Sa in January.  No new injuries.Marland Kitchen   ?             ?ROS: All systems reviewed are negative as they relate to the chief complaint within the history of present illness.  Patient denies fevers or chills. ? ?Assessment & Plan: ?Visit Diagnoses:  ?1. Greater trochanteric pain syndrome of left lower extremity   ?2. Arthritis of left knee   ? ? ?Plan: Patient is a 88 year old female who returns to the office accompanied by her son, Vanessa Mason.  She has similar complaints last visit, complaining of primarily left knee pain and left hip pain.  She has trochanteric tenderness over the left hip and continued joint line tenderness over the medial and lateral joint lines of the left knee.  Discussed the options available to patient.  She would like to proceed with Synvisc 1 injection today.  Also discussed the options available for her left hip pain with Vanessa Mason and her son.  Main options would include trying another trochanteric injection to see if this helps versus trying physical  therapy exercises versus combination of both.  After lengthy discussion, patient and her son would like to try another trochanteric injection.  She cannot recall if the first 1 helped but she had no trochanteric tenderness on her last exam so seems like it did help for some time.  Recommended that patient or her son call the office 2 weeks out from the injection to report whether or not it had improvement in her symptoms.  She will also check with her assisted living facility as to whether they have in-house physical therapy; if they do, plan to fax prescription for physical therapy with recommendations.  If not, referral will be placed for physical therapy upstairs.  Today, under ultrasound guidance, a left trochanteric injection was delivered and patient tolerated procedure well.  Prior to the injection, patient did have moderate tenderness over the greater trochanter.  After administration of the final injection, patient had significant decrease in the tenderness over the trochanter.  Left knee Synvisc 1 injection was also administered today.  Follow-up with the office in 3 months, which will be the soonest time that we can repeat a cortisone injection in the left knee.  Patient and son agreed with plan. ? ?Follow-Up Instructions: No follow-ups on file.  ? ?Orders:  ?Orders Placed This Encounter  ?Procedures  ?  US Guided Needle Placement - No Linked Charges  ? ?No orders of the defined types were placed in this encounter. ? ? ? ? Procedures: ?Large Joint Inj: L greater trochanter on 08/19/2021 10:28 AM ?Indications: pain and diagnostic evaluation ?Details: 18 G 1.5 in needle, ultrasound-guided lateral approach ? ?Arthrogram: No ? ?Medications: 5 mL lidocaine 1 %; 4 mL bupivacaine 0.25 %; 40 mg methylPREDNISolone acetate 40 MG/ML ?Outcome: tolerated well, no immediate complications ?Procedure, treatment alternatives, risks and benefits explained, specific risks discussed. Consent was given by the patient.  Immediately prior to procedure a time out was called to verify the correct patient, procedure, equipment, support staff and site/side marked as required. Patient was prepped and draped in the usual sterile fashion.  ? ? ?Large Joint Inj: L knee on 08/19/2021 10:28 AM ?Indications: pain, joint swelling and diagnostic evaluation ?Details: 18 G 1.5 in needle, superolateral approach ? ?Arthrogram: No ? ?Medications: 5 mL lidocaine 1 %; 48 mg Hylan 48 MG/6ML ?Outcome: tolerated well, no immediate complications ?Procedure, treatment alternatives, risks and benefits explained, specific risks discussed. Consent was given by the patient. Immediately prior to procedure a time out was called to verify the correct patient, procedure, equipment, support staff and site/side marked as required. Patient was prepped and draped in the usual sterile fashion.  ? ? ? ? ?Clinical Data: ?No additional findings. ? ?Objective: ?Vital Signs: There were no vitals taken for this visit. ? ?Physical Exam:  ?Constitutional: Patient appears well-developed ?HEENT:  ?Head: Normocephalic ?Eyes:EOM are normal ?Neck: Normal range of motion ?Cardiovascular: Normal rate ?Pulmonary/chest: Effort normal ?Neurologic: Patient is alert ?Skin: Skin is warm ?Psychiatric: Patient has normal mood and affect ? ?Ortho Exam: Ortho exam demonstrates left knee with no effusion.  Tenderness over the medial lateral joint lines.  Active extension of the left knee is intact with patient able to perform straight leg raise.  No pain with hip range of motion.  She has tenderness over the greater trochanter of the left hip.  Ambulates with a walker fairly well with no leg buckling today.  No calf tenderness.  Negative Homans' sign. ? ?Specialty Comments:  ?No specialty comments available. ? ?Imaging: ?No results found. ? ? ?PMFS History: ?Patient Active Problem List  ? Diagnosis Date Noted  ? Major depressive disorder with single episode, in full remission ([place]) 07/28/2020  ?  Trochanteric bursitis of left hip 04/09/2019  ? Word finding difficulty 04/09/2019  ? Mild cognitive impairment with memory loss 04/09/2019  ? Senile osteoporosis 12/27/2018  ? Chronic constipation 12/11/2014  ? Depression 06/18/2014  ? Actinic keratosis 02/10/2014  ? History of partial thyroidectomy 12/30/2013  ? Rash and nonspecific skin eruption 12/30/2013  ? DNR (do not resuscitate) 12/16/2013  ? LBBB (left bundle branch block) 07/30/2013  ? Osteoarthritis of left knee 06/17/2013  ? Hyperlipemia 09/12/2010  ? BACK PAIN 03/08/2007  ? HIATAL HERNIA 12/17/2006  ? Essential hypertension 03/09/2006  ? ?Past Medical History:  ?Diagnosis Date  ? Arthritis   ? "all over"  ? BACK PAIN 03/08/2007  ? Cancer [facility])   ? squamous cell on R leg & face- ?basal cell  ? DEPRESSIVE DISORDER 12/01/2008  ? Duodenal ulcer   ? GI bleed 09/12/2010  ? Naproxen induced Endoscopy with dr Harlene Ramus   ? HIATAL HERNIA 12/17/2006  ? History of hiatal hernia   ? HYPERLIPIDEMIA 03/09/2006  ? HYPERTENSION 03/09/2006  ? KNEE PAIN 05/06/2009  ? Macular degeneration   ? Pneumonia   ? 1yr. ago-  hosp. pneumonia  ? Urinary urgency   ?  ?Family History  ?Problem Relation Age of Onset  ? Cancer Mother 54  ? Cancer Brother   ?  ?Past Surgical History:  ?Procedure Laterality Date  ? ABDOMINAL HYSTERECTOMY  1975  ? APPLICATION OF A-CELL OF EXTREMITY Right 12/10/2014  ? Procedure: APPLICATION OF A-CELL OF EXTREMITY;  Surgeon: Theodoro Kos, DO;  Location: [facility];  Service: Plastics;  Laterality: Right;  ? arthroscopic knee Right   ? x 2  ? BACK SURGERY  2000  ? spinal fusion  ? BILATERAL SALPINGECTOMY Bilateral 1989  ? Dr. Marianna Fuss  ? CARDIAC CATHETERIZATION    ? EYE SURGERY    ? cataracts bilateral - removed  ? HERNIA REPAIR Bilateral   ? inguinal   ? I & D EXTREMITY Right 12/10/2014  ? Procedure: IRRIGATION AND DEBRIDEMENT ON RIGHT LEG, ;  Surgeon: Theodoro Kos, DO;  Location: [facility];  Service: Plastics;  Laterality: Right;  ? I & D EXTREMITY Right 01/20/2015  ?  Procedure: IRRIGATION AND DEBRIDEMENT RIGHT LOWER LEG WOUND, with ACELL to Donor Site, SKIN GRAFT AND VAC Placement;  Surgeon: Theodoro Kos, DO;  Location: [facility];  Service: Plastics;  Laterality: Right;  ? LUMBAR LAMINE

## 2021-08-24 ENCOUNTER — Telehealth: Payer: Self-pay | Admitting: *Deleted

## 2021-08-24 NOTE — Telephone Encounter (Signed)
Patient caregiver called and stated that patient saw the Audiologist and recommended patient to get Debrox for earwax. Stated that the Right ear is worse than the Left.  ? ?Requesting an order to be given to the [facility] Nurse for Regular Debroxing.  ? ?Please Advise.  ?

## 2021-08-25 ENCOUNTER — Telehealth: Payer: Self-pay | Admitting: Surgical

## 2021-08-25 NOTE — Telephone Encounter (Signed)
Received from from [facility] via fax. Dr Marlou Sa signed off on it and will be sent back today along with last OV note ?

## 2021-08-25 NOTE — Telephone Encounter (Signed)
Received call from [facility] with [facility] needing order for (PT) faxed over to her. The fax number is [phone]   The phone number to contact Cala Bradford is [phone] ?

## 2021-08-25 NOTE — Telephone Encounter (Signed)
Spoke with nurse Chantea. [facility] has a protocol already in place for cerumen impaction. I recommended that they follow the current protocol. ?

## 2021-08-30 ENCOUNTER — Ambulatory Visit (INDEPENDENT_AMBULATORY_CARE_PROVIDER_SITE_OTHER): Payer: Medicare HMO

## 2021-08-30 DIAGNOSIS — M810 Age-related osteoporosis without current pathological fracture: Secondary | ICD-10-CM

## 2021-08-30 MED ORDER — DENOSUMAB 60 MG/ML ~~LOC~~ SOSY
60.0000 mg | PREFILLED_SYRINGE | Freq: Once | SUBCUTANEOUS | Status: AC
  Administered 2021-08-30: 60 mg via SUBCUTANEOUS

## 2021-09-09 DIAGNOSIS — M62561 Muscle wasting and atrophy, not elsewhere classified, right lower leg: Secondary | ICD-10-CM | POA: Diagnosis not present

## 2021-09-09 DIAGNOSIS — R54 Age-related physical debility: Secondary | ICD-10-CM | POA: Diagnosis not present

## 2021-09-09 DIAGNOSIS — R2681 Unsteadiness on feet: Secondary | ICD-10-CM | POA: Diagnosis not present

## 2021-09-09 DIAGNOSIS — R2689 Other abnormalities of gait and mobility: Secondary | ICD-10-CM | POA: Diagnosis not present

## 2021-09-09 DIAGNOSIS — M7989 Other specified soft tissue disorders: Secondary | ICD-10-CM | POA: Diagnosis not present

## 2021-09-09 DIAGNOSIS — M25552 Pain in left hip: Secondary | ICD-10-CM | POA: Diagnosis not present

## 2021-09-09 DIAGNOSIS — M25562 Pain in left knee: Secondary | ICD-10-CM | POA: Diagnosis not present

## 2021-09-09 DIAGNOSIS — M62562 Muscle wasting and atrophy, not elsewhere classified, left lower leg: Secondary | ICD-10-CM | POA: Diagnosis not present

## 2021-09-12 ENCOUNTER — Encounter: Payer: Medicare HMO | Admitting: Adult Health

## 2021-09-12 DIAGNOSIS — M25562 Pain in left knee: Secondary | ICD-10-CM | POA: Diagnosis not present

## 2021-09-12 DIAGNOSIS — R54 Age-related physical debility: Secondary | ICD-10-CM | POA: Diagnosis not present

## 2021-09-12 DIAGNOSIS — R2689 Other abnormalities of gait and mobility: Secondary | ICD-10-CM | POA: Diagnosis not present

## 2021-09-12 DIAGNOSIS — M62562 Muscle wasting and atrophy, not elsewhere classified, left lower leg: Secondary | ICD-10-CM | POA: Diagnosis not present

## 2021-09-12 DIAGNOSIS — M25552 Pain in left hip: Secondary | ICD-10-CM | POA: Diagnosis not present

## 2021-09-12 DIAGNOSIS — M7989 Other specified soft tissue disorders: Secondary | ICD-10-CM | POA: Diagnosis not present

## 2021-09-12 DIAGNOSIS — M62561 Muscle wasting and atrophy, not elsewhere classified, right lower leg: Secondary | ICD-10-CM | POA: Diagnosis not present

## 2021-09-12 DIAGNOSIS — R2681 Unsteadiness on feet: Secondary | ICD-10-CM | POA: Diagnosis not present

## 2021-09-13 ENCOUNTER — Encounter: Payer: Self-pay | Admitting: Orthopedic Surgery

## 2021-09-13 ENCOUNTER — Non-Acute Institutional Stay: Payer: Medicare HMO | Admitting: Orthopedic Surgery

## 2021-09-13 DIAGNOSIS — M545 Low back pain, unspecified: Secondary | ICD-10-CM

## 2021-09-13 DIAGNOSIS — G3184 Mild cognitive impairment, so stated: Secondary | ICD-10-CM | POA: Diagnosis not present

## 2021-09-13 DIAGNOSIS — M7062 Trochanteric bursitis, left hip: Secondary | ICD-10-CM

## 2021-09-13 DIAGNOSIS — I10 Essential (primary) hypertension: Secondary | ICD-10-CM

## 2021-09-13 DIAGNOSIS — R69 Illness, unspecified: Secondary | ICD-10-CM | POA: Diagnosis not present

## 2021-09-13 DIAGNOSIS — F325 Major depressive disorder, single episode, in full remission: Secondary | ICD-10-CM

## 2021-09-13 DIAGNOSIS — M810 Age-related osteoporosis without current pathological fracture: Secondary | ICD-10-CM | POA: Diagnosis not present

## 2021-09-13 DIAGNOSIS — K5901 Slow transit constipation: Secondary | ICD-10-CM | POA: Diagnosis not present

## 2021-09-13 DIAGNOSIS — R296 Repeated falls: Secondary | ICD-10-CM | POA: Diagnosis not present

## 2021-09-13 NOTE — Progress Notes (Addendum)
?Location:  [place] ?Nursing Home Room Number: 469/G ?Place of Service:  ALF (13) ?Provider: Yvonna Alanis, NP ? ?Patient Care Team: ?Virgie Dad, MD as PCP - General (Internal Medicine) ?Ronald Lobo, MD as Consulting Physician (Gastroenterology) ?Vickey Huger, MD as Consulting Physician (Orthopedic Surgery) ?Izora Ribas as Consulting Physician (Dermatology) ?Darlin Coco, MD as Consulting Physician (Cardiology) ?Luberta Mutter, MD as Consulting Physician (Ophthalmology) ?Karin Golden, MD as Referring Physician (Dermatology) ? ?Extended Emergency Contact Information ?Primary Emergency Contact: Wright,Janet ? [place] ?Home Phone: [phone] ?Relation: Daughter ?Secondary Emergency Contact: Hargrove,Fred ?Address: [place] [address] [place] ?Home Phone: [phone] ?Mobile Phone: [phone] ?Relation: Son ? ?Code Status:  DNR ?Goals of care: Advanced Directive information ? ?  09/13/2021  ?  8:35 AM  ?Advanced Directives  ?Does Patient Have a Medical Advance Directive? Yes  ?Type of Paramedic of [place];Living will;Out of facility DNR (pink MOST or yellow form)  ?Does patient want to make changes to medical advance directive? No - Patient declined  ?Copy of [place] in Chart? Yes - validated most recent copy scanned in chart (See row information)  ?Pre-existing out of facility DNR order (yellow form or pink MOST form) Yellow form placed in chart (order not valid for inpatient use)  ? ? ? ?Chief Complaint  ?Patient presents with  ? Medical Management of Chronic Issues  ?  Routine visit.  ? Quality Metric Gaps  ?  Discuss the need for Shingrix vaccine and additional Covid booster, or post pone if patient refuses.   ? ? ?HPI:  ?Pt is a 88 y.o. female seen today for medical management of chronic diseases.   ? ?Cognitive impairment- doing well in AL, no behavioral  outbursts ?Recurrent falls- no recent falls, h/o rib fractures to left 6/7/8 03/2021, ambulates without device ?Left hip bursitis- followed by Dr. Marlou Sa, cortisone injection and PT ordered 03/24, she denies pain today, ambulates with rolator, able to demonstrate exercises correctly, no recent falls, remains on tylenol prn for pain ?Back pain- denies pain today, remains on scheduled tylenol and Cymbalta ?HTN- BUN/creat 23/0.6 12/23/2021, see pressures below, remains on losartan ?Depression- no mood changes, remains on Cymbalta ?Osteoporosis- DEXA 2020, t score -4.5, evenity since 10/2019, start Prolia 08/2021, remains on vitamin D ?Constipation- LBM- cannot recall, remains on senna and miralax prn ? ?Recent blood pressure: ? 04/16- 115/68 ? 04/09- 129/75 ? 04/02- 119/78 ? ?Recent weights: ? 03/01- 124.2 lbs ? 02/01- 123.2 lbs ? 01/07- 122.2 lbs ? ? ? ?Past Medical History:  ?Diagnosis Date  ? Arthritis   ? "all over"  ? BACK PAIN 03/08/2007  ? Cancer [facility])   ? squamous cell on R leg & face- ?basal cell  ? DEPRESSIVE DISORDER 12/01/2008  ? Duodenal ulcer   ? GI bleed 09/12/2010  ? Naproxen induced Endoscopy with dr Harlene Ramus   ? HIATAL HERNIA 12/17/2006  ? History of hiatal hernia   ? HYPERLIPIDEMIA 03/09/2006  ? HYPERTENSION 03/09/2006  ? KNEE PAIN 05/06/2009  ? Macular degeneration   ? Pneumonia   ? 93yr. ago- hosp. pneumonia  ? Urinary urgency   ? ?Past Surgical History:  ?Procedure Laterality Date  ? ABDOMINAL HYSTERECTOMY  1975  ? APPLICATION OF A-CELL OF EXTREMITY Right 12/10/2014  ? Procedure: APPLICATION OF A-CELL OF EXTREMITY;  Surgeon: CTheodoro Kos DO;  Location: [place]  Service: Plastics;  Laterality: Right;  ? arthroscopic knee Right   ? x 2  ? BACK SURGERY  2000  ? spinal fusion  ? BILATERAL SALPINGECTOMY Bilateral 1989  ? Dr. Marianna Fuss  ? CARDIAC CATHETERIZATION    ? EYE SURGERY    ? cataracts bilateral - removed  ? HERNIA REPAIR Bilateral   ? inguinal   ? I & D EXTREMITY Right 12/10/2014  ? Procedure: IRRIGATION  AND DEBRIDEMENT ON RIGHT LEG, ;  Surgeon: Theodoro Kos, DO;  Location: [facility];  Service: Plastics;  Laterality: Right;  ? I & D EXTREMITY Right 01/20/2015  ? Procedure: IRRIGATION AND DEBRIDEMENT RIGHT LOWER LEG WOUND, with ACELL to Donor Site, SKIN GRAFT AND VAC Placement;  Surgeon: Theodoro Kos, DO;  Location: [facility];  Service: Plastics;  Laterality: Right;  ? LUMBAR LAMINECTOMY  02-17-1999  ? with sinal fusion L3,4,5,&S1  ? moes    ? moses surgery on R lle  ? OOPHORECTOMY  1989  ? RESECTION DISTAL CLAVICAL    ? ROTATOR CUFF REPAIR Right 2001  ? x2  ? SKIN GRAFT Right 05/29/2018  ? THYROIDECTOMY    ? TONSILLECTOMY  1928  ? TUBAL LIGATION    ? ? ?Allergies  ?Allergen Reactions  ? Naproxen   ?  Gi bleed  ? Nsaids   ?  GI BLEED  ? Aspirin   ?  GI bleed  ? Ace Inhibitors   ?  Cough ?  ? Caffeine Other (See Comments)  ?  Causes joints to be painful  ? ? ?Outpatient Encounter Medications as of 09/13/2021  ?Medication Sig  ? acetaminophen (TYLENOL) 500 MG tablet Take 1,000 mg by mouth in the morning and at bedtime.  ? Ascorbic Acid (VITAMIN C) 1000 MG tablet Take 1,000 mg by mouth daily.  ? Carboxymethylcellulose Sodium (THERATEARS) 0.25 % SOLN Apply 2-3 drops to eye in the morning, at noon, in the evening, and at bedtime.  ? Cholecalciferol (VITAMIN D3) 50 MCG (2000 UT) TABS Take 1 tablet by mouth daily.  ? DULoxetine (CYMBALTA) 20 MG capsule Take 1 capsule (20 mg total) by mouth daily.  ? losartan (COZAAR) 50 MG tablet Take 1.5 tablets (75 mg total) by mouth daily.  ? Multiple Vitamins-Minerals (PRESERVISION AREDS 2+MULTI VIT PO) Take by mouth 2 (two) times daily.  ? Omega-3 1000 MG CAPS Take one capsule by mouth three times daily.  ? polyethylene glycol powder (GLYCOLAX/MIRALAX) 17 GM/SCOOP powder Take 17 g by mouth daily as needed.  ? Pregnenolone 50 MG TABS Take 1 tablet by mouth every morning.  ? sennosides-docusate sodium (SENOKOT-S) 8.6-50 MG tablet Take 2 tablets by mouth daily.  ? [DISCONTINUED] acetaminophen  (TYLENOL) 500 MG tablet Take 2 tablets (1,000 mg total) by mouth 3 (three) times daily. (Patient taking differently: Take 1,000 mg by mouth every 8 (eight) hours as needed.)  ? [DISCONTINUED] COVID-19 mRNA vaccine, Moderna, 100 MCG/0.5ML injection Inject into the muscle.  ? [DISCONTINUED] UNABLE TO FIND Life extension multi vitamin 2 daily  ? ?No facility-administered encounter medications on file as of 09/13/2021.  ? ? ?Review of Systems  ?Constitutional:  Negative for activity change, appetite change, chills, fatigue and fever.  ?HENT:  Negative for congestion and trouble swallowing.   ?Eyes:  Negative for visual disturbance.  ?Respiratory:  Negative for cough, shortness of breath and wheezing.   ?Cardiovascular:  Negative for chest pain and leg swelling.  ?Gastrointestinal:  Positive for constipation. Negative for abdominal distention, abdominal pain, diarrhea, nausea and vomiting.  ?  Genitourinary:  Positive for frequency. Negative for dysuria and hematuria.  ?     Incontinence  ?Musculoskeletal:  Positive for arthralgias, back pain and gait problem.  ?Skin:  Negative for rash and wound.  ?Neurological:  Positive for weakness. Negative for dizziness and headaches.  ?Psychiatric/Behavioral:  Positive for confusion and dysphoric mood. Negative for sleep disturbance. The patient is not nervous/anxious.   ? ?Immunization History  ?Administered Date(s) Administered  ? Fluad Quad(high Dose 65+) 04/05/2020  ? Influenza Split 03/08/2011, 03/05/2012  ? Influenza Whole 03/08/2007, 03/02/2009, 03/29/2010  ? Influenza, High Dose Seasonal PF 03/21/2013, 06/13/2017, 03/07/2019, 03/26/2020  ? Influenza,inj,Quad PF,6+ Mos 02/20/2014, 02/11/2015, 03/22/2018  ? Influenza-Unspecified 03/23/2016, 04/05/2020  ? Moderna SARS-COV2 Booster Vaccination 09/20/2020  ? Moderna Sars-Covid-2 Vaccination 06/10/2019, 07/09/2019  ? PFIZER(Purple Top)SARS-COV-2 Vaccination 04/05/2020  ? Pneumococcal Conjugate-13 07/30/2014  ? Pneumococcal  Polysaccharide-23 02/03/2002, 12/16/2009  ? Td 02/03/2002  ? Tdap 12/11/2012  ? ?Pertinent  Health Maintenance Due  ?Topic Date Due  ? INFLUENZA VACCINE  12/27/2021  ? DEXA SCAN  Completed  ? ? ?  04/06/2020  ?  9:35 AM 12

## 2021-09-14 DIAGNOSIS — M25562 Pain in left knee: Secondary | ICD-10-CM | POA: Diagnosis not present

## 2021-09-14 DIAGNOSIS — R2689 Other abnormalities of gait and mobility: Secondary | ICD-10-CM | POA: Diagnosis not present

## 2021-09-14 DIAGNOSIS — M62562 Muscle wasting and atrophy, not elsewhere classified, left lower leg: Secondary | ICD-10-CM | POA: Diagnosis not present

## 2021-09-14 DIAGNOSIS — M25552 Pain in left hip: Secondary | ICD-10-CM | POA: Diagnosis not present

## 2021-09-14 DIAGNOSIS — M7989 Other specified soft tissue disorders: Secondary | ICD-10-CM | POA: Diagnosis not present

## 2021-09-14 DIAGNOSIS — R54 Age-related physical debility: Secondary | ICD-10-CM | POA: Diagnosis not present

## 2021-09-14 DIAGNOSIS — M62561 Muscle wasting and atrophy, not elsewhere classified, right lower leg: Secondary | ICD-10-CM | POA: Diagnosis not present

## 2021-09-14 DIAGNOSIS — R2681 Unsteadiness on feet: Secondary | ICD-10-CM | POA: Diagnosis not present

## 2021-09-15 DIAGNOSIS — E890 Postprocedural hypothyroidism: Secondary | ICD-10-CM | POA: Diagnosis not present

## 2021-09-15 LAB — CBC AND DIFFERENTIAL
HCT: 37 (ref 36–46)
Hemoglobin: 12.2 (ref 12.0–16.0)
Platelets: 152 10*3/uL (ref 150–400)
WBC: 4.9

## 2021-09-15 LAB — COMPREHENSIVE METABOLIC PANEL
Albumin: 4.5 (ref 3.5–5.0)
Calcium: 9.6 (ref 8.7–10.7)
Globulin: 2.3

## 2021-09-15 LAB — BASIC METABOLIC PANEL
BUN: 21 (ref 4–21)
CO2: 25 — AB (ref 13–22)
Chloride: 103 (ref 99–108)
Creatinine: 0.6 (ref 0.5–1.1)
Glucose: 92
Potassium: 4.1 mEq/L (ref 3.5–5.1)
Sodium: 140 (ref 137–147)

## 2021-09-15 LAB — HEPATIC FUNCTION PANEL
ALT: 14 U/L (ref 7–35)
AST: 21 (ref 13–35)
Alkaline Phosphatase: 57 (ref 25–125)
Bilirubin, Total: 0.7

## 2021-09-15 LAB — CBC: RBC: 4.07 (ref 3.87–5.11)

## 2021-09-16 NOTE — Progress Notes (Signed)
This encounter was created in error - please disregard.

## 2021-09-19 ENCOUNTER — Encounter: Payer: Medicare HMO | Admitting: Adult Health

## 2021-09-19 ENCOUNTER — Ambulatory Visit: Payer: Medicare HMO | Admitting: Orthopedic Surgery

## 2021-09-19 DIAGNOSIS — R54 Age-related physical debility: Secondary | ICD-10-CM | POA: Diagnosis not present

## 2021-09-19 DIAGNOSIS — M62562 Muscle wasting and atrophy, not elsewhere classified, left lower leg: Secondary | ICD-10-CM | POA: Diagnosis not present

## 2021-09-19 DIAGNOSIS — R2681 Unsteadiness on feet: Secondary | ICD-10-CM | POA: Diagnosis not present

## 2021-09-19 DIAGNOSIS — M25552 Pain in left hip: Secondary | ICD-10-CM | POA: Diagnosis not present

## 2021-09-19 DIAGNOSIS — M62561 Muscle wasting and atrophy, not elsewhere classified, right lower leg: Secondary | ICD-10-CM | POA: Diagnosis not present

## 2021-09-19 DIAGNOSIS — M7989 Other specified soft tissue disorders: Secondary | ICD-10-CM | POA: Diagnosis not present

## 2021-09-19 DIAGNOSIS — R2689 Other abnormalities of gait and mobility: Secondary | ICD-10-CM | POA: Diagnosis not present

## 2021-09-19 DIAGNOSIS — M25562 Pain in left knee: Secondary | ICD-10-CM | POA: Diagnosis not present

## 2021-09-21 DIAGNOSIS — M62561 Muscle wasting and atrophy, not elsewhere classified, right lower leg: Secondary | ICD-10-CM | POA: Diagnosis not present

## 2021-09-21 DIAGNOSIS — M7989 Other specified soft tissue disorders: Secondary | ICD-10-CM | POA: Diagnosis not present

## 2021-09-21 DIAGNOSIS — R2689 Other abnormalities of gait and mobility: Secondary | ICD-10-CM | POA: Diagnosis not present

## 2021-09-21 DIAGNOSIS — M25562 Pain in left knee: Secondary | ICD-10-CM | POA: Diagnosis not present

## 2021-09-21 DIAGNOSIS — R2681 Unsteadiness on feet: Secondary | ICD-10-CM | POA: Diagnosis not present

## 2021-09-21 DIAGNOSIS — M62562 Muscle wasting and atrophy, not elsewhere classified, left lower leg: Secondary | ICD-10-CM | POA: Diagnosis not present

## 2021-09-21 DIAGNOSIS — M25552 Pain in left hip: Secondary | ICD-10-CM | POA: Diagnosis not present

## 2021-09-21 DIAGNOSIS — R54 Age-related physical debility: Secondary | ICD-10-CM | POA: Diagnosis not present

## 2021-09-22 DIAGNOSIS — M62562 Muscle wasting and atrophy, not elsewhere classified, left lower leg: Secondary | ICD-10-CM | POA: Diagnosis not present

## 2021-09-22 DIAGNOSIS — M25552 Pain in left hip: Secondary | ICD-10-CM | POA: Diagnosis not present

## 2021-09-22 DIAGNOSIS — M7989 Other specified soft tissue disorders: Secondary | ICD-10-CM | POA: Diagnosis not present

## 2021-09-22 DIAGNOSIS — R2689 Other abnormalities of gait and mobility: Secondary | ICD-10-CM | POA: Diagnosis not present

## 2021-09-22 DIAGNOSIS — M62561 Muscle wasting and atrophy, not elsewhere classified, right lower leg: Secondary | ICD-10-CM | POA: Diagnosis not present

## 2021-09-22 DIAGNOSIS — R2681 Unsteadiness on feet: Secondary | ICD-10-CM | POA: Diagnosis not present

## 2021-09-22 DIAGNOSIS — M25562 Pain in left knee: Secondary | ICD-10-CM | POA: Diagnosis not present

## 2021-09-22 DIAGNOSIS — R54 Age-related physical debility: Secondary | ICD-10-CM | POA: Diagnosis not present

## 2021-09-29 DIAGNOSIS — R2681 Unsteadiness on feet: Secondary | ICD-10-CM | POA: Diagnosis not present

## 2021-09-29 DIAGNOSIS — M62562 Muscle wasting and atrophy, not elsewhere classified, left lower leg: Secondary | ICD-10-CM | POA: Diagnosis not present

## 2021-09-29 DIAGNOSIS — M25552 Pain in left hip: Secondary | ICD-10-CM | POA: Diagnosis not present

## 2021-09-29 DIAGNOSIS — R54 Age-related physical debility: Secondary | ICD-10-CM | POA: Diagnosis not present

## 2021-09-29 DIAGNOSIS — M62561 Muscle wasting and atrophy, not elsewhere classified, right lower leg: Secondary | ICD-10-CM | POA: Diagnosis not present

## 2021-09-29 DIAGNOSIS — M7989 Other specified soft tissue disorders: Secondary | ICD-10-CM | POA: Diagnosis not present

## 2021-09-29 DIAGNOSIS — R2689 Other abnormalities of gait and mobility: Secondary | ICD-10-CM | POA: Diagnosis not present

## 2021-09-29 DIAGNOSIS — M25562 Pain in left knee: Secondary | ICD-10-CM | POA: Diagnosis not present

## 2021-10-03 ENCOUNTER — Encounter: Payer: Self-pay | Admitting: Adult Health

## 2021-10-03 ENCOUNTER — Non-Acute Institutional Stay: Payer: Medicare HMO | Admitting: Adult Health

## 2021-10-03 DIAGNOSIS — M810 Age-related osteoporosis without current pathological fracture: Secondary | ICD-10-CM

## 2021-10-03 DIAGNOSIS — K5909 Other constipation: Secondary | ICD-10-CM | POA: Diagnosis not present

## 2021-10-03 DIAGNOSIS — E890 Postprocedural hypothyroidism: Secondary | ICD-10-CM | POA: Diagnosis not present

## 2021-10-03 DIAGNOSIS — M7062 Trochanteric bursitis, left hip: Secondary | ICD-10-CM | POA: Diagnosis not present

## 2021-10-03 DIAGNOSIS — M1712 Unilateral primary osteoarthritis, left knee: Secondary | ICD-10-CM

## 2021-10-03 DIAGNOSIS — G3184 Mild cognitive impairment, so stated: Secondary | ICD-10-CM

## 2021-10-03 DIAGNOSIS — I10 Essential (primary) hypertension: Secondary | ICD-10-CM

## 2021-10-03 NOTE — Patient Instructions (Signed)
Goal BP is <150/90 due to your age of 48.   ?

## 2021-10-03 NOTE — Assessment & Plan Note (Signed)
Continue Prolia ?Would not likely benefit from additional dexa scans.  ?Weight bearing exercise as tolerated.  ? ?

## 2021-10-03 NOTE — Assessment & Plan Note (Signed)
Followed by ortho ?Continue bid tylenol. Declined to increase to tid ?

## 2021-10-03 NOTE — Assessment & Plan Note (Signed)
Followed by ortho ?As above ?

## 2021-10-03 NOTE — Assessment & Plan Note (Signed)
Continues to do well in AL ?

## 2021-10-03 NOTE — Progress Notes (Signed)
? ?Location:  [facility] ? ?POS: Clinic ? ?Provider: Royal Hawthorn, ANP ? ?Code Status: DNR ?Goals of Care:  ? ?  10/03/2021  ?  1:06 PM  ?Advanced Directives  ?Does Patient Have a Medical Advance Directive? Yes  ?Type of Paramedic of [place];Living will;Out of facility DNR (pink MOST or yellow form)  ?Does patient want to make changes to medical advance directive? No - Patient declined  ?Copy of [place] in Chart? Yes - validated most recent copy scanned in chart (See row information)  ?Pre-existing out of facility DNR order (yellow form or pink MOST form) Yellow form placed in chart (order not valid for inpatient use)  ? ? ? ?Chief Complaint  ?Patient presents with  ? Medical Management of Chronic Issues  ?  Patient returns to the 3 month follow up.   ? Quality Metric Gaps  ?  Verified [facility] and [facility] patient is due for shingrix and #5 covid-19 vaccine.   ? ? ?HPI: Patient is a 88 y.o. female seen today for medical management of chronic diseases.   ?She turned 89 March of this year. Thinks her vision is worsening. Sees ophthalmology due to macular degeneration.  ? ?Osteoporosis ?T score -4.5 04/15/19, worse from 2018 -4 ?June of 2021 was on evenity. Started Prolia 4/23 ? ?HTN: SBP 150-170 recorded in AL, outlier of 973 systolic ? ?Left hip bursitis followed by Dr. Marlou Sa. Has received cortisone inj and PT. Ambulatory with a walker ?Feels her pain is still present, not much improvement. Taking tylenol bid.  ? ?Hx of falls with rib fx.  ? ?MCI: MMSE 26/30 01/25/21 ? ?Bowels are moving ?Sleeping well ?Denies any feelings of anxiety and depression ?Has gained 4 lbs in the three months.  ?Wt Readings from Last 3 Encounters:  ?10/03/21 127 lb 6.4 oz (57.8 kg)  ?09/13/21 125 lb 3.2 oz (56.8 kg)  ?07/01/21 123 lb (55.8 kg)  ? ? ?Past Medical History:  ?Diagnosis Date  ? Arthritis   ? "all over"  ? BACK PAIN 03/08/2007  ? Cancer [facility])   ? squamous cell on R leg & face- ?basal  cell  ? DEPRESSIVE DISORDER 12/01/2008  ? Duodenal ulcer   ? GI bleed 09/12/2010  ? Naproxen induced Endoscopy with dr Harlene Ramus   ? HIATAL HERNIA 12/17/2006  ? History of hiatal hernia   ? HYPERLIPIDEMIA 03/09/2006  ? HYPERTENSION 03/09/2006  ? KNEE PAIN 05/06/2009  ? Macular degeneration   ? Pneumonia   ? 80yr. ago- hosp. pneumonia  ? Urinary urgency   ? ? ?Past Surgical History:  ?Procedure Laterality Date  ? ABDOMINAL HYSTERECTOMY  1975  ? APPLICATION OF A-CELL OF EXTREMITY Right 12/10/2014  ? Procedure: APPLICATION OF A-CELL OF EXTREMITY;  Surgeon: CTheodoro Kos DO;  Location[facility]  Service: Plastics;  Laterality: Right;  ? arthroscopic knee Right   ? x 2  ? BACK SURGERY  2000  ? spinal fusion  ? BILATERAL SALPINGECTOMY Bilateral 1989  ? Dr. LMarianna Fuss ? CARDIAC CATHETERIZATION    ? EYE SURGERY    ? cataracts bilateral - removed  ? HERNIA REPAIR Bilateral   ? inguinal   ? I & D EXTREMITY Right 12/10/2014  ? Procedure: IRRIGATION AND DEBRIDEMENT ON RIGHT LEG, ;  Surgeon: CTheodoro Kos DO;  [facility]  Service: Plastics;  Laterality: Right;  ? I & D EXTREMITY Right 01/20/2015  ? Procedure: IRRIGATION AND DEBRIDEMENT RIGHT LOWER LEG WOUND, with ACELL  to Donor Site, SKIN GRAFT AND VAC Placement;  Surgeon: Theodoro Kos, DO;  Location: [facility];  Service: Plastics;  Laterality: Right;  ? LUMBAR LAMINECTOMY  02-17-1999  ? with sinal fusion L3,4,5,&S1  ? moes    ? moses surgery on R lle  ? OOPHORECTOMY  1989  ? RESECTION DISTAL CLAVICAL    ? ROTATOR CUFF REPAIR Right 2001  ? x2  ? SKIN GRAFT Right 05/29/2018  ? THYROIDECTOMY    ? TONSILLECTOMY  1928  ? TUBAL LIGATION    ? ? ?Allergies  ?Allergen Reactions  ? Naproxen   ?  Gi bleed  ? Nsaids   ?  GI BLEED  ? Aspirin   ?  GI bleed  ? Ace Inhibitors   ?  Cough ?  ? Caffeine Other (See Comments)  ?  Causes joints to be painful  ? ? ?Outpatient Encounter Medications as of 10/03/2021  ?Medication Sig  ? acetaminophen (TYLENOL) 500 MG tablet Take 1,000 mg by mouth in the morning and  at bedtime.  ? Ascorbic Acid (VITAMIN C) 1000 MG tablet Take 1,000 mg by mouth daily.  ? Carboxymethylcellulose Sodium (THERATEARS) 0.25 % SOLN Apply 2-3 drops to eye in the morning, at noon, in the evening, and at bedtime.  ? Cholecalciferol (VITAMIN D3) 50 MCG (2000 UT) TABS Take 1 tablet by mouth daily.  ? DULoxetine (CYMBALTA) 20 MG capsule Take 1 capsule (20 mg total) by mouth daily.  ? losartan (COZAAR) 50 MG tablet Take 1.5 tablets (75 mg total) by mouth daily.  ? Multiple Vitamins-Minerals (PRESERVISION AREDS 2+MULTI VIT PO) Take by mouth 2 (two) times daily.  ? Omega-3 1000 MG CAPS Take one capsule by mouth three times daily.  ? polyethylene glycol powder (GLYCOLAX/MIRALAX) 17 GM/SCOOP powder Take 17 g by mouth daily as needed.  ? Pregnenolone 50 MG TABS Take 1 tablet by mouth every morning.  ? sennosides-docusate sodium (SENOKOT-S) 8.6-50 MG tablet Take 2 tablets by mouth daily.  ? ?No facility-administered encounter medications on file as of 10/03/2021.  ? ? ?Review of Systems:  ?Review of Systems  ?Constitutional:  Negative for activity change, appetite change, chills, diaphoresis, fatigue, fever and unexpected weight change.  ?HENT:  Positive for hearing loss. Negative for congestion, rhinorrhea and trouble swallowing.   ?Eyes:  Negative for pain, discharge, redness and itching.  ?     Vision worsening over time.   ?Respiratory:  Negative for cough, shortness of breath and wheezing.   ?Cardiovascular:  Negative for chest pain, palpitations and leg swelling.  ?Gastrointestinal:  Negative for abdominal distention, abdominal pain, constipation and diarrhea.  ?Genitourinary:  Negative for difficulty urinating and dysuria.  ?Musculoskeletal:  Positive for arthralgias and gait problem. Negative for back pain, joint swelling and myalgias.  ?     Chronic left hip and knee pain  ?Neurological:  Negative for dizziness, tremors, seizures, syncope, facial asymmetry, speech difficulty, weakness, light-headedness,  numbness and headaches.  ?Psychiatric/Behavioral:  Negative for agitation, behavioral problems and confusion.   ?     Memory loss  ? ?Health Maintenance  ?Topic Date Due  ? Zoster Vaccines- Shingrix (1 of 2) Never done  ? COVID-19 Vaccine (4 - Booster) 11/15/2020  ? INFLUENZA VACCINE  12/27/2021  ? TETANUS/TDAP  12/12/2022  ? Pneumonia Vaccine 1+ Years old  Completed  ? DEXA SCAN  Completed  ? HPV VACCINES  Aged Out  ? ? ?Physical Exam: ?Vitals:  ? 10/03/21 1305  ?BP: (!) 148/98  ?  Pulse: 96  ?Temp: 97.7 ?F (36.5 ?C)  ?SpO2: 97%  ?Weight: 127 lb 6.4 oz (57.8 kg)  ?Height: 5' (1.524 m)  ? ?Body mass index is 24.88 kg/m?Marland Kitchen ?Physical Exam ?Vitals and nursing note reviewed.  ?Constitutional:   ?   General: She is not in acute distress. ?   Appearance: She is not diaphoretic.  ?HENT:  ?   Head: Normocephalic and atraumatic.  ?   Right Ear: There is impacted cerumen.  ?   Left Ear: There is impacted cerumen.  ?   Mouth/Throat:  ?   Mouth: Mucous membranes are moist.  ?   Pharynx: Oropharynx is clear.  ?Eyes:  ?   Conjunctiva/sclera: Conjunctivae normal.  ?   Pupils: Pupils are equal, round, and reactive to light.  ?Neck:  ?   Vascular: No JVD.  ?Cardiovascular:  ?   Rate and Rhythm: Normal rate and regular rhythm.  ?   Heart sounds: No murmur heard. ?Pulmonary:  ?   Effort: Pulmonary effort is normal. No respiratory distress.  ?   Breath sounds: Normal breath sounds. No wheezing.  ?Abdominal:  ?   General: Bowel sounds are normal. There is no distension.  ?   Palpations: Abdomen is soft.  ?   Tenderness: There is no abdominal tenderness.  ?Musculoskeletal:  ?   Right lower leg: No edema.  ?   Left lower leg: No edema.  ?Skin: ?   General: Skin is warm and dry.  ?   Comments: Dry skin. Macular rash in small scaly areas to the chest  ?Neurological:  ?   Mental Status: She is alert and oriented to person, place, and time.  ?Psychiatric:     ?   Mood and Affect: Mood normal.  ? ? ?Labs reviewed: ?Basic Metabolic Panel: ?Recent  Labs  ?  12/23/20 ?0000 09/15/21 ?0000  ?NA 141 140  ?K 4.0 4.1  ?CL 102 103  ?CO2 24* 25*  ?BUN 23* 21  ?CREATININE 0.6 0.6  ?CALCIUM 9.3 9.6  ?TSH 2.79  --   ? ?Liver Function Tests: ?Recent Labs  ?  07/2

## 2021-10-03 NOTE — Assessment & Plan Note (Signed)
Reports regular BMs ?Continue senokot and prn miralax.  ?

## 2021-10-03 NOTE — Assessment & Plan Note (Addendum)
Goal bp <150/90 ?Continue Losartan ?

## 2021-10-03 NOTE — Assessment & Plan Note (Signed)
Not currently on meds ?Check TSH next visit.  ?

## 2021-10-05 DIAGNOSIS — R54 Age-related physical debility: Secondary | ICD-10-CM | POA: Diagnosis not present

## 2021-10-05 DIAGNOSIS — M25562 Pain in left knee: Secondary | ICD-10-CM | POA: Diagnosis not present

## 2021-10-05 DIAGNOSIS — R2689 Other abnormalities of gait and mobility: Secondary | ICD-10-CM | POA: Diagnosis not present

## 2021-10-05 DIAGNOSIS — M25552 Pain in left hip: Secondary | ICD-10-CM | POA: Diagnosis not present

## 2021-10-05 DIAGNOSIS — R2681 Unsteadiness on feet: Secondary | ICD-10-CM | POA: Diagnosis not present

## 2021-10-05 DIAGNOSIS — M62561 Muscle wasting and atrophy, not elsewhere classified, right lower leg: Secondary | ICD-10-CM | POA: Diagnosis not present

## 2021-10-05 DIAGNOSIS — M7989 Other specified soft tissue disorders: Secondary | ICD-10-CM | POA: Diagnosis not present

## 2021-10-05 DIAGNOSIS — M62562 Muscle wasting and atrophy, not elsewhere classified, left lower leg: Secondary | ICD-10-CM | POA: Diagnosis not present

## 2021-10-11 ENCOUNTER — Ambulatory Visit (INDEPENDENT_AMBULATORY_CARE_PROVIDER_SITE_OTHER): Payer: Medicare HMO | Admitting: Nurse Practitioner

## 2021-10-11 ENCOUNTER — Telehealth: Payer: Self-pay

## 2021-10-11 ENCOUNTER — Encounter: Payer: Self-pay | Admitting: Nurse Practitioner

## 2021-10-11 DIAGNOSIS — Z0000 Encounter for general adult medical examination without abnormal findings: Secondary | ICD-10-CM | POA: Diagnosis not present

## 2021-10-11 DIAGNOSIS — E2839 Other primary ovarian failure: Secondary | ICD-10-CM

## 2021-10-11 NOTE — Telephone Encounter (Signed)
Ms. ivie, savitt are scheduled for a virtual visit with your provider today.   ? ?Just as we do with appointments in the office, we must obtain your consent to participate.  Your consent will be active for this visit and any virtual visit you may have with one of our providers in the next 365 days.   ? ?If you have a MyChart account, I can also send a copy of this consent to you electronically.  All virtual visits are billed to your insurance company just like a traditional visit in the office.  As this is a virtual visit, video technology does not allow for your provider to perform a traditional examination.  This may limit your provider's ability to fully assess your condition.  If your provider identifies any concerns that need to be evaluated in person or the need to arrange testing such as labs, EKG, etc, we will make arrangements to do so.   ? ?Although advances in technology are sophisticated, we cannot ensure that it will always work on either your end or our end.  If the connection with a video visit is poor, we may have to switch to a telephone visit.  With either a video or telephone visit, we are not always able to ensure that we have a secure connection.   I need to obtain your verbal consent now.   Are you willing to proceed with your visit today?  ? ?[name] has provided verbal consent on 10/11/2021 for a virtual visit (video or telephone). ? ? ?Carroll Kinds, CMA ?10/11/2021  1:40 PM ?  ?

## 2021-10-11 NOTE — Progress Notes (Signed)
This service is provided via telemedicine ? ?No vital signs collected/recorded due to the encounter was a telemedicine visit.  ? ?Location of patient (ex: home, work):  Home ? ?Patient consents to a telephone visit:  Yes, see encounter dated 10/11/2021 ? ?Location of the provider (ex: office, home):  [place] ? ?Name of any referring provider:  Veleta Miners, MD ? ?Names of all persons participating in the telemedicine service and their role in the encounter:  Sherrie Mustache, Nurse Practitioner, Carroll Kinds, CMA, and patient.  ? ?Time spent on call:  12 minutes with medical assistant ? ?

## 2021-10-11 NOTE — Patient Instructions (Signed)
Vanessa Mason , ?Thank you for taking time to come for your Medicare Wellness Visit. I appreciate your ongoing commitment to your health goals. Please review the following plan we discussed and let me know if I can assist you in the future.  ? ?Screening recommendations/referrals: ?Colonoscopy aged out ?Mammogram age out  ?Bone Density -due at this time [phone] ?Recommended yearly ophthalmology/optometry visit for glaucoma screening and checkup ?Recommended yearly dental visit for hygiene and checkup ? ?Vaccinations: ?Influenza vaccine up to date ?Pneumococcal vaccine up to date ?Tdap vaccine up to date ?Shingles vaccine up to date   ? ?Advanced directives: on file.  ? ?Conditions/risks identified: advance age, fall risk ? ?Next appointment: yearly for awv ? ? ?Preventive Care 86 Years and Older, Female ?Preventive care refers to lifestyle choices and visits with your health care provider that can promote health and wellness. ?What does preventive care include? ?A yearly physical exam. This is also called an annual well check. ?Dental exams once or twice a year. ?Routine eye exams. Ask your health care provider how often you should have your eyes checked. ?Personal lifestyle choices, including: ?Daily care of your teeth and gums. ?Regular physical activity. ?Eating a healthy diet. ?Avoiding tobacco and drug use. ?Limiting alcohol use. ?Practicing safe sex. ?Taking low-dose aspirin every day. ?Taking vitamin and mineral supplements as recommended by your health care provider. ?What happens during an annual well check? ?The services and screenings done by your health care provider during your annual well check will depend on your age, overall health, lifestyle risk factors, and family history of disease. ?Counseling  ?Your health care provider may ask you questions about your: ?Alcohol use. ?Tobacco use. ?Drug use. ?Emotional well-being. ?Home and relationship well-being. ?Sexual activity. ?Eating habits. ?History  of falls. ?Memory and ability to understand (cognition). ?Work and work Statistician. ?Reproductive health. ?Screening  ?You may have the following tests or measurements: ?Height, weight, and BMI. ?Blood pressure. ?Lipid and cholesterol levels. These may be checked every 5 years, or more frequently if you are over 26 years old. ?Skin check. ?Lung cancer screening. You may have this screening every year starting at age 18 if you have a 30-pack-year history of smoking and currently smoke or have quit within the past 15 years. ?Fecal occult blood test (FOBT) of the stool. You may have this test every year starting at age 1. ?Flexible sigmoidoscopy or colonoscopy. You may have a sigmoidoscopy every 5 years or a colonoscopy every 10 years starting at age 28. ?Hepatitis C blood test. ?Hepatitis B blood test. ?Sexually transmitted disease (STD) testing. ?Diabetes screening. This is done by checking your blood sugar (glucose) after you have not eaten for a while (fasting). You may have this done every 1-3 years. ?Bone density scan. This is done to screen for osteoporosis. You may have this done starting at age 50. ?Mammogram. This may be done every 1-2 years. Talk to your health care provider about how often you should have regular mammograms. ?Talk with your health care provider about your test results, treatment options, and if necessary, the need for more tests. ?Vaccines  ?Your health care provider may recommend certain vaccines, such as: ?Influenza vaccine. This is recommended every year. ?Tetanus, diphtheria, and acellular pertussis (Tdap, Td) vaccine. You may need a Td booster every 10 years. ?Zoster vaccine. You may need this after age 21. ?Pneumococcal 13-valent conjugate (PCV13) vaccine. One dose is recommended after age 64. ?Pneumococcal polysaccharide (PPSV23) vaccine. One dose is recommended after age 86. ?Talk  to your health care provider about which screenings and vaccines you need and how often you need  them. ?This information is not intended to replace advice given to you by your health care provider. Make sure you discuss any questions you have with your health care provider. ?Document Released: 06/11/2015 Document Revised: 02/02/2016 Document Reviewed: 03/16/2015 ?Elsevier Interactive Patient Education ? 2017 [place]. ? ?Fall Prevention in the Home ?Falls can cause injuries. They can happen to people of all ages. There are many things you can do to make your home safe and to help prevent falls. ?What can I do on the outside of my home? ?Regularly fix the edges of walkways and driveways and fix any cracks. ?Remove anything that might make you trip as you walk through a door, such as a raised step or threshold. ?Trim any bushes or trees on the path to your home. ?Use bright outdoor lighting. ?Clear any walking paths of anything that might make someone trip, such as rocks or tools. ?Regularly check to see if handrails are loose or broken. Make sure that both sides of any steps have handrails. ?Any raised decks and porches should have guardrails on the edges. ?Have any leaves, snow, or ice cleared regularly. ?Use sand or salt on walking paths during winter. ?Clean up any spills in your garage right away. This includes oil or grease spills. ?What can I do in the bathroom? ?Use night lights. ?Install grab bars by the toilet and in the tub and shower. Do not use towel bars as grab bars. ?Use non-skid mats or decals in the tub or shower. ?If you need to sit down in the shower, use a plastic, non-slip stool. ?Keep the floor dry. Clean up any water that spills on the floor as soon as it happens. ?Remove soap buildup in the tub or shower regularly. ?Attach bath mats securely with double-sided non-slip rug tape. ?Do not have throw rugs and other things on the floor that can make you trip. ?What can I do in the bedroom? ?Use night lights. ?Make sure that you have a light by your bed that is easy to reach. ?Do not use  any sheets or blankets that are too big for your bed. They should not hang down onto the floor. ?Have a firm chair that has side arms. You can use this for support while you get dressed. ?Do not have throw rugs and other things on the floor that can make you trip. ?What can I do in the kitchen? ?Clean up any spills right away. ?Avoid walking on wet floors. ?Keep items that you use a lot in easy-to-reach places. ?If you need to reach something above you, use a strong step stool that has a grab bar. ?Keep electrical cords out of the way. ?Do not use floor polish or wax that makes floors slippery. If you must use wax, use non-skid floor wax. ?Do not have throw rugs and other things on the floor that can make you trip. ?What can I do with my stairs? ?Do not leave any items on the stairs. ?Make sure that there are handrails on both sides of the stairs and use them. Fix handrails that are broken or loose. Make sure that handrails are as long as the stairways. ?Check any carpeting to make sure that it is firmly attached to the stairs. Fix any carpet that is loose or worn. ?Avoid having throw rugs at the top or bottom of the stairs. If you do have throw rugs,  attach them to the floor with carpet tape. ?Make sure that you have a light switch at the top of the stairs and the bottom of the stairs. If you do not have them, ask someone to add them for you. ?What else can I do to help prevent falls? ?Wear shoes that: ?Do not have high heels. ?Have rubber bottoms. ?Are comfortable and fit you well. ?Are closed at the toe. Do not wear sandals. ?If you use a stepladder: ?Make sure that it is fully opened. Do not climb a closed stepladder. ?Make sure that both sides of the stepladder are locked into place. ?Ask someone to hold it for you, if possible. ?Clearly mark and make sure that you can see: ?Any grab bars or handrails. ?First and last steps. ?Where the edge of each step is. ?Use tools that help you move around (mobility aids)  if they are needed. These include: ?Canes. ?Walkers. ?Scooters. ?Crutches. ?Turn on the lights when you go into a dark area. Replace any light bulbs as soon as they burn out. ?Set up your furniture so you h

## 2021-10-11 NOTE — Progress Notes (Signed)
? ?Subjective:  ? Vanessa Mason is a 88 y.o. female who presents for Medicare Annual (Subsequent) preventive examination. ? ?Review of Systems    ? ?Cardiac Risk Factors include: advanced age (>71mn, >>62women);hypertension ? ?   ?Objective:  ?  ?There were no vitals filed for this visit. ?There is no height or weight on file to calculate BMI. ? ? ?  10/11/2021  ?  1:30 PM 10/03/2021  ?  1:06 PM 09/30/2021  ?  3:30 PM 09/13/2021  ?  8:35 AM 07/01/2021  ?  2:25 PM 06/15/2021  ?  9:12 AM 05/13/2021  ?  9:05 AM  ?Advanced Directives  ?Does Patient Have a Medical Advance Directive? Yes Yes Yes Yes Yes Yes Yes  ?Type of AParamedicof [place] will;Out of facility DNR (pink MOST or yellow form) [place] will;Out of facility DNR (pink MOST or yellow form) [place] will;Out of facility DNR (pink MOST or yellow form) [place]Living will;Out of facility DNR (pink MOST or yellow form) Living will;Out of facility DNR (pink MOST or yellow form) Living will;Out of facility DNR (pink MOST or yellow form) Living will;Out of facility DNR (pink MOST or yellow form)  ?Does patient want to make changes to medical advance directive? No - Patient declined No - Patient declined No - Patient declined No - Patient declined No - Patient declined No - Patient declined No - Patient declined  ?Copy of HVayasin Chart? Yes - validated most recent copy scanned in chart (See row information) Yes - validated most recent copy scanned in chart (See row information) Yes - validated most recent copy scanned in chart (See row information) Yes - validated most recent copy scanned in chart (See row information)     ?Pre-existing out of facility DNR order (yellow form or pink MOST form) Yellow form placed in chart (order not valid for inpatient use) Yellow form placed in chart (order not valid for inpatient use) Yellow form  placed in chart (order not valid for inpatient use) Yellow form placed in chart (order not valid for inpatient use) Yellow form placed in chart (order not valid for inpatient use) Yellow form placed in chart (order not valid for inpatient use) Yellow form placed in chart (order not valid for inpatient use)  ? ? ?Current Medications (verified) ?Outpatient Encounter Medications as of 10/11/2021  ?Medication Sig  ? acetaminophen (TYLENOL) 500 MG tablet Take 1,000 mg by mouth in the morning and at bedtime.  ? Ascorbic Acid (VITAMIN C) 1000 MG tablet Take 1,000 mg by mouth daily.  ? Carboxymethylcellulose Sodium (THERATEARS) 0.25 % SOLN Apply 2-3 drops to eye in the morning, at noon, in the evening, and at bedtime.  ? Cholecalciferol (VITAMIN D3) 50 MCG (2000 UT) TABS Take 1 tablet by mouth daily.  ? DULoxetine (CYMBALTA) 20 MG capsule Take 1 capsule (20 mg total) by mouth daily.  ? losartan (COZAAR) 50 MG tablet Take 1.5 tablets (75 mg total) by mouth daily.  ? Multiple Vitamins-Minerals (PRESERVISION AREDS 2+MULTI VIT PO) Take by mouth 2 (two) times daily.  ? Omega-3 1000 MG CAPS Take one capsule by mouth three times daily.  ? polyethylene glycol powder (GLYCOLAX/MIRALAX) 17 GM/SCOOP powder Take 17 g by mouth daily as needed.  ? Pregnenolone 50 MG TABS Take 1 tablet by mouth every morning.  ? sennosides-docusate sodium (SENOKOT-S) 8.6-50 MG tablet Take 2 tablets by mouth daily.  ? ?  No facility-administered encounter medications on file as of 10/11/2021.  ? ? ?Allergies (verified) ?Naproxen, Nsaids, Aspirin, Ace inhibitors, and Caffeine  ? ?History: ?Past Medical History:  ?Diagnosis Date  ? Arthritis   ? "all over"  ? BACK PAIN 03/08/2007  ? Cancer [facility])   ? squamous cell on R leg & face- ?basal cell  ? DEPRESSIVE DISORDER 12/01/2008  ? Duodenal ulcer   ? GI bleed 09/12/2010  ? Naproxen induced Endoscopy with dr Harlene Ramus   ? HIATAL HERNIA 12/17/2006  ? History of hiatal hernia   ? HYPERLIPIDEMIA 03/09/2006  ? HYPERTENSION  03/09/2006  ? KNEE PAIN 05/06/2009  ? Macular degeneration   ? Pneumonia   ? 85yr. ago- hosp. pneumonia  ? Urinary urgency   ? ?Past Surgical History:  ?Procedure Laterality Date  ? ABDOMINAL HYSTERECTOMY  1975  ? APPLICATION OF A-CELL OF EXTREMITY Right 12/10/2014  ? Procedure: APPLICATION OF A-CELL OF EXTREMITY;  Surgeon: CTheodoro Kos DO;  [place]  Service: Plastics;  Laterality: Right;  ? arthroscopic knee Right   ? x 2  ? BACK SURGERY  2000  ? spinal fusion  ? BILATERAL SALPINGECTOMY Bilateral 1989  ? Dr. LMarianna Fuss ? CARDIAC CATHETERIZATION    ? EYE SURGERY    ? cataracts bilateral - removed  ? HERNIA REPAIR Bilateral   ? inguinal   ? I & D EXTREMITY Right 12/10/2014  ? Procedure: IRRIGATION AND DEBRIDEMENT ON RIGHT LEG, ;  Surgeon: CTheodoro Kos DO;  [place] [facility] [place]  Service: Plastics;  Laterality: Right;  ? I & D EXTREMITY Right 01/20/2015  ? Procedure: IRRIGATION AND DEBRIDEMENT RIGHT LOWER LEG WOUND, with ACELL to Donor Site, SKIN GRAFT AND VAC Placement;  Surgeon: CTheodoro Kos DO;  [place]  Service: [place];  Laterality: Right;  ? LUMBAR LAMINECTOMY  02-17-1999  ? with sinal fusion L3,4,5,&S1  ? moes    ? moses surgery on R lle  ? OOPHORECTOMY  1989  ? RESECTION DISTAL CLAVICAL    ? ROTATOR CUFF REPAIR Right 2001  ? x2  ? SKIN GRAFT Right 05/29/2018  ? THYROIDECTOMY    ? TONSILLECTOMY  1928  ? TUBAL LIGATION    ? ?Family History  ?Problem Relation Age of Onset  ? Cancer Mother 867 ? Cancer Brother   ? ?Social History  ? ?Socioeconomic History  ? Marital status: Widowed  ?  Spouse name: Not on file  ? Number of children: 5  ? Years of education: Not on file  ? Highest education level: Not on file  ?Occupational History  ? Not on file  ?Tobacco Use  ? Smoking status: Former  ?  Years: 16.00  ?  Types: Cigarettes  ? Smokeless tobacco: Never  ? Tobacco comments:  ?  Quit at age 88 ?Vaping Use  ? Vaping Use: Never used  ?Substance and Sexual Activity  ? Alcohol use: Yes  ?  Alcohol/week: 7.0  standard drinks  ?  Types: 7 Glasses of wine per week  ?  Comment: wine occasionally @ dinner  ? Drug use: No  ? Sexual activity: Not on file  ?Other Topics Concern  ? Not on file  ?Social History Narrative  ? ** Merged History Encounter **  ?    [facility] ?Never smoked ?Alcohol wine at night ?Exercise classes 3-5 times a week  ?POA, DNR, Living Will ?  ? ?Social Determinants of Health  ? ?FEmergency planning/management officer  Strain: Not on file  ?Food Insecurity: Not on file  ?Transportation Needs: Not on file  ?Physical Activity: Not on file  ?Stress: Not on file  ?Social Connections: Not on file  ? ? ?Tobacco Counseling ?Counseling given: Not Answered ?Tobacco comments: Quit at age 65 ? ? ?Clinical Intake: ? ?Pre-visit preparation completed: Yes ? ?Pain : No/denies pain ? ?  ? ?BMI - recorded: 24 ?Nutritional Status: BMI of 19-24  Normal ?Nutritional Risks: None ?Diabetes: No ? ?How often do you need to have someone help you when you read instructions, pamphlets, or other written materials from your doctor or pharmacy?: 3 - Sometimes ? ?Diabetic?no ? ?  ? ?  ? ? ?Activities of Daily Living ? ?  10/11/2021  ?  1:41 PM  ?In your present state of health, do you have any difficulty performing the following activities:  ?Hearing? 1  ?Vision? 1  ?Difficulty concentrating or making decisions? 0  ?Walking or climbing stairs? 1  ?Dressing or bathing? 0  ?Doing errands, shopping? 1  ?Comment does not drive  ?Preparing Food and eating ? N  ?Using the Toilet? N  ?In the past six months, have you accidently leaked urine? Y  ?Do you have problems with loss of bowel control? N  ?Managing your Medications? N  ?Managing your Finances? N  ?Housekeeping or managing your Housekeeping? N  ? ? ?Patient Care Team: ?Virgie Dad, MD as PCP - General (Internal Medicine) ?Ronald Lobo, MD as Consulting Physician (Gastroenterology) ?Vickey Huger, MD as Consulting Physician (Orthopedic Surgery) ?Izora Ribas as  Consulting Physician (Dermatology) ?Darlin Coco, MD as Consulting Physician (Cardiology) ?Luberta Mutter, MD as Consulting Physician (Ophthalmology) ?Karin Golden, MD as Referring Physician (Dermatology) ?

## 2021-10-13 DIAGNOSIS — Z886 Allergy status to analgesic agent status: Secondary | ICD-10-CM | POA: Diagnosis not present

## 2021-10-13 DIAGNOSIS — Z9181 History of falling: Secondary | ICD-10-CM | POA: Diagnosis not present

## 2021-10-13 DIAGNOSIS — Z87891 Personal history of nicotine dependence: Secondary | ICD-10-CM | POA: Diagnosis not present

## 2021-10-13 DIAGNOSIS — Z604 Social exclusion and rejection: Secondary | ICD-10-CM | POA: Diagnosis not present

## 2021-10-13 DIAGNOSIS — H547 Unspecified visual loss: Secondary | ICD-10-CM | POA: Diagnosis not present

## 2021-10-13 DIAGNOSIS — I10 Essential (primary) hypertension: Secondary | ICD-10-CM | POA: Diagnosis not present

## 2021-10-13 DIAGNOSIS — R32 Unspecified urinary incontinence: Secondary | ICD-10-CM | POA: Diagnosis not present

## 2021-10-13 DIAGNOSIS — M1990 Unspecified osteoarthritis, unspecified site: Secondary | ICD-10-CM | POA: Diagnosis not present

## 2021-10-13 DIAGNOSIS — R69 Illness, unspecified: Secondary | ICD-10-CM | POA: Diagnosis not present

## 2021-10-31 DIAGNOSIS — H35423 Microcystoid degeneration of retina, bilateral: Secondary | ICD-10-CM | POA: Diagnosis not present

## 2021-10-31 DIAGNOSIS — H353112 Nonexudative age-related macular degeneration, right eye, intermediate dry stage: Secondary | ICD-10-CM | POA: Diagnosis not present

## 2021-10-31 DIAGNOSIS — H353221 Exudative age-related macular degeneration, left eye, with active choroidal neovascularization: Secondary | ICD-10-CM | POA: Diagnosis not present

## 2021-10-31 DIAGNOSIS — H43813 Vitreous degeneration, bilateral: Secondary | ICD-10-CM | POA: Diagnosis not present

## 2021-11-14 ENCOUNTER — Encounter: Payer: Self-pay | Admitting: Adult Health

## 2021-11-14 ENCOUNTER — Non-Acute Institutional Stay: Payer: Medicare HMO | Admitting: Adult Health

## 2021-11-14 VITALS — BP 138/78 | HR 94 | Temp 97.7°F | Ht 60.0 in | Wt 126.0 lb

## 2021-11-14 DIAGNOSIS — K432 Incisional hernia without obstruction or gangrene: Secondary | ICD-10-CM | POA: Diagnosis not present

## 2021-11-14 NOTE — Progress Notes (Signed)
Location:  [facility]  POS: Clinic  Provider: Royal Hawthorn, ANP  Code Status: DNR Goals of Care:     11/14/2021    3:01 PM  Advanced Directives  Does Patient Have a Medical Advance Directive? Yes  Type of Paramedic of [place];Living will;Out of facility DNR (pink MOST or yellow form)  Does patient want to make changes to medical advance directive? No - Patient declined  Copy of [place] in Chart? Yes - validated most recent copy scanned in chart (See row information)  Pre-existing out of facility DNR order (yellow form or pink MOST form) Yellow form placed in chart (order not valid for inpatient use)     Chief Complaint  Patient presents with   Acute Visit    Patient returns to the clinic for hernia issues.    HPI: Patient is a 88 y.o. female seen today for medical management of chronic diseases.   She is here because she is concerned about her incisional hernia. She says it does not cause pain but does bother her at times. . NO issues with appetite. No nausea, or vomiting, or fever. Has hx of inguinal hernia repair, and abd hysterectomy. She has some memory loss and has been seen before for this issue and we let her know that 89 she would not be a good surgical candidate.     Past Medical History:  Diagnosis Date   Arthritis    "all over"   BACK PAIN 03/08/2007   Cancer ([place])    squamous cell on R leg & face- ?basal cell   DEPRESSIVE DISORDER 12/01/2008   Duodenal ulcer    GI bleed 09/12/2010   Naproxen induced Endoscopy with dr Harlene Ramus    HIATAL HERNIA 12/17/2006   History of hiatal hernia    HYPERLIPIDEMIA 03/09/2006   HYPERTENSION 03/09/2006   KNEE PAIN 05/06/2009   Macular degeneration    Pneumonia    65yr. ago- hosp. pneumonia   Urinary urgency     Past Surgical History:  Procedure Laterality Date   ABDOMINAL HYSTERECTOMY  13016  APPLICATION OF A-CELL OF EXTREMITY Right 12/10/2014   Procedure: APPLICATION OF  A-CELL OF EXTREMITY;  Surgeon: CTheodoro Kos DO;  [place]  Service: Plastics;  Laterality: Right;   arthroscopic knee Right    x 2   BACK SURGERY  2000   spinal fusion   BILATERAL SALPINGECTOMY Bilateral 1989   Dr. LMarianna Fuss  CARDIAC CATHETERIZATION     EYE SURGERY     cataracts bilateral - removed   HERNIA REPAIR Bilateral    inguinal    I & D EXTREMITY Right 12/10/2014   Procedure: IRRIGATION AND DEBRIDEMENT ON RIGHT LEG, ;  Surgeon: CTheodoro Kos DO;  [place]  Service: Plastics;  Laterality: Right;   I & D EXTREMITY Right 01/20/2015   Procedure: IRRIGATION AND DEBRIDEMENT RIGHT LOWER LEG WOUND, with ACELL to Donor Site, SKIN GRAFT AND VAC Placement;  Surgeon: CTheodoro Kos DO;  [place] [facility]  Service: Plastics;  Laterality: Right;   LUMBAR LAMINECTOMY  02-17-1999   with sinal fusion L3,4,5,&S1   moes     moses surgery on R lle   OOPHORECTOMY  1989   RESECTION DISTAL CLAVICAL     ROTATOR CUFF REPAIR Right 2001   x2   SKIN GRAFT Right 05/29/2018   THYROIDECTOMY     TONSILLECTOMY  1928   TUBAL LIGATION      Allergies  Allergen Reactions   Naproxen     Gi bleed   Nsaids     GI BLEED   Aspirin     GI bleed   Ace Inhibitors     Cough    Caffeine Other (See Comments)    Causes joints to be painful    Outpatient Encounter Medications as of 11/14/2021  Medication Sig   acetaminophen (TYLENOL) 500 MG tablet Take 1,000 mg by mouth in the morning and at bedtime.   Ascorbic Acid (VITAMIN C) 1000 MG tablet Take 1,000 mg by mouth daily.   Carboxymethylcellulose Sodium (THERATEARS) 0.25 % SOLN Apply 2-3 drops to eye in the morning, at noon, in the evening, and at bedtime.   Cholecalciferol (VITAMIN D3) 50 MCG (2000 UT) TABS Take 1 tablet by mouth daily.   DULoxetine (CYMBALTA) 20 MG capsule Take 1 capsule (20 mg total) by mouth daily.   losartan (COZAAR) 50 MG tablet Take 1.5 tablets (75 mg total) by mouth daily.   Multiple Vitamins-Minerals (PRESERVISION  AREDS 2+MULTI VIT PO) Take by mouth 2 (two) times daily.   Omega-3 1000 MG CAPS Take one capsule by mouth three times daily.   polyethylene glycol powder (GLYCOLAX/MIRALAX) 17 GM/SCOOP powder Take 17 g by mouth daily as needed.   Pregnenolone 50 MG TABS Take 1 tablet by mouth every morning.   sennosides-docusate sodium (SENOKOT-S) 8.6-50 MG tablet Take 2 tablets by mouth daily.   No facility-administered encounter medications on file as of 11/14/2021.    Review of Systems:  Review of Systems  Constitutional:  Negative for activity change, appetite change, chills, diaphoresis, fatigue, fever and unexpected weight change.  HENT:  Negative for congestion.   Respiratory:  Negative for cough, shortness of breath and wheezing.   Cardiovascular:  Negative for chest pain, palpitations and leg swelling.  Gastrointestinal:  Negative for abdominal distention, abdominal pain, constipation and diarrhea.  Genitourinary:  Negative for difficulty urinating and dysuria.  Musculoskeletal:  Positive for arthralgias and gait problem. Negative for back pain, joint swelling and myalgias.  Neurological:  Negative for dizziness, tremors, seizures, syncope, facial asymmetry, speech difficulty, weakness, light-headedness, numbness and headaches.  Psychiatric/Behavioral:  Negative for agitation, behavioral problems and confusion.        Memory loss    Health Maintenance  Topic Date Due   Zoster Vaccines- Shingrix (1 of 2) 01/03/2022 (Originally 08/19/1936)   INFLUENZA VACCINE  12/27/2021   TETANUS/TDAP  12/12/2022   Pneumonia Vaccine 65+ Years old  Completed   DEXA SCAN  Completed   COVID-19 Vaccine  Completed   HPV VACCINES  Aged Out    Physical Exam: Vitals:   11/14/21 1500  BP: 138/78  Pulse: 94  Temp: 97.7 F (36.5 C)  SpO2: 91%  Weight: 126 lb (57.2 kg)  Height: 5' (1.524 m)   Body mass index is 24.61 kg/m. Physical Exam Vitals and nursing note reviewed.  Constitutional:      General: She  is not in acute distress.    Appearance: She is not diaphoretic.  HENT:     Head: Normocephalic and atraumatic.  Neck:     Vascular: No JVD.  Cardiovascular:     Rate and Rhythm: Normal rate and regular rhythm.     Heart sounds: No murmur heard. Pulmonary:     Effort: Pulmonary effort is normal. No respiratory distress.     Breath sounds: Normal breath sounds. No wheezing.  Abdominal:     General: Bowel sounds are normal. There is  no distension.     Palpations: There is no mass.     Tenderness: There is no abdominal tenderness. There is no right CVA tenderness, left CVA tenderness, guarding or rebound.     Hernia: A hernia (midline, not reducible. No redness, tenderness, bruising, or swelling.) is present.  Skin:    General: Skin is warm and dry.  Neurological:     Mental Status: She is alert and oriented to person, place, and time.     Labs reviewed: Basic Metabolic Panel: Recent Labs    12/23/20 0000 09/15/21 0000  NA 141 140  K 4.0 4.1  CL 102 103  CO2 24* 25*  BUN 23* 21  CREATININE 0.6 0.6  CALCIUM 9.3 9.6  TSH 2.79  --    Liver Function Tests: Recent Labs    12/23/20 0000 09/15/21 0000  AST 22 21  ALT 13 14  ALKPHOS 73 57  ALBUMIN 4.4 4.5   No results for input(s): "LIPASE", "AMYLASE" in the last 8760 hours. No results for input(s): "AMMONIA" in the last 8760 hours. CBC: Recent Labs    12/23/20 0000 09/15/21 0000  WBC 3.8 4.9  HGB 11.1* 12.2  HCT 33* 37  PLT 152 152   Lipid Panel: No results for input(s): "CHOL", "HDL", "LDLCALC", "TRIG", "CHOLHDL", "LDLDIRECT" in the last 8760 hours. No results found for: "HGBA1C"  Procedures since last visit: No results found.  Assessment/Plan  Incisional hernia, without obstruction or gangrene Not a surgical candidate No acute changes are noted.  Wear binder for comfort Avoid constipation Report abd pain, fever, vomiting, etc.   Labs/tests ordered:  * No order type specified * Next appt:   01/04/2022   Total time 55mn:  time greater than 50% of total time spent doing pt counseling and coordination of care

## 2021-11-16 DIAGNOSIS — R229 Localized swelling, mass and lump, unspecified: Secondary | ICD-10-CM | POA: Diagnosis not present

## 2021-11-16 DIAGNOSIS — Z08 Encounter for follow-up examination after completed treatment for malignant neoplasm: Secondary | ICD-10-CM | POA: Diagnosis not present

## 2021-11-16 DIAGNOSIS — C44329 Squamous cell carcinoma of skin of other parts of face: Secondary | ICD-10-CM | POA: Diagnosis not present

## 2021-11-16 DIAGNOSIS — D485 Neoplasm of uncertain behavior of skin: Secondary | ICD-10-CM | POA: Diagnosis not present

## 2021-11-16 DIAGNOSIS — L821 Other seborrheic keratosis: Secondary | ICD-10-CM | POA: Diagnosis not present

## 2021-11-16 DIAGNOSIS — Z85828 Personal history of other malignant neoplasm of skin: Secondary | ICD-10-CM | POA: Diagnosis not present

## 2021-11-16 DIAGNOSIS — C44529 Squamous cell carcinoma of skin of other part of trunk: Secondary | ICD-10-CM | POA: Diagnosis not present

## 2021-12-02 ENCOUNTER — Ambulatory Visit (INDEPENDENT_AMBULATORY_CARE_PROVIDER_SITE_OTHER): Payer: Medicare HMO | Admitting: Surgical

## 2021-12-02 ENCOUNTER — Ambulatory Visit: Payer: Self-pay

## 2021-12-02 DIAGNOSIS — M25552 Pain in left hip: Secondary | ICD-10-CM | POA: Diagnosis not present

## 2021-12-02 DIAGNOSIS — M1712 Unilateral primary osteoarthritis, left knee: Secondary | ICD-10-CM

## 2021-12-04 ENCOUNTER — Encounter: Payer: Self-pay | Admitting: Surgical

## 2021-12-04 MED ORDER — METHYLPREDNISOLONE ACETATE 40 MG/ML IJ SUSP
40.0000 mg | INTRAMUSCULAR | Status: AC | PRN
  Administered 2021-12-02: 40 mg via INTRA_ARTICULAR

## 2021-12-04 MED ORDER — LIDOCAINE HCL 1 % IJ SOLN
5.0000 mL | INTRAMUSCULAR | Status: AC | PRN
  Administered 2021-12-02: 5 mL

## 2021-12-04 MED ORDER — BUPIVACAINE HCL 0.25 % IJ SOLN
4.0000 mL | INTRAMUSCULAR | Status: AC | PRN
  Administered 2021-12-02: 4 mL via INTRA_ARTICULAR

## 2021-12-04 NOTE — Progress Notes (Signed)
Office Visit Note   Patient: Vanessa Mason           Date of Birth: [birth year]           MRN: [id number] Visit Date: 12/02/2021 Requested by: Virgie Dad, MD [place],  [place] [id number] PCP: Virgie Dad, MD  Subjective: Chief Complaint  Patient presents with   Left Knee - Pain    HPI: Vanessa Mason is a 88 y.o. female who presents to the office complaining of left knee and left hip pain.  Patient reports similar complaints comparable to her last office visit in March 2023.  At that time, she had left knee hyaluronic acid injection and left trochanteric injection.  She is a poor historian due to her memory so she cannot say exactly whether or not it helped.  Spoke with her son, Vanessa Mason, on the phone today and he states that she did not really have much relief from the trochanteric hip injection or from the left knee injection.  No new injuries.  She lives at [facility] and does therapy there.  She wears a hinged knee brace to help with the stability of her knee.              ROS: All systems reviewed are negative as they relate to the chief complaint within the history of present illness.  Patient denies fevers or chills.  Assessment & Plan: Visit Diagnoses:  1. Greater trochanteric pain syndrome of left lower extremity   2. Arthritis of left knee     Plan: Patient is a 88 year old female who presents for reevaluation of left knee pain and left hip pain.  She has history of left knee arthritis and greater trochanteric pain syndrome of the left leg which she has had injections for in the past.  Injection in September 2022 seems like it gave her good relief as she did not follow-up until late January 2023.  However, last injection did not really give her much relief.  After discussion of options, plan to try different approach for left trochanteric injection.  Under ultrasound guidance, anterior, lateral, posterior facets of the greater trochanter  were identified and injection was delivered to the trochanteric bursa with a posterior approach.  Patient tolerated the procedure well and initially after the injection during the anesthetic phase, she had 100% relief of pain during reexamination.  Did not really have much relief from her left knee arthritic pain with gel injection, so plan to repeat cortisone injection today.  Patient tolerated the procedure well.  Plan to follow-up with the office as needed.  Note was written to [facility] to call our office to report how she is feeling in about a week.  That way we can know if these trochanteric injections helped her.  Follow-Up Instructions: No follow-ups on file.   Orders:  Orders Placed This Encounter  Procedures   US Guided Needle Placement - No Linked Charges   No orders of the defined types were placed in this encounter.     Procedures: Large Joint Inj: L knee on 12/02/2021 8:44 AM Indications: diagnostic evaluation, joint swelling and pain Details: 18 G 1.5 in needle, superolateral approach  Arthrogram: No  Medications: 5 mL lidocaine 1 %; 40 mg methylPREDNISolone acetate 40 MG/ML; 4 mL bupivacaine 0.25 % Outcome: tolerated well, no immediate complications Procedure, treatment alternatives, risks and benefits explained, specific risks discussed. Consent was given by the patient. Immediately prior to procedure a time out  was called to verify the correct patient, procedure, equipment, support staff and site/side marked as required. Patient was prepped and draped in the usual sterile fashion.    Large Joint Inj: L greater trochanter on 12/02/2021 8:44 AM Indications: pain and diagnostic evaluation Details: 22 G 3.5 in needle, ultrasound-guided posterior approach  Arthrogram: No  Medications: 5 mL lidocaine 1 %; 4 mL bupivacaine 0.25 %; 40 mg methylPREDNISolone acetate 40 MG/ML Outcome: tolerated well, no immediate complications Procedure, treatment alternatives, risks and benefits  explained, specific risks discussed. Consent was given by the patient. Immediately prior to procedure a time out was called to verify the correct patient, procedure, equipment, support staff and site/side marked as required. Patient was prepped and draped in the usual sterile fashion.       Clinical Data: No additional findings.  Objective: Vital Signs: There were no vitals taken for this visit.  Physical Exam:  Constitutional: Patient appears well-developed HEENT:  Head: Normocephalic Eyes:EOM are normal Neck: Normal range of motion Cardiovascular: Normal rate Pulmonary/chest: Effort normal Neurologic: Patient is alert Skin: Skin is warm Psychiatric: Patient has normal mood and affect  Ortho Exam: Ortho exam demonstrates left knee without effusion.  Tenderness over the medial and lateral joint lines of the left knee.  She is able to perform straight leg raise without extensor lag.  She has no pain with hip range of motion but does have moderate to severe tenderness over the left greater trochanter that is not present on the right side.  She ambulates with a walker.  No cellulitis or skin changes noted.  Specialty Comments:  No specialty comments available.  Imaging: No results found.   PMFS History: Patient Active Problem List   Diagnosis Date Noted   Major depressive disorder with single episode, in full remission ([place]) 07/28/2020   Trochanteric bursitis of left hip 04/09/2019   Word finding difficulty 04/09/2019   Mild cognitive impairment with memory loss 04/09/2019   Senile osteoporosis 12/27/2018   Chronic constipation 12/11/2014   Depression 06/18/2014   Actinic keratosis 02/10/2014   History of partial thyroidectomy 12/30/2013   Rash and nonspecific skin eruption 12/30/2013   DNR (do not resuscitate) 12/16/2013   LBBB (left bundle branch block) 07/30/2013   Osteoarthritis of left knee 06/17/2013   Hyperlipemia 09/12/2010   BACK PAIN 03/08/2007   HIATAL  HERNIA 12/17/2006   Essential hypertension 03/09/2006   Past Medical History:  Diagnosis Date   Arthritis    "all over"   BACK PAIN 03/08/2007   Cancer ([place])    squamous cell on R leg & face- ?basal cell   DEPRESSIVE DISORDER 12/01/2008   Duodenal ulcer    GI bleed 09/12/2010   Naproxen induced Endoscopy with dr Harlene Ramus    HIATAL HERNIA 12/17/2006   History of hiatal hernia    HYPERLIPIDEMIA 03/09/2006   HYPERTENSION 03/09/2006   KNEE PAIN 05/06/2009   Macular degeneration    Pneumonia    57yr. ago- hosp. pneumonia   Urinary urgency     Family History  Problem Relation Age of Onset   Cancer Mother 841  Cancer Brother     Past Surgical History:  Procedure Laterality Date   ABDOMINAL HYSTERECTOMY  11517  APPLICATION OF A-CELL OF EXTREMITY Right 12/10/2014   Procedure: APPLICATION OF A-CELL OF EXTREMITY;  Surgeon: CTheodoro Kos DO;  [facility] [facility]  Service: Plastics;  Laterality: Right;   arthroscopic knee Right    x 2   BACK SURGERY  2000   spinal fusion   BILATERAL SALPINGECTOMY Bilateral 1989   Dr. [place] Fuss   CARDIAC CATHETERIZATION     EYE SURGERY     cataracts bilateral - removed   HERNIA REPAIR Bilateral    inguinal    I & D EXTREMITY Right 12/10/2014   Procedure: IRRIGATION AND DEBRIDEMENT ON RIGHT LEG, ;  Surgeon: Theodoro Kos, DO;  Location: [facility];  Service: Plastics;  Laterality: Right;   I & D EXTREMITY Right 01/20/2015   Procedure: IRRIGATION AND DEBRIDEMENT RIGHT LOWER LEG WOUND, with ACELL to Donor Site, SKIN GRAFT AND VAC Placement;  Surgeon: Theodoro Kos, DO;  Location: [facility];  Service: Plastics;  Laterality: Right;   LUMBAR LAMINECTOMY  02-17-1999   with sinal fusion L3,4,5,&S1   moes     moses surgery on R lle   OOPHORECTOMY  1989   RESECTION DISTAL CLAVICAL     ROTATOR CUFF REPAIR Right 2001   x2   SKIN GRAFT Right 05/29/2018   THYROIDECTOMY     TONSILLECTOMY  1928   TUBAL LIGATION     Social History   Occupational History   Not on file   Tobacco Use   Smoking status: Former    Years: 16.00    Types: Cigarettes   Smokeless tobacco: Never   Tobacco comments:    Quit at age 55  Vaping Use   Vaping Use: Never used  Substance and Sexual Activity   Alcohol use: Yes    Alcohol/week: 7.0 standard drinks of alcohol    Types: 7 Glasses of wine per week    Comment: wine occasionally @ dinner   Drug use: No   Sexual activity: Not on file

## 2021-12-12 ENCOUNTER — Non-Acute Institutional Stay: Payer: Medicare HMO | Admitting: Adult Health

## 2021-12-12 ENCOUNTER — Encounter: Payer: Self-pay | Admitting: Adult Health

## 2021-12-12 DIAGNOSIS — R8290 Unspecified abnormal findings in urine: Secondary | ICD-10-CM | POA: Diagnosis not present

## 2021-12-12 DIAGNOSIS — R69 Illness, unspecified: Secondary | ICD-10-CM | POA: Diagnosis not present

## 2021-12-12 DIAGNOSIS — F05 Delirium due to known physiological condition: Secondary | ICD-10-CM | POA: Diagnosis not present

## 2021-12-12 NOTE — Progress Notes (Signed)
Location:  Occup[facility] psychologist of Service:  ALF (13) Provider:   Cindi Carbon, [place] [phone]   Virgie Dad, MD  Patient Care Team: Virgie Dad, MD as PCP - General (Internal Medicine) Ronald Lobo, MD as Consulting Physician (Gastroenterology) Vickey Huger, MD as Consulting Physician (Orthopedic Surgery) Izora Ribas as Consulting Physician (Dermatology) Darlin Coco, MD as Consulting Physician (Cardiology) Luberta Mutter, MD as Consulting Physician (Ophthalmology) Karin Golden, MD as Referring Physician (Dermatology)  Extended Emergency Contact Information Primary Emergency Contact: Clearence Ped of [place] Phone: [phone] Relation: Daughter Secondary Emergency Contact: [facility] Address: [place] [address] Johnnette Litter of [place] Phone: [phone] Mobile Phone: [phone] Relation: Son  Code Status:  DNR Goals of care: Advanced Directive information    11/14/2021    3:01 PM  Advanced Directives  Does Patient Have a Medical Advance Directive? Yes  Type of Paramedic of [place];Living will;Out of facility DNR (pink MOST or yellow form)  Does patient want to make changes to medical advance directive? No - Patient declined  Copy of [place] in Chart? Yes - validated most recent copy scanned in chart (See row information)  Pre-existing out of facility DNR order (yellow form or pink MOST form) Yellow form placed in chart (order not valid for inpatient use)     Chief Complaint  Patient presents with   Acute Visit    Confusion     HPI:  Pt is a 88 y.o. female seen today for an acute visit for confusion.   The nurses on AL have noticed that Ms. Little is more confused over the past week. She tends to forget why she is in AL and where is in the evening hrs. The nurse also says her urine is dark  and has a foul odor. Denies frequency, dysuria, fever.   Seems to be at baseline during the visit with no acute concern.   She did receive a steroid hip inj 12/02/21.     Past Medical History:  Diagnosis Date   Arthritis    "all over"   BACK PAIN 03/08/2007   Cancer ([place])    squamous cell on R leg & face- ?basal cell   DEPRESSIVE DISORDER 12/01/2008   Duodenal ulcer    GI bleed 09/12/2010   Naproxen induced Endoscopy with dr Harlene Ramus    HIATAL HERNIA 12/17/2006   History of hiatal hernia    HYPERLIPIDEMIA 03/09/2006   HYPERTENSION 03/09/2006   KNEE PAIN 05/06/2009   Macular degeneration    Pneumonia    59yr. ago- hosp. pneumonia   Urinary urgency    Past Surgical History:  Procedure Laterality Date   ABDOMINAL HYSTERECTOMY  14580  APPLICATION OF A-CELL OF EXTREMITY Right 12/10/2014   Procedure: APPLICATION OF A-CELL OF EXTREMITY;  Surgeon: CTheodoro Kos DO;  Location[place] [facility]  Service: Plastics;  Laterality: Right;   arthroscopic knee Right    x 2   BACK SURGERY  2000   spinal fusion   BILATERAL SALPINGECTOMY Bilateral 1989   Dr. LMarianna Fuss  CARDIAC CATHETERIZATION     EYE SURGERY     cataracts bilateral - removed   HERNIA REPAIR Bilateral    inguinal    I & D EXTREMITY Right 12/10/2014   Procedure: IRRIGATION AND DEBRIDEMENT ON RIGHT LEG, ;  Surgeon: CTheodoro Kos DO;  Location:  [facility];  Service: Plastics;  Laterality: Right;   I & D EXTREMITY Right 01/20/2015   Procedure: IRRIGATION AND DEBRIDEMENT RIGHT LOWER LEG WOUND, with ACELL to Donor Site, SKIN GRAFT AND VAC Placement;  Surgeon: Theodoro Kos, DO;  Location: [facility];  Service: Plastics;  Laterality: Right;   LUMBAR LAMINECTOMY  02-17-1999   with sinal fusion L3,4,5,&S1   moes     moses surgery on R lle   OOPHORECTOMY  1989   RESECTION DISTAL CLAVICAL     ROTATOR CUFF REPAIR Right 2001   x2   SKIN GRAFT Right 05/29/2018   THYROIDECTOMY     TONSILLECTOMY  1928   TUBAL LIGATION      Allergies  Allergen Reactions    Naproxen     Gi bleed   Nsaids     GI BLEED   Aspirin     GI bleed   Ace Inhibitors     Cough    Caffeine Other (See Comments)    Causes joints to be painful    Outpatient Encounter Medications as of 12/12/2021  Medication Sig   acetaminophen (TYLENOL) 500 MG tablet Take 1,000 mg by mouth in the morning and at bedtime.   Ascorbic Acid (VITAMIN C) 1000 MG tablet Take 1,000 mg by mouth daily.   Carboxymethylcellulose Sodium (THERATEARS) 0.25 % SOLN Apply 2-3 drops to eye in the morning, at noon, in the evening, and at bedtime.   Cholecalciferol (VITAMIN D3) 50 MCG (2000 UT) TABS Take 1 tablet by mouth daily.   DULoxetine (CYMBALTA) 20 MG capsule Take 1 capsule (20 mg total) by mouth daily.   losartan (COZAAR) 50 MG tablet Take 1.5 tablets (75 mg total) by mouth daily.   Multiple Vitamins-Minerals (PRESERVISION AREDS 2+MULTI VIT PO) Take by mouth 2 (two) times daily.   Omega-3 1000 MG CAPS Take one capsule by mouth three times daily.   polyethylene glycol powder (GLYCOLAX/MIRALAX) 17 GM/SCOOP powder Take 17 g by mouth daily as needed.   Pregnenolone 50 MG TABS Take 1 tablet by mouth every morning.   sennosides-docusate sodium (SENOKOT-S) 8.6-50 MG tablet Take 2 tablets by mouth daily.   No facility-administered encounter medications on file as of 12/12/2021.    Review of Systems  Constitutional:  Negative for activity change, appetite change, chills, diaphoresis, fatigue, fever and unexpected weight change.  HENT:  Negative for congestion.   Respiratory:  Negative for cough, shortness of breath and wheezing.   Cardiovascular:  Negative for chest pain, palpitations and leg swelling.  Gastrointestinal:  Negative for abdominal distention, abdominal pain, constipation and diarrhea.  Genitourinary:  Negative for difficulty urinating, dysuria, flank pain and frequency.  Musculoskeletal:  Positive for arthralgias and gait problem. Negative for back pain, joint swelling and myalgias.   Neurological:  Negative for dizziness, tremors, seizures, syncope, facial asymmetry, speech difficulty, weakness, light-headedness, numbness and headaches.  Psychiatric/Behavioral:  Positive for confusion. Negative for agitation and behavioral problems.     Immunization History  Administered Date(s) Administered   Fluad Quad(high Dose 65+) 04/05/2020   Influenza Split 03/08/2011, 03/05/2012   Influenza Whole 03/08/2007, 03/02/2009, 03/29/2010   Influenza, High Dose Seasonal PF 03/21/2013, 06/13/2017, 03/07/2019, 03/26/2020   Influenza,inj,Quad PF,6+ Mos 02/20/2014, 02/11/2015, 03/22/2018   Influenza-Unspecified 03/23/2016, 04/05/2020   Moderna Covid-19 Vaccine Bivalent Booster 58yr & up 03/10/2021   Moderna SARS-COV2 Booster Vaccination 09/20/2020   Moderna Sars-Covid-2 Vaccination 06/10/2019, 07/09/2019   PFIZER(Purple Top)SARS-COV-2 Vaccination 04/05/2020   Pneumococcal Conjugate-13 07/30/2014   Pneumococcal Polysaccharide-23 02/03/2002,  12/16/2009   Td 02/03/2002   Tdap 12/11/2012   Pertinent  Health Maintenance Due  Topic Date Due   INFLUENZA VACCINE  12/27/2021   DEXA SCAN  Completed      12/29/2020   10:25 AM 06/15/2021    9:12 AM 10/03/2021    1:06 PM 10/11/2021    1:27 PM 11/14/2021    3:01 PM  Fall Risk  Falls in the past year? 0 '1 1 1 1  '$ Was there an injury with Fall?  1 0 1 0  Fall Risk Category Calculator  '3 2 3 2  '$ Fall Risk Category  High Moderate High Moderate  Patient Fall Risk Level Low fall risk High fall risk High fall risk High fall risk Low fall risk  Patient at Risk for Falls Due to  Impaired mobility Impaired balance/gait History of fall(s) Impaired balance/gait  Fall risk Follow up Falls evaluation completed Falls evaluation completed Falls evaluation completed Falls evaluation completed Falls evaluation completed   Functional Status Survey:    Vitals:   12/12/21 1129  BP: (!) 153/77  Pulse: 81  Resp: 16  Temp: (!) 97 F (36.1 C)  SpO2: 95%    There is no height or weight on file to calculate BMI. Physical Exam Vitals and nursing note reviewed.  Constitutional:      General: She is not in acute distress.    Appearance: She is not diaphoretic.  HENT:     Head: Normocephalic and atraumatic.  Neck:     Vascular: No JVD.  Cardiovascular:     Rate and Rhythm: Normal rate and regular rhythm.     Heart sounds: No murmur heard. Pulmonary:     Effort: Pulmonary effort is normal. No respiratory distress.     Breath sounds: Normal breath sounds. No wheezing.  Abdominal:     General: Bowel sounds are normal. There is no distension.     Palpations: Abdomen is soft.     Tenderness: There is no abdominal tenderness. There is no right CVA tenderness or left CVA tenderness.  Skin:    General: Skin is warm and dry.  Neurological:     General: No focal deficit present.     Mental Status: She is alert. Mental status is at baseline.  Psychiatric:        Mood and Affect: Mood normal.     Labs reviewed: Recent Labs    12/23/20 0000 09/15/21 0000  NA 141 140  K 4.0 4.1  CL 102 103  CO2 24* 25*  BUN 23* 21  CREATININE 0.6 0.6  CALCIUM 9.3 9.6   Recent Labs    12/23/20 0000 09/15/21 0000  AST 22 21  ALT 13 14  ALKPHOS 73 57  ALBUMIN 4.4 4.5   Recent Labs    12/23/20 0000 09/15/21 0000  WBC 3.8 4.9  HGB 11.1* 12.2  HCT 33* 37  PLT 152 152   Lab Results  Component Value Date   TSH 2.79 12/23/2020   No results found for: "HGBA1C" Lab Results  Component Value Date   CHOL 238 (A) 08/20/2018   HDL 67 08/20/2018   LDLCALC 154 08/20/2018   LDLDIRECT 118.7 07/20/2011   TRIG 85 08/20/2018   CHOLHDL 4 12/30/2013    Significant Diagnostic Results in last 30 days:  No results found.  Assessment/Plan 1. Sundowning MMSE 26/30 01/25/21 Likely due to progression in dementia Her son was on face time during the visit and he will call her in  the afternoon to orient her and monitor her progress.   2. Foul smelling  urine Will check U A C and S No current symptoms     Family/ staff Communication: discussed with her son during the visit on face time.   Labs/tests ordered:  UA C and S

## 2021-12-13 DIAGNOSIS — R410 Disorientation, unspecified: Secondary | ICD-10-CM | POA: Diagnosis not present

## 2022-01-02 DIAGNOSIS — E039 Hypothyroidism, unspecified: Secondary | ICD-10-CM | POA: Diagnosis not present

## 2022-01-02 LAB — TSH: TSH: 1.39 (ref 0.41–5.90)

## 2022-01-03 ENCOUNTER — Encounter: Payer: Self-pay | Admitting: *Deleted

## 2022-01-04 ENCOUNTER — Encounter: Payer: Self-pay | Admitting: Internal Medicine

## 2022-01-04 ENCOUNTER — Non-Acute Institutional Stay: Payer: Medicare HMO | Admitting: Internal Medicine

## 2022-01-04 VITALS — BP 134/88 | HR 75 | Temp 97.2°F | Ht 60.0 in | Wt 124.8 lb

## 2022-01-04 DIAGNOSIS — M810 Age-related osteoporosis without current pathological fracture: Secondary | ICD-10-CM

## 2022-01-04 DIAGNOSIS — M1712 Unilateral primary osteoarthritis, left knee: Secondary | ICD-10-CM | POA: Diagnosis not present

## 2022-01-04 DIAGNOSIS — K429 Umbilical hernia without obstruction or gangrene: Secondary | ICD-10-CM | POA: Diagnosis not present

## 2022-01-04 DIAGNOSIS — E890 Postprocedural hypothyroidism: Secondary | ICD-10-CM | POA: Diagnosis not present

## 2022-01-04 DIAGNOSIS — M7062 Trochanteric bursitis, left hip: Secondary | ICD-10-CM | POA: Diagnosis not present

## 2022-01-04 DIAGNOSIS — K5909 Other constipation: Secondary | ICD-10-CM

## 2022-01-04 DIAGNOSIS — I10 Essential (primary) hypertension: Secondary | ICD-10-CM | POA: Diagnosis not present

## 2022-01-04 DIAGNOSIS — R69 Illness, unspecified: Secondary | ICD-10-CM | POA: Diagnosis not present

## 2022-01-04 DIAGNOSIS — K432 Incisional hernia without obstruction or gangrene: Secondary | ICD-10-CM

## 2022-01-04 DIAGNOSIS — R296 Repeated falls: Secondary | ICD-10-CM | POA: Diagnosis not present

## 2022-01-04 DIAGNOSIS — G3184 Mild cognitive impairment, so stated: Secondary | ICD-10-CM | POA: Diagnosis not present

## 2022-01-04 DIAGNOSIS — F325 Major depressive disorder, single episode, in full remission: Secondary | ICD-10-CM

## 2022-01-06 NOTE — Progress Notes (Unsigned)
Location:  [place] of Service:  Clinic (12)  Provider:   Code Status: DNR Goals of Care:     11/14/2021    3:01 PM  Advanced Directives  Does Patient Have a Medical Advance Directive? Yes  Type of Paramedic of [place];Living will;Out of facility DNR (pink MOST or yellow form)  Does patient want to make changes to medical advance directive? No - Patient declined  Copy of [place] in Chart? Yes - validated most recent copy scanned in chart (See row information)  Pre-existing out of facility DNR order (yellow form or pink MOST form) Yellow form placed in chart (order not valid for inpatient use)     Chief Complaint  Patient presents with   Medical Management of Chronic Issues    Medical Management of Chronic Issues. 3 Month Follow up    HPI: Patient is a 88 y.o. female seen today for medical management of chronic diseases.   Patient has h/o Osteoporosis ON [place] since June 21 T score of -4.5 in 11/20 H/o Trochanteric bursitis s/P Steroid Shots  Osteoarthritis specially Left Knee s/p Steroid shots by Dr Marlou Sa Depression   Now is in AL Sustained Rib fractures in 6,7,and 8 left side in 11/22 MCI Doing well Repeats herself   Here with her son and daughter-in-law Continues to obsess about her umbilical hernia.  She states she does not like it.  Also is getting little hard of hearing. Continues to have low back pain and knee pain but is able to walk with her walker.  Wants to know if she can get some extra Tylenol  gait very unsteady without the walker. Wt Readings from Last 3 Encounters:  01/04/22 124 lb 12.8 oz (56.6 kg)  11/14/21 126 lb (57.2 kg)  10/03/21 127 lb 6.4 oz (57.8 kg)     Past Medical History:  Diagnosis Date   Arthritis    "all over"   BACK PAIN 03/08/2007   Cancer ([place])    squamous cell on R leg & face- ?basal cell   DEPRESSIVE DISORDER 12/01/2008   Duodenal ulcer    GI  bleed 09/12/2010   Naproxen induced Endoscopy with dr Harlene Ramus    HIATAL HERNIA 12/17/2006   History of hiatal hernia    HYPERLIPIDEMIA 03/09/2006   HYPERTENSION 03/09/2006   KNEE PAIN 05/06/2009   Macular degeneration    Pneumonia    77yr. ago- hosp. pneumonia   Urinary urgency     Past Surgical History:  Procedure Laterality Date   ABDOMINAL HYSTERECTOMY  11610  APPLICATION OF A-CELL OF EXTREMITY Right 12/10/2014   Procedure: APPLICATION OF A-CELL OF EXTREMITY;  Surgeon: CTheodoro Kos DO;  Location: [facility]  Service: Plastics;  Laterality: Right;   arthroscopic knee Right    x 2   BACK SURGERY  2000   spinal fusion   BILATERAL SALPINGECTOMY Bilateral 1989   Dr. LMarianna Fuss  CARDIAC CATHETERIZATION     EYE SURGERY     cataracts bilateral - removed   HERNIA REPAIR Bilateral    inguinal    I & D EXTREMITY Right 12/10/2014   Procedure: IRRIGATION AND DEBRIDEMENT ON RIGHT LEG, ;  Surgeon: CTheodoro Kos DO;  Location: [facility]  Service: Plastics;  Laterality: Right;   I & D EXTREMITY Right 01/20/2015   Procedure: IRRIGATION AND DEBRIDEMENT RIGHT LOWER LEG WOUND, with ACELL to Donor Site, SKIN GRAFT AND VPurdinPlacement;  Surgeon: CLyndee Leo  Sanger, DO;  Location: [facility];  Service: Clinical cytogeneticist;  Laterality: Right;   LUMBAR LAMINECTOMY  02-17-1999   with sinal fusion L3,4,5,&S1   moes     moses surgery on R lle   OOPHORECTOMY  1989   RESECTION DISTAL CLAVICAL     ROTATOR CUFF REPAIR Right 2001   x2   SKIN GRAFT Right 05/29/2018   THYROIDECTOMY     TONSILLECTOMY  1928   TUBAL LIGATION      Allergies  Allergen Reactions   Naproxen     Gi bleed   Nsaids     GI BLEED   Aspirin     GI bleed   Ace Inhibitors     Cough    Caffeine Other (See Comments)    Causes joints to be painful    Outpatient Encounter Medications as of 01/04/2022  Medication Sig   acetaminophen (TYLENOL) 500 MG tablet Take 1,000 mg by mouth in the morning and at bedtime.   Ascorbic Acid (VITAMIN C) 1000 MG tablet Take  1,000 mg by mouth daily.   Carboxymethylcellulose Sodium (THERATEARS) 0.25 % SOLN Apply 2-3 drops to eye in the morning, at noon, in the evening, and at bedtime.   Cholecalciferol (VITAMIN D3) 50 MCG (2000 UT) TABS Take 1 tablet by mouth daily.   DULoxetine (CYMBALTA) 20 MG capsule Take 1 capsule (20 mg total) by mouth daily.   losartan (COZAAR) 50 MG tablet Take 1.5 tablets (75 mg total) by mouth daily.   Multiple Vitamins-Minerals (PRESERVISION AREDS 2+MULTI VIT PO) Take by mouth 2 (two) times daily.   Omega-3 1000 MG CAPS Take one capsule by mouth three times daily.   polyethylene glycol powder (GLYCOLAX/MIRALAX) 17 GM/SCOOP powder Take 17 g by mouth daily as needed.   Pregnenolone 50 MG TABS Take 1 tablet by mouth every morning.   sennosides-docusate sodium (SENOKOT-S) 8.6-50 MG tablet Take 2 tablets by mouth daily.   No facility-administered encounter medications on file as of 01/04/2022.    Review of Systems:  Review of Systems  Constitutional:  Negative for activity change and appetite change.  HENT: Negative.    Respiratory:  Negative for cough and shortness of breath.   Cardiovascular:  Negative for leg swelling.  Gastrointestinal:  Negative for constipation.  Genitourinary: Negative.   Musculoskeletal:  Positive for arthralgias, back pain and gait problem. Negative for myalgias.  Skin: Negative.   Neurological:  Negative for dizziness and weakness.  Psychiatric/Behavioral:  Positive for confusion. Negative for dysphoric mood and sleep disturbance.     Health Maintenance  Topic Date Due   Zoster Vaccines- Shingrix (1 of 2) Never done   COVID-19 Vaccine (5 - Mixed Product risk series) 05/05/2021   INFLUENZA VACCINE  12/27/2021   TETANUS/TDAP  12/12/2022   Pneumonia Vaccine 75+ Years old  Completed   DEXA SCAN  Completed   HPV VACCINES  Aged Out    Physical Exam: Vitals:   01/04/22 1048  BP: 134/88  Pulse: 75  Temp: (!) 97.2 F (36.2 C)  TempSrc: Skin  SpO2: 97%   Weight: 124 lb 12.8 oz (56.6 kg)  Height: 5' (1.524 m)   Body mass index is 24.37 kg/m. Physical Exam Vitals reviewed.  Constitutional:      Appearance: Normal appearance.  HENT:     Head: Normocephalic.     Comments: Mild wax bilateral    Right Ear: Tympanic membrane normal.     Left Ear: Tympanic membrane normal.     Nose:  Nose normal.     Mouth/Throat:     Mouth: Mucous membranes are moist.     Pharynx: Oropharynx is clear.  Eyes:     Pupils: Pupils are equal, round, and reactive to light.  Cardiovascular:     Rate and Rhythm: Normal rate and regular rhythm.     Pulses: Normal pulses.     Heart sounds: Normal heart sounds. No murmur heard. Pulmonary:     Effort: Pulmonary effort is normal.     Breath sounds: Normal breath sounds.  Abdominal:     General: Abdomen is flat. Bowel sounds are normal.     Palpations: Abdomen is soft.     Comments: Easily reducible Small Umbilical Hernia  Musculoskeletal:        General: No swelling.     Cervical back: Neck supple.  Skin:    General: Skin is warm.  Neurological:     General: No focal deficit present.     Mental Status: She is alert and oriented to person, place, and time.  Psychiatric:        Mood and Affect: Mood normal.        Thought Content: Thought content normal.   .mmse  Labs reviewed: Basic Metabolic Panel: Recent Labs    09/15/21 0000 01/02/22 0000  NA 140  --   K 4.1  --   CL 103  --   CO2 25*  --   BUN 21  --   CREATININE 0.6  --   CALCIUM 9.6  --   TSH  --  1.39   Liver Function Tests: Recent Labs    09/15/21 0000  AST 21  ALT 14  ALKPHOS 57  ALBUMIN 4.5   No results for input(s): "LIPASE", "AMYLASE" in the last 8760 hours. No results for input(s): "AMMONIA" in the last 8760 hours. CBC: Recent Labs    09/15/21 0000  WBC 4.9  HGB 12.2  HCT 37  PLT 152   Lipid Panel: No results for input(s): "CHOL", "HDL", "LDLCALC", "TRIG", "CHOLHDL", "LDLDIRECT" in the last 8760 hours. No  results found for: "HGBA1C"  Procedures since last visit: No results found.  Assessment/Plan 1. Senile osteoporosis On [place]  2. Mild cognitive impairment with memory loss MMSE 26/30 Passed her clock  3. Umbilical hernia without obstruction and without gangrene Reassured her that she is not candidate for surgery Can wear Belt as needed  4. Trochanteric bursitis of left hip S/p Steroid injection Can try the knee brace for stability  5. Chronic constipation Miralax and Senna  6. Essential hypertension Continue Cozaar  7. History of partial thyroidectomy TSH normal in 08/23  8. Primary osteoarthritis of left knee Add Extra Tylenol in Afternoon  9. Recurrent falls Needs walker all the time  10. Major depressive disorder with single episode, in full remission ([place]) On Cymbalta 11 Eax Wax Written order for Ear lavage   Labs/tests ordered:  * No order type specified * Next appt:  03/06/2022

## 2022-01-20 ENCOUNTER — Encounter: Payer: Self-pay | Admitting: Orthopedic Surgery

## 2022-01-20 ENCOUNTER — Ambulatory Visit: Payer: Medicare HMO | Admitting: Orthopedic Surgery

## 2022-01-20 DIAGNOSIS — M25552 Pain in left hip: Secondary | ICD-10-CM

## 2022-01-20 NOTE — Progress Notes (Signed)
Office Visit Note   Patient: Renai Lopata           Date of Birth: 1917/11/03           MRN: [id number] Visit Date: 01/20/2022 Requested by: Virgie Dad, MD [address] [place],  [place] [id number] PCP: Virgie Dad, MD  Subjective: Chief Complaint  Patient presents with   Left Leg - Pain    HPI: Inez Catalina is a patient with left hip and knee pain.  Reports some instability and pain in the knee but more in the hip today.  She does ambulate with a walker.  In general ambulating with a walker is not problematic but when she ambulates without a walker she becomes more unsteady.  She did have a greater trochanteric injection last clinic visit which she states did not help much.  Takes Tylenol 3 times a day which does help.  She wants to know if there really any other options.  MRI scan from last year reviewed and she did have a T11-T12 compression fracture.  Had fusion from L2-L5 about 20 years ago.  Denies any groin pain.  First trochanteric bursa injection helped but the second 1 did not and that has been within the last year.  Knee shot has not been helpful.  Denies any numbness and tingling in the leg.  Uses a left knee brace as well.              ROS: All systems reviewed are negative as they relate to the chief complaint within the history of present illness.  Patient denies  fevers or chills.   Assessment & Plan: Visit Diagnoses:  1. Greater trochanteric pain syndrome of left lower extremity     Plan: Impression is left hip and knee pain with no real muscle strength weakness.  I do not think there is anything operative to improve Betti situation at this time.  I think she is having potentially some balance issues but she looks very good from an ambulation standpoint using a walker.  She does have good leg strength and no real pathologic findings other than mild knee arthritis with flexion contracture bilaterally.  Slightly more anterior laxity on the left compared to the  right.  Extensor mechanism intact.  I do not think there is any intervention to be done at this time.  Encouraged her to use the walker is much as possible with ambulation and follow-up with [place] as needed.  Follow-Up Instructions: Return if symptoms worsen or fail to improve.   Orders:  No orders of the defined types were placed in this encounter.  No orders of the defined types were placed in this encounter.     Procedures: No procedures performed   Clinical Data: No additional findings.  Objective: Vital Signs: There were no vitals taken for this visit.  Physical Exam:   Constitutional: Patient appears well-developed HEENT:  Head: Normocephalic Eyes:EOM are normal Neck: Normal range of motion Cardiovascular: Normal rate Pulmonary/chest: Effort normal Neurologic: Patient is alert Skin: Skin is warm Psychiatric: Patient has normal mood and affect   Ortho Exam: Ortho exam demonstrates palpable pedal pulses.  5 degree flexion contracture in both knees.  No effusion in either knee.  Slightly more anterior laxity on the left compared to the right but that was a difficult exam due to guarding and general inability to relax the leg.  No groin pain on the left or right-hand side with internal/external rotation of the leg.  Muscle strength is excellent bilaterally.  No atrophy or wasting.  No nerve root tension signs.  Gait looks normal with the walker being utilized.  Specialty Comments:  No specialty comments available.  Imaging: No results found.   PMFS History: Patient Active Problem List   Diagnosis Date Noted   Major depressive disorder with single episode, in full remission ([place]) 07/28/2020   Trochanteric bursitis of left hip 04/09/2019   Word finding difficulty 04/09/2019   Mild cognitive impairment with memory loss 04/09/2019   Senile osteoporosis 12/27/2018   Chronic constipation 12/11/2014   Depression 06/18/2014   Actinic keratosis 02/10/2014   History of  partial thyroidectomy 12/30/2013   Rash and nonspecific skin eruption 12/30/2013   DNR (do not resuscitate) 12/16/2013   LBBB (left bundle branch block) 07/30/2013   Osteoarthritis of left knee 06/17/2013   Hyperlipemia 09/12/2010   BACK PAIN 03/08/2007   HIATAL HERNIA 12/17/2006   Essential hypertension 03/09/2006   Past Medical History:  Diagnosis Date   Arthritis    "all over"   BACK PAIN 03/08/2007   Cancer ([place])    squamous cell on R leg & face- ?basal cell   DEPRESSIVE DISORDER 12/01/2008   Duodenal ulcer    GI bleed 09/12/2010   Naproxen induced Endoscopy with dr Harlene Ramus    HIATAL HERNIA 12/17/2006   History of hiatal hernia    HYPERLIPIDEMIA 03/09/2006   HYPERTENSION 03/09/2006   KNEE PAIN 05/06/2009   Macular degeneration    Pneumonia    2yr. ago- hosp. pneumonia   Urinary urgency     Family History  Problem Relation Age of Onset   Cancer Mother 856  Cancer Brother     Past Surgical History:  Procedure Laterality Date   ABDOMINAL HYSTERECTOMY  15400  APPLICATION OF A-CELL OF EXTREMITY Right 12/10/2014   Procedure: APPLICATION OF A-CELL OF EXTREMITY;  Surgeon: CTheodoro Kos DO;  Location[facility]  Service: Plastics;  Laterality: Right;   arthroscopic knee Right    x 2   BACK SURGERY  2000   spinal fusion   BILATERAL SALPINGECTOMY Bilateral 1989   Dr. LMarianna Fuss  CARDIAC CATHETERIZATION     EYE SURGERY     cataracts bilateral - removed   HERNIA REPAIR Bilateral    inguinal    I & D EXTREMITY Right 12/10/2014   Procedure: IRRIGATION AND DEBRIDEMENT ON RIGHT LEG, ;  Surgeon: CTheodoro Kos DO;  [facility]  Service: Plastics;  Laterality: Right;   I & D EXTREMITY Right 01/20/2015   Procedure: IRRIGATION AND DEBRIDEMENT RIGHT LOWER LEG WOUND, with ACELL to Donor Site, SKIN GRAFT AND VAC Placement;  Surgeon: CTheodoro Kos DO;  [facility]  Service: Plastics;  Laterality: Right;   LUMBAR LAMINECTOMY  02-17-1999   with sinal fusion L3,4,5,&S1   mo[facility]      moses surgery on R lle   OOPHORECTOMY  1989   RESECTION DISTAL CLAVICAL     ROTATOR CUFF REPAIR Right 2001   x2   SKIN GRAFT Right 05/29/2018   THYROIDECTOMY     TONSILLECTOMY  1928   TUBAL LIGATION     Social History   Occupational History   Not on file  Tobacco Use   Smoking status: Former    Years: 16.00    Types: Cigarettes   Smokeless tobacco: Never   Tobacco comments:    Quit at age 88 Vaping Use   Vaping Use: Never used  Substance and  Sexual Activity   Alcohol use: Yes    Alcohol/week: 7.0 standard drinks of alcohol    Types: 7 Glasses of wine per week    Comment: wine occasionally @ dinner   Drug use: No   Sexual activity: Not on file

## 2022-01-24 DIAGNOSIS — H35423 Microcystoid degeneration of retina, bilateral: Secondary | ICD-10-CM | POA: Diagnosis not present

## 2022-01-24 DIAGNOSIS — H353123 Nonexudative age-related macular degeneration, left eye, advanced atrophic without subfoveal involvement: Secondary | ICD-10-CM | POA: Diagnosis not present

## 2022-01-24 DIAGNOSIS — H353221 Exudative age-related macular degeneration, left eye, with active choroidal neovascularization: Secondary | ICD-10-CM | POA: Diagnosis not present

## 2022-01-24 DIAGNOSIS — H43813 Vitreous degeneration, bilateral: Secondary | ICD-10-CM | POA: Diagnosis not present

## 2022-02-06 ENCOUNTER — Telehealth: Payer: Self-pay

## 2022-02-06 NOTE — Telephone Encounter (Signed)
Incoming call received from [facility] requesting that Dr.Gupta or NP evaluate patient for the use of a power wheelchair and send orders to OT/PT at [facility].  Olean Ree would like a return call once this request has been fulfilled.

## 2022-02-07 NOTE — Telephone Encounter (Signed)
Vanessa Mason notified [facility] that orders have been written.

## 2022-02-07 NOTE — Telephone Encounter (Signed)
I have written order for Therapy to evaluate her for Wheelchair

## 2022-02-14 DIAGNOSIS — M8949 Other hypertrophic osteoarthropathy, multiple sites: Secondary | ICD-10-CM | POA: Diagnosis not present

## 2022-02-14 DIAGNOSIS — R2681 Unsteadiness on feet: Secondary | ICD-10-CM | POA: Diagnosis not present

## 2022-02-14 DIAGNOSIS — M6389 Disorders of muscle in diseases classified elsewhere, multiple sites: Secondary | ICD-10-CM | POA: Diagnosis not present

## 2022-02-14 DIAGNOSIS — R278 Other lack of coordination: Secondary | ICD-10-CM | POA: Diagnosis not present

## 2022-02-14 DIAGNOSIS — R4189 Other symptoms and signs involving cognitive functions and awareness: Secondary | ICD-10-CM | POA: Diagnosis not present

## 2022-02-15 DIAGNOSIS — R2681 Unsteadiness on feet: Secondary | ICD-10-CM | POA: Diagnosis not present

## 2022-02-15 DIAGNOSIS — R278 Other lack of coordination: Secondary | ICD-10-CM | POA: Diagnosis not present

## 2022-02-15 DIAGNOSIS — R4189 Other symptoms and signs involving cognitive functions and awareness: Secondary | ICD-10-CM | POA: Diagnosis not present

## 2022-02-15 DIAGNOSIS — M8949 Other hypertrophic osteoarthropathy, multiple sites: Secondary | ICD-10-CM | POA: Diagnosis not present

## 2022-02-15 DIAGNOSIS — M6389 Disorders of muscle in diseases classified elsewhere, multiple sites: Secondary | ICD-10-CM | POA: Diagnosis not present

## 2022-02-16 DIAGNOSIS — R278 Other lack of coordination: Secondary | ICD-10-CM | POA: Diagnosis not present

## 2022-02-16 DIAGNOSIS — M6389 Disorders of muscle in diseases classified elsewhere, multiple sites: Secondary | ICD-10-CM | POA: Diagnosis not present

## 2022-02-16 DIAGNOSIS — R4189 Other symptoms and signs involving cognitive functions and awareness: Secondary | ICD-10-CM | POA: Diagnosis not present

## 2022-02-16 DIAGNOSIS — R2681 Unsteadiness on feet: Secondary | ICD-10-CM | POA: Diagnosis not present

## 2022-02-16 DIAGNOSIS — M8949 Other hypertrophic osteoarthropathy, multiple sites: Secondary | ICD-10-CM | POA: Diagnosis not present

## 2022-02-17 ENCOUNTER — Telehealth: Payer: Self-pay | Admitting: *Deleted

## 2022-02-17 ENCOUNTER — Emergency Department (HOSPITAL_COMMUNITY): Payer: Medicare HMO

## 2022-02-17 ENCOUNTER — Encounter (HOSPITAL_COMMUNITY): Payer: Self-pay

## 2022-02-17 ENCOUNTER — Emergency Department (HOSPITAL_COMMUNITY)
Admission: EM | Admit: 2022-02-17 | Discharge: 2022-02-17 | Disposition: A | Discharge status: Home / Self Care | Payer: Medicare HMO | Attending: Emergency Medicine | Admitting: Emergency Medicine

## 2022-02-17 DIAGNOSIS — S0990XA Unspecified injury of head, initial encounter: Secondary | ICD-10-CM | POA: Diagnosis not present

## 2022-02-17 DIAGNOSIS — Z743 Need for continuous supervision: Secondary | ICD-10-CM | POA: Diagnosis not present

## 2022-02-17 DIAGNOSIS — W01198A Fall on same level from slipping, tripping and stumbling with subsequent striking against other object, initial encounter: Secondary | ICD-10-CM | POA: Insufficient documentation

## 2022-02-17 DIAGNOSIS — R55 Syncope and collapse: Secondary | ICD-10-CM | POA: Diagnosis not present

## 2022-02-17 DIAGNOSIS — M542 Cervicalgia: Secondary | ICD-10-CM | POA: Diagnosis not present

## 2022-02-17 DIAGNOSIS — R42 Dizziness and giddiness: Secondary | ICD-10-CM | POA: Insufficient documentation

## 2022-02-17 DIAGNOSIS — W19XXXA Unspecified fall, initial encounter: Secondary | ICD-10-CM

## 2022-02-17 DIAGNOSIS — M50321 Other cervical disc degeneration at C4-C5 level: Secondary | ICD-10-CM | POA: Diagnosis not present

## 2022-02-17 LAB — URINALYSIS, ROUTINE W REFLEX MICROSCOPIC
Bilirubin Urine: NEGATIVE
Glucose, UA: NEGATIVE mg/dL
Hgb urine dipstick: NEGATIVE
Ketones, ur: NEGATIVE mg/dL
Leukocytes,Ua: NEGATIVE
Nitrite: NEGATIVE
Protein, ur: NEGATIVE mg/dL
Specific Gravity, Urine: 1.01 (ref 1.005–1.030)
pH: 8 (ref 5.0–8.0)

## 2022-02-17 LAB — BASIC METABOLIC PANEL
Anion gap: 6 (ref 5–15)
BUN: 19 mg/dL (ref 8–23)
CO2: 26 mmol/L (ref 22–32)
Calcium: 8.6 mg/dL — ABNORMAL LOW (ref 8.9–10.3)
Chloride: 109 mmol/L (ref 98–111)
Creatinine, Ser: 0.49 mg/dL (ref 0.44–1.00)
GFR, Estimated: >60 mL/min (ref >60)
Glucose, Bld: 95 mg/dL (ref 70–99)
Potassium: 3.6 mmol/L (ref 3.5–5.1)
Sodium: 141 mmol/L (ref 135–145)

## 2022-02-17 LAB — CBC
HCT: 35.3 % — ABNORMAL LOW (ref 36.0–46.0)
Hemoglobin: 11.4 g/dL — ABNORMAL LOW (ref 12.0–15.0)
MCH: 30.2 pg (ref 26.0–34.0)
MCHC: 32.3 g/dL (ref 30.0–36.0)
MCV: 93.6 fL (ref 80.0–100.0)
Platelets: 153 10*3/uL (ref 150–400)
RBC: 3.77 MIL/uL — ABNORMAL LOW (ref 3.87–5.11)
RDW: 15.1 % (ref 11.5–15.5)
WBC: 5.2 10*3/uL (ref 4.0–10.5)
nRBC: 0 % (ref 0.0–0.2)

## 2022-02-17 LAB — CBG MONITORING, ED: Glucose-Capillary: 91 mg/dL (ref 70–99)

## 2022-02-17 MED ORDER — ACETAMINOPHEN 325 MG PO TABS
650.0000 mg | ORAL_TABLET | Freq: Once | ORAL | Status: AC
  Administered 2022-02-17: 650 mg via ORAL
  Filled 2022-02-17: qty 2

## 2022-02-17 NOTE — ED Notes (Signed)
Attempted to call [facility] to give report and arrange transport. No answer. [facility] compliant VM left with call back number.

## 2022-02-17 NOTE — ED Triage Notes (Addendum)
Pt arrived via EMS, from [place], states she was reaching for something, became dizzy and fell. Denies LOC, remembers entire event. No blood thinners. Aox4.    20 G L AC placed by EMS.   CBG 91 in triage.

## 2022-02-17 NOTE — ED Provider Notes (Addendum)
[facility] DEPT Provider Note   CSN: [id number] Arrival date & time: 02/17/22  0950     History  Chief Complaint  Patient presents with   Dizziness   Fall    Vanessa Mason is a 88 y.o. female.  HPI 88 year old female presents after a fall and striking the back of her head.  She states she was reaching for close in the closet and as she reached up she felt "like she was losing her self" and then fell backwards.  Never felt like she lost consciousness.  Does not specifically feel like she was dizzy or lightheaded.  No chest pain.  She was placed in a c-collar which is bothering her more than anything else.  No other trauma.  Denies any recent illness.  Home Medications Prior to Admission medications   Medication Sig Start Date End Date Taking? Authorizing Provider  acetaminophen (TYLENOL) 500 MG tablet Take 1,000 mg by mouth in the morning and at bedtime.    [provider]  Ascorbic Acid (VITAMIN C) 1000 MG tablet Take 1,000 mg by mouth daily.    [provider]  Carboxymethylcellulose Sodium (THERATEARS) 0.25 % SOLN Apply 2-3 drops to eye in the morning, at noon, in the evening, and at bedtime.    [provider]  Cholecalciferol (VITAMIN D3) 50 MCG (2000 UT) TABS Take 1 tablet by mouth daily. 08/21/18   Reed, Tiffany L, DO  DULoxetine (CYMBALTA) 20 MG capsule Take 1 capsule (20 mg total) by mouth daily. 01/03/21   Virgie Dad, MD  losartan (COZAAR) 50 MG tablet Take 1.5 tablets (75 mg total) by mouth daily. 07/04/21   Royal Hawthorn, NP  Multiple Vitamins-Minerals (PRESERVISION AREDS 2+MULTI VIT PO) Take by mouth 2 (two) times daily.    [provider]  Omega-3 1000 MG CAPS Take one capsule by mouth three times daily.    [provider]  polyethylene glycol powder (GLYCOLAX/MIRALAX) 17 GM/SCOOP powder Take 17 g by mouth daily as needed. 04/26/21   Fargo, Amy E, NP  Pregnenolone 50 MG TABS Take 1  tablet by mouth every morning.    [provider]  sennosides-docusate sodium (SENOKOT-S) 8.6-50 MG tablet Take 2 tablets by mouth daily. 04/26/21   Fargo, Amy E, NP      Allergies    Naproxen, Nsaids, Aspirin, Ace inhibitors, and Caffeine    Review of Systems   Review of Systems  Respiratory:  Negative for shortness of breath.   Cardiovascular:  Negative for chest pain.  Musculoskeletal:  Positive for neck pain.  Neurological:  Positive for headaches. Negative for syncope, weakness and numbness.    Physical Exam Updated Vital Signs BP (!) 174/94   Pulse 83   Temp 98.4 F (36.9 C)   Resp 18   SpO2 97%  Physical Exam Vitals and nursing note reviewed.  Constitutional:      Appearance: She is well-developed.     Interventions: Cervical collar in place.  HENT:     Head: Normocephalic and atraumatic.     Comments: No scalp tenderness or swelling Eyes:     Extraocular Movements: Extraocular movements intact.  Neck:     Comments: No significant posterior neck tenderness. Cardiovascular:     Rate and Rhythm: Normal rate and regular rhythm.     Heart sounds: Normal heart sounds.  Pulmonary:     Effort: Pulmonary effort is normal.     Breath sounds: Normal breath sounds.  Abdominal:     Palpations: Abdomen is soft.     Tenderness: There is no abdominal tenderness.  Skin:    General: Skin is warm and dry.  Neurological:     Mental Status: She is alert and oriented to person, place, and time.     Comments: Awake, alert, oriented to person, place, time. CN 3-12 grossly intact. 5/5 strength in all 4 extremities. Grossly normal sensation. Normal finger to nose.      ED Results / Procedures / Treatments   Labs (all labs ordered are listed, but only abnormal results are displayed) Labs Reviewed  BASIC METABOLIC PANEL - Abnormal; Notable for the following components:      Result Value   Calcium 8.6 (*)    All other components within normal limits  CBC - Abnormal;  Notable for the following components:   RBC 3.77 (*)    Hemoglobin 11.4 (*)    HCT 35.3 (*)    All other components within normal limits  URINALYSIS, ROUTINE W REFLEX MICROSCOPIC - Abnormal; Notable for the following components:   APPearance CLOUDY (*)    All other components within normal limits  CBG MONITORING, ED    EKG EKG Interpretation  Date/Time:  Friday February 17 2022 09:58:49 EDT Ventricular Rate:  84 PR Interval:  178 QRS Duration: 141 QT Interval:  409 QTC Calculation: 484 R Axis:   -21 Text Interpretation: Sinus rhythm Atrial premature complex Left bundle branch block No old tracing to compare Confirmed by Sherwood Gambler [phone]) on 02/17/2022 10:22:55 AM  Radiology CT Head Wo Contrast  Result Date: 02/17/2022 CLINICAL DATA:  Dizziness, fall. EXAM: CT HEAD WITHOUT CONTRAST CT CERVICAL SPINE WITHOUT CONTRAST TECHNIQUE: Multidetector CT imaging of the head and cervical spine was performed following the standard protocol without intravenous contrast. Multiplanar CT image reconstructions of the cervical spine were also generated. RADIATION DOSE REDUCTION: This exam was performed according to the departmental dose-optimization program which includes automated exposure control, adjustment of the mA and/or kV according to patient size and/or use of iterative reconstruction technique. COMPARISON:  None Available. FINDINGS: CT HEAD FINDINGS Brain: Mild diffuse cortical atrophy is noted. Mild chronic ischemic white matter disease is noted. No mass effect or midline shift is noted. Ventricular size is within normal limits. There is no evidence of mass lesion, hemorrhage or acute infarction. Vascular: No hyperdense vessel or unexpected calcification. Skull: Normal. Negative for fracture or focal lesion. Sinuses/Orbits: No acute finding. Other: None. CT CERVICAL SPINE FINDINGS Alignment: Mild grade 1 anterolisthesis of C3-4, C6-7 and C7-T1 is noted secondary to posterior facet joint  hypertrophy. Skull base and vertebrae: No acute fracture. No primary bone lesion or focal pathologic process. Soft tissues and spinal canal: No prevertebral fluid or swelling. No visible canal hematoma. Disc levels: Severe degenerative disc disease is noted at C3-4, C4-5, C5-6 and C6-7. Upper chest: Negative. Other: Degenerative changes are seen involving posterior facet joints bilaterally. IMPRESSION: No acute intracranial abnormality seen. Severe multilevel degenerative disc disease is noted in the cervical spine. No acute abnormality is noted. Electronically Signed   By: Marijo Conception M.D.   On: 02/17/2022 12:04   CT Cervical Spine Wo Contrast  Result Date: 02/17/2022 CLINICAL DATA:  Dizziness, fall. EXAM: CT HEAD WITHOUT CONTRAST CT CERVICAL SPINE WITHOUT CONTRAST TECHNIQUE: Multidetector CT imaging of the head and cervical spine was performed following the standard protocol without intravenous contrast. Multiplanar CT image reconstructions of the cervical spine were also generated. RADIATION DOSE REDUCTION: This  exam was performed according to the departmental dose-optimization program which includes automated exposure control, adjustment of the mA and/or kV according to patient size and/or use of iterative reconstruction technique. COMPARISON:  None Available. FINDINGS: CT HEAD FINDINGS Brain: Mild diffuse cortical atrophy is noted. Mild chronic ischemic white matter disease is noted. No mass effect or midline shift is noted. Ventricular size is within normal limits. There is no evidence of mass lesion, hemorrhage or acute infarction. Vascular: No hyperdense vessel or unexpected calcification. Skull: Normal. Negative for fracture or focal lesion. Sinuses/Orbits: No acute finding. Other: None. CT CERVICAL SPINE FINDINGS Alignment: Mild grade 1 anterolisthesis of C3-4, C6-7 and C7-T1 is noted secondary to posterior facet joint hypertrophy. Skull base and vertebrae: No acute fracture. No primary bone lesion  or focal pathologic process. Soft tissues and spinal canal: No prevertebral fluid or swelling. No visible canal hematoma. Disc levels: Severe degenerative disc disease is noted at C3-4, C4-5, C5-6 and C6-7. Upper chest: Negative. Other: Degenerative changes are seen involving posterior facet joints bilaterally. IMPRESSION: No acute intracranial abnormality seen. Severe multilevel degenerative disc disease is noted in the cervical spine. No acute abnormality is noted. Electronically Signed   By: Marijo Conception M.D.   On: 02/17/2022 12:04    Procedures Procedures    Medications Ordered in ED Medications  acetaminophen (TYLENOL) tablet 650 mg (650 mg Oral Given 02/17/22 1312)    ED Course/ Medical Decision Making/ A&P                           Medical Decision Making Amount and/or Complexity of Data Reviewed Independent Historian:     Details: SNF nurse External Data Reviewed: notes. Labs: ordered.    Details: Mild anemia similar to baseline.  Negative UA.  No significant electrolyte disturbance. Radiology: ordered and independent interpretation performed.    Details: No head bleed or cervical spine fracture ECG/medicine tests: independent interpretation performed.    Details: No acute arrhythmia to cause syncope.  Risk OTC drugs.   Patient presents after a fall.  She states she was transiently dizzy when she was reaching up.  Previous to that she has been feeling well.  She is alert and oriented.  Work-up is unremarkable.  I was able to get her up and have her walk and she has no ataxia or other complaints.  Discussed we get admitted for observation but she declines.  No obvious arrhythmia to suggest why she felt near syncope.  Could have been a TIA but at this point her symptoms have all completely resolved.  Given she was to go home I think this is pretty reasonable and we will discharge her home.  Nurse from facility reports that no one saw the events and no one really knows if she  actually passed out but patient is adamant she did not.        Final Clinical Impression(s) / ED Diagnoses Final diagnoses:  Fall, initial encounter    Rx / DC Orders ED Discharge Orders     None         Sherwood Gambler, MD 02/17/22 1556    Sherwood Gambler, MD 02/17/22 1557

## 2022-02-17 NOTE — Telephone Encounter (Signed)
Tried obtaining Prior Authorization for patient's Prolia through [facility] and received message:  Stating "the drug you have requested should be covered under the medical benefit however the patient does not have medical coverage under this ID."

## 2022-02-17 NOTE — ED Notes (Signed)
Call back received. Transport from [facility] is on the way at this time.

## 2022-02-17 NOTE — ED Notes (Signed)
Pt still unable to urine at this time 

## 2022-02-20 DIAGNOSIS — R4189 Other symptoms and signs involving cognitive functions and awareness: Secondary | ICD-10-CM | POA: Diagnosis not present

## 2022-02-20 DIAGNOSIS — M8949 Other hypertrophic osteoarthropathy, multiple sites: Secondary | ICD-10-CM | POA: Diagnosis not present

## 2022-02-20 DIAGNOSIS — M6389 Disorders of muscle in diseases classified elsewhere, multiple sites: Secondary | ICD-10-CM | POA: Diagnosis not present

## 2022-02-20 DIAGNOSIS — R278 Other lack of coordination: Secondary | ICD-10-CM | POA: Diagnosis not present

## 2022-02-20 DIAGNOSIS — R2681 Unsteadiness on feet: Secondary | ICD-10-CM | POA: Diagnosis not present

## 2022-02-21 DIAGNOSIS — M8949 Other hypertrophic osteoarthropathy, multiple sites: Secondary | ICD-10-CM | POA: Diagnosis not present

## 2022-02-21 DIAGNOSIS — R4189 Other symptoms and signs involving cognitive functions and awareness: Secondary | ICD-10-CM | POA: Diagnosis not present

## 2022-02-21 DIAGNOSIS — R2681 Unsteadiness on feet: Secondary | ICD-10-CM | POA: Diagnosis not present

## 2022-02-21 DIAGNOSIS — M6389 Disorders of muscle in diseases classified elsewhere, multiple sites: Secondary | ICD-10-CM | POA: Diagnosis not present

## 2022-02-21 DIAGNOSIS — R278 Other lack of coordination: Secondary | ICD-10-CM | POA: Diagnosis not present

## 2022-02-22 DIAGNOSIS — R2681 Unsteadiness on feet: Secondary | ICD-10-CM | POA: Diagnosis not present

## 2022-02-22 DIAGNOSIS — R4189 Other symptoms and signs involving cognitive functions and awareness: Secondary | ICD-10-CM | POA: Diagnosis not present

## 2022-02-22 DIAGNOSIS — M6389 Disorders of muscle in diseases classified elsewhere, multiple sites: Secondary | ICD-10-CM | POA: Diagnosis not present

## 2022-02-22 DIAGNOSIS — R278 Other lack of coordination: Secondary | ICD-10-CM | POA: Diagnosis not present

## 2022-02-22 DIAGNOSIS — M8949 Other hypertrophic osteoarthropathy, multiple sites: Secondary | ICD-10-CM | POA: Diagnosis not present

## 2022-02-23 DIAGNOSIS — R278 Other lack of coordination: Secondary | ICD-10-CM | POA: Diagnosis not present

## 2022-02-23 DIAGNOSIS — M8949 Other hypertrophic osteoarthropathy, multiple sites: Secondary | ICD-10-CM | POA: Diagnosis not present

## 2022-02-23 DIAGNOSIS — M6389 Disorders of muscle in diseases classified elsewhere, multiple sites: Secondary | ICD-10-CM | POA: Diagnosis not present

## 2022-02-23 DIAGNOSIS — R4189 Other symptoms and signs involving cognitive functions and awareness: Secondary | ICD-10-CM | POA: Diagnosis not present

## 2022-02-23 DIAGNOSIS — R2681 Unsteadiness on feet: Secondary | ICD-10-CM | POA: Diagnosis not present

## 2022-02-24 DIAGNOSIS — M8949 Other hypertrophic osteoarthropathy, multiple sites: Secondary | ICD-10-CM | POA: Diagnosis not present

## 2022-02-24 DIAGNOSIS — R278 Other lack of coordination: Secondary | ICD-10-CM | POA: Diagnosis not present

## 2022-02-24 DIAGNOSIS — R2681 Unsteadiness on feet: Secondary | ICD-10-CM | POA: Diagnosis not present

## 2022-02-24 DIAGNOSIS — R4189 Other symptoms and signs involving cognitive functions and awareness: Secondary | ICD-10-CM | POA: Diagnosis not present

## 2022-02-24 DIAGNOSIS — M6389 Disorders of muscle in diseases classified elsewhere, multiple sites: Secondary | ICD-10-CM | POA: Diagnosis not present

## 2022-02-24 NOTE — Telephone Encounter (Signed)
[employer] and spoke with Gregary Signs [phone] and initiated Prior Authorization for Prolia  APPROVED 02/24/22-02/25/2023  Auth #: [id number]

## 2022-02-27 DIAGNOSIS — M8949 Other hypertrophic osteoarthropathy, multiple sites: Secondary | ICD-10-CM | POA: Diagnosis not present

## 2022-02-27 DIAGNOSIS — M6389 Disorders of muscle in diseases classified elsewhere, multiple sites: Secondary | ICD-10-CM | POA: Diagnosis not present

## 2022-02-27 DIAGNOSIS — R278 Other lack of coordination: Secondary | ICD-10-CM | POA: Diagnosis not present

## 2022-02-27 DIAGNOSIS — R2681 Unsteadiness on feet: Secondary | ICD-10-CM | POA: Diagnosis not present

## 2022-02-27 DIAGNOSIS — R4189 Other symptoms and signs involving cognitive functions and awareness: Secondary | ICD-10-CM | POA: Diagnosis not present

## 2022-02-28 DIAGNOSIS — R4189 Other symptoms and signs involving cognitive functions and awareness: Secondary | ICD-10-CM | POA: Diagnosis not present

## 2022-02-28 DIAGNOSIS — R2681 Unsteadiness on feet: Secondary | ICD-10-CM | POA: Diagnosis not present

## 2022-02-28 DIAGNOSIS — R278 Other lack of coordination: Secondary | ICD-10-CM | POA: Diagnosis not present

## 2022-02-28 DIAGNOSIS — M6389 Disorders of muscle in diseases classified elsewhere, multiple sites: Secondary | ICD-10-CM | POA: Diagnosis not present

## 2022-02-28 DIAGNOSIS — M8949 Other hypertrophic osteoarthropathy, multiple sites: Secondary | ICD-10-CM | POA: Diagnosis not present

## 2022-03-01 DIAGNOSIS — M8949 Other hypertrophic osteoarthropathy, multiple sites: Secondary | ICD-10-CM | POA: Diagnosis not present

## 2022-03-01 DIAGNOSIS — R2681 Unsteadiness on feet: Secondary | ICD-10-CM | POA: Diagnosis not present

## 2022-03-01 DIAGNOSIS — R4189 Other symptoms and signs involving cognitive functions and awareness: Secondary | ICD-10-CM | POA: Diagnosis not present

## 2022-03-01 DIAGNOSIS — R278 Other lack of coordination: Secondary | ICD-10-CM | POA: Diagnosis not present

## 2022-03-01 DIAGNOSIS — M6389 Disorders of muscle in diseases classified elsewhere, multiple sites: Secondary | ICD-10-CM | POA: Diagnosis not present

## 2022-03-02 DIAGNOSIS — M6389 Disorders of muscle in diseases classified elsewhere, multiple sites: Secondary | ICD-10-CM | POA: Diagnosis not present

## 2022-03-02 DIAGNOSIS — M8949 Other hypertrophic osteoarthropathy, multiple sites: Secondary | ICD-10-CM | POA: Diagnosis not present

## 2022-03-02 DIAGNOSIS — R278 Other lack of coordination: Secondary | ICD-10-CM | POA: Diagnosis not present

## 2022-03-02 DIAGNOSIS — R2681 Unsteadiness on feet: Secondary | ICD-10-CM | POA: Diagnosis not present

## 2022-03-02 DIAGNOSIS — R4189 Other symptoms and signs involving cognitive functions and awareness: Secondary | ICD-10-CM | POA: Diagnosis not present

## 2022-03-03 DIAGNOSIS — M6389 Disorders of muscle in diseases classified elsewhere, multiple sites: Secondary | ICD-10-CM | POA: Diagnosis not present

## 2022-03-03 DIAGNOSIS — M8949 Other hypertrophic osteoarthropathy, multiple sites: Secondary | ICD-10-CM | POA: Diagnosis not present

## 2022-03-03 DIAGNOSIS — R2681 Unsteadiness on feet: Secondary | ICD-10-CM | POA: Diagnosis not present

## 2022-03-03 DIAGNOSIS — R4189 Other symptoms and signs involving cognitive functions and awareness: Secondary | ICD-10-CM | POA: Diagnosis not present

## 2022-03-03 DIAGNOSIS — R278 Other lack of coordination: Secondary | ICD-10-CM | POA: Diagnosis not present

## 2022-03-03 DIAGNOSIS — M150 Primary generalized (osteo)arthritis: Secondary | ICD-10-CM | POA: Diagnosis not present

## 2022-03-03 LAB — BASIC METABOLIC PANEL
BUN: 18 (ref 4–21)
CO2: 27 — AB (ref 13–22)
Chloride: 102 (ref 99–108)
Creatinine: 0.7 (ref 0.5–1.1)
Glucose: 117
Potassium: 4.1 mEq/L (ref 3.5–5.1)
Sodium: 141 (ref 137–147)

## 2022-03-03 LAB — COMPREHENSIVE METABOLIC PANEL
Albumin: 4.6 (ref 3.5–5.0)
Calcium: 9.4 (ref 8.7–10.7)
Globulin: 1.9
eGFR: 76

## 2022-03-03 LAB — HEPATIC FUNCTION PANEL
ALT: 14 U/L (ref 7–35)
AST: 20 (ref 13–35)
Alkaline Phosphatase: 52 (ref 25–125)
Bilirubin, Total: 0.8

## 2022-03-06 ENCOUNTER — Ambulatory Visit: Payer: Medicare HMO

## 2022-03-06 DIAGNOSIS — M6389 Disorders of muscle in diseases classified elsewhere, multiple sites: Secondary | ICD-10-CM | POA: Diagnosis not present

## 2022-03-06 DIAGNOSIS — R4189 Other symptoms and signs involving cognitive functions and awareness: Secondary | ICD-10-CM | POA: Diagnosis not present

## 2022-03-06 DIAGNOSIS — M8949 Other hypertrophic osteoarthropathy, multiple sites: Secondary | ICD-10-CM | POA: Diagnosis not present

## 2022-03-06 DIAGNOSIS — R278 Other lack of coordination: Secondary | ICD-10-CM | POA: Diagnosis not present

## 2022-03-06 DIAGNOSIS — R2681 Unsteadiness on feet: Secondary | ICD-10-CM | POA: Diagnosis not present

## 2022-03-07 DIAGNOSIS — R2681 Unsteadiness on feet: Secondary | ICD-10-CM | POA: Diagnosis not present

## 2022-03-07 DIAGNOSIS — M8949 Other hypertrophic osteoarthropathy, multiple sites: Secondary | ICD-10-CM | POA: Diagnosis not present

## 2022-03-07 DIAGNOSIS — R278 Other lack of coordination: Secondary | ICD-10-CM | POA: Diagnosis not present

## 2022-03-07 DIAGNOSIS — M6389 Disorders of muscle in diseases classified elsewhere, multiple sites: Secondary | ICD-10-CM | POA: Diagnosis not present

## 2022-03-07 DIAGNOSIS — R4189 Other symptoms and signs involving cognitive functions and awareness: Secondary | ICD-10-CM | POA: Diagnosis not present

## 2022-03-08 DIAGNOSIS — M8949 Other hypertrophic osteoarthropathy, multiple sites: Secondary | ICD-10-CM | POA: Diagnosis not present

## 2022-03-08 DIAGNOSIS — R278 Other lack of coordination: Secondary | ICD-10-CM | POA: Diagnosis not present

## 2022-03-08 DIAGNOSIS — M6389 Disorders of muscle in diseases classified elsewhere, multiple sites: Secondary | ICD-10-CM | POA: Diagnosis not present

## 2022-03-08 DIAGNOSIS — R2681 Unsteadiness on feet: Secondary | ICD-10-CM | POA: Diagnosis not present

## 2022-03-08 DIAGNOSIS — R4189 Other symptoms and signs involving cognitive functions and awareness: Secondary | ICD-10-CM | POA: Diagnosis not present

## 2022-03-09 DIAGNOSIS — M6389 Disorders of muscle in diseases classified elsewhere, multiple sites: Secondary | ICD-10-CM | POA: Diagnosis not present

## 2022-03-09 DIAGNOSIS — M8949 Other hypertrophic osteoarthropathy, multiple sites: Secondary | ICD-10-CM | POA: Diagnosis not present

## 2022-03-09 DIAGNOSIS — R278 Other lack of coordination: Secondary | ICD-10-CM | POA: Diagnosis not present

## 2022-03-09 DIAGNOSIS — R4189 Other symptoms and signs involving cognitive functions and awareness: Secondary | ICD-10-CM | POA: Diagnosis not present

## 2022-03-09 DIAGNOSIS — R2681 Unsteadiness on feet: Secondary | ICD-10-CM | POA: Diagnosis not present

## 2022-03-10 DIAGNOSIS — M8949 Other hypertrophic osteoarthropathy, multiple sites: Secondary | ICD-10-CM | POA: Diagnosis not present

## 2022-03-10 DIAGNOSIS — R2681 Unsteadiness on feet: Secondary | ICD-10-CM | POA: Diagnosis not present

## 2022-03-10 DIAGNOSIS — R278 Other lack of coordination: Secondary | ICD-10-CM | POA: Diagnosis not present

## 2022-03-10 DIAGNOSIS — M6389 Disorders of muscle in diseases classified elsewhere, multiple sites: Secondary | ICD-10-CM | POA: Diagnosis not present

## 2022-03-10 DIAGNOSIS — R4189 Other symptoms and signs involving cognitive functions and awareness: Secondary | ICD-10-CM | POA: Diagnosis not present

## 2022-03-13 ENCOUNTER — Telehealth: Payer: Self-pay | Admitting: *Deleted

## 2022-03-13 DIAGNOSIS — M6389 Disorders of muscle in diseases classified elsewhere, multiple sites: Secondary | ICD-10-CM | POA: Diagnosis not present

## 2022-03-13 DIAGNOSIS — R278 Other lack of coordination: Secondary | ICD-10-CM | POA: Diagnosis not present

## 2022-03-13 DIAGNOSIS — R4189 Other symptoms and signs involving cognitive functions and awareness: Secondary | ICD-10-CM | POA: Diagnosis not present

## 2022-03-13 DIAGNOSIS — R2681 Unsteadiness on feet: Secondary | ICD-10-CM | POA: Diagnosis not present

## 2022-03-13 DIAGNOSIS — M8949 Other hypertrophic osteoarthropathy, multiple sites: Secondary | ICD-10-CM | POA: Diagnosis not present

## 2022-03-13 NOTE — Telephone Encounter (Signed)
Patient missed her appointment on 03/06/2022 for Prolia Injection.   Called patient to reschedule and she stated that she was in therapy and would call us back to reschedule her an appointment for a Prolia Injection.

## 2022-03-13 NOTE — Telephone Encounter (Signed)
Vanessa Mason scheduled an appointment for 03/16/2022.

## 2022-03-14 DIAGNOSIS — R278 Other lack of coordination: Secondary | ICD-10-CM | POA: Diagnosis not present

## 2022-03-14 DIAGNOSIS — M6389 Disorders of muscle in diseases classified elsewhere, multiple sites: Secondary | ICD-10-CM | POA: Diagnosis not present

## 2022-03-14 DIAGNOSIS — R2681 Unsteadiness on feet: Secondary | ICD-10-CM | POA: Diagnosis not present

## 2022-03-14 DIAGNOSIS — R4189 Other symptoms and signs involving cognitive functions and awareness: Secondary | ICD-10-CM | POA: Diagnosis not present

## 2022-03-14 DIAGNOSIS — M8949 Other hypertrophic osteoarthropathy, multiple sites: Secondary | ICD-10-CM | POA: Diagnosis not present

## 2022-03-15 DIAGNOSIS — R2681 Unsteadiness on feet: Secondary | ICD-10-CM | POA: Diagnosis not present

## 2022-03-15 DIAGNOSIS — R4189 Other symptoms and signs involving cognitive functions and awareness: Secondary | ICD-10-CM | POA: Diagnosis not present

## 2022-03-15 DIAGNOSIS — M8949 Other hypertrophic osteoarthropathy, multiple sites: Secondary | ICD-10-CM | POA: Diagnosis not present

## 2022-03-15 DIAGNOSIS — R278 Other lack of coordination: Secondary | ICD-10-CM | POA: Diagnosis not present

## 2022-03-15 DIAGNOSIS — M6389 Disorders of muscle in diseases classified elsewhere, multiple sites: Secondary | ICD-10-CM | POA: Diagnosis not present

## 2022-03-16 ENCOUNTER — Ambulatory Visit (INDEPENDENT_AMBULATORY_CARE_PROVIDER_SITE_OTHER): Payer: Medicare HMO | Admitting: *Deleted

## 2022-03-16 DIAGNOSIS — R278 Other lack of coordination: Secondary | ICD-10-CM | POA: Diagnosis not present

## 2022-03-16 DIAGNOSIS — M810 Age-related osteoporosis without current pathological fracture: Secondary | ICD-10-CM

## 2022-03-16 DIAGNOSIS — R4189 Other symptoms and signs involving cognitive functions and awareness: Secondary | ICD-10-CM | POA: Diagnosis not present

## 2022-03-16 DIAGNOSIS — M8949 Other hypertrophic osteoarthropathy, multiple sites: Secondary | ICD-10-CM | POA: Diagnosis not present

## 2022-03-16 DIAGNOSIS — M6389 Disorders of muscle in diseases classified elsewhere, multiple sites: Secondary | ICD-10-CM | POA: Diagnosis not present

## 2022-03-16 DIAGNOSIS — R2681 Unsteadiness on feet: Secondary | ICD-10-CM | POA: Diagnosis not present

## 2022-03-16 MED ORDER — DENOSUMAB 60 MG/ML ~~LOC~~ SOSY
60.0000 mg | PREFILLED_SYRINGE | Freq: Once | SUBCUTANEOUS | Status: AC
  Administered 2022-03-16: 60 mg via SUBCUTANEOUS

## 2022-03-21 DIAGNOSIS — R278 Other lack of coordination: Secondary | ICD-10-CM | POA: Diagnosis not present

## 2022-03-21 DIAGNOSIS — R4189 Other symptoms and signs involving cognitive functions and awareness: Secondary | ICD-10-CM | POA: Diagnosis not present

## 2022-03-21 DIAGNOSIS — M6389 Disorders of muscle in diseases classified elsewhere, multiple sites: Secondary | ICD-10-CM | POA: Diagnosis not present

## 2022-03-21 DIAGNOSIS — R2681 Unsteadiness on feet: Secondary | ICD-10-CM | POA: Diagnosis not present

## 2022-03-21 DIAGNOSIS — M8949 Other hypertrophic osteoarthropathy, multiple sites: Secondary | ICD-10-CM | POA: Diagnosis not present

## 2022-03-22 DIAGNOSIS — M6389 Disorders of muscle in diseases classified elsewhere, multiple sites: Secondary | ICD-10-CM | POA: Diagnosis not present

## 2022-03-22 DIAGNOSIS — R278 Other lack of coordination: Secondary | ICD-10-CM | POA: Diagnosis not present

## 2022-03-22 DIAGNOSIS — R2681 Unsteadiness on feet: Secondary | ICD-10-CM | POA: Diagnosis not present

## 2022-03-22 DIAGNOSIS — R4189 Other symptoms and signs involving cognitive functions and awareness: Secondary | ICD-10-CM | POA: Diagnosis not present

## 2022-03-22 DIAGNOSIS — M8949 Other hypertrophic osteoarthropathy, multiple sites: Secondary | ICD-10-CM | POA: Diagnosis not present

## 2022-03-23 DIAGNOSIS — R4189 Other symptoms and signs involving cognitive functions and awareness: Secondary | ICD-10-CM | POA: Diagnosis not present

## 2022-03-23 DIAGNOSIS — R278 Other lack of coordination: Secondary | ICD-10-CM | POA: Diagnosis not present

## 2022-03-23 DIAGNOSIS — R2681 Unsteadiness on feet: Secondary | ICD-10-CM | POA: Diagnosis not present

## 2022-03-23 DIAGNOSIS — M6389 Disorders of muscle in diseases classified elsewhere, multiple sites: Secondary | ICD-10-CM | POA: Diagnosis not present

## 2022-03-23 DIAGNOSIS — M8949 Other hypertrophic osteoarthropathy, multiple sites: Secondary | ICD-10-CM | POA: Diagnosis not present

## 2022-03-24 DIAGNOSIS — R4189 Other symptoms and signs involving cognitive functions and awareness: Secondary | ICD-10-CM | POA: Diagnosis not present

## 2022-03-24 DIAGNOSIS — R2681 Unsteadiness on feet: Secondary | ICD-10-CM | POA: Diagnosis not present

## 2022-03-24 DIAGNOSIS — M6389 Disorders of muscle in diseases classified elsewhere, multiple sites: Secondary | ICD-10-CM | POA: Diagnosis not present

## 2022-03-24 DIAGNOSIS — R278 Other lack of coordination: Secondary | ICD-10-CM | POA: Diagnosis not present

## 2022-03-24 DIAGNOSIS — M8949 Other hypertrophic osteoarthropathy, multiple sites: Secondary | ICD-10-CM | POA: Diagnosis not present

## 2022-03-27 DIAGNOSIS — R278 Other lack of coordination: Secondary | ICD-10-CM | POA: Diagnosis not present

## 2022-03-27 DIAGNOSIS — R4189 Other symptoms and signs involving cognitive functions and awareness: Secondary | ICD-10-CM | POA: Diagnosis not present

## 2022-03-27 DIAGNOSIS — M6389 Disorders of muscle in diseases classified elsewhere, multiple sites: Secondary | ICD-10-CM | POA: Diagnosis not present

## 2022-03-27 DIAGNOSIS — M8949 Other hypertrophic osteoarthropathy, multiple sites: Secondary | ICD-10-CM | POA: Diagnosis not present

## 2022-03-27 DIAGNOSIS — R2681 Unsteadiness on feet: Secondary | ICD-10-CM | POA: Diagnosis not present

## 2022-04-05 DIAGNOSIS — M6389 Disorders of muscle in diseases classified elsewhere, multiple sites: Secondary | ICD-10-CM | POA: Diagnosis not present

## 2022-04-05 DIAGNOSIS — R4189 Other symptoms and signs involving cognitive functions and awareness: Secondary | ICD-10-CM | POA: Diagnosis not present

## 2022-04-05 DIAGNOSIS — M8949 Other hypertrophic osteoarthropathy, multiple sites: Secondary | ICD-10-CM | POA: Diagnosis not present

## 2022-04-05 DIAGNOSIS — R2681 Unsteadiness on feet: Secondary | ICD-10-CM | POA: Diagnosis not present

## 2022-04-05 DIAGNOSIS — R278 Other lack of coordination: Secondary | ICD-10-CM | POA: Diagnosis not present

## 2022-04-12 DIAGNOSIS — M8949 Other hypertrophic osteoarthropathy, multiple sites: Secondary | ICD-10-CM | POA: Diagnosis not present

## 2022-04-12 DIAGNOSIS — R278 Other lack of coordination: Secondary | ICD-10-CM | POA: Diagnosis not present

## 2022-04-12 DIAGNOSIS — R2681 Unsteadiness on feet: Secondary | ICD-10-CM | POA: Diagnosis not present

## 2022-04-12 DIAGNOSIS — R4189 Other symptoms and signs involving cognitive functions and awareness: Secondary | ICD-10-CM | POA: Diagnosis not present

## 2022-04-12 DIAGNOSIS — M6389 Disorders of muscle in diseases classified elsewhere, multiple sites: Secondary | ICD-10-CM | POA: Diagnosis not present

## 2022-04-18 LAB — COMPREHENSIVE METABOLIC PANEL
Calcium: 10.2 (ref 8.7–10.7)
eGFR: >90

## 2022-04-18 LAB — CBC: RBC: 4.56 (ref 3.87–5.11)

## 2022-04-18 LAB — BASIC METABOLIC PANEL
BUN: 14 (ref 4–21)
CO2: 25 — AB (ref 13–22)
Chloride: 103 (ref 99–108)
Creatinine: 0.6 (ref 0.5–1.1)
Glucose: 117
Potassium: 4.4 mEq/L (ref 3.5–5.1)
Sodium: 143 (ref 137–147)

## 2022-04-18 LAB — CBC AND DIFFERENTIAL
HCT: 41 (ref 36–46)
Hemoglobin: 12.9 (ref 12.0–16.0)
Platelets: 291 10*3/uL (ref 150–400)
WBC: 6.1

## 2022-04-19 DIAGNOSIS — M8949 Other hypertrophic osteoarthropathy, multiple sites: Secondary | ICD-10-CM | POA: Diagnosis not present

## 2022-04-19 DIAGNOSIS — R2681 Unsteadiness on feet: Secondary | ICD-10-CM | POA: Diagnosis not present

## 2022-04-19 DIAGNOSIS — R4189 Other symptoms and signs involving cognitive functions and awareness: Secondary | ICD-10-CM | POA: Diagnosis not present

## 2022-04-19 DIAGNOSIS — R278 Other lack of coordination: Secondary | ICD-10-CM | POA: Diagnosis not present

## 2022-04-19 DIAGNOSIS — M6389 Disorders of muscle in diseases classified elsewhere, multiple sites: Secondary | ICD-10-CM | POA: Diagnosis not present

## 2022-04-25 DIAGNOSIS — H35033 Hypertensive retinopathy, bilateral: Secondary | ICD-10-CM | POA: Diagnosis not present

## 2022-04-25 DIAGNOSIS — H353113 Nonexudative age-related macular degeneration, right eye, advanced atrophic without subfoveal involvement: Secondary | ICD-10-CM | POA: Diagnosis not present

## 2022-04-25 DIAGNOSIS — H353221 Exudative age-related macular degeneration, left eye, with active choroidal neovascularization: Secondary | ICD-10-CM | POA: Diagnosis not present

## 2022-04-25 DIAGNOSIS — H43813 Vitreous degeneration, bilateral: Secondary | ICD-10-CM | POA: Diagnosis not present

## 2022-04-25 DIAGNOSIS — H35423 Microcystoid degeneration of retina, bilateral: Secondary | ICD-10-CM | POA: Diagnosis not present

## 2022-04-26 DIAGNOSIS — R2681 Unsteadiness on feet: Secondary | ICD-10-CM | POA: Diagnosis not present

## 2022-04-26 DIAGNOSIS — M6389 Disorders of muscle in diseases classified elsewhere, multiple sites: Secondary | ICD-10-CM | POA: Diagnosis not present

## 2022-04-26 DIAGNOSIS — R4189 Other symptoms and signs involving cognitive functions and awareness: Secondary | ICD-10-CM | POA: Diagnosis not present

## 2022-04-26 DIAGNOSIS — R278 Other lack of coordination: Secondary | ICD-10-CM | POA: Diagnosis not present

## 2022-04-26 DIAGNOSIS — M8949 Other hypertrophic osteoarthropathy, multiple sites: Secondary | ICD-10-CM | POA: Diagnosis not present

## 2022-05-03 DIAGNOSIS — M8949 Other hypertrophic osteoarthropathy, multiple sites: Secondary | ICD-10-CM | POA: Diagnosis not present

## 2022-05-03 DIAGNOSIS — R278 Other lack of coordination: Secondary | ICD-10-CM | POA: Diagnosis not present

## 2022-05-03 DIAGNOSIS — R2681 Unsteadiness on feet: Secondary | ICD-10-CM | POA: Diagnosis not present

## 2022-05-03 DIAGNOSIS — R4189 Other symptoms and signs involving cognitive functions and awareness: Secondary | ICD-10-CM | POA: Diagnosis not present

## 2022-05-03 DIAGNOSIS — M6389 Disorders of muscle in diseases classified elsewhere, multiple sites: Secondary | ICD-10-CM | POA: Diagnosis not present

## 2022-05-08 ENCOUNTER — Non-Acute Institutional Stay: Payer: Medicare HMO | Admitting: Adult Health

## 2022-05-08 ENCOUNTER — Encounter: Payer: Self-pay | Admitting: Adult Health

## 2022-05-08 VITALS — BP 158/84 | HR 80 | Temp 97.5°F | Resp 18 | Ht 60.0 in | Wt 124.0 lb

## 2022-05-08 DIAGNOSIS — I10 Essential (primary) hypertension: Secondary | ICD-10-CM

## 2022-05-08 DIAGNOSIS — G3184 Mild cognitive impairment, so stated: Secondary | ICD-10-CM

## 2022-05-08 DIAGNOSIS — L989 Disorder of the skin and subcutaneous tissue, unspecified: Secondary | ICD-10-CM

## 2022-05-08 DIAGNOSIS — M7062 Trochanteric bursitis, left hip: Secondary | ICD-10-CM

## 2022-05-08 DIAGNOSIS — M810 Age-related osteoporosis without current pathological fracture: Secondary | ICD-10-CM

## 2022-05-08 DIAGNOSIS — H6121 Impacted cerumen, right ear: Secondary | ICD-10-CM | POA: Diagnosis not present

## 2022-05-08 DIAGNOSIS — R21 Rash and other nonspecific skin eruption: Secondary | ICD-10-CM | POA: Diagnosis not present

## 2022-05-08 DIAGNOSIS — M1712 Unilateral primary osteoarthritis, left knee: Secondary | ICD-10-CM | POA: Diagnosis not present

## 2022-05-08 MED ORDER — TRIAMCINOLONE ACETONIDE 0.1 % EX CREA: 1.0000 | TOPICAL_CREAM | Freq: Two times a day (BID) | CUTANEOUS | 0 refills | Status: AC

## 2022-05-08 NOTE — Progress Notes (Signed)
Location:  [facility]  POS: Clinic  Provider: Royal Hawthorn, ANP  Code Status: DNR Goals of Care:     05/08/2022    1:50 PM  Advanced Directives  Does Patient Have a Medical Advance Directive? Yes  Type of Advance Directive Living will;Out of facility DNR (pink MOST or yellow form);Healthcare Power of Attorney  Does patient want to make changes to medical advance directive? No - Patient declined  Copy of Deputy in Chart? Yes - validated most recent copy scanned in chart (See row information)     Chief Complaint  Patient presents with  . Medical Management of Chronic Issues    4 month follow up  . Immunizations    Discussed the need for Shingles vaccine  . Acute Visit    Wart on right thumb and a lesion on back to the left of the center. 3 nose bleeds    HPI: Patient is a 88 y.o. female seen today for medical management of chronic diseases.   She is 88 years old.  Sees ophthalmology due to macular degeneration receives Avastin injections.   She has a rash and reddened are to her back with some itching  Skin lesion that was raised and red to right hand now flatter and scaly  Hearing loss with hearing aides in both ears which is unchanged.   Osteoporosis T score -4.5 04/15/19, worse from 2018 -4 June of 2021 was on evenity. Started Prolia 4/23  HTN: SBP 140-150s  Left hip bursitis and left knee OA seen by ortho with injection in July. Pain is tolerable. No longer seeing ortho. Says tylenol controls pain.   Has frequent falls. Currently seeing OT for mobility and fall reduction   MCI: MMSE 26/30 02/15/22 passed clock word finding and executive functioning issues.   Chronic umbilical hernia with no pain or fever. Bothers her that its there.   Wt Readings from Last 3 Encounters:  01/04/22 124 lb 12.8 oz (56.6 kg)  11/14/21 126 lb (57.2 kg)  10/03/21 127 lb 6.4 oz (57.8 kg)    Past Medical History:  Diagnosis Date  . Arthritis    "all  over"  . BACK PAIN 03/08/2007  . 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 Cutaneous abscess of right lower limb   . DEPRESSIVE DISORDER 12/01/2008  . Disruption of external operation (surgical) wound, not elsewhere classified, initial encounter   . Duodenal ulcer   . Essential (primary) hypertension   . GI bleed 09/12/2010   Naproxen induced Endoscopy with dr Harlene Ramus   . HIATAL HERNIA 12/17/2006  . History of hiatal hernia   . HYPERLIPIDEMIA 03/09/2006  . Hyperlipidemia, unspecified   . HYPERTENSION 03/09/2006  . KNEE PAIN 05/06/2009  . Macular degeneration   . Major depressive disorder, single episode, unspecified   . Pneumonia    10yr. ago- hosp. pneumonia  . Urinary urgency     Past Surgical History:  Procedure Laterality Date  . ABDOMINAL HYSTERECTOMY  1975  . APPLICATION OF A-CELL OF EXTREMITY Right 12/10/2014   Procedure: APPLICATION OF A-CELL OF EXTREMITY;  Surgeon: CTheodoro Kos DO;  Location: [facility]  Service: Plastics;  Laterality: Right;  . arthroscopic knee Right    x 2  . BACK SURGERY  2000   spinal fusion  . BILATERAL SALPINGECTOMY Bilateral 1989   Dr. LMarianna Fuss . CARDIAC CATHETERIZATION    . EYE SURGERY     cataracts bilateral -  removed  . HERNIA REPAIR Bilateral    inguinal   . I & D EXTREMITY Right 12/10/2014   Procedure: IRRIGATION AND DEBRIDEMENT ON RIGHT LEG, ;  Surgeon: Theodoro Kos, DO;  Location: [place];  Service: Plastics;  Laterality: Right;  . I & D EXTREMITY Right 01/20/2015   Procedure: IRRIGATION AND DEBRIDEMENT RIGHT LOWER LEG WOUND, with ACELL to Donor Site, SKIN GRAFT AND VAC Placement;  Surgeon: Theodoro Kos, DO;  Location: [place];  Service: Plastics;  Laterality: Right;  . LUMBAR LAMINECTOMY  02-17-1999   with sinal fusion L3,4,5,&S1  . moes     moses surgery on R lle  . OOPHORECTOMY  1989  . RESECTION DISTAL CLAVICAL    . ROTATOR CUFF REPAIR Right 2001   x2  . SKIN GRAFT Right 05/29/2018  . THYROIDECTOMY    .  TONSILLECTOMY  1928  . TUBAL LIGATION      Allergies  Allergen Reactions  . Naproxen     Gi bleed  . Nsaids     GI BLEED  . Aspirin     GI bleed  . Ace Inhibitors     Cough   . Caffeine Other (See Comments)    Causes joints to be painful    Outpatient Encounter Medications as of 05/08/2022  Medication Sig  . acetaminophen (TYLENOL) 325 MG tablet Take 325 mg by mouth once. Take 650 mg , oral , once a morning  . acetaminophen (TYLENOL) 500 MG tablet Take 1,000 mg by mouth in the morning and at bedtime.  . Ascorbic Acid (VITAMIN C) 1000 MG tablet Take 1,000 mg by mouth daily.  . Carboxymethylcellulose Sodium (THERATEARS) 0.25 % SOLN Apply 2-3 drops to eye in the morning, at noon, in the evening, and at bedtime.  . Cholecalciferol (VITAMIN D3) 50 MCG (2000 UT) TABS Take 1 tablet by mouth daily.  . DULoxetine (CYMBALTA) 20 MG capsule Take 1 capsule (20 mg total) by mouth daily.  Marland Kitchen losartan (COZAAR) 50 MG tablet Take 1.5 tablets (75 mg total) by mouth daily.  . Multiple Vitamins-Minerals (PRESERVISION AREDS 2+MULTI VIT PO) Take by mouth 2 (two) times daily.  . Omega-3 1000 MG CAPS Take one capsule by mouth three times daily.  . polyethylene glycol powder (GLYCOLAX/MIRALAX) 17 GM/SCOOP powder Take 17 g by mouth daily as needed.  . Pregnenolone 50 MG TABS Take 1 tablet by mouth every morning.  . sennosides-docusate sodium (SENOKOT-S) 8.6-50 MG tablet Take 2 tablets by mouth daily.   No facility-administered encounter medications on file as of 05/08/2022.    Review of Systems:  Review of Systems  Constitutional:  Negative for activity change, appetite change, chills, diaphoresis, fatigue, fever and unexpected weight change.  HENT:  Positive for hearing loss. Negative for congestion, rhinorrhea and trouble swallowing.   Eyes:  Negative for pain, discharge, redness and itching.       Vision worsening over time.   Respiratory:  Negative for cough, shortness of breath and wheezing.    Cardiovascular:  Negative for chest pain, palpitations and leg swelling.  Gastrointestinal:  Negative for abdominal distention, abdominal pain, constipation and diarrhea.  Genitourinary:  Negative for difficulty urinating and dysuria.  Musculoskeletal:  Positive for arthralgias and gait problem. Negative for back pain, joint swelling and myalgias.       Chronic left hip and knee pain  Neurological:  Negative for dizziness, tremors, seizures, syncope, facial asymmetry, speech difficulty, weakness, light-headedness, numbness and headaches.  Psychiatric/Behavioral:  Negative for agitation, behavioral  problems and confusion.        Memory loss    Health Maintenance  Topic Date Due  . Zoster Vaccines- Shingrix (1 of 2) Never done  . COVID-19 Vaccine (7 - 2023-24 season) 05/29/2022  . Medicare Annual Wellness (AWV)  10/12/2022  . DTaP/Tdap/Td (3 - Td or Tdap) 12/12/2022  . Pneumonia Vaccine 19+ Years old  Completed  . INFLUENZA VACCINE  Completed  . DEXA SCAN  Completed  . HPV VACCINES  Aged Out    Physical Exam: There were no vitals filed for this visit.  There is no height or weight on file to calculate BMI. Physical Exam Vitals and nursing note reviewed.  Constitutional:      General: She is not in acute distress.    Appearance: She is not diaphoretic.  HENT:     Head: Normocephalic and atraumatic.     Mouth/Throat:     Mouth: Mucous membranes are moist.     Pharynx: Oropharynx is clear.  Eyes:     Conjunctiva/sclera: Conjunctivae normal.     Pupils: Pupils are equal, round, and reactive to light.  Neck:     Vascular: No JVD.  Cardiovascular:     Rate and Rhythm: Normal rate and regular rhythm.     Heart sounds: No murmur heard. Pulmonary:     Effort: Pulmonary effort is normal. No respiratory distress.     Breath sounds: Normal breath sounds. No wheezing.  Abdominal:     General: Bowel sounds are normal. There is no distension.     Palpations: Abdomen is soft.      Tenderness: There is no abdominal tenderness.  Musculoskeletal:     Right lower leg: No edema.     Left lower leg: No edema.  Skin:    General: Skin is warm and dry.  Neurological:     Mental Status: She is alert and oriented to person, place, and time.  Psychiatric:        Mood and Affect: Mood normal.    Labs reviewed: Basic Metabolic Panel: Recent Labs    09/15/21 0000 01/02/22 0000 02/17/22 1010 03/03/22 0000  NA 140  --  141 141  K 4.1  --  3.6 4.1  CL 103  --  109 102  CO2 25*  --  26 27*  GLUCOSE  --   --  95  --   BUN 21  --  19 18  CREATININE 0.6  --  0.49 0.7  CALCIUM 9.6  --  8.6* 9.4  TSH  --  1.39  --   --    Liver Function Tests: Recent Labs    09/15/21 0000 03/03/22 0000  AST 21 20  ALT 14 14  ALKPHOS 57 52  ALBUMIN 4.5 4.6   No results for input(s): "LIPASE", "AMYLASE" in the last 8760 hours. No results for input(s): "AMMONIA" in the last 8760 hours. CBC: Recent Labs    09/15/21 0000 02/17/22 1010  WBC 4.9 5.2  HGB 12.2 11.4*  HCT 37 35.3*  MCV  --  93.6  PLT 152 153   Lipid Panel: No results for input(s): "CHOL", "HDL", "LDLCALC", "TRIG", "CHOLHDL", "LDLDIRECT" in the last 8760 hours. No results found for: "HGBA1C"  Procedures since last visit: No results found.  Assessment/Plan  Rash to back Triamcinolone bid x 7 days  Cerumen impaction right ear Initiate cerumen impaction protocol   Left knee pain No longer seeing ortho Continue tylenol   Left hip bursitis  See above.   Memory loss MMSE scores the same Has worsening word finding and executive function issues but remains appropriate for AL  HTN Controlled and at goal for her age  Macular degeneration Followed by ophthalmology   Skin lesion To right hand Possible SK vs AK Monitor for now, can f/u with derm if worsening.    Labs/tests ordered:  * No order type specified *CBC BMP prior to apt Next appt: August 08 2022 120 pm  Total time 76mn:  time greater than  50% of total time spent doing pt counseling and coordination of care

## 2022-05-10 DIAGNOSIS — R278 Other lack of coordination: Secondary | ICD-10-CM | POA: Diagnosis not present

## 2022-05-10 DIAGNOSIS — R4189 Other symptoms and signs involving cognitive functions and awareness: Secondary | ICD-10-CM | POA: Diagnosis not present

## 2022-05-10 DIAGNOSIS — M8949 Other hypertrophic osteoarthropathy, multiple sites: Secondary | ICD-10-CM | POA: Diagnosis not present

## 2022-05-10 DIAGNOSIS — R2681 Unsteadiness on feet: Secondary | ICD-10-CM | POA: Diagnosis not present

## 2022-05-10 DIAGNOSIS — M6389 Disorders of muscle in diseases classified elsewhere, multiple sites: Secondary | ICD-10-CM | POA: Diagnosis not present

## 2022-05-18 ENCOUNTER — Emergency Department (HOSPITAL_COMMUNITY)
Admission: EM | Admit: 2022-05-18 | Discharge: 2022-05-18 | Disposition: A | Discharge status: Home / Self Care | Payer: Medicare HMO | Attending: Emergency Medicine | Admitting: Emergency Medicine

## 2022-05-18 ENCOUNTER — Emergency Department (HOSPITAL_COMMUNITY): Payer: Medicare HMO

## 2022-05-18 DIAGNOSIS — M25552 Pain in left hip: Secondary | ICD-10-CM | POA: Diagnosis not present

## 2022-05-18 DIAGNOSIS — S0990XA Unspecified injury of head, initial encounter: Secondary | ICD-10-CM | POA: Diagnosis not present

## 2022-05-18 DIAGNOSIS — M25551 Pain in right hip: Secondary | ICD-10-CM | POA: Insufficient documentation

## 2022-05-18 DIAGNOSIS — W19XXXA Unspecified fall, initial encounter: Secondary | ICD-10-CM | POA: Diagnosis not present

## 2022-05-18 DIAGNOSIS — Z7401 Bed confinement status: Secondary | ICD-10-CM | POA: Diagnosis not present

## 2022-05-18 DIAGNOSIS — W0110XA Fall on same level from slipping, tripping and stumbling with subsequent striking against unspecified object, initial encounter: Secondary | ICD-10-CM | POA: Diagnosis not present

## 2022-05-18 DIAGNOSIS — Z743 Need for continuous supervision: Secondary | ICD-10-CM | POA: Diagnosis not present

## 2022-05-18 DIAGNOSIS — I10 Essential (primary) hypertension: Secondary | ICD-10-CM | POA: Insufficient documentation

## 2022-05-18 DIAGNOSIS — M549 Dorsalgia, unspecified: Secondary | ICD-10-CM | POA: Diagnosis not present

## 2022-05-18 DIAGNOSIS — R4182 Altered mental status, unspecified: Secondary | ICD-10-CM | POA: Diagnosis not present

## 2022-05-18 MED ORDER — ACETAMINOPHEN 325 MG PO TABS
650.0000 mg | ORAL_TABLET | Freq: Once | ORAL | Status: AC
  Administered 2022-05-18: 650 mg via ORAL
  Filled 2022-05-18: qty 2

## 2022-05-18 NOTE — ED Notes (Signed)
Called for vitals, no answer 

## 2022-05-18 NOTE — ED Provider Triage Note (Signed)
Emergency Medicine Provider Triage Evaluation Note  Vanessa Mason , a 88 y.o. female  was evaluated in triage.  Pt complains of right hip pain after an unwitnessed fall.  Patient coming from home.  She is not anticoagulated.  She denies hitting her head.  Review of Systems  Positive:  Negative: See above   Physical Exam  BP (!) 141/81 (BP Location: Right Arm)   Pulse 79   Temp 98.9 F (37.2 C) (Oral)   Resp 18   SpO2 93%  Gen:   Awake, no distress   Resp:  Normal effort  MSK:   Moves extremities without difficulty  Other:    Medical Decision Making  Medically screening exam initiated at 11:59 AM.  Appropriate orders placed.  Vanessa Mason was informed that the remainder of the evaluation will be completed by another provider, this initial triage assessment does not replace that evaluation, and the importance of remaining in the ED until their evaluation is complete.     Vanessa Mason, [place] 05/18/22 1201

## 2022-05-18 NOTE — ED Triage Notes (Signed)
Patient BIB [facility] from [facility] after an unwitnessed fall earlier today, complains of right hip pain.

## 2022-05-18 NOTE — Discharge Instructions (Addendum)
Discharge Rehab is Recommended   You were seen in the emergency department after your fall.  Your workup showed no signs of any broken bones or bleeding in your brain.  Due to your frequent falls and your unsteadiness with your walker, you are recommended rehab and you were able to get into the rehab facility at your assisted living.  You can continue to take Tylenol as needed for pain as well as use ice and heat.  You should follow-up with your primary doctor in the next few days to have your symptoms rechecked.  You should return to the emergency department if you are having significantly worsening pain and are unable to walk, you have new numbness or weakness, you pass out or if you have any other new or concerning symptoms.

## 2022-05-18 NOTE — ED Notes (Signed)
Pt unable to ambulate or stand independently, two times assist to standing and pivot motion only, no steps

## 2022-05-18 NOTE — Progress Notes (Signed)
Pt can return to [facility] with a note from dr recommending rehab.  Pt will receive rehab at facility.  Pt can return to [facility] tonight.  Ernst Cumpston Tarpley-Carter, MSW, LCSW-A Pronouns:  She/Her/Hers [facility] HealthTransitions of Care Clinical Social Worker Direct Number:  [phone] [email or web address]

## 2022-05-18 NOTE — ED Provider Notes (Signed)
[facility] EMERGENCY DEPARTMENT Provider Note   CSN: [id number] Arrival date & time: 05/18/22  1130     History  Chief Complaint  Patient presents with   [facility] De[place]ley is a 88 y.o. female.  Patient is 88 year old female with a past medical history of hypertension, hiatal hernia and depression presenting to the emergency department after a fall.  Patient states that she has chronic back pain and that her back gave out on her today causing her to fall.  She states that she hit the back of her head and landed on her right hip.  She denies any loss of consciousness.  She states that she has not been able to get up or ambulate since the fall.  She denies any nausea, vomiting, new numbness or weakness.  She states she has not had anything yet for pain.  The history is provided by the patient and a relative.  Fall       Home Medications Prior to Admission medications   Medication Sig Start Date End Date Taking? Authorizing Provider  acetaminophen (TYLENOL) 325 MG tablet Take 325 mg by mouth once. Take 650 mg , oral , once a morning    [provider]  acetaminophen (TYLENOL) 500 MG tablet Take 1,000 mg by mouth in the morning and at bedtime.    [provider]  Ascorbic Acid (VITAMIN C) 1000 MG tablet Take 1,000 mg by mouth daily.    [provider]  Carboxymethylcellulose Sodium (THERATEARS) 0.25 % SOLN Apply 2-3 drops to eye in the morning, at noon, in the evening, and at bedtime.    [provider]  Cholecalciferol (VITAMIN D3) 50 MCG (2000 UT) TABS Take 1 tablet by mouth daily. 08/21/18   Reed, Tiffany L, DO  DULoxetine (CYMBALTA) 20 MG capsule Take 1 capsule (20 mg total) by mouth daily. 01/03/21   Virgie Dad, MD  losartan (COZAAR) 50 MG tablet Take 1.5 tablets (75 mg total) by mouth daily. 07/04/21   Royal Hawthorn, NP  Multiple Vitamins-Minerals (PRESERVISION AREDS 2+MULTI VIT PO) Take by mouth 2 (two)  times daily.    [provider]  Omega-3 1000 MG CAPS Take one capsule by mouth three times daily.    [provider]  polyethylene glycol powder (GLYCOLAX/MIRALAX) 17 GM/SCOOP powder Take 17 g by mouth daily as needed. 04/26/21   Fargo, Amy E, NP  Pregnenolone 50 MG TABS Take 1 tablet by mouth every morning.    [provider]  sennosides-docusate sodium (SENOKOT-S) 8.6-50 MG tablet Take 2 tablets by mouth daily. 04/26/21   Fargo, Amy E, NP      Allergies    Naproxen, Nsaids, Aspirin, Ace inhibitors, and Caffeine    Review of Systems   Review of Systems  Physical Exam Updated Vital Signs BP (!) 175/99   Pulse 87   Temp 98.7 F (37.1 C) (Oral)   Resp 16   SpO2 96%  Physical Exam Vitals and nursing note reviewed.  Constitutional:      General: She is not in acute distress.    Appearance: Normal appearance.  HENT:     Head: Normocephalic and atraumatic.     Nose: Nose normal.     Mouth/Throat:     Mouth: Mucous membranes are moist.     Pharynx: Oropharynx is clear.  Eyes:     Extraocular Movements: Extraocular movements intact.     Conjunctiva/sclera: Conjunctivae normal.  Pupils: Pupils are equal, round, and reactive to light.  Neck:     Comments:  No midline neck tenderness Cardiovascular:     Rate and Rhythm: Normal rate and regular rhythm.     Heart sounds: Normal heart sounds.  Pulmonary:     Effort: Pulmonary effort is normal.     Breath sounds: Normal breath sounds.  Abdominal:     General: Abdomen is flat.     Palpations: Abdomen is soft.     Tenderness: There is no abdominal tenderness.  Musculoskeletal:        General: No swelling, tenderness or deformity. Normal range of motion.     Cervical back: Normal range of motion and neck supple.     Comments: Full hip ROM in internal/external rotation bilaterally Bilateral knee flexion/extension intact, knee brace in place on L knee  Skin:    General: Skin is warm and dry.      Findings: No bruising.  Neurological:     General: No focal deficit present.     Mental Status: She is alert and oriented to person, place, and time.     Sensory: No sensory deficit.     Motor: No weakness.  Psychiatric:        Mood and Affect: Mood normal.        Behavior: Behavior normal.     ED Results / Procedures / Treatments   Labs (all labs ordered are listed, but only abnormal results are displayed) Labs Reviewed - No data to display  EKG None  Radiology CT Head Wo Contrast  Result Date: 05/18/2022 CLINICAL DATA:  Trauma EXAM: CT HEAD WITHOUT CONTRAST TECHNIQUE: Contiguous axial images were obtained from the base of the skull through the vertex without intravenous contrast. RADIATION DOSE REDUCTION: This exam was performed according to the departmental dose-optimization program which includes automated exposure control, adjustment of the mA and/or kV according to patient size and/or use of iterative reconstruction technique. COMPARISON:  None Available. FINDINGS: Brain: No evidence of acute infarction, hemorrhage, hydrocephalus, extra-axial collection or mass lesion/mass effect. Vascular: No hyperdense vessel or unexpected calcification. Skull: Normal. Negative for fracture or focal lesion. Sinuses/Orbits: Bilateral lens replacement. Paranasal sinuses are clear. Other: None. IMPRESSION: No CT evidence of intracranial injury. No acute intracranial abnormality. Electronically Signed   By: Marin Roberts M.D.   On: 05/18/2022 13:14   DG Hip Unilat  With Pelvis 2-3 Views Right  Result Date: 05/18/2022 CLINICAL DATA:  Right hip pain after fall. EXAM: DG HIP (WITH OR WITHOUT PELVIS) 2-3V RIGHT COMPARISON:  None Available. FINDINGS: There is no evidence of hip fracture or dislocation. There is no evidence of arthropathy or other focal bone abnormality. IMPRESSION: Negative. Electronically Signed   By: Marijo Conception M.D.   On: 05/18/2022 12:54    Procedures Procedures     Medications Ordered in ED Medications  acetaminophen (TYLENOL) tablet 650 mg (650 mg Oral Given 05/18/22 1737)    ED Course/ Medical Decision Making/ A&P Clinical Course as of 05/18/22 2018  Thu May 18, 2022  1938 Patient having difficulty with ambulation that she reports is secondary to feeling unsteady with a walker here.  Family would like potential rehab due to her frequent falls from her arthritis in her left knee.  Social work will be consulted. [VK]  2014 Patient's son spoke with her assisted living facility where there is a rehab present in the state that they would be able to get her into the rehab unit  tonight as long as she had paperwork specifying this.  I spoke with our social work here who states that on her discharge papers to write that we recommend skilled nursing facility level of care.  Family is agreeable with this plan. [VK]    Clinical Course User Index [VK] Kemper Durie, DO                           Medical Decision Making This patient presents to the ED with chief complaint(s) of fall with pertinent past medical history of HTN, hiatal hernia, depression which further complicates the presenting complaint. The complaint involves an extensive differential diagnosis and also carries with it a high risk of complications and morbidity.    The differential diagnosis includes considering ICH or mass effect, hip fracture, no other signs of traumatic injury on exam, no presyncopal symptoms a syncopal fall unlikely  Additional history obtained: Additional history obtained from family Records reviewed [place]  ED Course and Reassessment: Patient was initially evaluated by triage and had head CT and hip x-ray performed.  Her imaging showed no signs of acute traumatic injury.  She has no other signs of trauma on exam.  She was given Tylenol for pain and will have ambulatory trial.  She likely will be discharged as long as she can ambulate.  She reports  she normally ambulates with a walker.  Independent labs interpretation:  N/A  Independent visualization of imaging: - I independently visualized the following imaging with scope of interpretation limited to determining acute life threatening conditions related to emergency care: R hip XR, CTH, which revealed no acute traumatic injury  Consultation: - Consulted or discussed management/test interpretation w/ external professional: SW  Consideration for admission or further workup: Patient has no emergent conditions requiring admission or further work-up at this time and is stable for discharge home with primary care follow-up  Social Determinants of health: N/A    Amount and/or Complexity of Data Reviewed Radiology: ordered.  Risk OTC drugs.          Final Clinical Impression(s) / ED Diagnoses Final diagnoses:  Fall, initial encounter  Bilateral hip pain    Rx / DC Orders ED Discharge Orders     None         Kemper Durie, DO 05/18/22 2018

## 2022-05-19 ENCOUNTER — Encounter: Payer: Self-pay | Admitting: Adult Health

## 2022-05-19 ENCOUNTER — Non-Acute Institutional Stay (SKILLED_NURSING_FACILITY): Payer: Medicare HMO | Admitting: Adult Health

## 2022-05-19 DIAGNOSIS — R69 Illness, unspecified: Secondary | ICD-10-CM | POA: Diagnosis not present

## 2022-05-19 DIAGNOSIS — M1712 Unilateral primary osteoarthritis, left knee: Secondary | ICD-10-CM

## 2022-05-19 DIAGNOSIS — M8949 Other hypertrophic osteoarthropathy, multiple sites: Secondary | ICD-10-CM | POA: Diagnosis not present

## 2022-05-19 DIAGNOSIS — I10 Essential (primary) hypertension: Secondary | ICD-10-CM | POA: Diagnosis not present

## 2022-05-19 DIAGNOSIS — R2689 Other abnormalities of gait and mobility: Secondary | ICD-10-CM | POA: Diagnosis not present

## 2022-05-19 DIAGNOSIS — G3184 Mild cognitive impairment, so stated: Secondary | ICD-10-CM | POA: Diagnosis not present

## 2022-05-19 DIAGNOSIS — M7062 Trochanteric bursitis, left hip: Secondary | ICD-10-CM

## 2022-05-19 DIAGNOSIS — R296 Repeated falls: Secondary | ICD-10-CM | POA: Diagnosis not present

## 2022-05-19 DIAGNOSIS — K5909 Other constipation: Secondary | ICD-10-CM

## 2022-05-19 DIAGNOSIS — E890 Postprocedural hypothyroidism: Secondary | ICD-10-CM

## 2022-05-19 DIAGNOSIS — M810 Age-related osteoporosis without current pathological fracture: Secondary | ICD-10-CM

## 2022-05-19 DIAGNOSIS — R531 Weakness: Secondary | ICD-10-CM

## 2022-05-19 DIAGNOSIS — Z9181 History of falling: Secondary | ICD-10-CM | POA: Diagnosis not present

## 2022-05-19 DIAGNOSIS — F325 Major depressive disorder, single episode, in full remission: Secondary | ICD-10-CM

## 2022-05-19 DIAGNOSIS — R278 Other lack of coordination: Secondary | ICD-10-CM | POA: Diagnosis not present

## 2022-05-19 NOTE — Progress Notes (Signed)
Location:  Medical illustrator of Service:  SNF (31) Provider:  Fletcher Anon, NP  Mahlon Gammon, MD  Patient Care Team: Mahlon Gammon, MD as PCP - General (Internal Medicine) Bernette Redbird, MD as Consulting Physician (Gastroenterology) Dannielle Huh, MD as Consulting Physician (Orthopedic Surgery) Ellen Henri as Consulting Physician (Dermatology) Cassell Clement, MD as Consulting Physician (Cardiology) Maris Berger, MD as Consulting Physician (Ophthalmology) Glennis Brink, MD as Referring Physician (Dermatology)  Extended Emergency Contact Information Primary Emergency Contact: Berton Mount of [place] Home Phone: [phone] Relation: Daughter Secondary Emergency Contact: [facility] Address: [phone] [address] [place] [address] Darden Amber of [place] Home Phone: [phone] Mobile Phone: [phone] Relation: Son  Code Status:  DNR Goals of care: Advanced Directive information    05/19/2022   10:07 AM  Advanced Directives  Does Patient Have a Medical Advance Directive? Yes  Type of Advance Directive Living will;Healthcare Power of [place];Out of facility DNR (pink MOST or yellow form)  Does patient want to make changes to medical advance directive? No - Patient declined  Copy of Healthcare Power of Attorney in Chart? Yes - validated most recent copy scanned in chart (See row information)     Chief Complaint  Patient presents with   Acute Visit    F/u after a fall and ER visit.     HPI:  Pt is a 88 y.o. female seen today for follow up after a fall and ER visit.  PMH significant for MCI, left knee OA, chronic back pain, Left hip bursitis, HTN, OP, hearing loss, umbilical hernia, hiatal hernia, GIB, depression.  Presented to the ER 05/18/22. She fell and was found on the floor on her back and reported hitting her head. She reports that her knee and back gave out. No dizziness or syncope.   CT of  the head was negative for acute intracranial abnormality.   After the fall she felt weak and needed help with ambulation. She returned to skilled care rehab due to increased care needs. Previously she resided in [place] and used a walker independently but was having increased falls there as well.   The nurse reports that today she was able to walk with her walker and does not have any pain (other than chronic). She does need reminders to call for help.       Past Medical History:  Diagnosis Date   Arthritis    "all over"   BACK PAIN 03/08/2007   Cancer (HCC)    squamous cell on R leg & face- ?basal cell   Cutaneous abscess of right lower limb    DEPRESSIVE DISORDER 12/01/2008   Disruption of external operation (surgical) wound, not elsewhere classified, initial encounter    Duodenal ulcer    Essential (primary) hypertension    GI bleed 09/12/2010   Naproxen induced Endoscopy with dr Marjorie Smolder    HIATAL HERNIA 12/17/2006   History of hiatal hernia    HYPERLIPIDEMIA 03/09/2006   Hyperlipidemia, unspecified    HYPERTENSION 03/09/2006   KNEE PAIN 05/06/2009   Macular degeneration    Major depressive disorder, single episode, unspecified    Pneumonia    65yrs. ago- hosp. pneumonia   Urinary urgency    Past Surgical History:  Procedure Laterality Date   ABDOMINAL HYSTERECTOMY  1975   APPLICATION OF A-CELL OF EXTREMITY Right 12/10/2014   Procedure: APPLICATION OF A-CELL OF EXTREMITY;  Surgeon: Wayland Denis, DO;  Location: [facility] OR;  Service: Plastics;  Laterality: Right;   arthroscopic knee Right    x 2   BACK SURGERY  2000   spinal fusion   BILATERAL SALPINGECTOMY Bilateral 1989   Dr. Gavin Potters   CARDIAC CATHETERIZATION     EYE SURGERY     cataracts bilateral - removed   HERNIA REPAIR Bilateral    inguinal    I & D EXTREMITY Right 12/10/2014   Procedure: IRRIGATION AND DEBRIDEMENT ON RIGHT LEG, ;  Surgeon: Wayland Denis, DO;  Location: [facility] OR;  Service: Plastics;  Laterality: Right;   I  & D EXTREMITY Right 01/20/2015   Procedure: IRRIGATION AND DEBRIDEMENT RIGHT LOWER LEG WOUND, with ACELL to Donor Site, SKIN GRAFT AND VAC Placement;  Surgeon: Wayland Denis, DO;  Location: [facility] OR;  Service: Plastics;  Laterality: Right;   LUMBAR LAMINECTOMY  02-17-1999   with sinal fusion L3,4,5,&S1   moes     moses surgery on R lle   OOPHORECTOMY  1989   RESECTION DISTAL CLAVICAL     ROTATOR CUFF REPAIR Right 2001   x2   SKIN GRAFT Right 05/29/2018   THYROIDECTOMY     TONSILLECTOMY  1928   TUBAL LIGATION      Allergies  Allergen Reactions   Naproxen     Gi bleed   Nsaids     GI BLEED   Aspirin     GI bleed   Ace Inhibitors     Cough    Caffeine Other (See Comments)    Causes joints to be painful    Outpatient Encounter Medications as of 05/19/2022  Medication Sig   acetaminophen (TYLENOL) 325 MG tablet Take 325 mg by mouth once. Take 650 mg , oral , once a morning   acetaminophen (TYLENOL) 500 MG tablet Take 1,000 mg by mouth in the morning and at bedtime.   Ascorbic Acid (VITAMIN C) 1000 MG tablet Take 1,000 mg by mouth daily.   Carboxymethylcellulose Sodium (THERATEARS) 0.25 % SOLN Apply 2-3 drops to eye in the morning, at noon, in the evening, and at bedtime.   Cholecalciferol (VITAMIN D3) 50 MCG (2000 UT) TABS Take 1 tablet by mouth daily.   DULoxetine (CYMBALTA) 20 MG capsule Take 1 capsule (20 mg total) by mouth daily.   losartan (COZAAR) 50 MG tablet Take 1.5 tablets (75 mg total) by mouth daily.   Multiple Vitamins-Minerals (PRESERVISION AREDS 2+MULTI VIT PO) Take by mouth 2 (two) times daily.   Omega-3 1000 MG CAPS Take one capsule by mouth three times daily.   polyethylene glycol powder (GLYCOLAX/MIRALAX) 17 GM/SCOOP powder Take 17 g by mouth daily as needed.   Pregnenolone 50 MG TABS Take 1 tablet by mouth every morning.   sennosides-docusate sodium (SENOKOT-S) 8.6-50 MG tablet Take 2 tablets by mouth daily.   No facility-administered encounter medications on  file as of 05/19/2022.    Review of Systems  Constitutional:  Positive for activity change. Negative for appetite change, chills, diaphoresis, fatigue, fever and unexpected weight change.  HENT:  Negative for congestion.   Respiratory:  Negative for cough, shortness of breath and wheezing.   Cardiovascular:  Negative for chest pain, palpitations and leg swelling.  Gastrointestinal:  Negative for abdominal distention, abdominal pain, constipation and diarrhea.  Genitourinary:  Negative for difficulty urinating and dysuria.  Musculoskeletal:  Positive for arthralgias and gait problem. Negative for joint swelling and myalgias.       Chronic back knee hip pain  Neurological:  Positive for weakness (mild). Negative for dizziness, tremors, seizures, syncope, facial asymmetry, speech difficulty, light-headedness, numbness and headaches.  Psychiatric/Behavioral:  Positive for confusion. Negative for agitation and behavioral problems.     Immunization History  Administered Date(s) Administered   Fluad Quad(high Dose 65+) 04/05/2020   Influenza Split 03/08/2011, 03/05/2012   Influenza Whole 03/08/2007, 03/02/2009, 03/29/2010   Influenza, High Dose Seasonal PF 03/21/2013, 06/13/2017, 03/07/2019, 03/26/2020   Influenza,inj,Quad PF,6+ Mos 02/20/2014, 02/11/2015, 03/22/2018   Influenza-Unspecified 03/23/2016, 04/05/2020, 03/03/2022   Moderna Covid-19 Vaccine Bivalent Booster 72yrs & up 03/10/2021   Moderna SARS-COV2 Booster Vaccination 09/20/2020   Moderna Sars-Covid-2 Vaccination 06/10/2019, 07/09/2019, 10/13/2021, 04/03/2022   PFIZER(Purple Top)SARS-COV-2 Vaccination 04/05/2020   Pneumococcal Conjugate-13 07/30/2014   Pneumococcal Polysaccharide-23 02/03/2002, 12/16/2009   Td 02/03/2002   Tdap 12/11/2012   Zoster Recombinat (Shingrix) 05/10/2022   Pertinent  Health Maintenance Due  Topic Date Due   INFLUENZA VACCINE  Completed   DEXA SCAN  Completed      11/14/2021    3:01 PM 02/17/2022    10:08 AM 05/08/2022    1:48 PM 05/18/2022   11:47 AM 05/19/2022   10:07 AM  Fall Risk  Falls in the past year? 1  1  1   Was there an injury with Fall? 0  0  0  Fall Risk Category Calculator 2  2  2   Fall Risk Category Moderate  Moderate  Moderate  Patient Fall Risk Level Low fall risk High fall risk High fall risk High fall risk High fall risk  Patient at Risk for Falls Due to Impaired balance/gait  History of fall(s);Impaired balance/gait;Impaired mobility  Impaired balance/gait;Impaired mobility  Fall risk Follow up Falls evaluation completed  Falls evaluation completed  Falls evaluation completed   Functional Status Survey:    Vitals:   05/20/22 0901  BP: 106/70  Pulse: 96  Resp: 20  SpO2: 98%  Weight: 124 lb 9.6 oz (56.5 kg)   Body mass index is 24.33 kg/m. Physical Exam Vitals and nursing note reviewed.  Constitutional:      General: She is not in acute distress.    Appearance: She is not diaphoretic.  HENT:     Head: Normocephalic and atraumatic.     Mouth/Throat:     Mouth: Mucous membranes are moist.     Pharynx: Oropharynx is clear.  Eyes:     General:        Right eye: No discharge.        Left eye: No discharge.     Extraocular Movements: Extraocular movements intact.     Conjunctiva/sclera: Conjunctivae normal.     Pupils: Pupils are equal, round, and reactive to light.  Neck:     Vascular: No JVD.  Cardiovascular:     Rate and Rhythm: Normal rate and regular rhythm.     Heart sounds: No murmur heard. Pulmonary:     Effort: Pulmonary effort is normal. No respiratory distress.     Breath sounds: Normal breath sounds. No wheezing.  Musculoskeletal:     Right lower leg: No edema.     Left lower leg: No edema.     Comments: Left knee with crepitus. NO pain with ROM.  Bilateral hip joints with no pain on ROM Bilateral elbow, shoulder joints with no pain on ROM Right knee to pain on ROM.   Skin:    General: Skin is warm and dry.  Neurological:      General: No focal deficit present.  Mental Status: She is alert. Mental status is at baseline.     Cranial Nerves: No cranial nerve deficit.     Labs reviewed: Recent Labs    09/15/21 0000 02/17/22 1010 03/03/22 0000  NA 140 141 141  K 4.1 3.6 4.1  CL 103 109 102  CO2 25* 26 27*  GLUCOSE  --  95  --   BUN 21 19 18   CREATININE 0.6 0.49 0.7  CALCIUM 9.6 8.6* 9.4   Recent Labs    09/15/21 0000 03/03/22 0000  AST 21 20  ALT 14 14  ALKPHOS 57 52  ALBUMIN 4.5 4.6   Recent Labs    09/15/21 0000 02/17/22 1010  WBC 4.9 5.2  HGB 12.2 11.4*  HCT 37 35.3*  MCV  --  93.6  PLT 152 153   Lab Results  Component Value Date   TSH 1.39 01/02/2022   No results found for: "HGBA1C" Lab Results  Component Value Date   CHOL 238 (A) 08/20/2018   HDL 67 08/20/2018   LDLCALC 154 08/20/2018   LDLDIRECT 118.7 07/20/2011   TRIG 85 08/20/2018   CHOLHDL 4 12/30/2013    Significant Diagnostic Results in last 30 days:  CT Head Wo Contrast  Result Date: 05/18/2022 CLINICAL DATA:  Trauma EXAM: CT HEAD WITHOUT CONTRAST TECHNIQUE: Contiguous axial images were obtained from the base of the skull through the vertex without intravenous contrast. RADIATION DOSE REDUCTION: This exam was performed according to the departmental dose-optimization program which includes automated exposure control, adjustment of the mA and/or kV according to patient size and/or use of iterative reconstruction technique. COMPARISON:  None Available. FINDINGS: Brain: No evidence of acute infarction, hemorrhage, hydrocephalus, extra-axial collection or mass lesion/mass effect. Vascular: No hyperdense vessel or unexpected calcification. Skull: Normal. Negative for fracture or focal lesion. Sinuses/Orbits: Bilateral lens replacement. Paranasal sinuses are clear. Other: None. IMPRESSION: No CT evidence of intracranial injury. No acute intracranial abnormality. Electronically Signed   By: Lorenza Cambridge M.D.   On: 05/18/2022  13:14   DG Hip Unilat  With Pelvis 2-3 Views Right  Result Date: 05/18/2022 CLINICAL DATA:  Right hip pain after fall. EXAM: DG HIP (WITH OR WITHOUT PELVIS) 2-3V RIGHT COMPARISON:  None Available. FINDINGS: There is no evidence of hip fracture or dislocation. There is no evidence of arthropathy or other focal bone abnormality. IMPRESSION: Negative. Electronically Signed   By: Lupita Raider M.D.   On: 05/18/2022 12:54    Assessment/Plan  1. Weakness Mild, no sign of acute infection Seems to nearing baseline PT and OT ordered  Will stay in rehab for therapy  2. Primary osteoarthritis of left knee Severe, using tylenol for pain which is controlled.   3. Falls frequently Due to severe left knee OA, weakness, poor judgement  4. Essential hypertension Initially elevated but improved.   5. Mild cognitive impairment with memory loss  MMSE 26/30 02/15/22 passed clock word finding and executive functioning issues.   6. Senile osteoporosis T score -4.5 04/15/19, worse from 2018 -4 June of 2021 was on evenity. Started Prolia 4/23   7. Major depressive disorder with single episode, in full remission (HCC) Does not appear depressed, Continue cymbalta..   8. History of partial thyroidectomy Lab Results  Component Value Date   TSH 1.39 01/02/2022    9. Trochanteric bursitis of left hip Not current a problem Has received injections in the past   10. Chronic constipation Continue senokot and prn miralax.    Labs/tests  ordered:  NA

## 2022-05-24 DIAGNOSIS — N390 Urinary tract infection, site not specified: Secondary | ICD-10-CM | POA: Diagnosis not present

## 2022-05-31 DIAGNOSIS — R296 Repeated falls: Secondary | ICD-10-CM | POA: Diagnosis not present

## 2022-06-01 DIAGNOSIS — R296 Repeated falls: Secondary | ICD-10-CM | POA: Diagnosis not present

## 2022-06-02 DIAGNOSIS — R296 Repeated falls: Secondary | ICD-10-CM | POA: Diagnosis not present

## 2022-06-05 DIAGNOSIS — R296 Repeated falls: Secondary | ICD-10-CM | POA: Diagnosis not present

## 2022-06-07 DIAGNOSIS — R296 Repeated falls: Secondary | ICD-10-CM | POA: Diagnosis not present

## 2022-06-09 DIAGNOSIS — R296 Repeated falls: Secondary | ICD-10-CM | POA: Diagnosis not present

## 2022-06-14 DIAGNOSIS — L814 Other melanin hyperpigmentation: Secondary | ICD-10-CM | POA: Diagnosis not present

## 2022-06-14 DIAGNOSIS — D492 Neoplasm of unspecified behavior of bone, soft tissue, and skin: Secondary | ICD-10-CM | POA: Diagnosis not present

## 2022-06-14 DIAGNOSIS — D1801 Hemangioma of skin and subcutaneous tissue: Secondary | ICD-10-CM | POA: Diagnosis not present

## 2022-06-14 DIAGNOSIS — R296 Repeated falls: Secondary | ICD-10-CM | POA: Diagnosis not present

## 2022-06-14 DIAGNOSIS — L821 Other seborrheic keratosis: Secondary | ICD-10-CM | POA: Diagnosis not present

## 2022-06-14 DIAGNOSIS — L570 Actinic keratosis: Secondary | ICD-10-CM | POA: Diagnosis not present

## 2022-06-15 DIAGNOSIS — R296 Repeated falls: Secondary | ICD-10-CM | POA: Diagnosis not present

## 2022-06-27 ENCOUNTER — Encounter: Payer: Self-pay | Admitting: Orthopedic Surgery

## 2022-06-27 ENCOUNTER — Non-Acute Institutional Stay: Payer: Medicare HMO | Admitting: Orthopedic Surgery

## 2022-06-27 DIAGNOSIS — R600 Localized edema: Secondary | ICD-10-CM | POA: Diagnosis not present

## 2022-06-27 NOTE — Progress Notes (Signed)
Location:  [place] Room Number: 639/A Place of Service:  ALF [phone]) Provider:  Yvonna Alanis, NP   Virgie Dad, MD  Patient Care Team: Virgie Dad, MD as PCP - General (Internal Medicine) Ronald Lobo, MD as Consulting Physician (Gastroenterology) Vickey Huger, MD as Consulting Physician (Orthopedic Surgery) Izora Ribas as Consulting Physician (Dermatology) Darlin Coco, MD as Consulting Physician (Cardiology) Luberta Mutter, MD as Consulting Physician (Ophthalmology) Karin Golden, MD as Referring Physician (Dermatology)  Extended Emergency Contact Information Primary Emergency Contact: Clearence Ped of [place] Phone: [phone] Relation: Daughter Secondary Emergency Contact: [facility] Address: [place] [address] Johnnette Litter of [place] Phone: [phone] Mobile Phone: [phone] Relation: Son  Code Status:  DNR Goals of care: Advanced Directive information    05/19/2022   10:07 AM  Advanced Directives  Does Patient Have a Medical Advance Directive? Yes  Type of Advance Directive Living will;Healthcare Power of [place];Out of facility DNR (pink MOST or yellow form)  Does patient want to make changes to medical advance directive? No - Patient declined  Copy of [place] in Chart? Yes - validated most recent copy scanned in chart (See row information)     Chief Complaint  Patient presents with   Acute Visit    Left leg swelling     HPI:  Pt is a 89 y.o. female seen today for acute visit due to recent left leg swelling.   She currently resides on the assisted living unit at [facility] PMH: HTN, HLD, LBBB, hiatal hernia, constipation, cognitive impairment, OA, osteoporosis, and depression.   Family present during encounter. They noted increased swelling to left leg between knee and ankle yesterday. She had a noticeable indentation  to back of left calf after leg brace was removed. LLE swelling appears to have resolved today. She has been elevating extremities in recliner. Denies left leg pain, chest pain and sob. No h/o blood clot. No recent surgery, fall or injury. Admits to enjoying salty foods like chips.     Past Medical History:  Diagnosis Date   Arthritis    "all over"   BACK PAIN 03/08/2007   Cancer ([place])    squamous cell on R leg & face- ?basal cell   Cutaneous abscess of right lower limb    DEPRESSIVE DISORDER 12/01/2008   Disruption of external operation (surgical) wound, not elsewhere classified, initial encounter    Duodenal ulcer    Essential (primary) hypertension    GI bleed 09/12/2010   Naproxen induced Endoscopy with dr Harlene Ramus    HIATAL HERNIA 12/17/2006   History of hiatal hernia    HYPERLIPIDEMIA 03/09/2006   Hyperlipidemia, unspecified    HYPERTENSION 03/09/2006   KNEE PAIN 05/06/2009   Macular degeneration    Major depressive disorder, single episode, unspecified    Pneumonia    43yr. ago- hosp. pneumonia   Urinary urgency    Past Surgical History:  Procedure Laterality Date   ABDOMINAL HYSTERECTOMY  16269  APPLICATION OF A-CELL OF EXTREMITY Right 12/10/2014   Procedure: APPLICATION OF A-CELL OF EXTREMITY;  Surgeon: CTheodoro Kos DO;  [facility]  Service: Plastics;  Laterality: Right;   arthroscopic knee Right    x 2   BACK SURGERY  2000   spinal fusion   BILATERAL SALPINGECTOMY Bilateral 1989   Dr. LMarianna Fuss  CARDIAC CATHETERIZATION     EYE SURGERY  cataracts bilateral - removed   HERNIA REPAIR Bilateral    inguinal    I & D EXTREMITY Right 12/10/2014   Procedure: IRRIGATION AND DEBRIDEMENT ON RIGHT LEG, ;  Surgeon: Theodoro Kos, DO;  Location: [facility];  Service: Plastics;  Laterality: Right;   I & D EXTREMITY Right 01/20/2015   Procedure: IRRIGATION AND DEBRIDEMENT RIGHT LOWER LEG WOUND, with ACELL to Donor Site, SKIN GRAFT AND VAC Placement;  Surgeon: Theodoro Kos,  DO;  Location: [facility];  Service: Plastics;  Laterality: Right;   LUMBAR LAMINECTOMY  02-17-1999   with sinal fusion L3,4,5,&S1   moes     moses surgery on R lle   OOPHORECTOMY  1989   RESECTION DISTAL CLAVICAL     ROTATOR CUFF REPAIR Right 2001   x2   SKIN GRAFT Right 05/29/2018   THYROIDECTOMY     TONSILLECTOMY  1928   TUBAL LIGATION      Allergies  Allergen Reactions   Naproxen     Gi bleed   Nsaids     GI BLEED   Aspirin     GI bleed   Ace Inhibitors     Cough    Caffeine Other (See Comments)    Causes joints to be painful    Outpatient Encounter Medications as of 06/27/2022  Medication Sig   acetaminophen (TYLENOL) 325 MG tablet Take 325 mg by mouth once. Take 650 mg , oral , once a morning   acetaminophen (TYLENOL) 500 MG tablet Take 1,000 mg by mouth in the morning and at bedtime.   Ascorbic Acid (VITAMIN C) 1000 MG tablet Take 1,000 mg by mouth daily.   Carboxymethylcellulose Sodium (THERATEARS) 0.25 % SOLN Apply 2-3 drops to eye in the morning, at noon, in the evening, and at bedtime.   Cholecalciferol (VITAMIN D3) 50 MCG (2000 UT) TABS Take 1 tablet by mouth daily.   DULoxetine (CYMBALTA) 20 MG capsule Take 1 capsule (20 mg total) by mouth daily.   losartan (COZAAR) 50 MG tablet Take 1.5 tablets (75 mg total) by mouth daily.   Multiple Vitamins-Minerals (PRESERVISION AREDS 2+MULTI VIT PO) Take by mouth 2 (two) times daily.   Omega-3 1000 MG CAPS Take one capsule by mouth three times daily.   polyethylene glycol powder (GLYCOLAX/MIRALAX) 17 GM/SCOOP powder Take 17 g by mouth daily as needed.   Pregnenolone 50 MG TABS Take 1 tablet by mouth every morning.   sennosides-docusate sodium (SENOKOT-S) 8.6-50 MG tablet Take 2 tablets by mouth daily.   No facility-administered encounter medications on file as of 06/27/2022.    Review of Systems  Constitutional:  Negative for activity change and appetite change.  Respiratory:  Negative for cough, shortness of breath and  wheezing.   Cardiovascular:  Positive for leg swelling. Negative for chest pain.  Musculoskeletal:  Positive for arthralgias and gait problem.  Neurological:  Negative for dizziness and numbness.  Psychiatric/Behavioral:  Positive for confusion and dysphoric mood. The patient is not nervous/anxious.     Immunization History  Administered Date(s) Administered   Fluad Quad(high Dose 65+) 04/05/2020   Influenza Split 03/08/2011, 03/05/2012   Influenza Whole 03/08/2007, 03/02/2009, 03/29/2010   Influenza, High Dose Seasonal PF 03/21/2013, 06/13/2017, 03/07/2019, 03/26/2020   Influenza,inj,Quad PF,6+ Mos 02/20/2014, 02/11/2015, 03/22/2018   Influenza-Unspecified 03/23/2016, 04/05/2020, 03/03/2022   Moderna Covid-19 Vaccine Bivalent Booster 8yr & up 03/10/2021   Moderna SARS-COV2 Booster Vaccination 09/20/2020   Moderna Sars-Covid-2 Vaccination 06/10/2019, 07/09/2019, 10/13/2021, 04/03/2022   PFIZER(Purple Top)SARS-COV-2 Vaccination 04/05/2020  Pneumococcal Conjugate-13 07/30/2014   Pneumococcal Polysaccharide-23 02/03/2002, 12/16/2009   Td 02/03/2002   Tdap 12/11/2012   Zoster Recombinat (Shingrix) 05/10/2022   Pertinent  Health Maintenance Due  Topic Date Due   INFLUENZA VACCINE  Completed   DEXA SCAN  Completed      11/14/2021    3:01 PM 02/17/2022   10:08 AM 05/08/2022    1:48 PM 05/18/2022   11:47 AM 05/19/2022   10:07 AM  Fall Risk  Falls in the past year? '1  1  1  '$ Was there an injury with Fall? 0  0  0  Fall Risk Category Calculator '2  2  2  '$ Fall Risk Category (Retired) Moderate  Moderate  Moderate  (RETIRED) Patient Fall Risk Level Low fall risk High fall risk High fall risk High fall risk High fall risk  Patient at Risk for Falls Due to Impaired balance/gait  History of fall(s);Impaired balance/gait;Impaired mobility  Impaired balance/gait;Impaired mobility  Fall risk Follow up Falls evaluation completed  Falls evaluation completed  Falls evaluation completed    Functional Status Survey:    Vitals:   06/27/22 1612  BP: (!) 146/76  Pulse: 85  Resp: 18  Temp: (!) 97.3 F (36.3 C)  SpO2: 94%  Weight: 122 lb 6.4 oz (55.5 kg)  Height: 5' (1.524 m)   Body mass index is 23.9 kg/m. Physical Exam Vitals reviewed.  Constitutional:      General: She is not in acute distress. HENT:     Head: Normocephalic.  Eyes:     General:        Right eye: No discharge.        Left eye: No discharge.  Cardiovascular:     Rate and Rhythm: Normal rate and regular rhythm.     Pulses:          Dorsalis pedis pulses are 0 on the left side.       Posterior tibial pulses are 1+ on the left side.     Heart sounds: Normal heart sounds.  Pulmonary:     Effort: Pulmonary effort is normal. No respiratory distress.     Breath sounds: Normal breath sounds. No wheezing or rales.  Musculoskeletal:     Cervical back: Neck supple.     Right lower leg: No edema.     Left lower leg: No swelling, deformity or tenderness. No edema.     Comments: Left lower extremity cool to touch  Feet:     Left foot:     Skin integrity: Dry skin present. No skin breakdown or warmth.     Toenail Condition: Left toenails are normal.  Skin:    General: Skin is warm and dry.     Capillary Refill: Capillary refill takes less than 2 seconds.     Comments: Scarring to left posterior calf, no skin breakdown  Neurological:     General: No focal deficit present.     Mental Status: She is alert and oriented to person, place, and time.     Motor: Weakness present.     Gait: Gait abnormal.     Comments: rolator  Psychiatric:        Mood and Affect: Mood normal.        Behavior: Behavior normal.     Labs reviewed: Recent Labs    02/17/22 1010 03/03/22 0000 04/18/22 1345  NA 141 141 143  K 3.6 4.1 4.4  CL 109 102 103  CO2 26 27* 25*  GLUCOSE 95  --   --   BUN '19 18 14  '$ CREATININE 0.49 0.7 0.6  CALCIUM 8.6* 9.4 10.2   Recent Labs    09/15/21 0000 03/03/22 0000  AST 21  20  ALT 14 14  ALKPHOS 57 52  ALBUMIN 4.5 4.6   Recent Labs    09/15/21 0000 02/17/22 1010 04/18/22 1345  WBC 4.9 5.2 6.1  HGB 12.2 11.4* 12.9  HCT 37 35.3* 41  MCV  --  93.6  --   PLT 152 153 291   Lab Results  Component Value Date   TSH 1.39 01/02/2022   No results found for: "HGBA1C" Lab Results  Component Value Date   CHOL 238 (A) 08/20/2018   HDL 67 08/20/2018   LDLCALC 154 08/20/2018   LDLDIRECT 118.7 07/20/2011   TRIG 85 08/20/2018   CHOLHDL 4 12/30/2013    Significant Diagnostic Results in last 30 days:  No results found.  Assessment/Plan 1. Edema of left lower leg - 01/29 increased edema per family - LLE unremarkable today, extremity cool to touch, no pain> no not suspect DVT - recommend light compression for intermittent edema - advised to limit sodium intake - cont to elevate legs with recliner    Family/ staff Communication: plan discussed with family and patient  Labs/tests ordered:  none

## 2022-06-28 DIAGNOSIS — R296 Repeated falls: Secondary | ICD-10-CM | POA: Diagnosis not present

## 2022-07-05 DIAGNOSIS — R296 Repeated falls: Secondary | ICD-10-CM | POA: Diagnosis not present

## 2022-07-19 DIAGNOSIS — D1801 Hemangioma of skin and subcutaneous tissue: Secondary | ICD-10-CM | POA: Diagnosis not present

## 2022-07-19 DIAGNOSIS — D492 Neoplasm of unspecified behavior of bone, soft tissue, and skin: Secondary | ICD-10-CM | POA: Diagnosis not present

## 2022-07-19 DIAGNOSIS — L814 Other melanin hyperpigmentation: Secondary | ICD-10-CM | POA: Diagnosis not present

## 2022-07-19 DIAGNOSIS — L570 Actinic keratosis: Secondary | ICD-10-CM | POA: Diagnosis not present

## 2022-08-08 ENCOUNTER — Non-Acute Institutional Stay: Payer: Medicare HMO | Admitting: Internal Medicine

## 2022-08-08 ENCOUNTER — Encounter: Payer: Self-pay | Admitting: Internal Medicine

## 2022-08-08 VITALS — BP 128/72 | HR 97 | Temp 97.6°F | Resp 18 | Ht 60.0 in

## 2022-08-08 DIAGNOSIS — T148XXA Other injury of unspecified body region, initial encounter: Secondary | ICD-10-CM | POA: Diagnosis not present

## 2022-08-08 DIAGNOSIS — R296 Repeated falls: Secondary | ICD-10-CM | POA: Diagnosis not present

## 2022-08-08 DIAGNOSIS — M1712 Unilateral primary osteoarthritis, left knee: Secondary | ICD-10-CM

## 2022-08-08 DIAGNOSIS — D509 Iron deficiency anemia, unspecified: Secondary | ICD-10-CM | POA: Diagnosis not present

## 2022-08-08 DIAGNOSIS — G3184 Mild cognitive impairment, so stated: Secondary | ICD-10-CM

## 2022-08-08 DIAGNOSIS — R69 Illness, unspecified: Secondary | ICD-10-CM | POA: Diagnosis not present

## 2022-08-08 DIAGNOSIS — I10 Essential (primary) hypertension: Secondary | ICD-10-CM

## 2022-08-08 DIAGNOSIS — F325 Major depressive disorder, single episode, in full remission: Secondary | ICD-10-CM

## 2022-08-08 LAB — CBC AND DIFFERENTIAL
HCT: 35 — AB (ref 36–46)
Hemoglobin: 11.5 — AB (ref 12.0–16.0)
Platelets: 157 10*3/uL (ref 150–400)
WBC: 4.9

## 2022-08-08 LAB — BASIC METABOLIC PANEL
BUN: 21 (ref 4–21)
CO2: 26 — AB (ref 13–22)
Chloride: 101 (ref 99–108)
Creatinine: 0.6 (ref 0.5–1.1)
Glucose: 95
Potassium: 4 mEq/L (ref 3.5–5.1)
Sodium: 138 (ref 137–147)

## 2022-08-08 LAB — CBC: RBC: 378 — AB (ref 3.87–5.11)

## 2022-08-08 LAB — COMPREHENSIVE METABOLIC PANEL
Calcium: 9.4 (ref 8.7–10.7)
eGFR: 78

## 2022-08-08 NOTE — Progress Notes (Signed)
Location:  [place] of Service:  Clinic (12)  Provider:   Code Status: DNR Goals of Care:     08/08/2022    1:27 PM  Advanced Directives  Does Patient Have a Medical Advance Directive? Yes  Does patient want to make changes to medical advance directive? No - Patient declined     Chief Complaint  Patient presents with  . Medical Management of Chronic Issues    3 month follow up. Patient has had 11th fall in the last 6 months yesterday.orders    HPI: Patient is a 89 y.o. female seen today for medical management of chronic diseases.     Past Medical History:  Diagnosis Date  . Arthritis    "all over"  . BACK PAIN 03/08/2007  . 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 Cutaneous abscess of right lower limb   . DEPRESSIVE DISORDER 12/01/2008  . Disruption of external operation (surgical) wound, not elsewhere classified, initial encounter   . Duodenal ulcer   . Essential (primary) hypertension   . GI bleed 09/12/2010   Naproxen induced Endoscopy with dr Harlene Ramus   . HIATAL HERNIA 12/17/2006  . History of hiatal hernia   . HYPERLIPIDEMIA 03/09/2006  . Hyperlipidemia, unspecified   . HYPERTENSION 03/09/2006  . KNEE PAIN 05/06/2009  . Macular degeneration   . Major depressive disorder, single episode, unspecified   . Pneumonia    75yrs. ago- hosp. pneumonia  . Urinary urgency     Past Surgical History:  Procedure Laterality Date  . ABDOMINAL HYSTERECTOMY  1975  . APPLICATION OF A-CELL OF EXTREMITY Right 12/10/2014   Procedure: APPLICATION OF A-CELL OF EXTREMITY;  Surgeon: Theodoro Kos, DO;  Location: [place];  Service: Plastics;  Laterality: Right;  . arthroscopic knee Right    x 2  . BACK SURGERY  2000   spinal fusion  . BILATERAL SALPINGECTOMY Bilateral 1989   Dr. Marianna Fuss  . CARDIAC CATHETERIZATION    . EYE SURGERY     cataracts bilateral - removed  . HERNIA REPAIR Bilateral    inguinal   . I & D EXTREMITY  Right 12/10/2014   Procedure: IRRIGATION AND DEBRIDEMENT ON RIGHT LEG, ;  Surgeon: Theodoro Kos, DO;  Location: [place];  Service: Plastics;  Laterality: Right;  . I & D EXTREMITY Right 01/20/2015   Procedure: IRRIGATION AND DEBRIDEMENT RIGHT LOWER LEG WOUND, with ACELL to Donor Site, SKIN GRAFT AND VAC Placement;  Surgeon: Theodoro Kos, DO;  Location: [place];  Service: Plastics;  Laterality: Right;  . LUMBAR LAMINECTOMY  02-17-1999   with sinal fusion L3,4,5,&S1  . moes     moses surgery on R lle  . OOPHORECTOMY  1989  . RESECTION DISTAL CLAVICAL    . ROTATOR CUFF REPAIR Right 2001   x2  . SKIN GRAFT Right 05/29/2018  . THYROIDECTOMY    . TONSILLECTOMY  1928  . TUBAL LIGATION      Allergies  Allergen Reactions  . Naproxen     Gi bleed  . Nsaids     GI BLEED  . Aspirin     GI bleed  . Ace Inhibitors     Cough   . Caffeine Other (See Comments)    Causes joints to be painful    Outpatient Encounter Medications as of 08/08/2022  Medication Sig  . acetaminophen (TYLENOL) 325 MG tablet Take 325 mg by mouth once. Take  650 mg , oral , once a morning  . acetaminophen (TYLENOL) 500 MG tablet Take 1,000 mg by mouth in the morning and at bedtime.  . Ascorbic Acid (VITAMIN C) 1000 MG tablet Take 1,000 mg by mouth daily.  . Carboxymethylcellulose Sodium (THERATEARS) 0.25 % SOLN Apply 2-3 drops to eye in the morning, at noon, in the evening, and at bedtime.  . Cholecalciferol (VITAMIN D3) 50 MCG (2000 UT) TABS Take 1 tablet by mouth daily.  . DULoxetine (CYMBALTA) 20 MG capsule Take 1 capsule (20 mg total) by mouth daily.  Marland Kitchen losartan (COZAAR) 50 MG tablet Take 1.5 tablets (75 mg total) by mouth daily.  . Multiple Vitamins-Minerals (PRESERVISION AREDS 2+MULTI VIT PO) Take by mouth 2 (two) times daily.  . Omega-3 1000 MG CAPS Take one capsule by mouth three times daily.  . polyethylene glycol powder (GLYCOLAX/MIRALAX) 17 GM/SCOOP powder Take 17 g by mouth daily as needed.  . Pregnenolone 50  MG TABS Take 1 tablet by mouth every morning.  . sennosides-docusate sodium (SENOKOT-S) 8.6-50 MG tablet Take 2 tablets by mouth daily.  Marland Kitchen SPIKEVAX syringe Inject 0.5 mLs into the muscle once.   No facility-administered encounter medications on file as of 08/08/2022.    Review of Systems:  Review of Systems  Health Maintenance  Topic Date Due  . COVID-19 Vaccine (7 - 2023-24 season) 08/24/2022 (Originally 05/29/2022)  . Medicare Annual Wellness (AWV)  10/12/2022  . DTaP/Tdap/Td (3 - Td or Tdap) 12/12/2022  . Pneumonia Vaccine 34+ Years old  Completed  . INFLUENZA VACCINE  Completed  . DEXA SCAN  Completed  . Zoster Vaccines- Shingrix  Completed  . HPV VACCINES  Aged Out    Physical Exam: Vitals:   08/08/22 1331  BP: 128/72  Pulse: 97  Resp: 18  Temp: 97.6 F (36.4 C)  TempSrc: Temporal  SpO2: 97%  Height: 5' (1.524 m)   Body mass index is 23.9 kg/m. Physical Exam  Labs reviewed: Basic Metabolic Panel: Recent Labs    01/02/22 0000 02/17/22 1010 03/03/22 0000 04/18/22 1345  NA  --  141 141 143  K  --  3.6 4.1 4.4  CL  --  109 102 103  CO2  --  26 27* 25*  GLUCOSE  --  95  --   --   BUN  --  19 18 14   CREATININE  --  0.49 0.7 0.6  CALCIUM  --  8.6* 9.4 10.2  TSH 1.39  --   --   --    Liver Function Tests: Recent Labs    09/15/21 0000 03/03/22 0000  AST 21 20  ALT 14 14  ALKPHOS 57 52  ALBUMIN 4.5 4.6   No results for input(s): "LIPASE", "AMYLASE" in the last 8760 hours. No results for input(s): "AMMONIA" in the last 8760 hours. CBC: Recent Labs    09/15/21 0000 02/17/22 1010 04/18/22 1345  WBC 4.9 5.2 6.1  HGB 12.2 11.4* 12.9  HCT 37 35.3* 41  MCV  --  93.6  --   PLT 152 153 291   Lipid Panel: No results for input(s): "CHOL", "HDL", "LDLCALC", "TRIG", "CHOLHDL", "LDLDIRECT" in the last 8760 hours. No results found for: "HGBA1C"  Procedures since last visit: No results found.  Assessment/Plan There are no diagnoses linked to this  encounter. Falls Skin tear Orthostatic Skilled   Labs/tests ordered:  * No order type specified * Next appt:  09/18/2022

## 2022-08-28 DIAGNOSIS — H35033 Hypertensive retinopathy, bilateral: Secondary | ICD-10-CM | POA: Diagnosis not present

## 2022-08-28 DIAGNOSIS — H353221 Exudative age-related macular degeneration, left eye, with active choroidal neovascularization: Secondary | ICD-10-CM | POA: Diagnosis not present

## 2022-08-28 DIAGNOSIS — H353113 Nonexudative age-related macular degeneration, right eye, advanced atrophic without subfoveal involvement: Secondary | ICD-10-CM | POA: Diagnosis not present

## 2022-08-28 DIAGNOSIS — H43813 Vitreous degeneration, bilateral: Secondary | ICD-10-CM | POA: Diagnosis not present

## 2022-09-14 DIAGNOSIS — Z0130 Encounter for examination of blood pressure without abnormal findings: Secondary | ICD-10-CM | POA: Diagnosis not present

## 2022-09-14 DIAGNOSIS — H7290 Unspecified perforation of tympanic membrane, unspecified ear: Secondary | ICD-10-CM | POA: Diagnosis not present

## 2022-09-14 DIAGNOSIS — H6123 Impacted cerumen, bilateral: Secondary | ICD-10-CM | POA: Diagnosis not present

## 2022-09-14 DIAGNOSIS — Z974 Presence of external hearing-aid: Secondary | ICD-10-CM | POA: Diagnosis not present

## 2022-09-14 DIAGNOSIS — Z6824 Body mass index (BMI) 24.0-24.9, adult: Secondary | ICD-10-CM | POA: Diagnosis not present

## 2022-09-18 ENCOUNTER — Ambulatory Visit: Payer: Medicare HMO

## 2022-09-19 ENCOUNTER — Encounter: Payer: Medicare HMO | Admitting: Family

## 2022-09-19 DIAGNOSIS — M810 Age-related osteoporosis without current pathological fracture: Secondary | ICD-10-CM

## 2022-09-19 MED ORDER — DENOSUMAB 60 MG/ML ~~LOC~~ SOSY
60.0000 mg | PREFILLED_SYRINGE | Freq: Once | SUBCUTANEOUS | Status: AC
  Administered 2022-09-19: 60 mg via SUBCUTANEOUS

## 2022-09-25 NOTE — Progress Notes (Signed)
Prolia injection administered by CMA

## 2022-09-28 ENCOUNTER — Non-Acute Institutional Stay: Payer: Medicare HMO | Admitting: Adult Health

## 2022-09-28 ENCOUNTER — Encounter: Payer: Self-pay | Admitting: Adult Health

## 2022-09-28 DIAGNOSIS — R040 Epistaxis: Secondary | ICD-10-CM | POA: Diagnosis not present

## 2022-09-28 DIAGNOSIS — I10 Essential (primary) hypertension: Secondary | ICD-10-CM

## 2022-09-28 MED ORDER — OXYMETAZOLINE HCL 0.05 % NA SOLN
2.0000 | Freq: Every day | NASAL | 0 refills | Status: DC | PRN
Start: 2022-09-28 — End: 2023-01-09

## 2022-09-28 MED ORDER — SALINE NA GEL
1.0000 | Freq: Every day | NASAL | 0 refills | Status: DC
Start: 2022-09-28 — End: 2022-11-07

## 2022-09-28 NOTE — Progress Notes (Signed)
Location:  [facility] illustr[facility] of Service:  ALF (13) Provider:  Tally Joe    Patient Care Team: Mahlon Gammon, MD as PCP - General (Internal Medicine) Bernette Redbird, MD as Consulting Physician (Gastroenterology) Dannielle Huh, MD as Consulting Physician (Orthopedic Surgery) Ellen Henri as Consulting Physician (Dermatology) Cassell Clement, MD as Consulting Physician (Cardiology) Maris Berger, MD as Consulting Physician (Ophthalmology) Glennis Brink, MD as Referring Physician (Dermatology)  Extended Emergency Contact Information Primary Emergency Contact: Berton Mount of [place] Home Phone: [phone] Relation: Daughter Secondary Emergency Contact: [facility] Address: [phone] [place] [address] Darden Amber of [place] Home Phone: [phone] Mobile Phone: [phone] Relation: Son  Code Status:  DNR Goals of care: Advanced Directive information    09/28/2022   10:51 AM  Advanced Directives  Does Patient Have a Medical Advance Directive? Yes  Type of Estate agent of [place];Living will;Out of facility DNR (pink MOST or yellow form)  Does patient want to make changes to medical advance directive? No - Patient declined  Copy of Healthcare Power of Attorney in Chart? Yes - validated most recent copy scanned in chart (See row information)  Pre-existing out of facility DNR order (yellow form or pink MOST form) Pink MOST/Yellow Form most recent copy in chart - Physician notified to receive inpatient order     Chief Complaint  Patient presents with   Acute Visit    Nose bleed and HTN    HPI:  Pt is a 89 y.o. female seen today for an acute visit for nose bleeding and HTN  Nurse reports that Ms. Gomm had three nose bleeds in the past two days. Last evening 09/27/22 this occurred to the left nare and pressure was applied for 8 minutes and it stopped. During the incident  her BP was 174/89.  Now her BP is 141/81.  Readings reviewed in matrix and do not show significant elevation trends (140-150/80-90). She is not having any nasal congestion, drainage, cough , etc. Was treated for TM perforation with Augmentin mid April 2024. Her daughter is in the room and is getting a humidifier. Pt reports she was picking her nose before the bleed one day ago.     Past Medical History:  Diagnosis Date   Arthritis    "all over"   BACK PAIN 03/08/2007   Cancer (HCC)    squamous cell on R leg & face- ?basal cell   Cutaneous abscess of right lower limb    DEPRESSIVE DISORDER 12/01/2008   Disruption of external operation (surgical) wound, not elsewhere classified, initial encounter    Duodenal ulcer    Essential (primary) hypertension    GI bleed 09/12/2010   Naproxen induced Endoscopy with dr Marjorie Smolder    HIATAL HERNIA 12/17/2006   History of hiatal hernia    HYPERLIPIDEMIA 03/09/2006   Hyperlipidemia, unspecified    HYPERTENSION 03/09/2006   KNEE PAIN 05/06/2009   Macular degeneration    Major depressive disorder, single episode, unspecified    Pneumonia    51yrs. ago- hosp. pneumonia   Urinary urgency    Past Surgical History:  Procedure Laterality Date   ABDOMINAL HYSTERECTOMY  1975   APPLICATION OF A-CELL OF EXTREMITY Right 12/10/2014   Procedure: APPLICATION OF A-CELL OF EXTREMITY;  Surgeon: Wayland Denis, DO;  Location: [facility];  Service: Plastics;  Laterality: Right;   arthroscopic knee Right    x 2   BACK  SURGERY  2000   spinal fusion   BILATERAL SALPINGECTOMY Bilateral 1989   Dr. Gavin Potters   CARDIAC CATHETERIZATION     EYE SURGERY     cataracts bilateral - removed   HERNIA REPAIR Bilateral    inguinal    I & D EXTREMITY Right 12/10/2014   Procedure: IRRIGATION AND DEBRIDEMENT ON RIGHT LEG, ;  Surgeon: Wayland Denis, DO;  Location: [facility];  Service: Plastics;  Laterality: Right;   I & D EXTREMITY Right 01/20/2015   Procedure: IRRIGATION AND DEBRIDEMENT RIGHT  LOWER LEG WOUND, with ACELL to Donor Site, SKIN GRAFT AND VAC Placement;  Surgeon: Wayland Denis, DO;  Location: [facility];  Service: Plastics;  Laterality: Right;   LUMBAR LAMINECTOMY  02-17-1999   with sinal fusion L3,4,5,&S1   moes     moses surgery on R lle   OOPHORECTOMY  1989   RESECTION DISTAL CLAVICAL     ROTATOR CUFF REPAIR Right 2001   x2   SKIN GRAFT Right 05/29/2018   THYROIDECTOMY     TONSILLECTOMY  1928   TUBAL LIGATION      Allergies  Allergen Reactions   Naproxen     Gi bleed   Nsaids     GI BLEED   Aspirin     GI bleed   Ace Inhibitors     Cough    Caffeine Other (See Comments)    Causes joints to be painful    Outpatient Encounter Medications as of 09/28/2022  Medication Sig   acetaminophen (TYLENOL) 325 MG tablet Take 325 mg by mouth once. Take 650 mg , oral , once a morning   acetaminophen (TYLENOL) 500 MG tablet Take 1,000 mg by mouth in the morning and at bedtime.   Ascorbic Acid (VITAMIN C) 1000 MG tablet Take 1,000 mg by mouth daily.   Carboxymethylcellulose Sodium (THERATEARS) 0.25 % SOLN Apply 2-3 drops to eye in the morning, at noon, in the evening, and at bedtime.   Cholecalciferol (VITAMIN D3) 50 MCG (2000 UT) TABS Take 1 tablet by mouth daily.   DULoxetine (CYMBALTA) 20 MG capsule Take 1 capsule (20 mg total) by mouth daily.   losartan (COZAAR) 50 MG tablet Take 1.5 tablets (75 mg total) by mouth daily.   Multiple Vitamins-Minerals (PRESERVISION AREDS 2+MULTI VIT PO) Take by mouth 2 (two) times daily.   Omega-3 1000 MG CAPS Take one capsule by mouth three times daily.   polyethylene glycol powder (GLYCOLAX/MIRALAX) 17 GM/SCOOP powder Take 17 g by mouth daily as needed.   Pregnenolone 50 MG TABS Take 1 tablet by mouth every morning.   sennosides-docusate sodium (SENOKOT-S) 8.6-50 MG tablet Take 2 tablets by mouth daily.   SPIKEVAX syringe Inject 0.5 mLs into the muscle once. (Patient not taking: Reported on 09/28/2022)   No facility-administered  encounter medications on file as of 09/28/2022.    Review of Systems  Constitutional:  Negative for activity change, appetite change, chills, diaphoresis, fatigue, fever and unexpected weight change.  HENT:  Positive for nosebleeds. Negative for congestion, ear discharge, ear pain, facial swelling, postnasal drip, rhinorrhea, sinus pressure, sinus pain, sneezing, sore throat and trouble swallowing.   Respiratory:  Negative for cough, shortness of breath and wheezing.   Cardiovascular:  Negative for chest pain, palpitations and leg swelling.  Gastrointestinal:  Negative for abdominal distention, abdominal pain, constipation and diarrhea.  Genitourinary:  Negative for difficulty urinating and dysuria.  Musculoskeletal:  Positive for arthralgias and gait problem. Negative for back pain, joint  swelling and myalgias.  Neurological:  Negative for dizziness, tremors, seizures, syncope, facial asymmetry, speech difficulty, weakness, light-headedness, numbness and headaches.  Psychiatric/Behavioral:  Negative for agitation, behavioral problems and confusion.        Memory loss word finding issues    Immunization History  Administered Date(s) Administered   Fluad Quad(high Dose 65+) 04/05/2020   Influenza Split 03/08/2011, 03/05/2012   Influenza Whole 03/08/2007, 03/02/2009, 03/29/2010   Influenza, High Dose Seasonal PF 03/21/2013, 06/13/2017, 03/07/2019, 03/26/2020   Influenza,inj,Quad PF,6+ Mos 02/20/2014, 02/11/2015, 03/22/2018   Influenza-Unspecified 03/23/2016, 04/05/2020, 03/03/2022   Moderna Covid-19 Vaccine Bivalent Booster 10yrs & up 03/10/2021   Moderna SARS-COV2 Booster Vaccination 09/20/2020   Moderna Sars-Covid-2 Vaccination 06/10/2019, 07/09/2019, 10/13/2021, 04/03/2022   PFIZER(Purple Top)SARS-COV-2 Vaccination 04/05/2020   Pneumococcal Conjugate-13 07/30/2014   Pneumococcal Polysaccharide-23 02/03/2002, 12/16/2009   Td 02/03/2002   Tdap 12/11/2012   Zoster Recombinat (Shingrix)  05/10/2022, 07/11/2022   Pertinent  Health Maintenance Due  Topic Date Due   INFLUENZA VACCINE  12/28/2022   DEXA SCAN  Completed      02/17/2022   10:08 AM 05/08/2022    1:48 PM 05/18/2022   11:47 AM 05/19/2022   10:07 AM 08/08/2022    1:26 PM  Fall Risk  Falls in the past year?  1  1 1   Was there an injury with Fall?  0  0 1  Fall Risk Category Calculator  2  2 2   Fall Risk Category (Retired)  Moderate  Moderate   (RETIRED) Patient Fall Risk Level High fall risk High fall risk High fall risk High fall risk   Patient at Risk for Falls Due to  History of fall(s);Impaired balance/gait;Impaired mobility  Impaired balance/gait;Impaired mobility History of fall(s)  Fall risk Follow up  Falls evaluation completed  Falls evaluation completed Falls evaluation completed   Functional Status Survey:    Vitals:   09/28/22 1039  BP: (!) 141/81  Pulse: 80  Resp: 18  Temp: (!) 97 F (36.1 C)  SpO2: 95%  Weight: 124 lb 9.6 oz (56.5 kg)  Height: 5' (1.524 m)   Body mass index is 24.33 kg/m. Physical Exam Vitals and nursing note reviewed.  Constitutional:      General: She is not in acute distress.    Appearance: She is not diaphoretic.  HENT:     Head: Normocephalic and atraumatic.     Nose: No signs of injury, nasal tenderness, congestion or rhinorrhea.     Right Nostril: No foreign body, epistaxis, septal hematoma or occlusion.     Left Nostril: No foreign body, epistaxis, septal hematoma or occlusion.     Right Turbinates: Swollen.     Left Turbinates: Swollen.     Comments: Mild erythema no purulent matter no bleeding no ulcer    Mouth/Throat:     Mouth: Mucous membranes are moist.     Pharynx: Oropharynx is clear. No oropharyngeal exudate or posterior oropharyngeal erythema.  Neck:     Vascular: No JVD.  Cardiovascular:     Rate and Rhythm: Normal rate and regular rhythm.     Heart sounds: No murmur heard. Pulmonary:     Effort: Pulmonary effort is normal. No  respiratory distress.     Breath sounds: Normal breath sounds. No wheezing.  Skin:    General: Skin is warm and dry.  Neurological:     Mental Status: She is alert and oriented to person, place, and time.     Labs reviewed: Recent Labs  02/17/22 1010 03/03/22 0000 04/18/22 1345 08/08/22 0000  NA 141 141 143 138  K 3.6 4.1 4.4 4.0  CL 109 102 103 101  CO2 26 27* 25* 26*  GLUCOSE 95  --   --   --   BUN 19 18 14 21   CREATININE 0.49 0.7 0.6 0.6  CALCIUM 8.6* 9.4 10.2 9.4   Recent Labs    03/03/22 0000  AST 20  ALT 14  ALKPHOS 52  ALBUMIN 4.6   Recent Labs    02/17/22 1010 04/18/22 1345 08/08/22 0000  WBC 5.2 6.1 4.9  HGB 11.4* 12.9 11.5*  HCT 35.3* 41 35*  MCV 93.6  --   --   PLT 153 291 157   Lab Results  Component Value Date   TSH 1.39 01/02/2022   No results found for: "HGBA1C" Lab Results  Component Value Date   CHOL 238 (A) 08/20/2018   HDL 67 08/20/2018   LDLCALC 154 08/20/2018   LDLDIRECT 118.7 07/20/2011   TRIG 85 08/20/2018   CHOLHDL 4 12/30/2013    Significant Diagnostic Results in last 30 days:  No results found.  Assessment/Plan  1. Epistaxis  - Saline GEL; Place 1 Application into the nose daily.  Dispense: 30 g; Refill: 0 - oxymetazoline (AFRIN NASAL SPRAY) 0.05 % nasal spray; Place 2 sprays into both nostrils daily as needed (nose bleed).  Dispense: 30 mL; Refill: 0 No ulcer noted on exam.  Avoid picking If worsening, will need ENT referral Ordered humidifier  2. Essential hypertension Numbers are fairly well controlled in review of matrix readings and the elevations tend to be under a time of stress. The nurse will be monitoring her bp bid x 1 week manually and reporting to [facility] as she is on losartan.   Family/ staff Communication: discussed with her daughter at bedside   Labs/tests ordered:  NA  Total time :  time greater than 50% of total time spent doing pt counseling and coordination of care

## 2022-10-09 ENCOUNTER — Ambulatory Visit: Payer: Medicare HMO | Admitting: Orthopedic Surgery

## 2022-11-06 DIAGNOSIS — M4606 Spinal enthesopathy, lumbar region: Secondary | ICD-10-CM | POA: Diagnosis not present

## 2022-11-06 DIAGNOSIS — H6123 Impacted cerumen, bilateral: Secondary | ICD-10-CM | POA: Diagnosis not present

## 2022-11-06 DIAGNOSIS — M47816 Spondylosis without myelopathy or radiculopathy, lumbar region: Secondary | ICD-10-CM | POA: Diagnosis not present

## 2022-11-06 DIAGNOSIS — M48061 Spinal stenosis, lumbar region without neurogenic claudication: Secondary | ICD-10-CM | POA: Diagnosis not present

## 2022-11-06 DIAGNOSIS — M533 Sacrococcygeal disorders, not elsewhere classified: Secondary | ICD-10-CM | POA: Diagnosis not present

## 2022-11-06 DIAGNOSIS — M8588 Other specified disorders of bone density and structure, other site: Secondary | ICD-10-CM | POA: Diagnosis not present

## 2022-11-06 DIAGNOSIS — H93293 Other abnormal auditory perceptions, bilateral: Secondary | ICD-10-CM | POA: Diagnosis not present

## 2022-11-06 DIAGNOSIS — H9212 Otorrhea, left ear: Secondary | ICD-10-CM | POA: Diagnosis not present

## 2022-11-07 ENCOUNTER — Non-Acute Institutional Stay: Payer: Medicare HMO | Admitting: Orthopedic Surgery

## 2022-11-07 ENCOUNTER — Telehealth: Payer: Self-pay | Admitting: Nurse Practitioner

## 2022-11-07 ENCOUNTER — Encounter: Payer: Self-pay | Admitting: Orthopedic Surgery

## 2022-11-07 DIAGNOSIS — I10 Essential (primary) hypertension: Secondary | ICD-10-CM | POA: Diagnosis not present

## 2022-11-07 DIAGNOSIS — R0781 Pleurodynia: Secondary | ICD-10-CM

## 2022-11-07 DIAGNOSIS — I517 Cardiomegaly: Secondary | ICD-10-CM | POA: Diagnosis not present

## 2022-11-07 DIAGNOSIS — M545 Low back pain, unspecified: Secondary | ICD-10-CM | POA: Diagnosis not present

## 2022-11-07 DIAGNOSIS — R0789 Other chest pain: Secondary | ICD-10-CM | POA: Diagnosis not present

## 2022-11-07 DIAGNOSIS — R296 Repeated falls: Secondary | ICD-10-CM | POA: Diagnosis not present

## 2022-11-07 NOTE — Telephone Encounter (Signed)
Called last night for severe back pain, one time Norco 5/325mg  administered, X-ray lumbar spine and sacrum ordered. The pain was new, positional, but denied trauma to injury. HPOA desire to observe the patient and control her pain.

## 2022-11-07 NOTE — Progress Notes (Signed)
[facility] Room Number: 639-A Place of Service:  ALF [phone]) Provider:  Hazle Nordmann, NP  PCP: Mahlon Gammon, MD  Patient Care Team: Mahlon Gammon, MD as PCP - General (Internal Medicine) Bernette Redbird, MD as Consulting Physician (Gastroenterology) Dannielle Huh, MD as Consulting Physician (Orthopedic Surgery) Ellen Henri as Consulting Physician (Dermatology) Cassell Clement, MD as Consulting Physician (Cardiology) Maris Berger, MD as Consulting Physician (Ophthalmology) Glennis Brink, MD as Referring Physician (Dermatology)  Extended Emergency Contact Information Primary Emergency Contact: Berton Mount of [place] Home Phone: [phone] Relation: Daughter Secondary Emergency Contact: [facility] Address: [phone] [place] [address] D[place] of [place] Home Phone: [phone] Mobile Phone: [phone] Relation: Son  Code Status:  DNR Goals of care: Advanced Directive information    11/07/2022    9:24 AM  Advanced Directives  Does Patient Have a Medical Advance Directive? Yes  Type of Estate agent of [place];Living will;Out of facility DNR (pink MOST or yellow form)  Does patient want to make changes to medical advance directive? No - Patient declined  Copy of Healthcare Power of Attorney in Chart? Yes - validated most recent copy scanned in chart (See row information)     Chief Complaint  Patient presents with   Acute Visit    Back pain.    HPI:  Pt is a 89 y.o. female seen today for an acute visit due to increased back pain.   She currently resides on the assisted living unit at [facility]. PMH: HTN, HLD, LBBB, hiatal hernia, constipation, cognitive impairment, OA, osteoporosis, and depression.   06/10 she went to see ENT, when she returned to AL, she had increased lower back pain. No reports of fall or injury. H/o recurrent falls within past  few months, plan to move to SNF soon. On call provider ordered Xrays of spine. She was also given one time dose of norco. Today she reports pain to middle/ right back. Pain increased with movement and inspiration. Xray lumbar spine/sacrum negative for acute fracture, lumbar lordosis with no subluxation present. Treatment options discussed with HPOA. Plan to order CXR to r/o fib fracture. Will start lidocaine patches to back to help reduce pain.   Blood pressure > 150/90. She is taking losartan daily. BUN/creat 21/0.6 08/08/2022.   Past Surgical History:  Procedure Laterality Date   ABDOMINAL HYSTERECTOMY  1975   APPLICATION OF A-CELL OF EXTREMITY Right 12/10/2014   Procedure: APPLICATION OF A-CELL OF EXTREMITY;  Surgeon: Wayland Denis, DO;  Location: [facility] OR;  Service: Plastics;  Laterality: Right;   arthroscopic knee Right    x 2   BACK SURGERY  2000   spinal fusion   BILATERAL SALPINGECTOMY Bilateral 1989   Dr. Gavin Potters   CARDIAC CATHETERIZATION     EYE SURGERY     cataracts bilateral - removed   HERNIA REPAIR Bilateral    inguinal    I & D EXTREMITY Right 12/10/2014   Procedure: IRRIGATION AND DEBRIDEMENT ON RIGHT LEG, ;  Surgeon: Wayland Denis, DO;  Location: [facility] OR;  Service: Plastics;  Laterality: Right;   I & D EXTREMITY Right 01/20/2015   Procedure: IRRIGATION AND DEBRIDEMENT RIGHT LOWER LEG WOUND, with ACELL to Donor Site, SKIN GRAFT AND VAC Placement;  Surgeon: Wayland Denis, DO;  Location: [facility] OR;  Service: Plastics;  Laterality: Right;   LUMBAR LAMINECTOMY  02-17-1999   with sinal fusion  L3,4,5,&S1   moes     moses surgery on R lle   OOPHORECTOMY  1989   RESECTION DISTAL CLAVICAL     ROTATOR CUFF REPAIR Right 2001   x2   SKIN GRAFT Right 05/29/2018   THYROIDECTOMY     TONSILLECTOMY  1928   TUBAL LIGATION      Allergies  Allergen Reactions   Naproxen     Gi bleed   Nsaids     GI BLEED   Aspirin     GI bleed   Ace Inhibitors     Cough    Caffeine Other (See Comments)     Causes joints to be painful    Allergies as of 11/07/2022       Reactions   Naproxen    Gi bleed   Nsaids    GI BLEED   Aspirin    GI bleed   Ace Inhibitors    Cough   Caffeine Other (See Comments)   Causes joints to be painful        Medication List        Accurate as of November 07, 2022  9:24 AM. If you have any questions, ask your nurse or doctor.          STOP taking these medications    Saline Gel Stopped by: Octavia Heir, NP       TAKE these medications    acetaminophen 500 MG tablet Commonly known as: TYLENOL Take 1,000 mg by mouth in the morning and at bedtime.   acetaminophen 325 MG tablet Commonly known as: TYLENOL Take 325 mg by mouth once. Take 650 mg , oral , once a morning   ciprofloxacin-dexamethasone OTIC suspension Commonly known as: CIPRODEX Place 4 drops into the left ear 2 (two) times daily.   DULoxetine 20 MG capsule Commonly known as: CYMBALTA Take 1 capsule (20 mg total) by mouth daily.   losartan 50 MG tablet Commonly known as: COZAAR Take 1.5 tablets (75 mg total) by mouth daily.   Omega-3 1000 MG Caps Take 2,000 mg by mouth every morning. Take one capsule by mouth three times daily.   oxymetazoline 0.05 % nasal spray Commonly known as: Afrin Nasal Spray Place 2 sprays into both nostrils daily as needed (nose bleed).   polyethylene glycol powder 17 GM/SCOOP powder Commonly known as: GLYCOLAX/MIRALAX Take 17 g by mouth daily as needed.   Pregnenolone 50 MG Tabs Take 1 tablet by mouth every morning.   PRESERVISION AREDS 2+MULTI VIT PO Take by mouth 2 (two) times daily.   sennosides-docusate sodium 8.6-50 MG tablet Commonly known as: SENOKOT-S Take 2 tablets by mouth daily.   Theratears 0.25 % Soln Generic drug: Carboxymethylcellulose Sodium Apply 2-3 drops to eye in the morning, at noon, in the evening, and at bedtime.   vitamin C 1000 MG tablet Take 1,000 mg by mouth daily.   Vitamin D3 50 MCG (2000 UT)  Tabs Take 1 tablet by mouth daily.        Review of Systems  Constitutional:  Negative for activity change and appetite change.  Eyes:  Negative for visual disturbance.  Respiratory:  Negative for cough, shortness of breath and wheezing.   Cardiovascular:  Positive for chest pain. Negative for leg swelling.  Gastrointestinal:  Negative for abdominal distention and abdominal pain.  Genitourinary:  Negative for dysuria.  Musculoskeletal:  Positive for back pain and gait problem.  Skin:  Negative for wound.  Neurological:  Positive for weakness.  Negative for dizziness and headaches.  Psychiatric/Behavioral:  Positive for confusion. Negative for dysphoric mood. The patient is not nervous/anxious.     Immunization History  Administered Date(s) Administered   Fluad Quad(high Dose 65+) 04/05/2020   Influenza Split 03/08/2011, 03/05/2012   Influenza Whole 03/08/2007, 03/02/2009, 03/29/2010   Influenza, High Dose Seasonal PF 03/21/2013, 06/13/2017, 03/07/2019, 03/26/2020   Influenza,inj,Quad PF,6+ Mos 02/20/2014, 02/11/2015, 03/22/2018   Influenza-Unspecified 03/23/2016, 04/05/2020, 03/03/2022   Moderna Covid-19 Vaccine Bivalent Booster 48yrs & up 03/10/2021   Moderna SARS-COV2 Booster Vaccination 09/20/2020   Moderna Sars-Covid-2 Vaccination 06/10/2019, 07/09/2019, 10/13/2021, 04/03/2022   PFIZER(Purple Top)SARS-COV-2 Vaccination 04/05/2020   Pneumococcal Conjugate-13 07/30/2014   Pneumococcal Polysaccharide-23 02/03/2002, 12/16/2009   Td 02/03/2002   Tdap 12/11/2012   Zoster Recombinat (Shingrix) 05/10/2022, 07/11/2022   Pertinent  Health Maintenance Due  Topic Date Due   INFLUENZA VACCINE  12/28/2022   DEXA SCAN  Completed      02/17/2022   10:08 AM 05/08/2022    1:48 PM 05/18/2022   11:47 AM 05/19/2022   10:07 AM 08/08/2022    1:26 PM  Fall Risk  Falls in the past year?  1  1 1   Was there an injury with Fall?  0  0 1  Fall Risk Category Calculator  2  2 2   Fall Risk  Category (Retired)  Moderate  Moderate   (RETIRED) Patient Fall Risk Level High fall risk High fall risk High fall risk High fall risk   Patient at Risk for Falls Due to  History of fall(s);Impaired balance/gait;Impaired mobility  Impaired balance/gait;Impaired mobility History of fall(s)  Fall risk Follow up  Falls evaluation completed  Falls evaluation completed Falls evaluation completed   Functional Status Survey:    Vitals:   11/07/22 0916  BP: (!) 169/85  Pulse: 77  Resp: 18  Temp: (!) 96.8 F (36 C)  SpO2: 97%  Weight: 122 lb (55.3 kg)  Height: 5' (1.524 m)   Body mass index is 23.83 kg/m. Physical Exam Vitals reviewed.  Constitutional:      General: She is not in acute distress. HENT:     Head: Normocephalic.  Eyes:     General:        Right eye: No discharge.        Left eye: No discharge.  Cardiovascular:     Rate and Rhythm: Normal rate and regular rhythm.     Pulses: Normal pulses.     Heart sounds: Normal heart sounds.  Pulmonary:     Effort: Pulmonary effort is normal.     Breath sounds: Normal breath sounds.  Chest:     Chest wall: Tenderness present. No mass, deformity or swelling.  Abdominal:     General: Bowel sounds are normal. There is no distension.     Palpations: Abdomen is soft.     Tenderness: There is no abdominal tenderness.  Musculoskeletal:     Cervical back: Normal and neck supple.     Thoracic back: Normal. No tenderness.     Lumbar back: No tenderness.     Right lower leg: No edema.     Left lower leg: No edema.     Comments: Left knee brace, no visible deformity or bruising, tenderness between shoulder blades> more right than left  Skin:    General: Skin is warm.     Capillary Refill: Capillary refill takes less than 2 seconds.  Neurological:     General: No focal deficit present.  Mental Status: She is alert. Mental status is at baseline.     Motor: Weakness present.     Gait: Gait abnormal.  Psychiatric:        Mood and  Affect: Mood normal.     Labs reviewed: Recent Labs    02/17/22 1010 03/03/22 0000 04/18/22 1345 08/08/22 0000  NA 141 141 143 138  K 3.6 4.1 4.4 4.0  CL 109 102 103 101  CO2 26 27* 25* 26*  GLUCOSE 95  --   --   --   BUN 19 18 14 21   CREATININE 0.49 0.7 0.6 0.6  CALCIUM 8.6* 9.4 10.2 9.4   Recent Labs    03/03/22 0000  AST 20  ALT 14  ALKPHOS 52  ALBUMIN 4.6   Recent Labs    02/17/22 1010 04/18/22 1345 08/08/22 0000  WBC 5.2 6.1 4.9  HGB 11.4* 12.9 11.5*  HCT 35.3* 41 35*  MCV 93.6  --   --   PLT 153 291 157   Lab Results  Component Value Date   TSH 1.39 01/02/2022   No results found for: "HGBA1C" Lab Results  Component Value Date   CHOL 238 (A) 08/20/2018   HDL 67 08/20/2018   LDLCALC 154 08/20/2018   LDLDIRECT 118.7 07/20/2011   TRIG 85 08/20/2018   CHOLHDL 4 12/30/2013    Significant Diagnostic Results in last 30 days:  No results found.  Assessment/Plan 1. Rib pain - onset 06/10> no reported fall or injury - pain with inspiration, tenderness between shoulder blades - xray lumbar/sacrum unremarkable - CXR to r/o rib fracture - start lidocaine patches> apply 2 between shoulder blades x 14 days, ok to remove at night - do not recommend narcotic pain control due to falls history/ advanced age - cont tylenol for pain  2. Acute midline low back pain without sciatica - see above  3. Recurrent falls - plans to move to SNF in near future per [facility] nurse  4. Essential hypertension - uncontrolled, goal < 150/90 - suspect elevation due to pain - recommend bp BID x 7 days - start hydralazine prn for SBP > 180    Family/ staff Communication: plan discussed with patient and nurse  Labs/tests ordered:  CXR

## 2022-11-08 ENCOUNTER — Encounter: Payer: Self-pay | Admitting: Internal Medicine

## 2022-11-08 ENCOUNTER — Other Ambulatory Visit: Payer: Self-pay | Admitting: Orthopedic Surgery

## 2022-11-08 ENCOUNTER — Non-Acute Institutional Stay (SKILLED_NURSING_FACILITY): Payer: Medicare HMO | Admitting: Internal Medicine

## 2022-11-08 DIAGNOSIS — M545 Low back pain, unspecified: Secondary | ICD-10-CM | POA: Diagnosis not present

## 2022-11-08 DIAGNOSIS — M1712 Unilateral primary osteoarthritis, left knee: Secondary | ICD-10-CM | POA: Diagnosis not present

## 2022-11-08 DIAGNOSIS — R0781 Pleurodynia: Secondary | ICD-10-CM | POA: Diagnosis not present

## 2022-11-08 DIAGNOSIS — F325 Major depressive disorder, single episode, in full remission: Secondary | ICD-10-CM

## 2022-11-08 DIAGNOSIS — I10 Essential (primary) hypertension: Secondary | ICD-10-CM | POA: Diagnosis not present

## 2022-11-08 DIAGNOSIS — G3184 Mild cognitive impairment, so stated: Secondary | ICD-10-CM | POA: Diagnosis not present

## 2022-11-08 MED ORDER — HYDROCODONE-ACETAMINOPHEN 5-325 MG PO TABS
1.0000 | ORAL_TABLET | Freq: Four times a day (QID) | ORAL | 0 refills | Status: DC | PRN
Start: 2022-11-08 — End: 2022-11-13

## 2022-11-08 NOTE — Progress Notes (Signed)
Lidocaine patches unsuccessful at relieving back pain. CXR no acute fracture. She is being transferred to rehab unit at [facility]. Will start norco for pain.

## 2022-11-08 NOTE — Progress Notes (Signed)
Provider:  Einar Crow, MD Location:   [facility]  Nursing Home Room Number: 154-A Place of Service:  SNF ([phone])  PCP: Mahlon Gammon, MD Patient Care Team: Mahlon Gammon, MD as PCP - General (Internal Medicine) Bernette Redbird, MD as Consulting Physician (Gastroenterology) Dannielle Huh, MD as Consulting Physician (Orthopedic Surgery) Ellen Henri as Consulting Physician (Dermatology) Cassell Clement, MD as Consulting Physician (Cardiology) Maris Berger, MD as Consulting Physician (Ophthalmology) Glennis Brink, MD as Referring Physician (Dermatology)  Extended Emergency Contact Information Primary Emergency Contact: Berton Mount of [place] Home Phone: [phone] Relation: Daughter Secondary Emergency Contact: [facility] Address: [phone] [place] [address] Darden Amber of [place] Home Phone: [phone] Mobile Phone: [phone] Relation: Son  Code Status: DNR Goals of Care: Advanced Directive information    11/08/2022    2:01 PM  Advanced Directives  Does Patient Have a Medical Advance Directive? Yes  Type of Estate agent of [place];Living will;Out of facility DNR (pink MOST or yellow form)  Does patient want to make changes to medical advance directive? No - Patient declined  Copy of Healthcare Power of Attorney in Chart? Yes - validated most recent copy scanned in chart (See row information)      Chief Complaint  Patient presents with   New Admit To SNF    Rehab Admit.    HPI: Patient is a 89 y.o. female seen today for admission to Rehab for Back pain and Inability to do her ADLS  She is transferred from AL in [place]  Patient has a history of osteoporosis, trochanteric bursitis, left knee osteoarthritis, depression, hypertension She has been progressively getting weaker and needing assist for her ADL   Patient started having acute back pain and rib pain which was not  responding to Tylenol.  Patient unable to get up and do her ADLs and is now admitted in rehab  Patient was very confused today which is unlike her.  But she has just received Norco.  She said that that is helping her pain.  Her pain is more on the right lower side of her chest and mid back .  Her family wants her pain to be controlled even if patient has some confusion . Patient also was seen by ENT recently for ear Cerumen impaction.  She has to go back in a couple weeks to have them reeval Past Medical History:  Diagnosis Date   Arthritis    "all over"   BACK PAIN 03/08/2007   Cancer (HCC)    squamous cell on R leg & face- ?basal cell   Cutaneous abscess of right lower limb    DEPRESSIVE DISORDER 12/01/2008   Disruption of external operation (surgical) wound, not elsewhere classified, initial encounter    Duodenal ulcer    Essential (primary) hypertension    GI bleed 09/12/2010   Naproxen induced Endoscopy with dr Marjorie Smolder    HIATAL HERNIA 12/17/2006   History of hiatal hernia    HYPERLIPIDEMIA 03/09/2006   Hyperlipidemia, unspecified    HYPERTENSION 03/09/2006   KNEE PAIN 05/06/2009   Macular degeneration    Major depressive disorder, single episode, unspecified    Pneumonia    8yrs. ago- hosp. pneumonia   Urinary urgency    Past Surgical History:  Procedure Laterality Date   ABDOMINAL HYSTERECTOMY  1975   APPLICATION OF A-CELL OF EXTREMITY Right 12/10/2014   Procedure: APPLICATION OF A-CELL OF EXTREMITY;  Surgeon: Wayland Denis, DO;  Location: [facility] OR;  Service: Plastics;  Laterality: Right;   arthroscopic knee Right    x 2   BACK SURGERY  2000   spinal fusion   BILATERAL SALPINGECTOMY Bilateral 1989   Dr. Gavin Potters   CARDIAC CATHETERIZATION     EYE SURGERY     cataracts bilateral - removed   HERNIA REPAIR Bilateral    inguinal    I & D EXTREMITY Right 12/10/2014   Procedure: IRRIGATION AND DEBRIDEMENT ON RIGHT LEG, ;  Surgeon: Wayland Denis, DO;  Location: [facility] OR;   Service: Plastics;  Laterality: Right;   I & D EXTREMITY Right 01/20/2015   Procedure: IRRIGATION AND DEBRIDEMENT RIGHT LOWER LEG WOUND, with ACELL to Donor Site, SKIN GRAFT AND VAC Placement;  Surgeon: Wayland Denis, DO;  Location: [facility] OR;  Service: Plastics;  Laterality: Right;   LUMBAR LAMINECTOMY  02-17-1999   with sinal fusion L3,4,5,&S1   moes     moses surgery on R lle   OOPHORECTOMY  1989   RESECTION DISTAL CLAVICAL     ROTATOR CUFF REPAIR Right 2001   x2   SKIN GRAFT Right 05/29/2018   THYROIDECTOMY     TONSILLECTOMY  1928   TUBAL LIGATION      reports that she has quit smoking. Her smoking use included cigarettes. She has never used smokeless tobacco. She reports current alcohol use of about 7.0 standard drinks of alcohol per week. She reports that she does not use drugs. Social History   Socioeconomic History   Marital status: Widowed    Spouse name: Not on file   Number of children: 5   Years of education: Not on file   Highest education level: Not on file  Occupational History   Not on file  Tobacco Use   Smoking status: Former    Years: 16    Types: Cigarettes   Smokeless tobacco: Never   Tobacco comments:    Quit at age 49  Vaping Use   Vaping Use: Never used  Substance and Sexual Activity   Alcohol use: Yes    Alcohol/week: 7.0 standard drinks of alcohol    Types: 7 Glasses of wine per week    Comment: wine occasionally @ dinner   Drug use: No   Sexual activity: Not on file  Other Topics Concern   Not on file  Social History Narrative   ** Merged History Encounter **       Lives at [facility] since 09/1992 Widow Never smoked Alcohol wine at night Exercise classes 3-5 times a week  POA, DNR, Living Will    Social Determinants of Health   Financial Resource Strain: Not on file  Food Insecurity: Not on file  Transportation Needs: Not on file  Physical Activity: Not on file  Stress: Not on file  Social Connections: Not on file  Intimate Partner  Violence: Not on file    Functional Status Survey:    Family History  Problem Relation Age of Onset   Cancer Mother 30   Cancer Brother     Health Maintenance  Topic Date Due   COVID-19 Vaccine (7 - 2023-24 season) 05/29/2022   Medicare Annual Wellness (AWV)  10/12/2022   DTaP/Tdap/Td (3 - Td or Tdap) 12/12/2022   INFLUENZA VACCINE  12/28/2022   Pneumonia Vaccine 66+ Years old  Completed   DEXA SCAN  Completed   Zoster Vaccines- Shingrix  Completed   HPV VACCINES  Aged Out  Allergies  Allergen Reactions   Naproxen     Gi bleed   Nsaids     GI BLEED   Aspirin     GI bleed   Ace Inhibitors     Cough    Caffeine Other (See Comments)    Causes joints to be painful    Allergies as of 11/08/2022       Reactions   Naproxen    Gi bleed   Nsaids    GI BLEED   Aspirin    GI bleed   Ace Inhibitors    Cough   Caffeine Other (See Comments)   Causes joints to be painful        Medication List        Accurate as of November 08, 2022  2:03 PM. If you have any questions, ask your nurse or doctor.          acetaminophen 500 MG tablet Commonly known as: TYLENOL Take 1,000 mg by mouth in the morning and at bedtime. What changed: Another medication with the same name was removed. Continue taking this medication, and follow the directions you see here. Changed by: Mahlon Gammon, MD   acetaminophen 500 MG tablet Commonly known as: TYLENOL Take 500 mg by mouth daily as needed. What changed: Another medication with the same name was removed. Continue taking this medication, and follow the directions you see here. Changed by: Mahlon Gammon, MD   ciprofloxacin-dexamethasone OTIC suspension Commonly known as: CIPRODEX Place 4 drops into the left ear 2 (two) times daily.   DULoxetine 20 MG capsule Commonly known as: CYMBALTA Take 1 capsule (20 mg total) by mouth daily.   hydrALAZINE 10 MG tablet Commonly known as: APRESOLINE Take 10 mg by mouth 2 (two) times  daily as needed.   HYDROcodone-acetaminophen 5-325 MG tablet Commonly known as: Norco Take 1 tablet by mouth every 6 (six) hours as needed for moderate pain. Started by: Octavia Heir, NP   lidocaine 4 % Place 2 patches onto the skin every morning. Apply 2 patches to middles and right back.   losartan 50 MG tablet Commonly known as: COZAAR Take 1.5 tablets (75 mg total) by mouth daily.   Omega-3 1000 MG Caps Take 2,000 mg by mouth 2 (two) times daily.   oxymetazoline 0.05 % nasal spray Commonly known as: Afrin Nasal Spray Place 2 sprays into both nostrils daily as needed (nose bleed).   polyethylene glycol powder 17 GM/SCOOP powder Commonly known as: GLYCOLAX/MIRALAX Take 17 g by mouth daily as needed.   Pregnenolone 50 MG Tabs Take 1 tablet by mouth every morning.   PRESERVISION AREDS 2+MULTI VIT PO Take by mouth 2 (two) times daily.   sennosides-docusate sodium 8.6-50 MG tablet Commonly known as: SENOKOT-S Take 2 tablets by mouth daily.   Theratears 0.25 % Soln Generic drug: Carboxymethylcellulose Sodium Apply 2-3 drops to eye in the morning, at noon, in the evening, and at bedtime.   vitamin C 1000 MG tablet Take 1,000 mg by mouth daily.   Vitamin D3 50 MCG (2000 UT) Tabs Take 1 tablet by mouth daily.        Review of Systems  Constitutional:  Positive for activity change. Negative for appetite change.  HENT: Negative.    Respiratory:  Negative for cough and shortness of breath.   Cardiovascular:  Negative for leg swelling.  Gastrointestinal:  Negative for constipation.  Genitourinary: Negative.   Musculoskeletal:  Positive for back pain and gait  problem. Negative for arthralgias and myalgias.  Skin: Negative.   Neurological:  Negative for dizziness and weakness.  Psychiatric/Behavioral:  Positive for confusion. Negative for dysphoric mood and sleep disturbance.     Vitals:   11/08/22 1355  BP: (!) 162/92  Pulse: 87  Resp: 16  Temp: 98.4 F (36.9  C)  SpO2: 98%  Weight: 123 lb 9.6 oz (56.1 kg)  Height: 5' (1.524 m)   Body mass index is 24.14 kg/m. Physical Exam Vitals reviewed.  Constitutional:      Appearance: Normal appearance.  HENT:     Head: Normocephalic.     Nose: Nose normal.     Mouth/Throat:     Mouth: Mucous membranes are moist.     Pharynx: Oropharynx is clear.  Eyes:     Pupils: Pupils are equal, round, and reactive to light.  Cardiovascular:     Rate and Rhythm: Normal rate and regular rhythm.     Pulses: Normal pulses.     Heart sounds: Normal heart sounds. No murmur heard. Pulmonary:     Effort: Pulmonary effort is normal.     Breath sounds: Normal breath sounds.  Abdominal:     General: Abdomen is flat. Bowel sounds are normal.     Palpations: Abdomen is soft.  Musculoskeletal:        General: No swelling.     Cervical back: Neck supple.  Skin:    General: Skin is warm.  Neurological:     General: No focal deficit present.     Mental Status: She is alert.  Psychiatric:        Mood and Affect: Mood normal.        Thought Content: Thought content normal.     Labs reviewed: Basic Metabolic Panel: Recent Labs    02/17/22 1010 03/03/22 0000 04/18/22 1345 08/08/22 0000  NA 141 141 143 138  K 3.6 4.1 4.4 4.0  CL 109 102 103 101  CO2 26 27* 25* 26*  GLUCOSE 95  --   --   --   BUN 19 18 14 21   CREATININE 0.49 0.7 0.6 0.6  CALCIUM 8.6* 9.4 10.2 9.4   Liver Function Tests: Recent Labs    03/03/22 0000  AST 20  ALT 14  ALKPHOS 52  ALBUMIN 4.6   No results for input(s): "LIPASE", "AMYLASE" in the last 8760 hours. No results for input(s): "AMMONIA" in the last 8760 hours. CBC: Recent Labs    02/17/22 1010 04/18/22 1345 08/08/22 0000  WBC 5.2 6.1 4.9  HGB 11.4* 12.9 11.5*  HCT 35.3* 41 35*  MCV 93.6  --   --   PLT 153 291 157   Cardiac Enzymes: No results for input(s): "CKTOTAL", "CKMB", "CKMBINDEX", "TROPONINI" in the last 8760 hours. BNP: Invalid input(s): "POCBNP" No  results found for: "HGBA1C" Lab Results  Component Value Date   TSH 1.39 01/02/2022   No results found for: "VITAMINB12" No results found for: "FOLATE" Lab Results  Component Value Date   IRON 57 07/17/2016   TIBC 367 07/17/2016   FERRITIN 45 07/17/2016    Imaging and Procedures obtained prior to SNF admission: CT Head Wo Contrast  Result Date: 05/18/2022 CLINICAL DATA:  Trauma EXAM: CT HEAD WITHOUT CONTRAST TECHNIQUE: Contiguous axial images were obtained from the base of the skull through the vertex without intravenous contrast. RADIATION DOSE REDUCTION: This exam was performed according to the departmental dose-optimization program which includes automated exposure control, adjustment of the mA and/or  kV according to patient size and/or use of iterative reconstruction technique. COMPARISON:  None Available. FINDINGS: Brain: No evidence of acute infarction, hemorrhage, hydrocephalus, extra-axial collection or mass lesion/mass effect. Vascular: No hyperdense vessel or unexpected calcification. Skull: Normal. Negative for fracture or focal lesion. Sinuses/Orbits: Bilateral lens replacement. Paranasal sinuses are clear. Other: None. IMPRESSION: No CT evidence of intracranial injury. No acute intracranial abnormality. Electronically Signed   By: Lorenza Cambridge M.D.   On: 05/18/2022 13:14   DG Hip Unilat  With Pelvis 2-3 Views Right  Result Date: 05/18/2022 CLINICAL DATA:  Right hip pain after fall. EXAM: DG HIP (WITH OR WITHOUT PELVIS) 2-3V RIGHT COMPARISON:  None Available. FINDINGS: There is no evidence of hip fracture or dislocation. There is no evidence of arthropathy or other focal bone abnormality. IMPRESSION: Negative. Electronically Signed   By: Lupita Raider M.D.   On: 05/18/2022 12:54    Assessment/Plan 1. Acute midline low back pain without sciatica Norco Scheduled Also Add Robaxin 250 mg PRN for additional pain  control   2. Rib pain Xray negative Pain Control for  now  3. Essential hypertension BP High will continue to monitor for now On Losartan   4. Primary osteoarthritis of left knee Tylenol  5. Mild cognitive impairment with memory loss Now more confused due to [facility] Plan for her to move to SNF  6. Major depressive disorder with single episode, in full remission (HCC) On Cymbalta    Family/ staff Communication:   Labs/tests ordered:

## 2022-11-13 ENCOUNTER — Non-Acute Institutional Stay (SKILLED_NURSING_FACILITY): Payer: Medicare HMO | Admitting: Internal Medicine

## 2022-11-13 ENCOUNTER — Encounter: Payer: Medicare HMO | Admitting: Adult Health

## 2022-11-13 ENCOUNTER — Encounter: Payer: Self-pay | Admitting: Internal Medicine

## 2022-11-13 DIAGNOSIS — I10 Essential (primary) hypertension: Secondary | ICD-10-CM | POA: Diagnosis not present

## 2022-11-13 DIAGNOSIS — M545 Low back pain, unspecified: Secondary | ICD-10-CM

## 2022-11-13 DIAGNOSIS — G3184 Mild cognitive impairment, so stated: Secondary | ICD-10-CM

## 2022-11-13 DIAGNOSIS — F325 Major depressive disorder, single episode, in full remission: Secondary | ICD-10-CM

## 2022-11-13 DIAGNOSIS — M1712 Unilateral primary osteoarthritis, left knee: Secondary | ICD-10-CM | POA: Diagnosis not present

## 2022-11-13 DIAGNOSIS — R0781 Pleurodynia: Secondary | ICD-10-CM | POA: Diagnosis not present

## 2022-11-13 MED ORDER — LORAZEPAM 0.5 MG PO TABS: 0.5000 mg | ORAL_TABLET | Freq: Four times a day (QID) | ORAL | 0 refills | Status: DC | PRN

## 2022-11-13 MED ORDER — OXYCODONE HCL 5 MG PO TABS: 5.0000 mg | ORAL_TABLET | ORAL | 0 refills | Status: DC | PRN

## 2022-11-13 NOTE — Progress Notes (Unsigned)
Location:  [facility] Room Number: NO/154/A Place of Service:  SNF [phone]) Provider: Mahlon Gammon, MD  Patient Care Team: Mahlon Gammon, MD as PCP - General (Internal Medicine) Bernette Redbird, MD as Consulting Physician (Gastroenterology) Dannielle Huh, MD as Consulting Physician (Orthopedic Surgery) Ellen Henri as Consulting Physician (Dermatology) Cassell Clement, MD as Consulting Physician (Cardiology) Maris Berger, MD as Consulting Physician (Ophthalmology) Glennis Brink, MD as Referring Physician (Dermatology)  Extended Emergency Contact Information Primary Emergency Contact: Berton Mount of [place] Home Phone: [phone] Relation: Daughter Secondary Emergency Contact: [facility] Address: [phone] [address] [place] [address] Darden Amber of [place] Home Phone: [phone] Mobile Phone: [phone] Relation: Son  Code Status:  DNR Goals of care: Advanced Directive information    11/08/2022    2:01 PM  Advanced Directives  Does Patient Have a Medical Advance Directive? Yes  Type of Estate agent of [place];Living will;Out of facility DNR (pink MOST or yellow form)  Does patient want to make changes to medical advance directive? No - Patient declined  Copy of Healthcare Power of Attorney in Chart? Yes - validated most recent copy scanned in chart (See row information)     Chief Complaint  Patient presents with   Acute Visit    Patient is being seen for pain control Elevated BP need 2nd reading for care gap metric     HPI:  Pt is a 89 y.o. female seen today for an acute visit for back pain and left-sided chest Wall pain. Patient has a history of osteoporosis, trochanteric bursitis, left knee osteoarthritis, depression, hypertension She has been progressively getting weaker and needing assist for her ADL    Patient started having acute back pain and rib pain which  was not responding to Tylenol.  Patient unable to get up and do her ADLs and is now admitted in rehab  Her x-rays done in rehab have been negative so far for any compression fracture or rib fracture She is on Norco for pain control but per her son in the room today says  it is not controlling her pain.  He had questions about whether we should do more imaging like CT or MRI .  Ms. Knowlden is having pain in mid thoracic back region and the left-sided chest wall pain She is more confused.  Having issues with word finding.  Her last MMSE was 10 out of 30.  Family is aware but they want pain management even if these medications are making her confused.  Past Medical History:  Diagnosis Date   Arthritis    "all over"   BACK PAIN 03/08/2007   Cancer (HCC)    squamous cell on R leg & face- ?basal cell   Cutaneous abscess of right lower limb    DEPRESSIVE DISORDER 12/01/2008   Disruption of external operation (surgical) wound, not elsewhere classified, initial encounter    Duodenal ulcer    Essential (primary) hypertension    GI bleed 09/12/2010   Naproxen induced Endoscopy with dr Marjorie Smolder    HIATAL HERNIA 12/17/2006   History of hiatal hernia    HYPERLIPIDEMIA 03/09/2006   Hyperlipidemia, unspecified    HYPERTENSION 03/09/2006   KNEE PAIN 05/06/2009   Macular degeneration    Major depressive disorder, single episode, unspecified    Pneumonia    57yrs. ago- hosp. pneumonia   Urinary urgency    Past Surgical History:  Procedure Laterality  Date   ABDOMINAL HYSTERECTOMY  1975   APPLICATION OF A-CELL OF EXTREMITY Right 12/10/2014   Procedure: APPLICATION OF A-CELL OF EXTREMITY;  Surgeon: Wayland Denis, DO;  Location: [facility] OR;  Service: Plastics;  Laterality: Right;   arthroscopic knee Right    x 2   BACK SURGERY  2000   spinal fusion   BILATERAL SALPINGECTOMY Bilateral 1989   Dr. Gavin Potters   CARDIAC CATHETERIZATION     EYE SURGERY     cataracts bilateral - removed   HERNIA REPAIR  Bilateral    inguinal    I & D EXTREMITY Right 12/10/2014   Procedure: IRRIGATION AND DEBRIDEMENT ON RIGHT LEG, ;  Surgeon: Wayland Denis, DO;  Location: [facility] OR;  Service: Plastics;  Laterality: Right;   I & D EXTREMITY Right 01/20/2015   Procedure: IRRIGATION AND DEBRIDEMENT RIGHT LOWER LEG WOUND, with ACELL to Donor Site, SKIN GRAFT AND VAC Placement;  Surgeon: Wayland Denis, DO;  Location: [facility] OR;  Service: Plastics;  Laterality: Right;   LUMBAR LAMINECTOMY  02-17-1999   with sinal fusion L3,4,5,&S1   moes     moses surgery on R lle   OOPHORECTOMY  1989   RESECTION DISTAL CLAVICAL     ROTATOR CUFF REPAIR Right 2001   x2   SKIN GRAFT Right 05/29/2018   THYROIDECTOMY     TONSILLECTOMY  1928   TUBAL LIGATION      Allergies  Allergen Reactions   Naproxen     Gi bleed   Nsaids     GI BLEED   Aspirin     GI bleed   Ace Inhibitors     Cough    Caffeine Other (See Comments)    Causes joints to be painful    Outpatient Encounter Medications as of 11/13/2022  Medication Sig   acetaminophen (TYLENOL) 500 MG tablet Take 1,000 mg by mouth in the morning and at bedtime.   acetaminophen (TYLENOL) 500 MG tablet Take 500 mg by mouth daily as needed.   Ascorbic Acid (VITAMIN C) 1000 MG tablet Take 1,000 mg by mouth daily.   Carboxymethylcellulose Sodium (THERATEARS) 0.25 % SOLN Apply 2-3 drops to eye in the morning, at noon, in the evening, and at bedtime.   Cholecalciferol (VITAMIN D3) 50 MCG (2000 UT) TABS Take 1 tablet by mouth daily.   ciprofloxacin-dexamethasone (CIPRODEX) OTIC suspension Place 4 drops into the left ear 2 (two) times daily.   DULoxetine (CYMBALTA) 20 MG capsule Take 1 capsule (20 mg total) by mouth daily.   hydrALAZINE (APRESOLINE) 10 MG tablet Take 10 mg by mouth 2 (two) times daily as needed.   HYDROcodone-acetaminophen (NORCO) 5-325 MG tablet Take 1 tablet by mouth every 6 (six) hours as needed for moderate pain.   lidocaine 4 % Place 2 patches onto the skin every  morning. Apply 2 patches to middles and right back.   losartan (COZAAR) 50 MG tablet Take 1.5 tablets (75 mg total) by mouth daily.   Multiple Vitamins-Minerals (PRESERVISION AREDS 2+MULTI VIT PO) Take by mouth 2 (two) times daily.   Omega-3 1000 MG CAPS Take 2,000 mg by mouth 2 (two) times daily.   oxymetazoline (AFRIN NASAL SPRAY) 0.05 % nasal spray Place 2 sprays into both nostrils daily as needed (nose bleed).   polyethylene glycol powder (GLYCOLAX/MIRALAX) 17 GM/SCOOP powder Take 17 g by mouth daily as needed.   Pregnenolone 50 MG TABS Take 1 tablet by mouth every morning.   sennosides-docusate sodium (SENOKOT-S) 8.6-50 MG tablet  Take 2 tablets by mouth daily.   No facility-administered encounter medications on file as of 11/13/2022.    Review of Systems  Constitutional:  Positive for activity change. Negative for appetite change.  HENT: Negative.    Respiratory:  Negative for cough and shortness of breath.   Cardiovascular:  Negative for leg swelling.  Gastrointestinal:  Negative for constipation.  Genitourinary: Negative.   Musculoskeletal:  Positive for arthralgias, back pain, gait problem and myalgias.  Skin: Negative.   Neurological:  Negative for dizziness and weakness.  Psychiatric/Behavioral:  Positive for confusion. Negative for dysphoric mood and sleep disturbance.     Immunization History  Administered Date(s) Administered   Fluad Quad(high Dose 65+) 04/05/2020   Influenza Split 03/08/2011, 03/05/2012   Influenza Whole 03/08/2007, 03/02/2009, 03/29/2010   Influenza, High Dose Seasonal PF 03/21/2013, 06/13/2017, 03/07/2019, 03/26/2020   Influenza,inj,Quad PF,6+ Mos 02/20/2014, 02/11/2015, 03/22/2018   Influenza-Unspecified 03/23/2016, 04/05/2020, 03/03/2022   Moderna Covid-19 Vaccine Bivalent Booster 33yrs & up 03/10/2021   Moderna SARS-COV2 Booster Vaccination 09/20/2020   Moderna Sars-Covid-2 Vaccination 06/10/2019, 07/09/2019, 10/13/2021, 04/03/2022    PFIZER(Purple Top)SARS-COV-2 Vaccination 04/05/2020   Pneumococcal Conjugate-13 07/30/2014   Pneumococcal Polysaccharide-23 02/03/2002, 12/16/2009   Td 02/03/2002   Tdap 12/11/2012   Zoster Recombinat (Shingrix) 05/10/2022, 07/11/2022   Pertinent  Health Maintenance Due  Topic Date Due   INFLUENZA VACCINE  12/28/2022   DEXA SCAN  Completed      02/17/2022   10:08 AM 05/08/2022    1:48 PM 05/18/2022   11:47 AM 05/19/2022   10:07 AM 08/08/2022    1:26 PM  Fall Risk  Falls in the past year?  1  1 1   Was there an injury with Fall?  0  0 1  Fall Risk Category Calculator  2  2 2   Fall Risk Category (Retired)  Moderate  Moderate   (RETIRED) Patient Fall Risk Level High fall risk High fall risk High fall risk High fall risk   Patient at Risk for Falls Due to  History of fall(s);Impaired balance/gait;Impaired mobility  Impaired balance/gait;Impaired mobility History of fall(s)  Fall risk Follow up  Falls evaluation completed  Falls evaluation completed Falls evaluation completed   Functional Status Survey:    Vitals:   11/13/22 1235  BP: (!) 166/73  Pulse: 77  Resp: 16  Temp: 98.1 F (36.7 C)  SpO2: 96%  Weight: 123 lb 9.6 oz (56.1 kg)  Height: 5' (1.524 m)   Body mass index is 24.14 kg/m. Physical Exam Vitals reviewed.  Constitutional:      Appearance: Normal appearance.  HENT:     Head: Normocephalic.     Nose: Nose normal.     Mouth/Throat:     Mouth: Mucous membranes are moist.     Pharynx: Oropharynx is clear.  Eyes:     Pupils: Pupils are equal, round, and reactive to light.  Cardiovascular:     Rate and Rhythm: Normal rate and regular rhythm.     Pulses: Normal pulses.     Heart sounds: Normal heart sounds. No murmur heard. Pulmonary:     Effort: Pulmonary effort is normal.     Breath sounds: Normal breath sounds.  Abdominal:     General: Abdomen is flat. Bowel sounds are normal.     Palpations: Abdomen is soft.  Musculoskeletal:        General: No  swelling.     Cervical back: Neck supple.  Skin:    General: Skin is warm.  Neurological:     General: No focal deficit present.     Mental Status: She is alert.  Psychiatric:        Mood and Affect: Mood normal.        Thought Content: Thought content normal.     Labs reviewed: Recent Labs    02/17/22 1010 03/03/22 0000 04/18/22 1345 08/08/22 0000  NA 141 141 143 138  K 3.6 4.1 4.4 4.0  CL 109 102 103 101  CO2 26 27* 25* 26*  GLUCOSE 95  --   --   --   BUN 19 18 14 21   CREATININE 0.49 0.7 0.6 0.6  CALCIUM 8.6* 9.4 10.2 9.4   Recent Labs    03/03/22 0000  AST 20  ALT 14  ALKPHOS 52  ALBUMIN 4.6   Recent Labs    02/17/22 1010 04/18/22 1345 08/08/22 0000  WBC 5.2 6.1 4.9  HGB 11.4* 12.9 11.5*  HCT 35.3* 41 35*  MCV 93.6  --   --   PLT 153 291 157   Lab Results  Component Value Date   TSH 1.39 01/02/2022   No results found for: "HGBA1C" Lab Results  Component Value Date   CHOL 238 (A) 08/20/2018   HDL 67 08/20/2018   LDLCALC 154 08/20/2018   LDLDIRECT 118.7 07/20/2011   TRIG 85 08/20/2018   CHOLHDL 4 12/30/2013    Significant Diagnostic Results in last 30 days:  No results found.  Assessment/Plan 1. Acute midline low back pain without sciatica We discussed different options including further imaging like CT or MRI.  But with her age and frailty that is not going to help with  treatment plan which is more comfort. Son agreed I will Discontinue the Norco and start her on oxycodone 5 mg every 4 hours.  Also continue Robaxin.  She can have extra oxycodone if her pain is too severe.  If this does not control her pain then we will up start her on Roxanol  2. Rib pain Oxycodone for Pain Control Xray so far negative  3. Essential hypertension Elevated due to discomfort Continue Pain Control Cozaar  4. Primary osteoarthritis of left knee Pain Control for now  5. Mild cognitive impairment with memory loss Will Move to SNF  6. Major depressive  disorder with single episode, in full remission (HCC) Cymbalta    Family/ staff Communication:   Labs/tests ordered:

## 2022-11-14 ENCOUNTER — Non-Acute Institutional Stay (SKILLED_NURSING_FACILITY): Payer: Medicare HMO | Admitting: Internal Medicine

## 2022-11-14 ENCOUNTER — Encounter: Payer: Self-pay | Admitting: Internal Medicine

## 2022-11-14 DIAGNOSIS — G3184 Mild cognitive impairment, so stated: Secondary | ICD-10-CM

## 2022-11-14 DIAGNOSIS — I10 Essential (primary) hypertension: Secondary | ICD-10-CM | POA: Diagnosis not present

## 2022-11-14 DIAGNOSIS — R0781 Pleurodynia: Secondary | ICD-10-CM | POA: Diagnosis not present

## 2022-11-14 DIAGNOSIS — M545 Low back pain, unspecified: Secondary | ICD-10-CM | POA: Diagnosis not present

## 2022-11-14 DIAGNOSIS — F325 Major depressive disorder, single episode, in full remission: Secondary | ICD-10-CM

## 2022-11-14 NOTE — Progress Notes (Signed)
Location:  [facility] Room Number: 135A Place of Service:  SNF [phone]) Provider:  Mahlon Gammon, MD   Mahlon Gammon, MD  Patient Care Team: Mahlon Gammon, MD as PCP - General (Internal Medicine) Bernette Redbird, MD as Consulting Physician (Gastroenterology) Dannielle Huh, MD as Consulting Physician (Orthopedic Surgery) Ellen Henri as Consulting Physician (Dermatology) Cassell Clement, MD as Consulting Physician (Cardiology) Maris Berger, MD as Consulting Physician (Ophthalmology) Glennis Brink, MD as Referring Physician (Dermatology)  Extended Emergency Contact Information Primary Emergency Contact: Berton Mount of [place] Home Phone: [phone] Relation: Daughter Secondary Emergency Contact: [facility] Address: [phone] [address] [place] [address] Darden Amber of [place] Home Phone: [phone] Mobile Phone: [phone] Relation: Son  Code Status:  DNR  Goals of care: Advanced Directive information    11/14/2022    3:57 PM  Advanced Directives  Does Patient Have a Medical Advance Directive? Yes  Type of Estate agent of [place];Living will;Out of facility DNR (pink MOST or yellow form)  Does patient want to make changes to medical advance directive? No - Patient declined  Copy of Healthcare Power of Attorney in Chart? Yes - validated most recent copy scanned in chart (See row information)     Chief Complaint  Patient presents with   Acute Visit    Patient is being seen for a acute visit    Health Maintenance    Discussed the need for Covid 19 vaccine and AWV    HPI:  Pt is a 89 y.o. female seen today for an acute visit for Follow up of her Back Pain And Rib Pain Admitted to SNF  Patient has a history of osteoporosis, trochanteric bursitis, left knee osteoarthritis, depression, hypertension She has been progressively getting weaker and needing assist for her ADL     Patient started having acute back pain and rib pain which was not responding to Tylenol.  Patient unable to get up and do her ADLs and is now admitted in rehab   Her x-rays done in rehab have been negative so far for any compression fracture or rib fracture Yesterday Norco Discontinued started on Oxycodone,Robaxin and Ativan Since then Pain better She is more calmer Son fine with management now Continues to get confused but family aware and want her to be comfortable Moved to her new room today   Past Medical History:  Diagnosis Date   Arthritis    "all over"   BACK PAIN 03/08/2007   Cancer (HCC)    squamous cell on R leg & face- ?basal cell   Cutaneous abscess of right lower limb    DEPRESSIVE DISORDER 12/01/2008   Disruption of external operation (surgical) wound, not elsewhere classified, initial encounter    Duodenal ulcer    Essential (primary) hypertension    GI bleed 09/12/2010   Naproxen induced Endoscopy with dr Marjorie Smolder    HIATAL HERNIA 12/17/2006   History of hiatal hernia    HYPERLIPIDEMIA 03/09/2006   Hyperlipidemia, unspecified    HYPERTENSION 03/09/2006   KNEE PAIN 05/06/2009   Macular degeneration    Major depressive disorder, single episode, unspecified    Pneumonia    38yrs. ago- hosp. pneumonia   Urinary urgency    Past Surgical History:  Procedure Laterality Date   ABDOMINAL HYSTERECTOMY  1975   APPLICATION OF A-CELL OF EXTREMITY Right 12/10/2014   Procedure: APPLICATION OF A-CELL OF EXTREMITY;  Surgeon: Wayland Denis,  DO;  Location: [facility] OR;  Service: Plastics;  Laterality: Right;   arthroscopic knee Right    x 2   BACK SURGERY  2000   spinal fusion   BILATERAL SALPINGECTOMY Bilateral 1989   Dr. Gavin Potters   CARDIAC CATHETERIZATION     EYE SURGERY     cataracts bilateral - removed   HERNIA REPAIR Bilateral    inguinal    I & D EXTREMITY Right 12/10/2014   Procedure: IRRIGATION AND DEBRIDEMENT ON RIGHT LEG, ;  Surgeon: Wayland Denis, DO;  Location:  [facility] OR;  Service: Plastics;  Laterality: Right;   I & D EXTREMITY Right 01/20/2015   Procedure: IRRIGATION AND DEBRIDEMENT RIGHT LOWER LEG WOUND, with ACELL to Donor Site, SKIN GRAFT AND VAC Placement;  Surgeon: Wayland Denis, DO;  Location: [facility] OR;  Service: Plastics;  Laterality: Right;   LUMBAR LAMINECTOMY  02-17-1999   with sinal fusion L3,4,5,&S1   moes     moses surgery on R lle   OOPHORECTOMY  1989   RESECTION DISTAL CLAVICAL     ROTATOR CUFF REPAIR Right 2001   x2   SKIN GRAFT Right 05/29/2018   THYROIDECTOMY     TONSILLECTOMY  1928   TUBAL LIGATION      Allergies  Allergen Reactions   Naproxen     Gi bleed   Nsaids     GI BLEED   Aspirin     GI bleed   Ace Inhibitors     Cough    Caffeine Other (See Comments)    Causes joints to be painful    Outpatient Encounter Medications as of 11/14/2022  Medication Sig   acetaminophen (TYLENOL) 500 MG tablet Take 1,000 mg by mouth in the morning and at bedtime.   Ascorbic Acid (VITAMIN C) 1000 MG tablet Take 1,000 mg by mouth daily.   Carboxymethylcellulose Sodium (THERATEARS) 0.25 % SOLN Apply 2-3 drops to eye in the morning, at noon, in the evening, and at bedtime.   Cholecalciferol (VITAMIN D3) 50 MCG (2000 UT) TABS Take 1 tablet by mouth daily.   DULoxetine (CYMBALTA) 20 MG capsule Take 1 capsule (20 mg total) by mouth daily.   hydrALAZINE (APRESOLINE) 10 MG tablet Take 10 mg by mouth 2 (two) times daily as needed.   lidocaine 4 % Place 2 patches onto the skin every morning. Apply 2 patches to middles and right back.   LORazepam (ATIVAN) 0.5 MG tablet Take 1 tablet (0.5 mg total) by mouth every 6 (six) hours as needed for anxiety.   losartan (COZAAR) 50 MG tablet Take 1.5 tablets (75 mg total) by mouth daily.   methocarbamol (ROBAXIN) 500 MG tablet Take 500 mg by mouth every 6 (six) hours as needed for muscle spasms.   Multiple Vitamins-Minerals (PRESERVISION AREDS 2+MULTI VIT PO) Take by mouth 2 (two) times daily.   Omega-3  1000 MG CAPS Take 2,000 mg by mouth 2 (two) times daily.   oxyCODONE (ROXICODONE) 5 MG immediate release tablet Take 1 tablet (5 mg total) by mouth every 4 (four) hours as needed for severe pain.   oxymetazoline (AFRIN NASAL SPRAY) 0.05 % nasal spray Place 2 sprays into both nostrils daily as needed (nose bleed).   polyethylene glycol powder (GLYCOLAX/MIRALAX) 17 GM/SCOOP powder Take 17 g by mouth daily as needed.   Pregnenolone 50 MG TABS Take 1 tablet by mouth every morning.   sennosides-docusate sodium (SENOKOT-S) 8.6-50 MG tablet Take 2 tablets by mouth daily.   acetaminophen (TYLENOL) 500  MG tablet Take 500 mg by mouth daily as needed. (Patient not taking: Reported on 11/14/2022)   ciprofloxacin-dexamethasone (CIPRODEX) OTIC suspension Place 4 drops into the left ear 2 (two) times daily. (Patient not taking: Reported on 11/14/2022)   No facility-administered encounter medications on file as of 11/14/2022.    Review of Systems  Constitutional:  Negative for activity change and appetite change.  HENT: Negative.    Respiratory:  Negative for cough and shortness of breath.   Cardiovascular:  Negative for leg swelling.  Gastrointestinal:  Negative for constipation.  Genitourinary: Negative.   Musculoskeletal:  Positive for back pain and gait problem. Negative for arthralgias and myalgias.  Skin: Negative.   Neurological:  Positive for weakness. Negative for dizziness.  Psychiatric/Behavioral:  Positive for confusion. Negative for dysphoric mood and sleep disturbance.     Immunization History  Administered Date(s) Administered   Covid-19, Mrna,Vaccine(Spikevax)41yrs and older 09/21/2022   Fluad Quad(high Dose 65+) 04/05/2020   Influenza Split 03/08/2011, 03/05/2012   Influenza Whole 03/08/2007, 03/02/2009, 03/29/2010   Influenza, High Dose Seasonal PF 03/21/2013, 06/13/2017, 03/07/2019, 03/26/2020   Influenza,inj,Quad PF,6+ Mos 02/20/2014, 02/11/2015, 03/22/2018   Influenza-Unspecified  03/23/2016, 04/05/2020, 03/03/2022   Moderna Covid-19 Vaccine Bivalent Booster 49yrs & up 03/10/2021   Moderna SARS-COV2 Booster Vaccination 09/20/2020   Moderna Sars-Covid-2 Vaccination 06/10/2019, 07/09/2019, 10/13/2021, 04/03/2022   PFIZER(Purple Top)SARS-COV-2 Vaccination 04/05/2020   Pneumococcal Conjugate-13 07/30/2014   Pneumococcal Polysaccharide-23 02/03/2002, 12/16/2009   Td 02/03/2002   Tdap 12/11/2012   Zoster Recombinat (Shingrix) 05/10/2022, 07/11/2022   Pertinent  Health Maintenance Due  Topic Date Due   INFLUENZA VACCINE  12/28/2022   DEXA SCAN  Completed      02/17/2022   10:08 AM 05/08/2022    1:48 PM 05/18/2022   11:47 AM 05/19/2022   10:07 AM 08/08/2022    1:26 PM  Fall Risk  Falls in the past year?  1  1 1   Was there an injury with Fall?  0  0 1  Fall Risk Category Calculator  2  2 2   Fall Risk Category (Retired)  Moderate  Moderate   (RETIRED) Patient Fall Risk Level High fall risk High fall risk High fall risk High fall risk   Patient at Risk for Falls Due to  History of fall(s);Impaired balance/gait;Impaired mobility  Impaired balance/gait;Impaired mobility History of fall(s)  Fall risk Follow up  Falls evaluation completed  Falls evaluation completed Falls evaluation completed   Functional Status Survey:    Vitals:   11/14/22 1554  BP: (!) 152/74  Pulse: 81  Resp: 16  Temp: 97.8 F (36.6 C)  TempSrc: Temporal  SpO2: 95%  Weight: 122 lb 6.4 oz (55.5 kg)  Height: 5' (1.524 m)   Body mass index is 23.9 kg/m. Physical Exam Vitals reviewed.  Constitutional:      Appearance: Normal appearance.  HENT:     Head: Normocephalic.     Nose: Nose normal.     Mouth/Throat:     Mouth: Mucous membranes are moist.     Pharynx: Oropharynx is clear.  Eyes:     Pupils: Pupils are equal, round, and reactive to light.  Cardiovascular:     Rate and Rhythm: Normal rate and regular rhythm.     Pulses: Normal pulses.     Heart sounds: Normal heart sounds.  No murmur heard. Pulmonary:     Effort: Pulmonary effort is normal.     Breath sounds: Normal breath sounds.  Abdominal:  General: Abdomen is flat. Bowel sounds are normal.     Palpations: Abdomen is soft.  Musculoskeletal:        General: No swelling.     Cervical back: Neck supple.  Skin:    General: Skin is warm.  Neurological:     General: No focal deficit present.     Mental Status: She is alert.  Psychiatric:        Mood and Affect: Mood normal.        Thought Content: Thought content normal.     Labs reviewed: Recent Labs    02/17/22 1010 03/03/22 0000 04/18/22 1345 08/08/22 0000  NA 141 141 143 138  K 3.6 4.1 4.4 4.0  CL 109 102 103 101  CO2 26 27* 25* 26*  GLUCOSE 95  --   --   --   BUN 19 18 14 21   CREATININE 0.49 0.7 0.6 0.6  CALCIUM 8.6* 9.4 10.2 9.4   Recent Labs    03/03/22 0000  AST 20  ALT 14  ALKPHOS 52  ALBUMIN 4.6   Recent Labs    02/17/22 1010 04/18/22 1345 08/08/22 0000  WBC 5.2 6.1 4.9  HGB 11.4* 12.9 11.5*  HCT 35.3* 41 35*  MCV 93.6  --   --   PLT 153 291 157   Lab Results  Component Value Date   TSH 1.39 01/02/2022   No results found for: "HGBA1C" Lab Results  Component Value Date   CHOL 238 (A) 08/20/2018   HDL 67 08/20/2018   LDLCALC 154 08/20/2018   LDLDIRECT 118.7 07/20/2011   TRIG 85 08/20/2018   CHOLHDL 4 12/30/2013    Significant Diagnostic Results in last 30 days:  No results found.  Assessment/Plan 1. Acute midline low back pain without sciatica Oxycodone and Robaxin is helping Also getting tylenol and Ativan Yesterday Note We discussed different options including further imaging like CT or MRI.  But with her age and frailty that is not going to help with  treatment plan which is more comfort. Son agreed I also d/w the daughter who is the [place] but was in [place] 2. Rib pain As above  3. Constipation  Miralax and Senna  4. Mild cognitive impairment with memory loss Does get more confused with  pain meds but will continue fo rnow  5. Essential hypertension BP better now  6. Major depressive disorder with single episode, in full remission (HCC) Cymbalta    Family/ staff Communication:   Labs/tests ordered:

## 2022-11-16 DIAGNOSIS — R2681 Unsteadiness on feet: Secondary | ICD-10-CM | POA: Diagnosis not present

## 2022-11-16 DIAGNOSIS — M6281 Muscle weakness (generalized): Secondary | ICD-10-CM | POA: Diagnosis not present

## 2022-11-16 DIAGNOSIS — R278 Other lack of coordination: Secondary | ICD-10-CM | POA: Diagnosis not present

## 2022-11-16 DIAGNOSIS — F05 Delirium due to known physiological condition: Secondary | ICD-10-CM | POA: Diagnosis not present

## 2022-11-16 DIAGNOSIS — M150 Primary generalized (osteo)arthritis: Secondary | ICD-10-CM | POA: Diagnosis not present

## 2022-11-17 DIAGNOSIS — Z9181 History of falling: Secondary | ICD-10-CM | POA: Diagnosis not present

## 2022-11-17 DIAGNOSIS — M6259 Muscle wasting and atrophy, not elsewhere classified, multiple sites: Secondary | ICD-10-CM | POA: Diagnosis not present

## 2022-11-18 ENCOUNTER — Encounter (HOSPITAL_BASED_OUTPATIENT_CLINIC_OR_DEPARTMENT_OTHER): Payer: Self-pay

## 2022-11-18 ENCOUNTER — Emergency Department (HOSPITAL_BASED_OUTPATIENT_CLINIC_OR_DEPARTMENT_OTHER)
Admission: EM | Admit: 2022-11-18 | Discharge: 2022-11-18 | Discharge status: Against Medical Advice | Payer: Medicare HMO | Attending: Emergency Medicine | Admitting: Emergency Medicine

## 2022-11-18 DIAGNOSIS — R0781 Pleurodynia: Secondary | ICD-10-CM | POA: Insufficient documentation

## 2022-11-18 DIAGNOSIS — Z5321 Procedure and treatment not carried out due to patient leaving prior to being seen by health care provider: Secondary | ICD-10-CM | POA: Diagnosis not present

## 2022-11-18 DIAGNOSIS — M546 Pain in thoracic spine: Secondary | ICD-10-CM | POA: Insufficient documentation

## 2022-11-18 NOTE — ED Triage Notes (Signed)
Her son is with her. She c/o left thoracic and ribs area pain for just over a week. She was recently at a "rehab" and pt. And her son are not sure whether pt. Has fallen or not. She is in no distress.

## 2022-11-19 ENCOUNTER — Encounter (HOSPITAL_BASED_OUTPATIENT_CLINIC_OR_DEPARTMENT_OTHER): Payer: Self-pay

## 2022-11-19 ENCOUNTER — Emergency Department (HOSPITAL_BASED_OUTPATIENT_CLINIC_OR_DEPARTMENT_OTHER)
Admission: EM | Admit: 2022-11-19 | Discharge: 2022-11-19 | Disposition: A | Discharge status: Home / Self Care | Payer: Medicare HMO | Attending: Emergency Medicine | Admitting: Emergency Medicine

## 2022-11-19 ENCOUNTER — Other Ambulatory Visit: Payer: Self-pay

## 2022-11-19 ENCOUNTER — Emergency Department (HOSPITAL_BASED_OUTPATIENT_CLINIC_OR_DEPARTMENT_OTHER): Payer: Medicare HMO

## 2022-11-19 DIAGNOSIS — I712 Thoracic aortic aneurysm, without rupture, unspecified: Secondary | ICD-10-CM | POA: Insufficient documentation

## 2022-11-19 DIAGNOSIS — M545 Low back pain, unspecified: Secondary | ICD-10-CM | POA: Diagnosis not present

## 2022-11-19 DIAGNOSIS — M47815 Spondylosis without myelopathy or radiculopathy, thoracolumbar region: Secondary | ICD-10-CM | POA: Insufficient documentation

## 2022-11-19 DIAGNOSIS — J479 Bronchiectasis, uncomplicated: Secondary | ICD-10-CM | POA: Diagnosis not present

## 2022-11-19 DIAGNOSIS — M47817 Spondylosis without myelopathy or radiculopathy, lumbosacral region: Secondary | ICD-10-CM | POA: Diagnosis not present

## 2022-11-19 NOTE — Discharge Instructions (Addendum)
Continue current medications.  Follow up with your Physician next week for recheck

## 2022-11-19 NOTE — ED Provider Notes (Signed)
[place] EMERGENCY DEPARTMENT AT [facility] Provider Note   CSN: [id number] Arrival date & time: 11/19/22  1405     History  Chief Complaint  Patient presents with   Back Pain    Vanessa Mason is a 89 y.o. female.  Patient here with her son who provides most of her history.  Patient has been having pain in her low back and in her left ribs for several weeks.  Patient has had x-rays done at the facility where she resides and they were negative.  Patient's son states that a nurse told him that it looked like she had a bone that was sticking out on the left side.  Patient is currently on pain medicine for back pain.  The history is provided by the patient. No language interpreter was used.  Back Pain Progression:  Worsening Chronicity:  New      Home Medications Prior to Admission medications   Medication Sig Start Date End Date Taking? Authorizing Provider  acetaminophen (TYLENOL) 500 MG tablet Take 1,000 mg by mouth in the morning and at bedtime.    [provider]  Ascorbic Acid (VITAMIN C) 1000 MG tablet Take 1,000 mg by mouth daily.    [provider]  Carboxymethylcellulose Sodium (THERATEARS) 0.25 % SOLN Apply 2-3 drops to eye in the morning, at noon, in the evening, and at bedtime.    [provider]  Cholecalciferol (VITAMIN D3) 50 MCG (2000 UT) TABS Take 1 tablet by mouth daily. 08/21/18   Reed, Tiffany L, DO  DULoxetine (CYMBALTA) 20 MG capsule Take 1 capsule (20 mg total) by mouth daily. 01/03/21   Mahlon Gammon, MD  hydrALAZINE (APRESOLINE) 10 MG tablet Take 10 mg by mouth 2 (two) times daily as needed.    [provider]  lidocaine 4 % Place 2 patches onto the skin every morning. Apply 2 patches to middles and right back.    [provider]  LORazepam (ATIVAN) 0.5 MG tablet Take 1 tablet (0.5 mg total) by mouth every 6 (six) hours as needed for anxiety. 11/13/22   Mahlon Gammon, MD  losartan (COZAAR)  50 MG tablet Take 1.5 tablets (75 mg total) by mouth daily. 07/04/21   Fletcher Anon, NP  methocarbamol (ROBAXIN) 500 MG tablet Take 500 mg by mouth every 6 (six) hours as needed for muscle spasms.    [provider]  Multiple Vitamins-Minerals (PRESERVISION AREDS 2+MULTI VIT PO) Take by mouth 2 (two) times daily.    [provider]  Omega-3 1000 MG CAPS Take 2,000 mg by mouth 2 (two) times daily.    [provider]  oxyCODONE (ROXICODONE) 5 MG immediate release tablet Take 1 tablet (5 mg total) by mouth every 4 (four) hours as needed for severe pain. 11/13/22   Mahlon Gammon, MD  oxymetazoline (AFRIN NASAL SPRAY) 0.05 % nasal spray Place 2 sprays into both nostrils daily as needed (nose bleed). 09/28/22   Fletcher Anon, NP  polyethylene glycol powder (GLYCOLAX/MIRALAX) 17 GM/SCOOP powder Take 17 g by mouth daily as needed. 04/26/21   Fargo, Amy E, NP  Pregnenolone 50 MG TABS Take 1 tablet by mouth every morning.    [provider]  sennosides-docusate sodium (SENOKOT-S) 8.6-50 MG tablet Take 2 tablets by mouth daily. 04/26/21   Fargo, Amy E, NP      Allergies    Naproxen, Nsaids, Aspirin, Ace inhibitors, and Caffeine    Review of Systems   Review of  Systems  Musculoskeletal:  Positive for back pain.  All other systems reviewed and are negative.   Physical Exam Updated Vital Signs BP (!) 152/89   Pulse 79   Temp 98.5 F (36.9 C)   Resp 17   SpO2 95%  Physical Exam Vitals and nursing note reviewed.  Constitutional:      Appearance: She is well-developed.  HENT:     Head: Normocephalic.  Cardiovascular:     Rate and Rhythm: Normal rate.  Pulmonary:     Effort: Pulmonary effort is normal.  Abdominal:     General: There is no distension.  Musculoskeletal:        General: Normal range of motion.     Cervical back: Normal range of motion.  Skin:    General: Skin is warm.  Neurological:     General: No focal deficit present.     Mental  Status: She is alert and oriented to person, place, and time.  Psychiatric:        Mood and Affect: Mood normal.     ED Results / Procedures / Treatments   Labs (all labs ordered are listed, but only abnormal results are displayed) Labs Reviewed - No data to display  EKG None  Radiology CT Lumbar Spine Wo Contrast  Result Date: 11/19/2022 CLINICAL DATA:  89 year old female with low back pain EXAM: CT LUMBAR SPINE WITHOUT CONTRAST TECHNIQUE: Multidetector CT imaging of the lumbar spine was performed without intravenous contrast administration. Multiplanar CT image reconstructions were also generated. RADIATION DOSE REDUCTION: This exam was performed according to the departmental dose-optimization program which includes automated exposure control, adjustment of the mA and/or kV according to patient size and/or use of iterative reconstruction technique. COMPARISON:  X-ray 12/29/2016 FINDINGS: Segmentation: 5 lumbar type vertebrae. Alignment: Grade 1 anterolisthesis of L3 on L4, L4 on L5, and L5 on S1. Mild retrolisthesis at T12-L1 and L1-L2. Vertebrae: Posterior instrumented fusion spanning L2-L5. Lumbar vertebral body heights are maintained without evidence of fracture. Bones are demineralized. No lytic or sclerotic bone lesion. Paraspinal and other soft tissues: Hiatal hernia. Aortic atherosclerosis. Colonic diverticulosis. Disc levels: Severe degenerative disc disease of multiple levels including T11-T12, T12-L1, and L1-L2. There is also severe degenerative disc disease at the T9-T10 level seen at the edge of the field of view. IMPRESSION: 1. No acute fracture or traumatic malalignment of the lumbar spine. 2. Posterior instrumented fusion spanning L2-L5. 3. Severe degenerative disc disease of multiple levels including T11-T12, T12-L1, and L1-L2. 4. Aortic atherosclerosis (ICD10-I70.0). Electronically Signed   By: Duanne Guess D.O.   On: 11/19/2022 15:35   CT Chest Wo Contrast  Result Date:  11/19/2022 CLINICAL DATA:  One-week history of left-sided back pain. No known injury. EXAM: CT CHEST WITHOUT CONTRAST TECHNIQUE: Multidetector CT imaging of the chest was performed following the standard protocol without IV contrast. RADIATION DOSE REDUCTION: This exam was performed according to the departmental dose-optimization program which includes automated exposure control, adjustment of the mA and/or kV according to patient size and/or use of iterative reconstruction technique. COMPARISON:  Chest radiograph dated 07/18/2016 FINDINGS: Cardiovascular: Normal heart size. No significant pericardial fluid/thickening. Ascending thoracic aorta measures 4.2 x 4.1 cm. Coronary artery calcifications and aortic atherosclerosis. Mediastinum/Nodes: Imaged thyroid gland without nodules meeting criteria for imaging follow-up by size. Moderate hiatal hernia. No pathologically enlarged axillary, supraclavicular, mediastinal, or hilar lymph nodes. Lungs/Pleura: The central airways are patent. Left apical linear atelectasis/scarring. Bibasilar linear scarring with mild traction bronchiectasis. No focal consolidation. No pneumothorax.  No pleural effusion. Upper abdomen: 1.0 cm cyst in the posterior hepatic segment 7 (2:108). Colonic diverticulosis without acute diverticulitis. Musculoskeletal: No acute or abnormal lytic or blastic osseous lesions. Multilevel degenerative changes of the thoracic spine. Partially imaged lumbar spinal fixation hardware. IMPRESSION: 1. No acute intrathoracic abnormality. 2. Moderate hiatal hernia. 3. Ascending thoracic aortic aneurysm measuring up to 4.2 cm. Recommend annual imaging followup by CTA or MRA. This recommendation follows 2010 ACCF/AHA/AATS/ACR/ASA/SCA/SCAI/SIR/STS/SVM Guidelines for the Diagnosis and Management of Patients with Thoracic Aortic Disease. Circulation. 2010; 121: W098-J191. Aortic aneurysm NOS (ICD10-I71.9) 4. Aortic Atherosclerosis (ICD10-I70.0). Coronary artery  calcifications. Assessment for potential risk factor modification, dietary therapy or pharmacologic therapy may be warranted, if clinically indicated. Electronically Signed   By: Agustin Cree M.D.   On: 11/19/2022 15:32    Procedures Procedures    Medications Ordered in ED Medications - No data to display  ED Course/ Medical Decision Making/ A&P                             Medical Decision Making Patient complains of low back pain and pain to the rib area of her left chest.  Patient has not had any recent falls.  She has had multiple falls in the past.  Amount and/or Complexity of Data Reviewed Independent Historian:     Details: Patient is here with her son who is supportive Radiology: ordered and independent interpretation performed. Decision-making details documented in ED Course.    Details: CT chest and LS-spine obtained CT LS spine shows significant osteoarthritis thoracic and lumbar spine.  Patient has a 4.2 cm thoracic aortic aneurysm.  Risk Risk Details: Counseled patient and her son on the results of scans.  I feel like her pain is coming from osteoarthritis.  Patient is receiving pain management at her facility.  I advised scheduling patient for follow-up with her primary care physician.  Patient will need follow-up evaluation of thoracic aneurysm.  Patient's son states they are considering palliative/hospice care for patient.  They will discuss this with the physician at [facility].           Final Clinical Impression(s) / ED Diagnoses Final diagnoses:  Osteoarthritis of thoracolumbar spine, unspecified spinal osteoarthritis complication status  Thoracic aortic aneurysm without rupture, unspecified part (HCC)    Rx / DC Orders ED Discharge Orders     None     An After Visit Summary was printed and given to the patient.     Elson Areas, PA-C 11/19/22 [address] [place], [place] 11/19/22 [id number]

## 2022-11-19 NOTE — ED Notes (Signed)
Patients son is taking pt back to [facility]. All appropriate discharge materials reviewed at length with patient. Time for questions provided. Pt has no other questions at this time and verbalizes understanding of all provided materials.

## 2022-11-19 NOTE — ED Triage Notes (Signed)
Patient here POV from [facility].  Endorses Lower and Left sided Back pain that began about a week or so ago. No Known Trauma or Injury. Was in ED yesterday but LWBS after Triage.   NAD Noted during Triage. A&Ox4. GCS 15. BIB Personal Wheelchair.

## 2022-11-20 ENCOUNTER — Non-Acute Institutional Stay (SKILLED_NURSING_FACILITY): Payer: Medicare HMO | Admitting: Internal Medicine

## 2022-11-20 ENCOUNTER — Encounter: Payer: Self-pay | Admitting: Internal Medicine

## 2022-11-20 DIAGNOSIS — G3184 Mild cognitive impairment, so stated: Secondary | ICD-10-CM

## 2022-11-20 DIAGNOSIS — F05 Delirium due to known physiological condition: Secondary | ICD-10-CM | POA: Diagnosis not present

## 2022-11-20 DIAGNOSIS — R0781 Pleurodynia: Secondary | ICD-10-CM

## 2022-11-20 DIAGNOSIS — M545 Low back pain, unspecified: Secondary | ICD-10-CM

## 2022-11-20 DIAGNOSIS — I10 Essential (primary) hypertension: Secondary | ICD-10-CM | POA: Diagnosis not present

## 2022-11-20 DIAGNOSIS — F325 Major depressive disorder, single episode, in full remission: Secondary | ICD-10-CM

## 2022-11-20 DIAGNOSIS — R278 Other lack of coordination: Secondary | ICD-10-CM | POA: Diagnosis not present

## 2022-11-20 DIAGNOSIS — R2681 Unsteadiness on feet: Secondary | ICD-10-CM | POA: Diagnosis not present

## 2022-11-20 DIAGNOSIS — R0789 Other chest pain: Secondary | ICD-10-CM

## 2022-11-20 DIAGNOSIS — M150 Primary generalized (osteo)arthritis: Secondary | ICD-10-CM | POA: Diagnosis not present

## 2022-11-20 DIAGNOSIS — M6281 Muscle weakness (generalized): Secondary | ICD-10-CM | POA: Diagnosis not present

## 2022-11-20 NOTE — Progress Notes (Unsigned)
Location:  [facility] Room Number: NO/135/A Place of Service:  SNF [phone]) Provider: Mahlon Gammon, MD  Patient Care Team: Mahlon Gammon, MD as PCP - General (Internal Medicine) Bernette Redbird, MD as Consulting Physician (Gastroenterology) Dannielle Huh, MD as Consulting Physician (Orthopedic Surgery) Ellen Henri as Consulting Physician (Dermatology) Cassell Clement, MD as Consulting Physician (Cardiology) Maris Berger, MD as Consulting Physician (Ophthalmology) Glennis Brink, MD as Referring Physician (Dermatology)  Extended Emergency Contact Information Primary Emergency Contact: Berton Mount of [place] Home Phone: [phone] Relation: Daughter Secondary Emergency Contact: [facility] Address: [phone] [place] [address] [place] of [place] Home Phone: [phone] Mobile Phone: [phone] Relation: Son  Code Status:  DNR Goals of care: Advanced Directive information    11/20/2022    1:02 PM  Advanced Directives  Does Patient Have a Medical Advance Directive? Yes  Type of Estate agent of [place];Living will;Out of facility DNR (pink MOST or yellow form)  Does patient want to make changes to medical advance directive? No - Patient declined  Copy of Healthcare Power of Attorney in Chart? Yes - validated most recent copy scanned in chart (See row information)  Pre-existing out of facility DNR order (yellow form or pink MOST form) Pink MOST form placed in chart (order not valid for inpatient use)     Chief Complaint  Patient presents with   Acute Visit    Patient is being seen for pain control   Quality Metric Gaps    Our records indicate that you are due for your Annual Wellness Visit     HPI:  Pt is a 89 y.o. female seen today for an acute visit for goals of care and Pian control  Now in SNF in [place] [facility] started having acute back pain and rib pain which  was not responding to Tylenol. Patient unable to get up and do her ADLs  Pain was severe and Son decided to take her to ED CT SCAN done there of chest  No Acute changes Just Hiatal Hernia  CT scan of Lumbar showed Severe degenerative disc disease of multiple levels including T11-T12, T12-L1, and L1-L2.  Son wanted to know today if we can Schedule the Oxycodone and also about hospice Patient  was lying in bed Relaxed with no signs of distress She said her pain is mild    Past Medical History:  Diagnosis Date   Arthritis    "all over"   BACK PAIN 03/08/2007   Cancer (HCC)    squamous cell on R leg & face- ?basal cell   Cutaneous abscess of right lower limb    DEPRESSIVE DISORDER 12/01/2008   Disruption of external operation (surgical) wound, not elsewhere classified, initial encounter    Duodenal ulcer    Essential (primary) hypertension    GI bleed 09/12/2010   Naproxen induced Endoscopy with dr Marjorie Smolder    HIATAL HERNIA 12/17/2006   History of hiatal hernia    HYPERLIPIDEMIA 03/09/2006   Hyperlipidemia, unspecified    HYPERTENSION 03/09/2006   KNEE PAIN 05/06/2009   Macular degeneration    Major depressive disorder, single episode, unspecified    Pneumonia    32yrs. ago- hosp. pneumonia   Urinary urgency    Past Surgical History:  Procedure Laterality Date   ABDOMINAL HYSTERECTOMY  1975   APPLICATION OF A-CELL OF EXTREMITY Right 12/10/2014   Procedure: APPLICATION OF A-CELL OF EXTREMITY;  Surgeon: Wayland Denis, DO;  Location: [facility] OR;  Service: Plastics;  Laterality: Right;   arthroscopic knee Right    x 2   BACK SURGERY  2000   spinal fusion   BILATERAL SALPINGECTOMY Bilateral 1989   Dr. Gavin Potters   CARDIAC CATHETERIZATION     EYE SURGERY     cataracts bilateral - removed   HERNIA REPAIR Bilateral    inguinal    I & D EXTREMITY Right 12/10/2014   Procedure: IRRIGATION AND DEBRIDEMENT ON RIGHT LEG, ;  Surgeon: Wayland Denis, DO;  Location: [facility] OR;  Service: Plastics;   Laterality: Right;   I & D EXTREMITY Right 01/20/2015   Procedure: IRRIGATION AND DEBRIDEMENT RIGHT LOWER LEG WOUND, with ACELL to Donor Site, SKIN GRAFT AND VAC Placement;  Surgeon: Wayland Denis, DO;  Location: [facility] OR;  Service: Plastics;  Laterality: Right;   LUMBAR LAMINECTOMY  02-17-1999   with sinal fusion L3,4,5,&S1   moes     moses surgery on R lle   OOPHORECTOMY  1989   RESECTION DISTAL CLAVICAL     ROTATOR CUFF REPAIR Right 2001   x2   SKIN GRAFT Right 05/29/2018   THYROIDECTOMY     TONSILLECTOMY  1928   TUBAL LIGATION      Allergies  Allergen Reactions   Naproxen     Gi bleed   Nsaids     GI BLEED   Aspirin     GI bleed   Ace Inhibitors     Cough    Caffeine Other (See Comments)    Causes joints to be painful    Outpatient Encounter Medications as of 11/20/2022  Medication Sig   acetaminophen (TYLENOL) 500 MG tablet Take 1,000 mg by mouth in the morning and at bedtime.   Ascorbic Acid (VITAMIN C) 1000 MG tablet Take 1,000 mg by mouth daily.   Carboxymethylcellulose Sodium (THERATEARS) 0.25 % SOLN Apply 2-3 drops to eye in the morning, at noon, in the evening, and at bedtime.   Cholecalciferol (VITAMIN D3) 50 MCG (2000 UT) TABS Take 1 tablet by mouth daily.   DULoxetine (CYMBALTA) 20 MG capsule Take 1 capsule (20 mg total) by mouth daily.   hydrALAZINE (APRESOLINE) 10 MG tablet Take 10 mg by mouth 2 (two) times daily as needed.   lidocaine 4 % Place 2 patches onto the skin every morning. Apply 2 patches to middles and right back.   LORazepam (ATIVAN) 0.5 MG tablet Take 1 tablet (0.5 mg total) by mouth every 6 (six) hours as needed for anxiety.   losartan (COZAAR) 50 MG tablet Take 1.5 tablets (75 mg total) by mouth daily.   methocarbamol (ROBAXIN) 500 MG tablet Take 500 mg by mouth every 6 (six) hours as needed for muscle spasms.   Multiple Vitamins-Minerals (PRESERVISION AREDS 2+MULTI VIT PO) Take by mouth 2 (two) times daily.   Omega-3 1000 MG CAPS Take 2,000 mg  by mouth 2 (two) times daily.   oxyCODONE (ROXICODONE) 5 MG immediate release tablet Take 1 tablet (5 mg total) by mouth every 4 (four) hours as needed for severe pain.   oxymetazoline (AFRIN NASAL SPRAY) 0.05 % nasal spray Place 2 sprays into both nostrils daily as needed (nose bleed).   polyethylene glycol powder (GLYCOLAX/MIRALAX) 17 GM/SCOOP powder Take 17 g by mouth daily as needed.   Pregnenolone 50 MG TABS Take 1 tablet by mouth every morning.   sennosides-docusate sodium (SENOKOT-S) 8.6-50 MG tablet Take 2 tablets by mouth daily.  No facility-administered encounter medications on file as of 11/20/2022.    Review of Systems  Constitutional:  Positive for activity change. Negative for appetite change.  HENT: Negative.    Respiratory:  Negative for cough and shortness of breath.   Cardiovascular:  Negative for leg swelling.  Gastrointestinal:  Negative for constipation.  Genitourinary: Negative.   Musculoskeletal:  Positive for arthralgias, back pain, gait problem and myalgias.  Skin: Negative.   Neurological:  Negative for dizziness and weakness.  Psychiatric/Behavioral:  Negative for confusion, dysphoric mood and sleep disturbance.     Immunization History  Administered Date(s) Administered   Covid-19, Mrna,Vaccine(Spikevax)33yrs and older 09/21/2022   Fluad Quad(high Dose 65+) 04/05/2020   Influenza Split 03/08/2011, 03/05/2012   Influenza Whole 03/08/2007, 03/02/2009, 03/29/2010   Influenza, High Dose Seasonal PF 03/21/2013, 06/13/2017, 03/07/2019, 03/26/2020   Influenza,inj,Quad PF,6+ Mos 02/20/2014, 02/11/2015, 03/22/2018   Influenza-Unspecified 03/23/2016, 04/05/2020, 03/03/2022   Moderna Covid-19 Vaccine Bivalent Booster 17yrs & up 03/10/2021   Moderna SARS-COV2 Booster Vaccination 09/20/2020   Moderna Sars-Covid-2 Vaccination 06/10/2019, 07/09/2019, 10/13/2021, 04/03/2022   PFIZER(Purple Top)SARS-COV-2 Vaccination 04/05/2020   Pneumococcal Conjugate-13 07/30/2014    Pneumococcal Polysaccharide-23 02/03/2002, 12/16/2009   Td 02/03/2002   Tdap 12/11/2012   Zoster Recombinat (Shingrix) 05/10/2022, 07/11/2022   Pertinent  Health Maintenance Due  Topic Date Due   INFLUENZA VACCINE  12/28/2022   DEXA SCAN  Completed      05/08/2022    1:48 PM 05/18/2022   11:47 AM 05/19/2022   10:07 AM 08/08/2022    1:26 PM 11/20/2022    1:03 PM  Fall Risk  Falls in the past year? 1  1 1 1   Was there an injury with Fall? 0  0 1 1  Fall Risk Category Calculator 2  2 2 3   Fall Risk Category (Retired) Moderate  Moderate    (RETIRED) Patient Fall Risk Level High fall risk High fall risk High fall risk    Patient at Risk for Falls Due to History of fall(s);Impaired balance/gait;Impaired mobility  Impaired balance/gait;Impaired mobility History of fall(s) History of fall(s);Impaired balance/gait;Impaired mobility;Impaired vision  Fall risk Follow up Falls evaluation completed  Falls evaluation completed Falls evaluation completed Falls evaluation completed   Functional Status Survey:    Vitals:   11/20/22 1258  BP: (!) 149/68  Pulse: 62  Resp: 16  Temp: (!) 96.8 F (36 C)  SpO2: 93%  Weight: 122 lb 6.4 oz (55.5 kg)  Height: 4\' 11"  (1.499 m)   Body mass index is 24.72 kg/m. Physical Exam Vitals reviewed.  Constitutional:      Appearance: Normal appearance.  HENT:     Head: Normocephalic.     Nose: Nose normal.     Mouth/Throat:     Mouth: Mucous membranes are moist.     Pharynx: Oropharynx is clear.  Eyes:     Pupils: Pupils are equal, round, and reactive to light.  Cardiovascular:     Rate and Rhythm: Normal rate and regular rhythm.     Pulses: Normal pulses.     Heart sounds: Normal heart sounds. No murmur heard. Pulmonary:     Effort: Pulmonary effort is normal.     Breath sounds: Normal breath sounds.  Abdominal:     General: Abdomen is flat. Bowel sounds are normal.     Palpations: Abdomen is soft.  Musculoskeletal:        General: No  swelling.     Cervical back: Neck supple.  Skin:  General: Skin is warm.  Neurological:     General: No focal deficit present.     Mental Status: She is alert.  Psychiatric:        Mood and Affect: Mood normal.        Thought Content: Thought content normal.     Labs reviewed: Recent Labs    02/17/22 1010 03/03/22 0000 04/18/22 1345 08/08/22 0000  NA 141 141 143 138  K 3.6 4.1 4.4 4.0  CL 109 102 103 101  CO2 26 27* 25* 26*  GLUCOSE 95  --   --   --   BUN 19 18 14 21   CREATININE 0.49 0.7 0.6 0.6  CALCIUM 8.6* 9.4 10.2 9.4   Recent Labs    03/03/22 0000  AST 20  ALT 14  ALKPHOS 52  ALBUMIN 4.6   Recent Labs    02/17/22 1010 04/18/22 1345 08/08/22 0000  WBC 5.2 6.1 4.9  HGB 11.4* 12.9 11.5*  HCT 35.3* 41 35*  MCV 93.6  --   --   PLT 153 291 157   Lab Results  Component Value Date   TSH 1.39 01/02/2022   No results found for: "HGBA1C" Lab Results  Component Value Date   CHOL 238 (A) 08/20/2018   HDL 67 08/20/2018   LDLCALC 154 08/20/2018   LDLDIRECT 118.7 07/20/2011   TRIG 85 08/20/2018   CHOLHDL 4 12/30/2013    Significant Diagnostic Results in last 30 days:  CT Lumbar Spine Wo Contrast  Result Date: 11/19/2022 CLINICAL DATA:  89 year old female with low back pain EXAM: CT LUMBAR SPINE WITHOUT CONTRAST TECHNIQUE: Multidetector CT imaging of the lumbar spine was performed without intravenous contrast administration. Multiplanar CT image reconstructions were also generated. RADIATION DOSE REDUCTION: This exam was performed according to the departmental dose-optimization program which includes automated exposure control, adjustment of the mA and/or kV according to patient size and/or use of iterative reconstruction technique. COMPARISON:  X-ray 12/29/2016 FINDINGS: Segmentation: 5 lumbar type vertebrae. Alignment: Grade 1 anterolisthesis of L3 on L4, L4 on L5, and L5 on S1. Mild retrolisthesis at T12-L1 and L1-L2. Vertebrae: Posterior instrumented  fusion spanning L2-L5. Lumbar vertebral body heights are maintained without evidence of fracture. Bones are demineralized. No lytic or sclerotic bone lesion. Paraspinal and other soft tissues: Hiatal hernia. Aortic atherosclerosis. Colonic diverticulosis. Disc levels: Severe degenerative disc disease of multiple levels including T11-T12, T12-L1, and L1-L2. There is also severe degenerative disc disease at the T9-T10 level seen at the edge of the field of view. IMPRESSION: 1. No acute fracture or traumatic malalignment of the lumbar spine. 2. Posterior instrumented fusion spanning L2-L5. 3. Severe degenerative disc disease of multiple levels including T11-T12, T12-L1, and L1-L2. 4. Aortic atherosclerosis (ICD10-I70.0). Electronically Signed   By: Duanne Guess D.O.   On: 11/19/2022 15:35   CT Chest Wo Contrast  Result Date: 11/19/2022 CLINICAL DATA:  One-week history of left-sided back pain. No known injury. EXAM: CT CHEST WITHOUT CONTRAST TECHNIQUE: Multidetector CT imaging of the chest was performed following the standard protocol without IV contrast. RADIATION DOSE REDUCTION: This exam was performed according to the departmental dose-optimization program which includes automated exposure control, adjustment of the mA and/or kV according to patient size and/or use of iterative reconstruction technique. COMPARISON:  Chest radiograph dated 07/18/2016 FINDINGS: Cardiovascular: Normal heart size. No significant pericardial fluid/thickening. Ascending thoracic aorta measures 4.2 x 4.1 cm. Coronary artery calcifications and aortic atherosclerosis. Mediastinum/Nodes: Imaged thyroid gland without nodules meeting criteria for imaging  follow-up by size. Moderate hiatal hernia. No pathologically enlarged axillary, supraclavicular, mediastinal, or hilar lymph nodes. Lungs/Pleura: The central airways are patent. Left apical linear atelectasis/scarring. Bibasilar linear scarring with mild traction bronchiectasis. No focal  consolidation. No pneumothorax. No pleural effusion. Upper abdomen: 1.0 cm cyst in the posterior hepatic segment 7 (2:108). Colonic diverticulosis without acute diverticulitis. Musculoskeletal: No acute or abnormal lytic or blastic osseous lesions. Multilevel degenerative changes of the thoracic spine. Partially imaged lumbar spinal fixation hardware. IMPRESSION: 1. No acute intrathoracic abnormality. 2. Moderate hiatal hernia. 3. Ascending thoracic aortic aneurysm measuring up to 4.2 cm. Recommend annual imaging followup by CTA or MRA. This recommendation follows 2010 ACCF/AHA/AATS/ACR/ASA/SCA/SCAI/SIR/STS/SVM Guidelines for the Diagnosis and Management of Patients with Thoracic Aortic Disease. Circulation. 2010; 121: U045-W098. Aortic aneurysm NOS (ICD10-I71.9) 4. Aortic Atherosclerosis (ICD10-I70.0). Coronary artery calcifications. Assessment for potential risk factor modification, dietary therapy or pharmacologic therapy may be warranted, if clinically indicated. Electronically Signed   By: Agustin Cree M.D.   On: 11/19/2022 15:32    Assessment/Plan 1. Acute midline low back pain without sciatica CT scan of Chest and Lumbar region just showed Degenerative arthritis Will schedule Oxycodone and Tylenol alternate Also has Robaxin and Ativan PRN Family aware that this is causing her to be more lethargic and Sleepy and Confused But they want her to be comfortable  2. Rib pain No Rib Fracture seen in CT can Continue management of pain  3. Mild cognitive impairment with memory loss Worsening due to Pain meds  4. Essential hypertension Cozaar  5. Major depressive disorder with single episode, in full remission (HCC) Cymbalta and Ativan PRn  6 Goals of care Discussed with the patient's son about hospice    Therapy thinks that they can still work with her to keep her more mobile.   Patient is already able to get up with her walker.  Will continue palliative comfort based care for now.  Family/  staff Communication:   Labs/tests ordered:

## 2022-11-21 DIAGNOSIS — M150 Primary generalized (osteo)arthritis: Secondary | ICD-10-CM | POA: Diagnosis not present

## 2022-11-21 DIAGNOSIS — Z9181 History of falling: Secondary | ICD-10-CM | POA: Diagnosis not present

## 2022-11-21 DIAGNOSIS — F05 Delirium due to known physiological condition: Secondary | ICD-10-CM | POA: Diagnosis not present

## 2022-11-21 DIAGNOSIS — M6259 Muscle wasting and atrophy, not elsewhere classified, multiple sites: Secondary | ICD-10-CM | POA: Diagnosis not present

## 2022-11-21 DIAGNOSIS — R2681 Unsteadiness on feet: Secondary | ICD-10-CM | POA: Diagnosis not present

## 2022-11-21 DIAGNOSIS — M6281 Muscle weakness (generalized): Secondary | ICD-10-CM | POA: Diagnosis not present

## 2022-11-21 DIAGNOSIS — R278 Other lack of coordination: Secondary | ICD-10-CM | POA: Diagnosis not present

## 2022-11-22 DIAGNOSIS — M6259 Muscle wasting and atrophy, not elsewhere classified, multiple sites: Secondary | ICD-10-CM | POA: Diagnosis not present

## 2022-11-22 DIAGNOSIS — Z9181 History of falling: Secondary | ICD-10-CM | POA: Diagnosis not present

## 2022-11-23 ENCOUNTER — Other Ambulatory Visit: Payer: Self-pay | Admitting: Adult Health

## 2022-11-23 DIAGNOSIS — M150 Primary generalized (osteo)arthritis: Secondary | ICD-10-CM | POA: Diagnosis not present

## 2022-11-23 DIAGNOSIS — R278 Other lack of coordination: Secondary | ICD-10-CM | POA: Diagnosis not present

## 2022-11-23 DIAGNOSIS — R2681 Unsteadiness on feet: Secondary | ICD-10-CM | POA: Diagnosis not present

## 2022-11-23 DIAGNOSIS — M6281 Muscle weakness (generalized): Secondary | ICD-10-CM | POA: Diagnosis not present

## 2022-11-23 DIAGNOSIS — F05 Delirium due to known physiological condition: Secondary | ICD-10-CM | POA: Diagnosis not present

## 2022-11-23 MED ORDER — OXYCODONE HCL 5 MG PO TABS: 5.0000 mg | ORAL_TABLET | Freq: Four times a day (QID) | ORAL | 0 refills | Status: DC

## 2022-11-24 ENCOUNTER — Other Ambulatory Visit: Payer: Self-pay | Admitting: Adult Health

## 2022-11-24 MED ORDER — OXYCODONE HCL 5 MG PO TABS: 10.0000 mg | ORAL_TABLET | ORAL | 0 refills | Status: AC | PRN

## 2022-11-27 DIAGNOSIS — Z9181 History of falling: Secondary | ICD-10-CM | POA: Diagnosis not present

## 2022-11-27 DIAGNOSIS — F05 Delirium due to known physiological condition: Secondary | ICD-10-CM | POA: Diagnosis not present

## 2022-11-27 DIAGNOSIS — M150 Primary generalized (osteo)arthritis: Secondary | ICD-10-CM | POA: Diagnosis not present

## 2022-11-27 DIAGNOSIS — R278 Other lack of coordination: Secondary | ICD-10-CM | POA: Diagnosis not present

## 2022-11-27 DIAGNOSIS — R2681 Unsteadiness on feet: Secondary | ICD-10-CM | POA: Diagnosis not present

## 2022-11-27 DIAGNOSIS — M6259 Muscle wasting and atrophy, not elsewhere classified, multiple sites: Secondary | ICD-10-CM | POA: Diagnosis not present

## 2022-11-27 DIAGNOSIS — M6281 Muscle weakness (generalized): Secondary | ICD-10-CM | POA: Diagnosis not present

## 2022-11-28 DIAGNOSIS — R2681 Unsteadiness on feet: Secondary | ICD-10-CM | POA: Diagnosis not present

## 2022-11-28 DIAGNOSIS — M150 Primary generalized (osteo)arthritis: Secondary | ICD-10-CM | POA: Diagnosis not present

## 2022-11-28 DIAGNOSIS — R278 Other lack of coordination: Secondary | ICD-10-CM | POA: Diagnosis not present

## 2022-11-28 DIAGNOSIS — M6281 Muscle weakness (generalized): Secondary | ICD-10-CM | POA: Diagnosis not present

## 2022-11-28 DIAGNOSIS — F05 Delirium due to known physiological condition: Secondary | ICD-10-CM | POA: Diagnosis not present

## 2022-11-30 DIAGNOSIS — Z9181 History of falling: Secondary | ICD-10-CM | POA: Diagnosis not present

## 2022-11-30 DIAGNOSIS — M150 Primary generalized (osteo)arthritis: Secondary | ICD-10-CM | POA: Diagnosis not present

## 2022-11-30 DIAGNOSIS — R278 Other lack of coordination: Secondary | ICD-10-CM | POA: Diagnosis not present

## 2022-11-30 DIAGNOSIS — M6259 Muscle wasting and atrophy, not elsewhere classified, multiple sites: Secondary | ICD-10-CM | POA: Diagnosis not present

## 2022-11-30 DIAGNOSIS — F05 Delirium due to known physiological condition: Secondary | ICD-10-CM | POA: Diagnosis not present

## 2022-11-30 DIAGNOSIS — M6281 Muscle weakness (generalized): Secondary | ICD-10-CM | POA: Diagnosis not present

## 2022-11-30 DIAGNOSIS — R2681 Unsteadiness on feet: Secondary | ICD-10-CM | POA: Diagnosis not present

## 2022-12-04 ENCOUNTER — Non-Acute Institutional Stay (SKILLED_NURSING_FACILITY): Payer: Medicare HMO | Admitting: Internal Medicine

## 2022-12-04 DIAGNOSIS — R2681 Unsteadiness on feet: Secondary | ICD-10-CM | POA: Diagnosis not present

## 2022-12-04 DIAGNOSIS — M545 Low back pain, unspecified: Secondary | ICD-10-CM | POA: Diagnosis not present

## 2022-12-04 DIAGNOSIS — M150 Primary generalized (osteo)arthritis: Secondary | ICD-10-CM | POA: Diagnosis not present

## 2022-12-04 DIAGNOSIS — G3184 Mild cognitive impairment, so stated: Secondary | ICD-10-CM

## 2022-12-04 DIAGNOSIS — M6281 Muscle weakness (generalized): Secondary | ICD-10-CM | POA: Diagnosis not present

## 2022-12-04 DIAGNOSIS — R0781 Pleurodynia: Secondary | ICD-10-CM | POA: Diagnosis not present

## 2022-12-04 DIAGNOSIS — R278 Other lack of coordination: Secondary | ICD-10-CM | POA: Diagnosis not present

## 2022-12-04 DIAGNOSIS — Z9181 History of falling: Secondary | ICD-10-CM | POA: Diagnosis not present

## 2022-12-04 DIAGNOSIS — M6259 Muscle wasting and atrophy, not elsewhere classified, multiple sites: Secondary | ICD-10-CM | POA: Diagnosis not present

## 2022-12-04 DIAGNOSIS — F05 Delirium due to known physiological condition: Secondary | ICD-10-CM | POA: Diagnosis not present

## 2022-12-04 NOTE — Progress Notes (Signed)
Location: [facility] of Service:  SNF (31)  Provider:   Code Status: DNR Goals of Care:     11/20/2022    1:02 PM  Advanced Directives  Does Patient Have a Medical Advance Directive? Yes  Type of Estate agent of [place];Living will;Out of facility DNR (pink MOST or yellow form)  Does patient want to make changes to medical advance directive? No - Patient declined  Copy of Healthcare Power of Attorney in Chart? Yes - validated most recent copy scanned in chart (See row information)  Pre-existing out of facility DNR order (yellow form or pink MOST form) Pink MOST form placed in chart (order not valid for inpatient use)     Chief Complaint  Patient presents with   Acute Visit    HPI: Patient is a 89 y.o. female seen today for an acute visit to taper Oxycodone  Now in SNF in [place]  Patient started having acute back pain and rib pain which was not responding to Tylenol. Patient unable to get up and do her ADLs  Pain was severe and Son decided to take her to ED CT SCAN done there of chest  No Acute changes Just Hiatal Hernia  CT scan of Lumbar showed Severe degenerative disc disease of multiple levels including T11-T12, T12-L1, and L1-L2.  Since then Patient was scheduled on Oxycodone high doses to help her pain The pain did get better but now patient is feeling better but stays confused And Sleepy Her other son is visiting now And they want to try taper oxycodone   Patient was confused and sleepy She said her pain is better Later I saw her walking with her walker with her son   Past Medical History:  Diagnosis Date   Arthritis    "all over"   BACK PAIN 03/08/2007   Cancer (HCC)    squamous cell on R leg & face- ?basal cell   Cutaneous abscess of right lower limb    DEPRESSIVE DISORDER 12/01/2008   Disruption of external operation (surgical) wound, not elsewhere classified, initial encounter    Duodenal ulcer     Essential (primary) hypertension    GI bleed 09/12/2010   Naproxen induced Endoscopy with dr Marjorie Smolder    HIATAL HERNIA 12/17/2006   History of hiatal hernia    HYPERLIPIDEMIA 03/09/2006   Hyperlipidemia, unspecified    HYPERTENSION 03/09/2006   KNEE PAIN 05/06/2009   Macular degeneration    Major depressive disorder, single episode, unspecified    Pneumonia    50yrs. ago- hosp. pneumonia   Urinary urgency     Past Surgical History:  Procedure Laterality Date   ABDOMINAL HYSTERECTOMY  1975   APPLICATION OF A-CELL OF EXTREMITY Right 12/10/2014   Procedure: APPLICATION OF A-CELL OF EXTREMITY;  Surgeon: Wayland Denis, DO;  Location: [facility] OR;  Service: Plastics;  Laterality: Right;   arthroscopic knee Right    x 2   BACK SURGERY  2000   spinal fusion   BILATERAL SALPINGECTOMY Bilateral 1989   Dr. Gavin Potters   CARDIAC CATHETERIZATION     EYE SURGERY     cataracts bilateral - removed   HERNIA REPAIR Bilateral    inguinal    I & D EXTREMITY Right 12/10/2014   Procedure: IRRIGATION AND DEBRIDEMENT ON RIGHT LEG, ;  Surgeon: Wayland Denis, DO;  Location: [facility] OR;  Service: Plastics;  Laterality: Right;   I & D EXTREMITY Right 01/20/2015   Procedure: IRRIGATION AND  DEBRIDEMENT RIGHT LOWER LEG WOUND, with ACELL to Donor Site, SKIN GRAFT AND VAC Placement;  Surgeon: Wayland Denis, DO;  Location: [facility] OR;  Service: Plastics;  Laterality: Right;   LUMBAR LAMINECTOMY  02-17-1999   with sinal fusion L3,4,5,&S1   moes     moses surgery on R lle   OOPHORECTOMY  1989   RESECTION DISTAL CLAVICAL     ROTATOR CUFF REPAIR Right 2001   x2   SKIN GRAFT Right 05/29/2018   THYROIDECTOMY     TONSILLECTOMY  1928   TUBAL LIGATION      Allergies  Allergen Reactions   Naproxen     Gi bleed   Nsaids     GI BLEED   Aspirin     GI bleed   Ace Inhibitors     Cough    Caffeine Other (See Comments)    Causes joints to be painful    Outpatient Encounter Medications as of 12/04/2022  Medication Sig   oxyCODONE  (ROXICODONE) 5 MG immediate release tablet Take 2 tablets (10 mg total) by mouth every 4 (four) hours as needed for up to 14 days for severe pain. X 14 days   acetaminophen (TYLENOL) 500 MG tablet Take 1,000 mg by mouth in the morning and at bedtime.   Ascorbic Acid (VITAMIN C) 1000 MG tablet Take 1,000 mg by mouth daily.   Carboxymethylcellulose Sodium (THERATEARS) 0.25 % SOLN Apply 2-3 drops to eye in the morning, at noon, in the evening, and at bedtime.   Cholecalciferol (VITAMIN D3) 50 MCG (2000 UT) TABS Take 1 tablet by mouth daily.   DULoxetine (CYMBALTA) 20 MG capsule Take 1 capsule (20 mg total) by mouth daily.   hydrALAZINE (APRESOLINE) 10 MG tablet Take 10 mg by mouth 2 (two) times daily as needed.   lidocaine 4 % Place 2 patches onto the skin every morning. Apply 2 patches to middles and right back.   LORazepam (ATIVAN) 0.5 MG tablet Take 1 tablet (0.5 mg total) by mouth every 6 (six) hours as needed for anxiety.   losartan (COZAAR) 50 MG tablet Take 1.5 tablets (75 mg total) by mouth daily.   methocarbamol (ROBAXIN) 500 MG tablet Take 500 mg by mouth every 6 (six) hours as needed for muscle spasms.   Multiple Vitamins-Minerals (PRESERVISION AREDS 2+MULTI VIT PO) Take by mouth 2 (two) times daily.   Omega-3 1000 MG CAPS Take 2,000 mg by mouth 2 (two) times daily.   oxyCODONE (ROXICODONE) 5 MG immediate release tablet Take 1 tablet (5 mg total) by mouth 4 (four) times daily.   oxymetazoline (AFRIN NASAL SPRAY) 0.05 % nasal spray Place 2 sprays into both nostrils daily as needed (nose bleed).   polyethylene glycol powder (GLYCOLAX/MIRALAX) 17 GM/SCOOP powder Take 17 g by mouth daily as needed.   Pregnenolone 50 MG TABS Take 1 tablet by mouth every morning.   sennosides-docusate sodium (SENOKOT-S) 8.6-50 MG tablet Take 2 tablets by mouth daily.   No facility-administered encounter medications on file as of 12/04/2022.    Review of Systems:  Review of Systems  Unable to perform ROS:  Dementia    Health Maintenance  Topic Date Due   Medicare Annual Wellness (AWV)  10/12/2022   COVID-19 Vaccine (8 - 2023-24 season) 01/06/2023 (Originally 11/16/2022)   DTaP/Tdap/Td (3 - Td or Tdap) 12/12/2022   INFLUENZA VACCINE  12/28/2022   Pneumonia Vaccine 46+ Years old  Completed   DEXA SCAN  Completed   Zoster Vaccines- Shingrix  Completed   HPV VACCINES  Aged Out    Physical Exam: There were no vitals filed for this visit. There is no height or weight on file to calculate BMI. Physical Exam Vitals reviewed.  Constitutional:      Appearance: Normal appearance.  HENT:     Head: Normocephalic.     Nose: Nose normal.     Mouth/Throat:     Mouth: Mucous membranes are moist.     Pharynx: Oropharynx is clear.  Eyes:     Pupils: Pupils are equal, round, and reactive to light.  Cardiovascular:     Rate and Rhythm: Normal rate and regular rhythm.     Pulses: Normal pulses.     Heart sounds: Normal heart sounds. No murmur heard. Pulmonary:     Effort: Pulmonary effort is normal.     Breath sounds: Normal breath sounds.  Abdominal:     General: Abdomen is flat. Bowel sounds are normal.     Palpations: Abdomen is soft.  Musculoskeletal:        General: No swelling.     Cervical back: Neck supple.  Skin:    General: Skin is warm.  Neurological:     General: No focal deficit present.     Mental Status: She is alert.     Comments: Oriented to herself Did not know her age or name of place  Psychiatric:        Mood and Affect: Mood normal.        Thought Content: Thought content normal.     Labs reviewed: Basic Metabolic Panel: Recent Labs    01/02/22 0000 02/17/22 1010 02/17/22 1010 03/03/22 0000 04/18/22 1345 08/08/22 0000  NA  --  141  --  141 143 138  K  --  3.6   < > 4.1 4.4 4.0  CL  --  109   < > 102 103 101  CO2  --  26   < > 27* 25* 26*  GLUCOSE  --  95  --   --   --   --   BUN  --  19  --  18 14 21   CREATININE  --  0.49  --  0.7 0.6 0.6  CALCIUM   --  8.6*   < > 9.4 10.2 9.4  TSH 1.39  --   --   --   --   --    < > = values in this interval not displayed.   Liver Function Tests: Recent Labs    03/03/22 0000  AST 20  ALT 14  ALKPHOS 52  ALBUMIN 4.6   No results for input(s): "LIPASE", "AMYLASE" in the last 8760 hours. No results for input(s): "AMMONIA" in the last 8760 hours. CBC: Recent Labs    02/17/22 1010 04/18/22 1345 08/08/22 0000  WBC 5.2 6.1 4.9  HGB 11.4* 12.9 11.5*  HCT 35.3* 41 35*  MCV 93.6  --   --   PLT 153 291 157   Lipid Panel: No results for input(s): "CHOL", "HDL", "LDLCALC", "TRIG", "CHOLHDL", "LDLDIRECT" in the last 8760 hours. No results found for: "HGBA1C"  Procedures since last visit: CT Lumbar Spine Wo Contrast  Result Date: 11/19/2022 CLINICAL DATA:  89 year old female with low back pain EXAM: CT LUMBAR SPINE WITHOUT CONTRAST TECHNIQUE: Multidetector CT imaging of the lumbar spine was performed without intravenous contrast administration. Multiplanar CT image reconstructions were also generated. RADIATION DOSE REDUCTION: This exam was performed according to the departmental dose-optimization  program which includes automated exposure control, adjustment of the mA and/or kV according to patient size and/or use of iterative reconstruction technique. COMPARISON:  X-ray 12/29/2016 FINDINGS: Segmentation: 5 lumbar type vertebrae. Alignment: Grade 1 anterolisthesis of L3 on L4, L4 on L5, and L5 on S1. Mild retrolisthesis at T12-L1 and L1-L2. Vertebrae: Posterior instrumented fusion spanning L2-L5. Lumbar vertebral body heights are maintained without evidence of fracture. Bones are demineralized. No lytic or sclerotic bone lesion. Paraspinal and other soft tissues: Hiatal hernia. Aortic atherosclerosis. Colonic diverticulosis. Disc levels: Severe degenerative disc disease of multiple levels including T11-T12, T12-L1, and L1-L2. There is also severe degenerative disc disease at the T9-T10 level seen at the  edge of the field of view. IMPRESSION: 1. No acute fracture or traumatic malalignment of the lumbar spine. 2. Posterior instrumented fusion spanning L2-L5. 3. Severe degenerative disc disease of multiple levels including T11-T12, T12-L1, and L1-L2. 4. Aortic atherosclerosis (ICD10-I70.0). Electronically Signed   By: Duanne Guess D.O.   On: 11/19/2022 15:35   CT Chest Wo Contrast  Result Date: 11/19/2022 CLINICAL DATA:  One-week history of left-sided back pain. No known injury. EXAM: CT CHEST WITHOUT CONTRAST TECHNIQUE: Multidetector CT imaging of the chest was performed following the standard protocol without IV contrast. RADIATION DOSE REDUCTION: This exam was performed according to the departmental dose-optimization program which includes automated exposure control, adjustment of the mA and/or kV according to patient size and/or use of iterative reconstruction technique. COMPARISON:  Chest radiograph dated 07/18/2016 FINDINGS: Cardiovascular: Normal heart size. No significant pericardial fluid/thickening. Ascending thoracic aorta measures 4.2 x 4.1 cm. Coronary artery calcifications and aortic atherosclerosis. Mediastinum/Nodes: Imaged thyroid gland without nodules meeting criteria for imaging follow-up by size. Moderate hiatal hernia. No pathologically enlarged axillary, supraclavicular, mediastinal, or hilar lymph nodes. Lungs/Pleura: The central airways are patent. Left apical linear atelectasis/scarring. Bibasilar linear scarring with mild traction bronchiectasis. No focal consolidation. No pneumothorax. No pleural effusion. Upper abdomen: 1.0 cm cyst in the posterior hepatic segment 7 (2:108). Colonic diverticulosis without acute diverticulitis. Musculoskeletal: No acute or abnormal lytic or blastic osseous lesions. Multilevel degenerative changes of the thoracic spine. Partially imaged lumbar spinal fixation hardware. IMPRESSION: 1. No acute intrathoracic abnormality. 2. Moderate hiatal hernia. 3.  Ascending thoracic aortic aneurysm measuring up to 4.2 cm. Recommend annual imaging followup by CTA or MRA. This recommendation follows 2010 ACCF/AHA/AATS/ACR/ASA/SCA/SCAI/SIR/STS/SVM Guidelines for the Diagnosis and Management of Patients with Thoracic Aortic Disease. Circulation. 2010; 121: Z610-R604. Aortic aneurysm NOS (ICD10-I71.9) 4. Aortic Atherosclerosis (ICD10-I70.0). Coronary artery calcifications. Assessment for potential risk factor modification, dietary therapy or pharmacologic therapy may be warranted, if clinically indicated. Electronically Signed   By: Agustin Cree M.D.   On: 11/19/2022 15:32    Assessment/Plan 1. Acute midline low back pain without sciatica Will change Oxycodone to 2.5 mg BID Continue Prn Oxycodone also  Also on Prn Robaxin  2. Rib pain No Fractures pEr CT Continue Pain management  3. Mild cognitive impairment with memory loss Worsen by Meds But doing well in SNF    Labs/tests ordered:  * No order type specified * Next appt:  03/23/2023

## 2022-12-05 DIAGNOSIS — M6281 Muscle weakness (generalized): Secondary | ICD-10-CM | POA: Diagnosis not present

## 2022-12-05 DIAGNOSIS — Z9181 History of falling: Secondary | ICD-10-CM | POA: Diagnosis not present

## 2022-12-05 DIAGNOSIS — F05 Delirium due to known physiological condition: Secondary | ICD-10-CM | POA: Diagnosis not present

## 2022-12-05 DIAGNOSIS — M150 Primary generalized (osteo)arthritis: Secondary | ICD-10-CM | POA: Diagnosis not present

## 2022-12-05 DIAGNOSIS — M6259 Muscle wasting and atrophy, not elsewhere classified, multiple sites: Secondary | ICD-10-CM | POA: Diagnosis not present

## 2022-12-05 DIAGNOSIS — R2681 Unsteadiness on feet: Secondary | ICD-10-CM | POA: Diagnosis not present

## 2022-12-05 DIAGNOSIS — R278 Other lack of coordination: Secondary | ICD-10-CM | POA: Diagnosis not present

## 2022-12-07 DIAGNOSIS — F05 Delirium due to known physiological condition: Secondary | ICD-10-CM | POA: Diagnosis not present

## 2022-12-07 DIAGNOSIS — M150 Primary generalized (osteo)arthritis: Secondary | ICD-10-CM | POA: Diagnosis not present

## 2022-12-07 DIAGNOSIS — R278 Other lack of coordination: Secondary | ICD-10-CM | POA: Diagnosis not present

## 2022-12-07 DIAGNOSIS — R2681 Unsteadiness on feet: Secondary | ICD-10-CM | POA: Diagnosis not present

## 2022-12-07 DIAGNOSIS — Z9181 History of falling: Secondary | ICD-10-CM | POA: Diagnosis not present

## 2022-12-07 DIAGNOSIS — M6281 Muscle weakness (generalized): Secondary | ICD-10-CM | POA: Diagnosis not present

## 2022-12-07 DIAGNOSIS — M6259 Muscle wasting and atrophy, not elsewhere classified, multiple sites: Secondary | ICD-10-CM | POA: Diagnosis not present

## 2022-12-08 ENCOUNTER — Other Ambulatory Visit: Payer: Self-pay | Admitting: Adult Health

## 2022-12-08 MED ORDER — OXYCODONE HCL 5 MG PO TABS: 2.5000 mg | ORAL_TABLET | Freq: Two times a day (BID) | ORAL | 0 refills | Status: AC

## 2022-12-11 ENCOUNTER — Encounter: Payer: Self-pay | Admitting: Adult Health

## 2022-12-11 ENCOUNTER — Non-Acute Institutional Stay (SKILLED_NURSING_FACILITY): Payer: Medicare HMO | Admitting: Adult Health

## 2022-12-11 DIAGNOSIS — F0390 Unspecified dementia without behavioral disturbance: Secondary | ICD-10-CM

## 2022-12-11 DIAGNOSIS — F05 Delirium due to known physiological condition: Secondary | ICD-10-CM | POA: Diagnosis not present

## 2022-12-11 DIAGNOSIS — M6259 Muscle wasting and atrophy, not elsewhere classified, multiple sites: Secondary | ICD-10-CM | POA: Diagnosis not present

## 2022-12-11 DIAGNOSIS — Z9181 History of falling: Secondary | ICD-10-CM | POA: Diagnosis not present

## 2022-12-11 DIAGNOSIS — M545 Low back pain, unspecified: Secondary | ICD-10-CM | POA: Diagnosis not present

## 2022-12-11 DIAGNOSIS — M6281 Muscle weakness (generalized): Secondary | ICD-10-CM | POA: Diagnosis not present

## 2022-12-11 DIAGNOSIS — K5909 Other constipation: Secondary | ICD-10-CM | POA: Diagnosis not present

## 2022-12-11 DIAGNOSIS — M810 Age-related osteoporosis without current pathological fracture: Secondary | ICD-10-CM

## 2022-12-11 DIAGNOSIS — R2681 Unsteadiness on feet: Secondary | ICD-10-CM | POA: Diagnosis not present

## 2022-12-11 DIAGNOSIS — I10 Essential (primary) hypertension: Secondary | ICD-10-CM | POA: Diagnosis not present

## 2022-12-11 DIAGNOSIS — R278 Other lack of coordination: Secondary | ICD-10-CM | POA: Diagnosis not present

## 2022-12-11 DIAGNOSIS — M150 Primary generalized (osteo)arthritis: Secondary | ICD-10-CM | POA: Diagnosis not present

## 2022-12-11 NOTE — Progress Notes (Unsigned)
Location:  [facility] Room Number: 135A Place of Service:  SNF [id number]) Provider:  Tamsen Roers, MD  Patient Care Team: Mahlon Gammon, MD as PCP - General (Internal Medicine) Bernette Redbird, MD as Consulting Physician (Gastroenterology) Dannielle Huh, MD as Consulting Physician (Orthopedic Surgery) Ellen Henri as Consulting Physician (Dermatology) Cassell Clement, MD as Consulting Physician (Cardiology) Maris Berger, MD as Consulting Physician (Ophthalmology) Glennis Brink, MD as Referring Physician (Dermatology)  Extended Emergency Contact Information Primary Emergency Contact: Berton Mount of [place] Home Phone: [phone] Relation: Daughter Secondary Emergency Contact: [facility] Address: [phone] [place] [address] Darden Amber of [place] Home Phone: [phone] Mobile Phone: [phone] Relation: Son  Code Status:  DNR Goals of care: Advanced Directive information    12/11/2022    9:45 AM  Advanced Directives  Does Patient Have a Medical Advance Directive? Yes  Type of Estate agent of [place];Living will;Out of facility DNR (pink MOST or yellow form)  Does patient want to make changes to medical advance directive? No - Patient declined  Copy of Healthcare Power of Attorney in Chart? Yes - validated most recent copy scanned in chart (See row information)  Pre-existing out of facility DNR order (yellow form or pink MOST form) Pink MOST form placed in chart (order not valid for inpatient use)     Chief Complaint  Patient presents with   Medical Management of Chronic Issues    Patient is being seen for Routine visit   Quality Metric Gaps    Patient is due for a AWV     HPI:  Pt is a 89 y.o. female seen today for medical management of chronic diseases.   Moved to skilled care due to falls, weakness, and back pain Back pain was severe and  was on high dose oxycodone.  CT of the lumbar spine 11/19/22 in ED No acute fracture, some degenerative changes in the thoracic and lumbar area.  CT of the chest 11/19/22 showed a moderate hiatal hernia, and a thoracic aortic aneurysm 4.2cm recommend annual imaging.   Her back pain has improved. She is taking oxy 2.5 mg bid scheduled. No using prn. Family is requesting to taper off meds.   LBM 7/14  MMSE 10/30 11/01/22.  She was more confused when on higher doses of oxycodone. Remains ambulatory with a walker. Gets up without help, needs reminders   Past Medical History:  Diagnosis Date   Arthritis    "all over"   BACK PAIN 03/08/2007   Cancer (HCC)    squamous cell on R leg & face- ?basal cell   Cutaneous abscess of right lower limb    DEPRESSIVE DISORDER 12/01/2008   Disruption of external operation (surgical) wound, not elsewhere classified, initial encounter    Duodenal ulcer    Essential (primary) hypertension    GI bleed 09/12/2010   Naproxen induced Endoscopy with dr Marjorie Smolder    HIATAL HERNIA 12/17/2006   History of hiatal hernia    HYPERLIPIDEMIA 03/09/2006   Hyperlipidemia, unspecified    HYPERTENSION 03/09/2006   KNEE PAIN 05/06/2009   Macular degeneration    Major depressive disorder, single episode, unspecified    Pneumonia    45yrs. ago- hosp. pneumonia   Urinary urgency    Past Surgical History:  Procedure Laterality Date   ABDOMINAL HYSTERECTOMY  1975   APPLICATION OF A-CELL OF EXTREMITY Right 12/10/2014  Procedure: APPLICATION OF A-CELL OF EXTREMITY;  Surgeon: Wayland Denis, DO;  Location: [facility] OR;  Service: Plastics;  Laterality: Right;   arthroscopic knee Right    x 2   BACK SURGERY  2000   spinal fusion   BILATERAL SALPINGECTOMY Bilateral 1989   Dr. Gavin Potters   CARDIAC CATHETERIZATION     EYE SURGERY     cataracts bilateral - removed   HERNIA REPAIR Bilateral    inguinal    I & D EXTREMITY Right 12/10/2014   Procedure: IRRIGATION AND DEBRIDEMENT ON RIGHT  LEG, ;  Surgeon: Wayland Denis, DO;  Location: [facility] OR;  Service: Plastics;  Laterality: Right;   I & D EXTREMITY Right 01/20/2015   Procedure: IRRIGATION AND DEBRIDEMENT RIGHT LOWER LEG WOUND, with ACELL to Donor Site, SKIN GRAFT AND VAC Placement;  Surgeon: Wayland Denis, DO;  Location: [facility] OR;  Service: Plastics;  Laterality: Right;   LUMBAR LAMINECTOMY  02-17-1999   with sinal fusion L3,4,5,&S1   moes     moses surgery on R lle   OOPHORECTOMY  1989   RESECTION DISTAL CLAVICAL     ROTATOR CUFF REPAIR Right 2001   x2   SKIN GRAFT Right 05/29/2018   THYROIDECTOMY     TONSILLECTOMY  1928   TUBAL LIGATION      Allergies  Allergen Reactions   Naproxen     Gi bleed   Nsaids     GI BLEED   Aspirin     GI bleed   Ace Inhibitors     Cough    Caffeine Other (See Comments)    Causes joints to be painful    Outpatient Encounter Medications as of 12/11/2022  Medication Sig   acetaminophen (TYLENOL) 500 MG tablet Take 1,000 mg by mouth 3 (three) times daily.   Ascorbic Acid (VITAMIN C) 1000 MG tablet Take 1,000 mg by mouth daily.   Carboxymethylcellulose Sodium (THERATEARS) 0.25 % SOLN Apply 2-3 drops to eye in the morning, at noon, in the evening, and at bedtime.   Cholecalciferol (VITAMIN D3) 50 MCG (2000 UT) TABS Take 1 tablet by mouth daily.   DULoxetine (CYMBALTA) 20 MG capsule Take 1 capsule (20 mg total) by mouth daily.   hydrALAZINE (APRESOLINE) 10 MG tablet Take 10 mg by mouth 2 (two) times daily as needed.   LORazepam (ATIVAN) 0.5 MG tablet Take 1 tablet (0.5 mg total) by mouth every 6 (six) hours as needed for anxiety.   losartan (COZAAR) 50 MG tablet Take 1.5 tablets (75 mg total) by mouth daily.   methocarbamol (ROBAXIN) 500 MG tablet Take 500 mg by mouth every 6 (six) hours as needed for muscle spasms.   oxyCODONE (ROXICODONE) 5 MG immediate release tablet Take 0.5 tablets (2.5 mg total) by mouth 2 (two) times daily.   oxymetazoline (AFRIN NASAL SPRAY) 0.05 % nasal spray  Place 2 sprays into both nostrils daily as needed (nose bleed).   polyethylene glycol powder (GLYCOLAX/MIRALAX) 17 GM/SCOOP powder Take 17 g by mouth daily as needed.   Pregnenolone 50 MG TABS Take 1 tablet by mouth every morning.   sennosides-docusate sodium (SENOKOT-S) 8.6-50 MG tablet Take 2 tablets by mouth daily.   lidocaine 4 % Place 2 patches onto the skin every morning. Apply 2 patches to middles and right back. (Patient not taking: Reported on 12/11/2022)   Multiple Vitamins-Minerals (PRESERVISION AREDS 2+MULTI VIT PO) Take by mouth 2 (two) times daily. (Patient not taking: Reported on 12/11/2022)   Omega-3 1000 MG  CAPS Take 2,000 mg by mouth 2 (two) times daily. (Patient not taking: Reported on 12/11/2022)   No facility-administered encounter medications on file as of 12/11/2022.    Review of Systems  Constitutional:  Negative for activity change, appetite change, chills, diaphoresis, fatigue, fever and unexpected weight change.  HENT:  Negative for congestion.   Respiratory:  Negative for cough, shortness of breath and wheezing.   Cardiovascular:  Negative for chest pain, palpitations and leg swelling.  Gastrointestinal:  Negative for abdominal distention, abdominal pain, constipation and diarrhea.  Genitourinary:  Negative for difficulty urinating and dysuria.  Musculoskeletal:  Positive for back pain and gait problem. Negative for arthralgias, joint swelling and myalgias.  Neurological:  Negative for dizziness, tremors, seizures, syncope, facial asymmetry, speech difficulty, weakness, light-headedness, numbness and headaches.  Psychiatric/Behavioral:  Positive for confusion. Negative for agitation and behavioral problems.     Immunization History  Administered Date(s) Administered   Covid-19, Mrna,Vaccine(Spikevax)89yrs and older 09/21/2022   Fluad Quad(high Dose 65+) 04/05/2020   Influenza Split 03/08/2011, 03/05/2012   Influenza Whole 03/08/2007, 03/02/2009, 03/29/2010    Influenza, High Dose Seasonal PF 03/21/2013, 06/13/2017, 03/07/2019, 03/26/2020   Influenza,inj,Quad PF,6+ Mos 02/20/2014, 02/11/2015, 03/22/2018   Influenza-Unspecified 03/23/2016, 04/05/2020, 03/03/2022   Moderna Covid-19 Vaccine Bivalent Booster 78yrs & up 03/10/2021   Moderna SARS-COV2 Booster Vaccination 09/20/2020   Moderna Sars-Covid-2 Vaccination 06/10/2019, 07/09/2019, 10/13/2021, 04/03/2022   PFIZER(Purple Top)SARS-COV-2 Vaccination 04/05/2020   Pneumococcal Conjugate-13 07/30/2014   Pneumococcal Polysaccharide-23 02/03/2002, 12/16/2009   Td 02/03/2002   Tdap 12/11/2012   Zoster Recombinant(Shingrix) 05/10/2022, 07/11/2022   Pertinent  Health Maintenance Due  Topic Date Due   INFLUENZA VACCINE  12/28/2022   DEXA SCAN  Completed      05/08/2022    1:48 PM 05/18/2022   11:47 AM 05/19/2022   10:07 AM 08/08/2022    1:26 PM 11/20/2022    1:03 PM  Fall Risk  Falls in the past year? 1  1 1 1   Was there an injury with Fall? 0  0 1 1  Fall Risk Category Calculator 2  2 2 3   Fall Risk Category (Retired) Moderate  Moderate    (RETIRED) Patient Fall Risk Level High fall risk High fall risk High fall risk    Patient at Risk for Falls Due to History of fall(s);Impaired balance/gait;Impaired mobility  Impaired balance/gait;Impaired mobility History of fall(s) History of fall(s);Impaired balance/gait;Impaired mobility;Impaired vision  Fall risk Follow up Falls evaluation completed  Falls evaluation completed Falls evaluation completed Falls evaluation completed   Functional Status Survey:    Vitals:   12/11/22 0940  BP: 132/68  Pulse: 62  Resp: 18  Temp: 98.6 F (37 C)  TempSrc: Temporal  SpO2: 96%  Weight: 122 lb 6.4 oz (55.5 kg)  Height: 4\' 11"  (1.499 m)   Body mass index is 24.72 kg/m. Physical Exam Vitals and nursing note reviewed.  Constitutional:      General: She is not in acute distress.    Appearance: She is not diaphoretic.  HENT:     Head: Normocephalic and  atraumatic.  Neck:     Vascular: No JVD.  Pulmonary:     Effort: Pulmonary effort is normal.     Breath sounds: No wheezing.  Musculoskeletal:        General: No swelling, tenderness or deformity.     Right lower leg: No edema.     Left lower leg: No edema.  Skin:    General: Skin is warm and dry.  Neurological:     Mental Status: She is alert. Mental status is at baseline.  Psychiatric:        Mood and Affect: Mood normal.     Labs reviewed: Recent Labs    02/17/22 1010 03/03/22 0000 04/18/22 1345 08/08/22 0000  NA 141 141 143 138  K 3.6 4.1 4.4 4.0  CL 109 102 103 101  CO2 26 27* 25* 26*  GLUCOSE 95  --   --   --   BUN 19 18 14 21   CREATININE 0.49 0.7 0.6 0.6  CALCIUM 8.6* 9.4 10.2 9.4   Recent Labs    03/03/22 0000  AST 20  ALT 14  ALKPHOS 52  ALBUMIN 4.6   Recent Labs    02/17/22 1010 04/18/22 1345 08/08/22 0000  WBC 5.2 6.1 4.9  HGB 11.4* 12.9 11.5*  HCT 35.3* 41 35*  MCV 93.6  --   --   PLT 153 291 157   Lab Results  Component Value Date   TSH 1.39 01/02/2022   No results found for: "HGBA1C" Lab Results  Component Value Date   CHOL 238 (A) 08/20/2018   HDL 67 08/20/2018   LDLCALC 154 08/20/2018   LDLDIRECT 118.7 07/20/2011   TRIG 85 08/20/2018   CHOLHDL 4 12/30/2013    Significant Diagnostic Results in last 30 days:  CT Lumbar Spine Wo Contrast  Result Date: 11/19/2022 CLINICAL DATA:  89 year old female with low back pain EXAM: CT LUMBAR SPINE WITHOUT CONTRAST TECHNIQUE: Multidetector CT imaging of the lumbar spine was performed without intravenous contrast administration. Multiplanar CT image reconstructions were also generated. RADIATION DOSE REDUCTION: This exam was performed according to the departmental dose-optimization program which includes automated exposure control, adjustment of the mA and/or kV according to patient size and/or use of iterative reconstruction technique. COMPARISON:  X-ray 12/29/2016 FINDINGS: Segmentation: 5  lumbar type vertebrae. Alignment: Grade 1 anterolisthesis of L3 on L4, L4 on L5, and L5 on S1. Mild retrolisthesis at T12-L1 and L1-L2. Vertebrae: Posterior instrumented fusion spanning L2-L5. Lumbar vertebral body heights are maintained without evidence of fracture. Bones are demineralized. No lytic or sclerotic bone lesion. Paraspinal and other soft tissues: Hiatal hernia. Aortic atherosclerosis. Colonic diverticulosis. Disc levels: Severe degenerative disc disease of multiple levels including T11-T12, T12-L1, and L1-L2. There is also severe degenerative disc disease at the T9-T10 level seen at the edge of the field of view. IMPRESSION: 1. No acute fracture or traumatic malalignment of the lumbar spine. 2. Posterior instrumented fusion spanning L2-L5. 3. Severe degenerative disc disease of multiple levels including T11-T12, T12-L1, and L1-L2. 4. Aortic atherosclerosis (ICD10-I70.0). Electronically Signed   By: Duanne Guess D.O.   On: 11/19/2022 15:35   CT Chest Wo Contrast  Result Date: 11/19/2022 CLINICAL DATA:  One-week history of left-sided back pain. No known injury. EXAM: CT CHEST WITHOUT CONTRAST TECHNIQUE: Multidetector CT imaging of the chest was performed following the standard protocol without IV contrast. RADIATION DOSE REDUCTION: This exam was performed according to the departmental dose-optimization program which includes automated exposure control, adjustment of the mA and/or kV according to patient size and/or use of iterative reconstruction technique. COMPARISON:  Chest radiograph dated 07/18/2016 FINDINGS: Cardiovascular: Normal heart size. No significant pericardial fluid/thickening. Ascending thoracic aorta measures 4.2 x 4.1 cm. Coronary artery calcifications and aortic atherosclerosis. Mediastinum/Nodes: Imaged thyroid gland without nodules meeting criteria for imaging follow-up by size. Moderate hiatal hernia. No pathologically enlarged axillary, supraclavicular, mediastinal, or hilar  lymph nodes. Lungs/Pleura: The central airways  are patent. Left apical linear atelectasis/scarring. Bibasilar linear scarring with mild traction bronchiectasis. No focal consolidation. No pneumothorax. No pleural effusion. Upper abdomen: 1.0 cm cyst in the posterior hepatic segment 7 (2:108). Colonic diverticulosis without acute diverticulitis. Musculoskeletal: No acute or abnormal lytic or blastic osseous lesions. Multilevel degenerative changes of the thoracic spine. Partially imaged lumbar spinal fixation hardware. IMPRESSION: 1. No acute intrathoracic abnormality. 2. Moderate hiatal hernia. 3. Ascending thoracic aortic aneurysm measuring up to 4.2 cm. Recommend annual imaging followup by CTA or MRA. This recommendation follows 2010 ACCF/AHA/AATS/ACR/ASA/SCA/SCAI/SIR/STS/SVM Guidelines for the Diagnosis and Management of Patients with Thoracic Aortic Disease. Circulation. 2010; 121: Z610-R604. Aortic aneurysm NOS (ICD10-I71.9) 4. Aortic Atherosclerosis (ICD10-I70.0). Coronary artery calcifications. Assessment for potential risk factor modification, dietary therapy or pharmacologic therapy may be warranted, if clinically indicated. Electronically Signed   By: Agustin Cree M.D.   On: 11/19/2022 15:32    Assessment/Plan  1. Acute midline low back pain without sciatica Improved Change oxycodone to 2.5 mg q 6 prn for 14 days   2. Dementia without behavioral disturbance (HCC) Progressive decline in cognition and physical function c/w the disease. Continue supportive care in the skilled environment. Improved with lower dose oxy  3. Essential hypertension Controlled Continue losartan  4. Chronic constipation Continue miralax  5. Senile osteoporosis Last Prolia inj April 2023. Now that she is skilled care will need to consider ins coverage. Remains a fall risk No longer getting BMD done    Family/ staff Communication: nurse and her son  Labs/tests ordered:  NA

## 2022-12-12 DIAGNOSIS — K449 Diaphragmatic hernia without obstruction or gangrene: Secondary | ICD-10-CM | POA: Diagnosis not present

## 2022-12-12 DIAGNOSIS — M6281 Muscle weakness (generalized): Secondary | ICD-10-CM | POA: Diagnosis not present

## 2022-12-12 DIAGNOSIS — F05 Delirium due to known physiological condition: Secondary | ICD-10-CM | POA: Diagnosis not present

## 2022-12-12 DIAGNOSIS — R1312 Dysphagia, oropharyngeal phase: Secondary | ICD-10-CM | POA: Diagnosis not present

## 2022-12-12 DIAGNOSIS — M150 Primary generalized (osteo)arthritis: Secondary | ICD-10-CM | POA: Diagnosis not present

## 2022-12-12 DIAGNOSIS — R2681 Unsteadiness on feet: Secondary | ICD-10-CM | POA: Diagnosis not present

## 2022-12-12 DIAGNOSIS — R41841 Cognitive communication deficit: Secondary | ICD-10-CM | POA: Diagnosis not present

## 2022-12-12 DIAGNOSIS — R278 Other lack of coordination: Secondary | ICD-10-CM | POA: Diagnosis not present

## 2022-12-12 DIAGNOSIS — R4189 Other symptoms and signs involving cognitive functions and awareness: Secondary | ICD-10-CM | POA: Diagnosis not present

## 2022-12-13 ENCOUNTER — Encounter: Payer: Self-pay | Admitting: Adult Health

## 2022-12-13 DIAGNOSIS — F05 Delirium due to known physiological condition: Secondary | ICD-10-CM | POA: Diagnosis not present

## 2022-12-13 DIAGNOSIS — R4189 Other symptoms and signs involving cognitive functions and awareness: Secondary | ICD-10-CM | POA: Diagnosis not present

## 2022-12-13 DIAGNOSIS — R1312 Dysphagia, oropharyngeal phase: Secondary | ICD-10-CM | POA: Diagnosis not present

## 2022-12-13 DIAGNOSIS — K449 Diaphragmatic hernia without obstruction or gangrene: Secondary | ICD-10-CM | POA: Diagnosis not present

## 2022-12-13 DIAGNOSIS — R41841 Cognitive communication deficit: Secondary | ICD-10-CM | POA: Diagnosis not present

## 2022-12-15 DIAGNOSIS — R4189 Other symptoms and signs involving cognitive functions and awareness: Secondary | ICD-10-CM | POA: Diagnosis not present

## 2022-12-15 DIAGNOSIS — R1312 Dysphagia, oropharyngeal phase: Secondary | ICD-10-CM | POA: Diagnosis not present

## 2022-12-15 DIAGNOSIS — K449 Diaphragmatic hernia without obstruction or gangrene: Secondary | ICD-10-CM | POA: Diagnosis not present

## 2022-12-15 DIAGNOSIS — R41841 Cognitive communication deficit: Secondary | ICD-10-CM | POA: Diagnosis not present

## 2022-12-15 DIAGNOSIS — F05 Delirium due to known physiological condition: Secondary | ICD-10-CM | POA: Diagnosis not present

## 2022-12-18 DIAGNOSIS — R4189 Other symptoms and signs involving cognitive functions and awareness: Secondary | ICD-10-CM | POA: Diagnosis not present

## 2022-12-18 DIAGNOSIS — R1312 Dysphagia, oropharyngeal phase: Secondary | ICD-10-CM | POA: Diagnosis not present

## 2022-12-18 DIAGNOSIS — R41841 Cognitive communication deficit: Secondary | ICD-10-CM | POA: Diagnosis not present

## 2022-12-18 DIAGNOSIS — K449 Diaphragmatic hernia without obstruction or gangrene: Secondary | ICD-10-CM | POA: Diagnosis not present

## 2022-12-18 DIAGNOSIS — F05 Delirium due to known physiological condition: Secondary | ICD-10-CM | POA: Diagnosis not present

## 2022-12-20 DIAGNOSIS — R41841 Cognitive communication deficit: Secondary | ICD-10-CM | POA: Diagnosis not present

## 2022-12-20 DIAGNOSIS — F05 Delirium due to known physiological condition: Secondary | ICD-10-CM | POA: Diagnosis not present

## 2022-12-20 DIAGNOSIS — R4189 Other symptoms and signs involving cognitive functions and awareness: Secondary | ICD-10-CM | POA: Diagnosis not present

## 2022-12-20 DIAGNOSIS — K449 Diaphragmatic hernia without obstruction or gangrene: Secondary | ICD-10-CM | POA: Diagnosis not present

## 2022-12-20 DIAGNOSIS — R1312 Dysphagia, oropharyngeal phase: Secondary | ICD-10-CM | POA: Diagnosis not present

## 2022-12-22 ENCOUNTER — Non-Acute Institutional Stay (INDEPENDENT_AMBULATORY_CARE_PROVIDER_SITE_OTHER): Payer: Medicare HMO | Admitting: Adult Health

## 2022-12-22 ENCOUNTER — Encounter: Payer: Self-pay | Admitting: Adult Health

## 2022-12-22 DIAGNOSIS — R1312 Dysphagia, oropharyngeal phase: Secondary | ICD-10-CM | POA: Diagnosis not present

## 2022-12-22 DIAGNOSIS — Z0000 Encounter for general adult medical examination without abnormal findings: Secondary | ICD-10-CM | POA: Diagnosis not present

## 2022-12-22 DIAGNOSIS — R4189 Other symptoms and signs involving cognitive functions and awareness: Secondary | ICD-10-CM | POA: Diagnosis not present

## 2022-12-22 DIAGNOSIS — R41841 Cognitive communication deficit: Secondary | ICD-10-CM | POA: Diagnosis not present

## 2022-12-22 DIAGNOSIS — F05 Delirium due to known physiological condition: Secondary | ICD-10-CM | POA: Diagnosis not present

## 2022-12-22 DIAGNOSIS — K449 Diaphragmatic hernia without obstruction or gangrene: Secondary | ICD-10-CM | POA: Diagnosis not present

## 2022-12-22 NOTE — Patient Instructions (Signed)
[name] , Thank you for taking time to come for your Medicare Wellness Visit. I appreciate your ongoing commitment to your health goals. Please review the following plan we discussed and let me know if I can assist you in the future.   Screening recommendations/referrals: Colonoscopy aged out Mammogram aged out Bone Density would not recommend due to age 89, dementia, skilled care Recommended yearly ophthalmology/optometry visit for glaucoma screening and checkup Recommended yearly dental visit for hygiene and checkup  Vaccinations: Influenza vaccine- due annually in September/October Pneumococcal vaccine up to date Tdap vaccine ordered Shingles vaccine up to date    Advanced directives: reviewed  Conditions/risks identified: fall  Next appointment: 1 year   Preventive Care 6 Years and Older, Female Preventive care refers to lifestyle choices and visits with your health care provider that can promote health and wellness. What does preventive care include? A yearly physical exam. This is also called an annual well check. Dental exams once or twice a year. Routine eye exams. Ask your health care provider how often you should have your eyes checked. Personal lifestyle choices, including: Daily care of your teeth and gums. Regular physical activity. Eating a healthy diet. Avoiding tobacco and drug use. Limiting alcohol use. Practicing safe sex. Taking low-dose aspirin every day. Taking vitamin and mineral supplements as recommended by your health care provider. What happens during an annual well check? The services and screenings done by your health care provider during your annual well check will depend on your age, overall health, lifestyle risk factors, and family history of disease. Counseling  Your health care provider may ask you questions about your: Alcohol use. Tobacco use. Drug use. Emotional well-being. Home and relationship well-being. Sexual  activity. Eating habits. History of falls. Memory and ability to understand (cognition). Work and work Astronomer. Reproductive health. Screening  You may have the following tests or measurements: Height, weight, and BMI. Blood pressure. Lipid and cholesterol levels. These may be checked every 5 years, or more frequently if you are over 74 years old. Skin check. Lung cancer screening. You may have this screening every year starting at age 46 if you have a 30-pack-year history of smoking and currently smoke or have quit within the past 15 years. Fecal occult blood test (FOBT) of the stool. You may have this test every year starting at age 66. Flexible sigmoidoscopy or colonoscopy. You may have a sigmoidoscopy every 5 years or a colonoscopy every 10 years starting at age 63. Hepatitis C blood test. Hepatitis B blood test. Sexually transmitted disease (STD) testing. Diabetes screening. This is done by checking your blood sugar (glucose) after you have not eaten for a while (fasting). You may have this done every 1-3 years. Bone density scan. This is done to screen for osteoporosis. You may have this done starting at age 92. Mammogram. This may be done every 1-2 years. Talk to your health care provider about how often you should have regular mammograms. Talk with your health care provider about your test results, treatment options, and if necessary, the need for more tests. Vaccines  Your health care provider may recommend certain vaccines, such as: Influenza vaccine. This is recommended every year. Tetanus, diphtheria, and acellular pertussis (Tdap, Td) vaccine. You may need a Td booster every 10 years. Zoster vaccine. You may need this after age 91. Pneumococcal 13-valent conjugate (PCV13) vaccine. One dose is recommended after age 56. Pneumococcal polysaccharide (PPSV23) vaccine. One dose is recommended after age 53. Talk to your health  care provider about which screenings and vaccines  you need and how often you need them. This information is not intended to replace advice given to you by your health care provider. Make sure you discuss any questions you have with your health care provider. Document Released: 06/11/2015 Document Revised: 02/02/2016 Document Reviewed: 03/16/2015 Elsevier Interactive Patient Education  2017 [employer].  Fall Prevention in the Home Falls can cause injuries. They can happen to people of all ages. There are many things you can do to make your home safe and to help prevent falls. What can I do on the outside of my home? Regularly fix the edges of walkways and driveways and fix any cracks. Remove anything that might make you trip as you walk through a door, such as a raised step or threshold. Trim any bushes or trees on the path to your home. Use bright outdoor lighting. Clear any walking paths of anything that might make someone trip, such as rocks or tools. Regularly check to see if handrails are loose or broken. Make sure that both sides of any steps have handrails. Any raised decks and porches should have guardrails on the edges. Have any leaves, snow, or ice cleared regularly. Use sand or salt on walking paths during winter. Clean up any spills in your garage right away. This includes oil or grease spills. What can I do in the bathroom? Use night lights. Install grab bars by the toilet and in the tub and shower. Do not use towel bars as grab bars. Use non-skid mats or decals in the tub or shower. If you need to sit down in the shower, use a plastic, non-slip stool. Keep the floor dry. Clean up any water that spills on the floor as soon as it happens. Remove soap buildup in the tub or shower regularly. Attach bath mats securely with double-sided non-slip rug tape. Do not have throw rugs and other things on the floor that can make you trip. What can I do in the bedroom? Use night lights. Make sure that you have a light by your bed that  is easy to reach. Do not use any sheets or blankets that are too big for your bed. They should not hang down onto the floor. Have a firm chair that has side arms. You can use this for support while you get dressed. Do not have throw rugs and other things on the floor that can make you trip. What can I do in the kitchen? Clean up any spills right away. Avoid walking on wet floors. Keep items that you use a lot in easy-to-reach places. If you need to reach something above you, use a strong step stool that has a grab bar. Keep electrical cords out of the way. Do not use floor polish or wax that makes floors slippery. If you must use wax, use non-skid floor wax. Do not have throw rugs and other things on the floor that can make you trip. What can I do with my stairs? Do not leave any items on the stairs. Make sure that there are handrails on both sides of the stairs and use them. Fix handrails that are broken or loose. Make sure that handrails are as long as the stairways. Check any carpeting to make sure that it is firmly attached to the stairs. Fix any carpet that is loose or worn. Avoid having throw rugs at the top or bottom of the stairs. If you do have throw rugs, attach them to  the floor with carpet tape. Make sure that you have a light switch at the top of the stairs and the bottom of the stairs. If you do not have them, ask someone to add them for you. What else can I do to help prevent falls? Wear shoes that: Do not have high heels. Have rubber bottoms. Are comfortable and fit you well. Are closed at the toe. Do not wear sandals. If you use a stepladder: Make sure that it is fully opened. Do not climb a closed stepladder. Make sure that both sides of the stepladder are locked into place. Ask someone to hold it for you, if possible. Clearly mark and make sure that you can see: Any grab bars or handrails. First and last steps. Where the edge of each step is. Use tools that help you  move around (mobility aids) if they are needed. These include: Canes. Walkers. Scooters. Crutches. Turn on the lights when you go into a dark area. Replace any light bulbs as soon as they burn out. Set up your furniture so you have a clear path. Avoid moving your furniture around. If any of your floors are uneven, fix them. If there are any pets around you, be aware of where they are. Review your medicines with your doctor. Some medicines can make you feel dizzy. This can increase your chance of falling. Ask your doctor what other things that you can do to help prevent falls. This information is not intended to replace advice given to you by your health care provider. Make sure you discuss any questions you have with your health care provider. Document Released: 03/11/2009 Document Revised: 10/21/2015 Document Reviewed: 06/19/2014 Elsevier Interactive Patient Education  2017 [employer].

## 2022-12-22 NOTE — Progress Notes (Signed)
Subjective:   Vanessa Mason is a 89 y.o. female who presents for Medicare Annual (Subsequent) preventive examination at [facility]  Visit Complete: In person  Patient Medicare AWV questionnaire was completed by the patient on (pt not able to complete done by provider); I have confirmed that all information answered by patient is correct and no changes since this date.  Review of Systems     Cardiac Risk Factors include: advanced age (>47men, >80 women)     Objective:    Today's Vitals   12/22/22 1001  Weight: 122 lb (55.3 kg)   Body mass index is 24.64 kg/m.     12/22/2022    9:57 AM 12/11/2022    9:45 AM 11/20/2022    1:02 PM 11/18/2022    2:29 PM 11/14/2022    3:57 PM 11/08/2022    2:01 PM 11/07/2022    9:24 AM  Advanced Directives  Does Patient Have a Medical Advance Directive? Yes Yes Yes Yes Yes Yes Yes  Type of Estate agent of [place];Out of facility DNR (pink MOST or yellow form);Living will Healthcare Power of [place];Living will;Out of facility DNR (pink MOST or yellow form) Healthcare Power of [place];Living will;Out of facility DNR (pink MOST or yellow form) Healthcare Power of [place];Living will Healthcare Power of [place];Living will;Out of facility DNR (pink MOST or yellow form) Healthcare Power of [place];Living will;Out of facility DNR (pink MOST or yellow form) Healthcare Power of [place];Living will;Out of facility DNR (pink MOST or yellow form)  Does patient want to make changes to medical advance directive?  No - Patient declined No - Patient declined No - Patient declined No - Patient declined No - Patient declined No - Patient declined  Copy of Healthcare Power of Attorney in Chart? Yes - validated most recent copy scanned in chart (See row information) Yes - validated most recent copy scanned in chart (See row information) Yes - validated most recent copy scanned in chart (See row  information) No - copy requested Yes - validated most recent copy scanned in chart (See row information) Yes - validated most recent copy scanned in chart (See row information) Yes - validated most recent copy scanned in chart (See row information)  Pre-existing out of facility DNR order (yellow form or pink MOST form) Yellow form placed in chart (order not valid for inpatient use);Pink MOST form placed in chart (order not valid for inpatient use) Pink MOST form placed in chart (order not valid for inpatient use) Pink MOST form placed in chart (order not valid for inpatient use)        Current Medications (verified) Outpatient Encounter Medications as of 12/22/2022  Medication Sig   acetaminophen (TYLENOL) 500 MG tablet Take 1,000 mg by mouth 3 (three) times daily.   Ascorbic Acid (VITAMIN C) 1000 MG tablet Take 1,000 mg by mouth daily.   Carboxymethylcellulose Sodium (THERATEARS) 0.25 % SOLN Apply 2-3 drops to eye in the morning, at noon, in the evening, and at bedtime.   Cholecalciferol (VITAMIN D3) 50 MCG (2000 UT) TABS Take 1 tablet by mouth daily.   DULoxetine (CYMBALTA) 20 MG capsule Take 1 capsule (20 mg total) by mouth daily.   hydrALAZINE (APRESOLINE) 10 MG tablet Take 10 mg by mouth 2 (two) times daily as needed.   lidocaine 4 % Place 2 patches onto the skin every morning. Apply 2 patches to middles and right back. (Patient not taking: Reported on 12/11/2022)   LORazepam (ATIVAN)  0.5 MG tablet Take 1 tablet (0.5 mg total) by mouth every 6 (six) hours as needed for anxiety.   losartan (COZAAR) 50 MG tablet Take 1.5 tablets (75 mg total) by mouth daily.   methocarbamol (ROBAXIN) 500 MG tablet Take 500 mg by mouth every 6 (six) hours as needed for muscle spasms.   Multiple Vitamins-Minerals (PRESERVISION AREDS 2+MULTI VIT PO) Take by mouth 2 (two) times daily. (Patient not taking: Reported on 12/11/2022)   Omega-3 1000 MG CAPS Take 2,000 mg by mouth 2 (two) times daily. (Patient not taking:  Reported on 12/11/2022)   oxyCODONE (ROXICODONE) 5 MG immediate release tablet Take 0.5 tablets (2.5 mg total) by mouth 2 (two) times daily.   oxymetazoline (AFRIN NASAL SPRAY) 0.05 % nasal spray Place 2 sprays into both nostrils daily as needed (nose bleed).   polyethylene glycol powder (GLYCOLAX/MIRALAX) 17 GM/SCOOP powder Take 17 g by mouth daily as needed.   Pregnenolone 50 MG TABS Take 1 tablet by mouth every morning.   sennosides-docusate sodium (SENOKOT-S) 8.6-50 MG tablet Take 2 tablets by mouth daily.   No facility-administered encounter medications on file as of 12/22/2022.    Allergies (verified) Naproxen, Nsaids, Aspirin, Ace inhibitors, and Caffeine   History: Past Medical History:  Diagnosis Date   Arthritis    "all over"   BACK PAIN 03/08/2007   Cancer (HCC)    squamous cell on R leg & face- ?basal cell   Cutaneous abscess of right lower limb    DEPRESSIVE DISORDER 12/01/2008   Disruption of external operation (surgical) wound, not elsewhere classified, initial encounter    Duodenal ulcer    Essential (primary) hypertension    GI bleed 09/12/2010   Naproxen induced Endoscopy with dr Marjorie Smolder    HIATAL HERNIA 12/17/2006   History of hiatal hernia    HYPERLIPIDEMIA 03/09/2006   Hyperlipidemia, unspecified    HYPERTENSION 03/09/2006   KNEE PAIN 05/06/2009   Macular degeneration    Major depressive disorder, single episode, unspecified    Pneumonia    58yrs. ago- hosp. pneumonia   Urinary urgency    Past Surgical History:  Procedure Laterality Date   ABDOMINAL HYSTERECTOMY  1975   APPLICATION OF A-CELL OF EXTREMITY Right 12/10/2014   Procedure: APPLICATION OF A-CELL OF EXTREMITY;  Surgeon: Wayland Denis, DO;  Location: [facility] OR;  Service: Plastics;  Laterality: Right;   arthroscopic knee Right    x 2   BACK SURGERY  2000   spinal fusion   BILATERAL SALPINGECTOMY Bilateral 1989   Dr. Gavin Potters   CARDIAC CATHETERIZATION     EYE SURGERY     cataracts bilateral -  removed   HERNIA REPAIR Bilateral    inguinal    I & D EXTREMITY Right 12/10/2014   Procedure: IRRIGATION AND DEBRIDEMENT ON RIGHT LEG, ;  Surgeon: Wayland Denis, DO;  Location: [facility] OR;  Service: Plastics;  Laterality: Right;   I & D EXTREMITY Right 01/20/2015   Procedure: IRRIGATION AND DEBRIDEMENT RIGHT LOWER LEG WOUND, with ACELL to Donor Site, SKIN GRAFT AND VAC Placement;  Surgeon: Wayland Denis, DO;  Location: [facility] OR;  Service: Plastics;  Laterality: Right;   LUMBAR LAMINECTOMY  02-17-1999   with sinal fusion L3,4,5,&S1   moes     moses surgery on R lle   OOPHORECTOMY  1989   RESECTION DISTAL CLAVICAL     ROTATOR CUFF REPAIR Right 2001   x2   SKIN GRAFT Right 05/29/2018   THYROIDECTOMY  TONSILLECTOMY  1928   TUBAL LIGATION     Family History  Problem Relation Age of Onset   Cancer Mother 76   Cancer Brother    Social History   Socioeconomic History   Marital status: Widowed    Spouse name: Not on file   Number of children: 5   Years of education: Not on file   Highest education level: Not on file  Occupational History   Not on file  Tobacco Use   Smoking status: Former    Types: Cigarettes   Smokeless tobacco: Never   Tobacco comments:    Quit at age 17  Vaping Use   Vaping status: Never Used  Substance and Sexual Activity   Alcohol use: Yes    Alcohol/week: 7.0 standard drinks of alcohol    Types: 7 Glasses of wine per week    Comment: wine occasionally @ dinner   Drug use: No   Sexual activity: Not on file  Other Topics Concern   Not on file  Social History Narrative   ** Merged History Encounter **       Lives at [facility] since 09/1992 Widow Never smoked Alcohol wine at night Exercise classes 3-5 times a week  POA, DNR, Living Will    Social Determinants of Health   Financial Resource Strain: Not on file  Food Insecurity: Not on file  Transportation Needs: Not on file  Physical Activity: Not on file  Stress: Not on file  Social  Connections: Not on file    Tobacco Counseling Counseling given: Not Answered Tobacco comments: Quit at age 26   Clinical Intake:  Pre-visit preparation completed: No  Pain : No/denies pain     BMI - recorded: 24.6 Nutritional Status: BMI of 19-24  Normal Nutritional Risks: None Diabetes: No  How often do you need to have someone help you when you read instructions, pamphlets, or other written materials from your doctor or pharmacy?: 4 - Often What is the last grade level you completed in school?: graduate study, child development     Information entered by :: Fletcher Anon NP   Activities of Daily Living    12/22/2022   10:06 AM 12/22/2022    9:57 AM  In your present state of health, do you have any difficulty performing the following activities:  Hearing? 1 1  Vision? 1 1  Difficulty concentrating or making decisions? 1 1  Walking or climbing stairs? 1 1  Dressing or bathing? 1 1  Doing errands, shopping? 1 1  Preparing Food and eating ?  Y  Using the Toilet?  Y  In the past six months, have you accidently leaked urine?  Y  Do you have problems with loss of bowel control?  N  Managing your Medications?  Y  Managing your Finances?  Y  Housekeeping or managing your Housekeeping?  Y    Patient Care Team: Mahlon Gammon, MD as PCP - General (Internal Medicine) Bernette Redbird, MD as Consulting Physician (Gastroenterology) Dannielle Huh, MD as Consulting Physician (Orthopedic Surgery) Ellen Henri as Consulting Physician (Dermatology) Cassell Clement, MD as Consulting Physician (Cardiology) Maris Berger, MD as Consulting Physician (Ophthalmology) Glennis Brink, MD as Referring Physician (Dermatology)  Indicate any recent Medical Services you may have received from other than [facility] providers in the past year (date may be approximate).     Assessment:   This is a routine wellness examination for [place].  Hearing/Vision screen No results  found.  Dietary issues and  exercise activities discussed:     Goals Addressed             This Visit's Progress    Maintain Mobility and Function       Evidence-based guidance:  Acknowledge and validate impact of pain, loss of strength and potential disfigurement (hand osteoarthritis) on mental health and daily life, such as social isolation, anxiety, depression, impaired sexual relationship and   injury from falls.  Anticipate referral to physical or occupational therapy for assessment, therapeutic exercise and recommendation for adaptive equipment or assistive devices; encourage participation.  Assess impact on ability to perform activities of daily living, as well as engage in sports and leisure events or requirements of work or school.  Provide anticipatory guidance and reassurance about the benefit of exercise to maintain function; acknowledge and normalize fear that exercise may worsen symptoms.  Encourage regular exercise, at least 10 minutes at a time for 45 minutes per week; consider yoga, water exercise and proprioceptive exercises; encourage use of wearable activity tracker to increase motivation and adherence.  Encourage maintenance or resumption of daily activities, including employment, as pain allows and with minimal exposure to trauma.  Assist patient to advocate for adaptations to the work environment.  Consider level of pain and function, gender, age, lifestyle, patient preference, quality of life, readiness and ?ocapacity to benefit? when recommending patients for orthopaedic surgery consultation.  Explore strategies, such as changes to medication regimen or activity that enables patient to anticipate and manage flare-ups that increase deconditioning and disability.  Explore patient preferences; encourage exposure to a broader range of activities that have been avoided for fear of experiencing pain.  Identify barriers to participation in therapy or exercise, such as  pain with activity, anticipated or imagined pain.  Monitor postoperative joint replacement or any preexisting joint replacement for ongoing pain and loss of function; provide social support and encouragement throughout recovery.   Notes:        Depression Screen    12/22/2022   10:07 AM 05/19/2022   10:07 AM 05/08/2022    1:49 PM 11/14/2021    3:02 PM 10/11/2021    1:25 PM 05/19/2020    1:47 PM 04/06/2020    9:32 AM  PHQ 2/9 Scores  PHQ - 2 Score 0 0 0  0 0 0  Exception Documentation    Other- indicate reason in comment box     Not completed    Patient states there is nothing that can be done about her depression.       Fall Risk    12/22/2022    9:56 AM 11/20/2022    1:03 PM 08/08/2022    1:26 PM 05/19/2022   10:07 AM 05/08/2022    1:48 PM  Fall Risk   Falls in the past year? 1 1 1 1 1   Number falls in past yr: 1 1 0 1 1  Injury with Fall? 1 1 1  0 0  Risk for fall due to : History of fall(s);Impaired balance/gait History of fall(s);Impaired balance/gait;Impaired mobility;Impaired vision History of fall(s) Impaired balance/gait;Impaired mobility History of fall(s);Impaired balance/gait;Impaired mobility  Follow up Falls evaluation completed Falls evaluation completed Falls evaluation completed Falls evaluation completed Falls evaluation completed    MEDICARE RISK AT HOME:  Medicare Risk at Home - 12/22/22 1006     Any stairs in or around the home? No    If so, are there any without handrails? No    Home free of loose throw rugs in walkways, pet  beds, electrical cords, etc? Yes    Adequate lighting in your home to reduce risk of falls? Yes    Life alert? No    Use of a cane, walker or w/c? Yes    Grab bars in the bathroom? Yes    Shower chair or bench in shower? Yes    Elevated toilet seat or a handicapped toilet? Yes             TIMED UP AND GO:  Was the test performed?  No    Cognitive Function:    12/22/2022    9:56 AM 07/17/2017   12:05 PM 06/07/2016   10:37  AM  MMSE - Mini Mental State Exam  Orientation to time 2 5 5   Orientation to Place 2 5 5   Registration 3 3 3   Attention/ Calculation 0 5 5  Recall 0 2 1  Language- name 2 objects 1 2 2   Language- repeat 1 1 1   Language- follow 3 step command 1 3 3   Language- read & follow direction 0 1 1  Write a sentence 0 1 1  Copy design 0 1 1  Total score 10 29 28         10/11/2021    1:30 PM 04/06/2020    9:48 AM 04/03/2019    1:41 PM  6CIT Screen  What Year? 0 points 0 points 0 points  What month? 0 points 0 points 0 points  What time? 3 points 0 points 0 points  Count back from 20 0 points 0 points 0 points  Months in reverse 0 points 0 points 0 points  Repeat phrase 0 points 2 points 2 points  Total Score 3 points 2 points 2 points    Immunizations Immunization History  Administered Date(s) Administered   Covid-19, Mrna,Vaccine(Spikevax)1yrs and older 09/21/2022   Fluad Quad(high Dose 65+) 04/05/2020   Influenza Split 03/08/2011, 03/05/2012   Influenza Whole 03/08/2007, 03/02/2009, 03/29/2010   Influenza, High Dose Seasonal PF 03/21/2013, 06/13/2017, 03/07/2019, 03/26/2020   Influenza,inj,Quad PF,6+ Mos 02/20/2014, 02/11/2015, 03/22/2018   Influenza-Unspecified 03/23/2016, 04/05/2020, 03/03/2022   Moderna Covid-19 Vaccine Bivalent Booster 21yrs & up 03/10/2021   Moderna SARS-COV2 Booster Vaccination 09/20/2020   Moderna Sars-Covid-2 Vaccination 06/10/2019, 07/09/2019, 10/13/2021, 04/03/2022   PFIZER(Purple Top)SARS-COV-2 Vaccination 04/05/2020   Pneumococcal Conjugate-13 07/30/2014   Pneumococcal Polysaccharide-23 02/03/2002, 12/16/2009   Td 02/03/2002   Tdap 12/11/2012   Zoster Recombinant(Shingrix) 05/10/2022, 07/11/2022    TDAP status: Due, Education has been provided regarding the importance of this vaccine. Advised may receive this vaccine at local pharmacy or Health Dept. Aware to provide a copy of the vaccination record if obtained from local pharmacy or Health Dept.  Verbalized acceptance and understanding.  Flu Vaccine status: Up to date  Pneumococcal vaccine status: Up to date  Covid-19 vaccine status: Completed vaccines  Qualifies for Shingles Vaccine? Yes   Zostavax completed Yes   Shingrix Completed?: Yes  Screening Tests Health Maintenance  Topic Date Due   DTaP/Tdap/Td (3 - Td or Tdap) 12/12/2022   COVID-19 Vaccine (8 - 2023-24 season) 01/06/2023 (Originally 11/16/2022)   INFLUENZA VACCINE  12/28/2022   Medicare Annual Wellness (AWV)  12/22/2023   Pneumonia Vaccine 29+ Years old  Completed   DEXA SCAN  Completed   Zoster Vaccines- Shingrix  Completed   HPV VACCINES  Aged Out    Health Maintenance  Health Maintenance Due  Topic Date Due   DTaP/Tdap/Td (3 - Td or Tdap) 12/12/2022    Colorectal cancer  screening: No longer required.   Mammogram status: No longer required due to aged.  Bone Density status: Completed 04/15/19. Results reflect: Bone density results: OSTEOPOROSIS. Repeat every would not repeat due to age, skilled care status, dementia years.  Lung Cancer Screening: (Low Dose CT Chest recommended if Age 62-80 years, 20 pack-year currently smoking OR have quit w/in 15years.) does not qualify.   Lung Cancer Screening Referral: na  Additional Screening:  Hepatitis C Screening: does not qualify; Completed NA  Vision Screening: Recommended annual ophthalmology exams for early detection of glaucoma and other disorders of the eye. Is the patient up to date with their annual eye exam?  Yes  Who is the provider or what is the name of the office in which the patient attends annual eye exams? Govind MD 08/28/22 If pt is not established with a provider, would they like to be referred to a provider to establish care? No .   Dental Screening: Recommended annual dental exams for proper oral hygiene  Diabetic Foot Exam: Diabetic Foot Exam: Completed not indicated  Community Resource Referral / Chronic Care Management: CRR  required this visit?  No   CCM required this visit?  No     Plan:     I have personally reviewed and noted the following in the patient's chart:   Medical and social history Use of alcohol, tobacco or illicit drugs  Current medications and supplements including opioid prescriptions. Patient is currently taking opioid prescriptions. Information provided to patient regarding non-opioid alternatives. Patient advised to discuss non-opioid treatment plan with their provider. Functional ability and status Nutritional status Physical activity Advanced directives List of other physicians Hospitalizations, surgeries, and ER visits in previous 12 months Vitals Screenings to include cognitive, depression, and falls Referrals and appointments  In addition, I have reviewed and discussed with patient certain preventive protocols, quality metrics, and best practice recommendations. A written personalized care plan for preventive services as well as general preventive health recommendations were provided to patient.     Fletcher Anon, NP   12/22/2022   After Visit Summary: [facility] to [facility] Nurse Notes: NA

## 2022-12-25 DIAGNOSIS — F05 Delirium due to known physiological condition: Secondary | ICD-10-CM | POA: Diagnosis not present

## 2022-12-25 DIAGNOSIS — R1312 Dysphagia, oropharyngeal phase: Secondary | ICD-10-CM | POA: Diagnosis not present

## 2022-12-25 DIAGNOSIS — R4189 Other symptoms and signs involving cognitive functions and awareness: Secondary | ICD-10-CM | POA: Diagnosis not present

## 2022-12-25 DIAGNOSIS — R41841 Cognitive communication deficit: Secondary | ICD-10-CM | POA: Diagnosis not present

## 2022-12-25 DIAGNOSIS — K449 Diaphragmatic hernia without obstruction or gangrene: Secondary | ICD-10-CM | POA: Diagnosis not present

## 2023-01-09 ENCOUNTER — Non-Acute Institutional Stay (SKILLED_NURSING_FACILITY): Payer: Medicare HMO | Admitting: Orthopedic Surgery

## 2023-01-09 ENCOUNTER — Encounter: Payer: Self-pay | Admitting: Orthopedic Surgery

## 2023-01-09 DIAGNOSIS — I700 Atherosclerosis of aorta: Secondary | ICD-10-CM

## 2023-01-09 DIAGNOSIS — D692 Other nonthrombocytopenic purpura: Secondary | ICD-10-CM | POA: Diagnosis not present

## 2023-01-09 DIAGNOSIS — K5909 Other constipation: Secondary | ICD-10-CM | POA: Diagnosis not present

## 2023-01-09 DIAGNOSIS — M810 Age-related osteoporosis without current pathological fracture: Secondary | ICD-10-CM | POA: Diagnosis not present

## 2023-01-09 DIAGNOSIS — H9193 Unspecified hearing loss, bilateral: Secondary | ICD-10-CM | POA: Diagnosis not present

## 2023-01-09 DIAGNOSIS — F0390 Unspecified dementia without behavioral disturbance: Secondary | ICD-10-CM | POA: Diagnosis not present

## 2023-01-09 DIAGNOSIS — M545 Low back pain, unspecified: Secondary | ICD-10-CM

## 2023-01-09 DIAGNOSIS — G8929 Other chronic pain: Secondary | ICD-10-CM | POA: Diagnosis not present

## 2023-01-09 DIAGNOSIS — I10 Essential (primary) hypertension: Secondary | ICD-10-CM

## 2023-01-09 NOTE — Progress Notes (Signed)
Location:  [facility] Room Number: 135/A Place of Service:  SNF [phone]) Provider:  Octavia Heir, NP   Mahlon Gammon, MD  Patient Care Team: Mahlon Gammon, MD as PCP - General (Internal Medicine) Bernette Redbird, MD as Consulting Physician (Gastroenterology) Dannielle Huh, MD as Consulting Physician (Orthopedic Surgery) Ellen Henri as Consulting Physician (Dermatology) Cassell Clement, MD as Consulting Physician (Cardiology) Maris Berger, MD as Consulting Physician (Ophthalmology) Glennis Brink, MD as Referring Physician (Dermatology)  Extended Emergency Contact Information Primary Emergency Contact: Berton Mount of [place] Home Phone: [phone] Relation: Daughter Secondary Emergency Contact: [facility] Address: [phone] [address], [place] [address] Darden Amber of [place] Home Phone: [phone] Mobile Phone: [phone] Relation: Son  Code Status:  DNR Goals of care: Advanced Directive information    12/22/2022    9:57 AM  Advanced Directives  Does Patient Have a Medical Advance Directive? Yes  Type of Estate agent of [place];Out of facility DNR (pink MOST or yellow form);Living will  Copy of Healthcare Power of Attorney in Chart? Yes - validated most recent copy scanned in chart (See row information)  Pre-existing out of facility DNR order (yellow form or pink MOST form) Yellow form placed in chart (order not valid for inpatient use);Pink MOST form placed in chart (order not valid for inpatient use)     Chief Complaint  Patient presents with   Medical Management of Chronic Issues    HPI:  Pt is a 89 y.o. female seen today for medical management of chronic diseases.    She currently resides on the skilled nursing unit at [facility]. PMH: HTN, HLD, LBBB, hiatal hernia, constipation, cognitive impairment, OA, osteoporosis, and depression.   HTN- BUN/creat 21/0.6  08/08/2022, remains on losartan and hydralazine prn for SBP> 180, see trends below Dementia- MMSE 10/30 10/2022, increased confusion with narcotic pain medication, no behaviors, ambulates with walker, not on medication Chronic back pain- h/o spinal fusion L2-L5, 11/19/2022 CT lumbar spine severe degenerative disc disease to T11-T12, T12-L1 and L1-L2, remains on Cymbalta/ tylenol/robaxin Osteoporosis- DEXA 2020, t score -4.5, past use of Prolia 08/2021 Chronic constipation- remains on senna and miralax prn HOH- followed by ENT, cerumen impaction 10/2022, bilateral hearing aid use  Recent blood pressures:  08/07- 145/83  07/27- 145/84  07/24- 156/71  Recent weights:  08/01- 126.6 lbs  06/18- 122.4 lbs  05/01- 124.6 lbs     Past Medical History:  Diagnosis Date   Arthritis    "all over"   BACK PAIN 03/08/2007   Cancer (HCC)    squamous cell on R leg & face- ?basal cell   Cutaneous abscess of right lower limb    DEPRESSIVE DISORDER 12/01/2008   Disruption of external operation (surgical) wound, not elsewhere classified, initial encounter    Duodenal ulcer    Essential (primary) hypertension    GI bleed 09/12/2010   Naproxen induced Endoscopy with dr Marjorie Smolder    HIATAL HERNIA 12/17/2006   History of hiatal hernia    HYPERLIPIDEMIA 03/09/2006   Hyperlipidemia, unspecified    HYPERTENSION 03/09/2006   KNEE PAIN 05/06/2009   Macular degeneration    Major depressive disorder, single episode, unspecified    Pneumonia    72yrs. ago- hosp. pneumonia   Urinary urgency    Past Surgical History:  Procedure Laterality Date   ABDOMINAL HYSTERECTOMY  1975   APPLICATION OF A-CELL OF EXTREMITY Right 12/10/2014  Procedure: APPLICATION OF A-CELL OF EXTREMITY;  Surgeon: Wayland Denis, DO;  Location: [facility] OR;  Service: Plastics;  Laterality: Right;   arthroscopic knee Right    x 2   BACK SURGERY  2000   spinal fusion   BILATERAL SALPINGECTOMY Bilateral 1989   Dr. Gavin Potters   CARDIAC  CATHETERIZATION     EYE SURGERY     cataracts bilateral - removed   HERNIA REPAIR Bilateral    inguinal    I & D EXTREMITY Right 12/10/2014   Procedure: IRRIGATION AND DEBRIDEMENT ON RIGHT LEG, ;  Surgeon: Wayland Denis, DO;  Location: [facility] OR;  Service: Plastics;  Laterality: Right;   I & D EXTREMITY Right 01/20/2015   Procedure: IRRIGATION AND DEBRIDEMENT RIGHT LOWER LEG WOUND, with ACELL to Donor Site, SKIN GRAFT AND VAC Placement;  Surgeon: Wayland Denis, DO;  Location: [facility] OR;  Service: Plastics;  Laterality: Right;   LUMBAR LAMINECTOMY  02-17-1999   with sinal fusion L3,4,5,&S1   moes     moses surgery on R lle   OOPHORECTOMY  1989   RESECTION DISTAL CLAVICAL     ROTATOR CUFF REPAIR Right 2001   x2   SKIN GRAFT Right 05/29/2018   THYROIDECTOMY     TONSILLECTOMY  1928   TUBAL LIGATION      Allergies  Allergen Reactions   Naproxen     Gi bleed   Nsaids     GI BLEED   Aspirin     GI bleed   Ace Inhibitors     Cough    Caffeine Other (See Comments)    Causes joints to be painful    Outpatient Encounter Medications as of 01/09/2023  Medication Sig   acetaminophen (TYLENOL) 500 MG tablet Take 1,000 mg by mouth 3 (three) times daily.   Ascorbic Acid (VITAMIN C) 1000 MG tablet Take 1,000 mg by mouth daily.   Carboxymethylcellulose Sodium (THERATEARS) 0.25 % SOLN Apply 2-3 drops to eye in the morning, at noon, in the evening, and at bedtime.   Cholecalciferol (VITAMIN D3) 50 MCG (2000 UT) TABS Take 1 tablet by mouth daily.   DULoxetine (CYMBALTA) 20 MG capsule Take 1 capsule (20 mg total) by mouth daily.   hydrALAZINE (APRESOLINE) 10 MG tablet Take 10 mg by mouth 2 (two) times daily as needed.   lidocaine 4 % Place 2 patches onto the skin every morning. Apply 2 patches to middles and right back. (Patient not taking: Reported on 12/11/2022)   LORazepam (ATIVAN) 0.5 MG tablet Take 1 tablet (0.5 mg total) by mouth every 6 (six) hours as needed for anxiety.   losartan (COZAAR) 50 MG  tablet Take 1.5 tablets (75 mg total) by mouth daily.   methocarbamol (ROBAXIN) 500 MG tablet Take 500 mg by mouth every 6 (six) hours as needed for muscle spasms.   Multiple Vitamins-Minerals (PRESERVISION AREDS 2+MULTI VIT PO) Take by mouth 2 (two) times daily. (Patient not taking: Reported on 12/11/2022)   Omega-3 1000 MG CAPS Take 2,000 mg by mouth 2 (two) times daily. (Patient not taking: Reported on 12/11/2022)   oxymetazoline (AFRIN NASAL SPRAY) 0.05 % nasal spray Place 2 sprays into both nostrils daily as needed (nose bleed).   polyethylene glycol powder (GLYCOLAX/MIRALAX) 17 GM/SCOOP powder Take 17 g by mouth daily as needed.   Pregnenolone 50 MG TABS Take 1 tablet by mouth every morning.   sennosides-docusate sodium (SENOKOT-S) 8.6-50 MG tablet Take 2 tablets by mouth daily.   No facility-administered encounter  medications on file as of 01/09/2023.    Review of Systems  Unable to perform ROS: Dementia    Immunization History  Administered Date(s) Administered   Covid-19, Mrna,Vaccine(Spikevax)50yrs and older 09/21/2022   Fluad Quad(high Dose 65+) 04/05/2020   Influenza Split 03/08/2011, 03/05/2012   Influenza Whole 03/08/2007, 03/02/2009, 03/29/2010   Influenza, High Dose Seasonal PF 03/21/2013, 06/13/2017, 03/07/2019, 03/26/2020   Influenza,inj,Quad PF,6+ Mos 02/20/2014, 02/11/2015, 03/22/2018   Influenza-Unspecified 03/23/2016, 04/05/2020, 03/03/2022   Moderna Covid-19 Vaccine Bivalent Booster 96yrs & up 03/10/2021   Moderna SARS-COV2 Booster Vaccination 09/20/2020   Moderna Sars-Covid-2 Vaccination 06/10/2019, 07/09/2019, 10/13/2021, 04/03/2022   PFIZER(Purple Top)SARS-COV-2 Vaccination 04/05/2020   Pneumococcal Conjugate-13 07/30/2014   Pneumococcal Polysaccharide-23 02/03/2002, 12/16/2009   Td 02/03/2002   Tdap 12/11/2012   Zoster Recombinant(Shingrix) 05/10/2022, 07/11/2022   Pertinent  Health Maintenance Due  Topic Date Due   INFLUENZA VACCINE  12/28/2022   DEXA  SCAN  Completed      05/18/2022   11:47 AM 05/19/2022   10:07 AM 08/08/2022    1:26 PM 11/20/2022    1:03 PM 12/22/2022    9:56 AM  Fall Risk  Falls in the past year?  1 1 1 1   Was there an injury with Fall?  0 1 1 1   Fall Risk Category Calculator  2 2 3 3   Fall Risk Category (Retired)  Moderate     (RETIRED) Patient Fall Risk Level High fall risk High fall risk     Patient at Risk for Falls Due to  Impaired balance/gait;Impaired mobility History of fall(s) History of fall(s);Impaired balance/gait;Impaired mobility;Impaired vision History of fall(s);Impaired balance/gait  Fall risk Follow up  Falls evaluation completed Falls evaluation completed Falls evaluation completed Falls evaluation completed   Functional Status Survey:    Vitals:   01/09/23 1424  BP: (!) 145/83  Pulse: 82  Resp: 18  Temp: (!) 97.3 F (36.3 C)  SpO2: 95%  Weight: 126 lb 9.6 oz (57.4 kg)  Height: 4\' 11"  (1.499 m)   Body mass index is 25.57 kg/m. Physical Exam Vitals reviewed.  Constitutional:      General: She is not in acute distress. HENT:     Head: Normocephalic.     Right Ear: There is no impacted cerumen.     Left Ear: There is no impacted cerumen.     Ears:     Comments: Bilateral hearing aids    Nose: Nose normal.     Mouth/Throat:     Mouth: Mucous membranes are moist.  Eyes:     General:        Right eye: No discharge.        Left eye: No discharge.  Cardiovascular:     Rate and Rhythm: Normal rate and regular rhythm.     Pulses: Normal pulses.     Heart sounds: Normal heart sounds.  Pulmonary:     Effort: Pulmonary effort is normal. No respiratory distress.     Breath sounds: Normal breath sounds. No wheezing.  Abdominal:     General: Bowel sounds are normal. There is no distension.     Palpations: Abdomen is soft.     Tenderness: There is no abdominal tenderness.  Musculoskeletal:     Cervical back: Neck supple.     Right lower leg: No edema.     Left lower leg: No edema.   Skin:    General: Skin is warm and dry.     Capillary Refill: Capillary refill takes less  than 2 seconds.     Comments: Senile purpura to upper extremities, vary in size, no skin breakdown.   Neurological:     General: No focal deficit present.     Mental Status: She is alert. Mental status is at baseline.     Motor: Weakness present.     Gait: Gait abnormal.     Comments: rolator  Psychiatric:        Mood and Affect: Mood normal.     Comments: Very pleasant, word finding/aphasia, alert to self/person     Labs reviewed: Recent Labs    02/17/22 1010 03/03/22 0000 04/18/22 1345 08/08/22 0000  NA 141 141 143 138  K 3.6 4.1 4.4 4.0  CL 109 102 103 101  CO2 26 27* 25* 26*  GLUCOSE 95  --   --   --   BUN 19 18 14 21   CREATININE 0.49 0.7 0.6 0.6  CALCIUM 8.6* 9.4 10.2 9.4   Recent Labs    03/03/22 0000  AST 20  ALT 14  ALKPHOS 52  ALBUMIN 4.6   Recent Labs    02/17/22 1010 04/18/22 1345 08/08/22 0000  WBC 5.2 6.1 4.9  HGB 11.4* 12.9 11.5*  HCT 35.3* 41 35*  MCV 93.6  --   --   PLT 153 291 157   Lab Results  Component Value Date   TSH 1.39 01/02/2022   No results found for: "HGBA1C" Lab Results  Component Value Date   CHOL 238 (A) 08/20/2018   HDL 67 08/20/2018   LDLCALC 154 08/20/2018   LDLDIRECT 118.7 07/20/2011   TRIG 85 08/20/2018   CHOLHDL 4 12/30/2013    Significant Diagnostic Results in last 30 days:  No results found.  Assessment/Plan 1. Essential hypertension - controlled with losartan - cont hydralazine prn for SBP > 180  2. Dementia without behavioral disturbance (HCC) - no behaviors - increased confusion with narcotic pain medication - ambulates with rolator - weights stable - cont skilled nursing   3. Chronic midline low back pain without sciatica - ongoing - h/o spinal fusion - 11/19/2022 CT lumbar spine severe degenerative disc disease to T11-T12, T12-L1 and L1-L2 - cont tylenol and robaxin  4. Senile osteoporosis - DEXA  2020, t score -4.5 - last Prolia injection 08/2021  5. Chronic constipation - abdomen soft - cont senna and miralax prn  6. Bilateral hearing loss, unspecified hearing loss type - cerumen impaction 10/2022 - followed by ENT - bilateral hearing aids   7. Aortic atherosclerosis (HCC) - noted CT chest 10/2022  8. Senile purpura (HCC) - education given to patient and nursing    Family/ staff Communication: plan discussed with patient and nurse  Labs/tests ordered:  none

## 2023-01-10 DIAGNOSIS — H6121 Impacted cerumen, right ear: Secondary | ICD-10-CM | POA: Diagnosis not present

## 2023-01-17 DIAGNOSIS — D485 Neoplasm of uncertain behavior of skin: Secondary | ICD-10-CM | POA: Diagnosis not present

## 2023-01-17 DIAGNOSIS — L570 Actinic keratosis: Secondary | ICD-10-CM | POA: Diagnosis not present

## 2023-01-17 DIAGNOSIS — L299 Pruritus, unspecified: Secondary | ICD-10-CM | POA: Diagnosis not present

## 2023-01-19 DIAGNOSIS — H35033 Hypertensive retinopathy, bilateral: Secondary | ICD-10-CM | POA: Diagnosis not present

## 2023-01-19 DIAGNOSIS — H353113 Nonexudative age-related macular degeneration, right eye, advanced atrophic without subfoveal involvement: Secondary | ICD-10-CM | POA: Diagnosis not present

## 2023-01-19 DIAGNOSIS — H353221 Exudative age-related macular degeneration, left eye, with active choroidal neovascularization: Secondary | ICD-10-CM | POA: Diagnosis not present

## 2023-01-19 DIAGNOSIS — H43813 Vitreous degeneration, bilateral: Secondary | ICD-10-CM | POA: Diagnosis not present

## 2023-02-01 ENCOUNTER — Non-Acute Institutional Stay (SKILLED_NURSING_FACILITY): Payer: Medicare HMO | Admitting: Adult Health

## 2023-02-01 ENCOUNTER — Encounter: Payer: Self-pay | Admitting: Adult Health

## 2023-02-01 DIAGNOSIS — I10 Essential (primary) hypertension: Secondary | ICD-10-CM

## 2023-02-01 DIAGNOSIS — Z66 Do not resuscitate: Secondary | ICD-10-CM

## 2023-02-01 NOTE — Progress Notes (Signed)
Location:  [facility] Room Number: 135 A Place of Service:  SNF [phone]) Provider:  Fletcher Anon, NP    Patient Care Team: Mahlon Gammon, MD as PCP - General (Internal Medicine) Bernette Redbird, MD as Consulting Physician (Gastroenterology) Dannielle Huh, MD as Consulting Physician (Orthopedic Surgery) Ellen Henri as Consulting Physician (Dermatology) Cassell Clement, MD as Consulting Physician (Cardiology) Maris Berger, MD as Consulting Physician (Ophthalmology) Glennis Brink, MD as Referring Physician (Dermatology)  Extended Emergency Contact Information Primary Emergency Contact: Berton Mount of [place] Home Phone: [phone] Relation: Daughter Secondary Emergency Contact: [facility] Address: [phone] [address] [place] [address] Darden Amber of [place] Home Phone: [phone] Mobile Phone: [phone] Relation: Son  Code Status:  DNR Goals of care: Advanced Directive information    02/01/2023   10:19 AM  Advanced Directives  Does Patient Have a Medical Advance Directive? Yes  Type of Estate agent of [place];Out of facility DNR (pink MOST or yellow form);Living will  Does patient want to make changes to medical advance directive? No - Patient declined  Copy of Healthcare Power of Attorney in Chart? Yes - validated most recent copy scanned in chart (See row information)  Pre-existing out of facility DNR order (yellow form or pink MOST form) Yellow form placed in chart (order not valid for inpatient use);Pink MOST form placed in chart (order not valid for inpatient use)     Chief Complaint  Patient presents with   Acute Visit    Hypertension     HPI:  Pt is a 89 y.o. female seen today for an acute visit for HTN  The nurse at [facility] wrote an SBAR to eval her bp.  BP ranges 101-169/64-92.  No edema, sob, etc.  Hydralazine is ordered prn but not given as her SBP was  not greater than 180. Takes losartan 75 mg daily BUN/Cr 21/0.6 07/2022  The nurse also reports that her HR can be over 100 but she is asymptomatic .  She is 89 and has a hx of falls and memory loss and now lives in skilled care.     Past Medical History:  Diagnosis Date   Arthritis    "all over"   BACK PAIN 03/08/2007   Cancer (HCC)    squamous cell on R leg & face- ?basal cell   Cutaneous abscess of right lower limb    DEPRESSIVE DISORDER 12/01/2008   Disruption of external operation (surgical) wound, not elsewhere classified, initial encounter    Duodenal ulcer    Essential (primary) hypertension    GI bleed 09/12/2010   Naproxen induced Endoscopy with dr Marjorie Smolder    HIATAL HERNIA 12/17/2006   History of hiatal hernia    HYPERLIPIDEMIA 03/09/2006   Hyperlipidemia, unspecified    HYPERTENSION 03/09/2006   KNEE PAIN 05/06/2009   Macular degeneration    Major depressive disorder, single episode, unspecified    Pneumonia    46yrs. ago- hosp. pneumonia   Urinary urgency    Past Surgical History:  Procedure Laterality Date   ABDOMINAL HYSTERECTOMY  1975   APPLICATION OF A-CELL OF EXTREMITY Right 12/10/2014   Procedure: APPLICATION OF A-CELL OF EXTREMITY;  Surgeon: Wayland Denis, DO;  Location: [facility] OR;  Service: Plastics;  Laterality: Right;   arthroscopic knee Right    x 2   BACK SURGERY  2000   spinal fusion   BILATERAL SALPINGECTOMY Bilateral 1989   Dr. Gavin Potters  CARDIAC CATHETERIZATION     EYE SURGERY     cataracts bilateral - removed   HERNIA REPAIR Bilateral    inguinal    I & D EXTREMITY Right 12/10/2014   Procedure: IRRIGATION AND DEBRIDEMENT ON RIGHT LEG, ;  Surgeon: Wayland Denis, DO;  Location: [facility] OR;  Service: Plastics;  Laterality: Right;   I & D EXTREMITY Right 01/20/2015   Procedure: IRRIGATION AND DEBRIDEMENT RIGHT LOWER LEG WOUND, with ACELL to Donor Site, SKIN GRAFT AND VAC Placement;  Surgeon: Wayland Denis, DO;  Location: [facility] OR;  Service: Plastics;   Laterality: Right;   LUMBAR LAMINECTOMY  02-17-1999   with sinal fusion L3,4,5,&S1   moes     moses surgery on R lle   OOPHORECTOMY  1989   RESECTION DISTAL CLAVICAL     ROTATOR CUFF REPAIR Right 2001   x2   SKIN GRAFT Right 05/29/2018   THYROIDECTOMY     TONSILLECTOMY  1928   TUBAL LIGATION      Allergies  Allergen Reactions   Naproxen     Gi bleed   Nsaids     GI BLEED   Aspirin     GI bleed   Ace Inhibitors     Cough    Caffeine Other (See Comments)    Causes joints to be painful    Outpatient Encounter Medications as of 02/01/2023  Medication Sig   acetaminophen (TYLENOL) 500 MG tablet Take 1,000 mg by mouth 3 (three) times daily.   Carboxymethylcellulose Sodium (THERATEARS) 0.25 % SOLN Apply 2-3 drops to eye in the morning, at noon, in the evening, and at bedtime.   DULoxetine (CYMBALTA) 20 MG capsule Take 1 capsule (20 mg total) by mouth daily.   hydrALAZINE (APRESOLINE) 10 MG tablet Take 10 mg by mouth 2 (two) times daily as needed.  for SBP>180   losartan (COZAAR) 50 MG tablet Take 1.5 tablets (75 mg total) by mouth daily.   methocarbamol (ROBAXIN) 500 MG tablet Take 500 mg by mouth every 6 (six) hours as needed for muscle spasms.   polyethylene glycol powder (GLYCOLAX/MIRALAX) 17 GM/SCOOP powder Take 17 g by mouth daily as needed.   senna (SENOKOT) 8.6 MG TABS tablet Take 2 tablets by mouth at bedtime as needed for mild constipation.   [DISCONTINUED] sennosides-docusate sodium (SENOKOT-S) 8.6-50 MG tablet Take 2 tablets by mouth daily.   No facility-administered encounter medications on file as of 02/01/2023.    Review of Systems  Constitutional:  Negative for activity change, appetite change, chills, diaphoresis, fatigue, fever and unexpected weight change.  HENT:  Negative for congestion.   Respiratory:  Negative for cough, shortness of breath and wheezing.   Cardiovascular:  Negative for chest pain, palpitations and leg swelling.  Gastrointestinal:  Negative  for abdominal distention, abdominal pain, constipation and diarrhea.  Genitourinary:  Negative for difficulty urinating and dysuria.  Musculoskeletal:  Positive for gait problem. Negative for arthralgias, back pain, joint swelling and myalgias.  Neurological:  Negative for dizziness, tremors, seizures, syncope, facial asymmetry, speech difficulty, weakness, light-headedness, numbness and headaches.  Psychiatric/Behavioral:  Positive for confusion. Negative for agitation and behavioral problems.     Immunization History  Administered Date(s) Administered   Covid-19, Mrna,Vaccine(Spikevax)42yrs and older 09/21/2022   Fluad Quad(high Dose 65+) 04/05/2020   Influenza Split 03/08/2011, 03/05/2012   Influenza Whole 03/08/2007, 03/02/2009, 03/29/2010   Influenza, High Dose Seasonal PF 03/21/2013, 06/13/2017, 03/07/2019, 03/26/2020   Influenza,inj,Quad PF,6+ Mos 02/20/2014, 02/11/2015, 03/22/2018   Influenza-Unspecified 03/23/2016, 04/05/2020,  03/03/2022   Moderna Covid-19 Vaccine Bivalent Booster 86yrs & up 03/10/2021   Moderna SARS-COV2 Booster Vaccination 09/20/2020   Moderna Sars-Covid-2 Vaccination 06/10/2019, 07/09/2019, 10/13/2021, 04/03/2022   PFIZER(Purple Top)SARS-COV-2 Vaccination 04/05/2020   Pneumococcal Conjugate-13 07/30/2014   Pneumococcal Polysaccharide-23 02/03/2002, 12/16/2009   Td 02/03/2002   Tdap 12/11/2012, 12/23/2022   Zoster Recombinant(Shingrix) 05/10/2022, 07/11/2022   Pertinent  Health Maintenance Due  Topic Date Due   INFLUENZA VACCINE  12/28/2022   DEXA SCAN  Completed      05/19/2022   10:07 AM 08/08/2022    1:26 PM 11/20/2022    1:03 PM 12/22/2022    9:56 AM 01/09/2023    2:29 PM  Fall Risk  Falls in the past year? 1 1 1 1 1   Was there an injury with Fall? 0 1 1 1  0  Fall Risk Category Calculator 2 2 3 3 1   Fall Risk Category (Retired) Moderate      (RETIRED) Patient Fall Risk Level High fall risk      Patient at Risk for Falls Due to Impaired  balance/gait;Impaired mobility History of fall(s) History of fall(s);Impaired balance/gait;Impaired mobility;Impaired vision History of fall(s);Impaired balance/gait History of fall(s);Impaired balance/gait  Fall risk Follow up Falls evaluation completed Falls evaluation completed Falls evaluation completed Falls evaluation completed Falls evaluation completed;Education provided   Functional Status Survey:    Vitals:   02/01/23 1015  BP: (!) 146/82  Pulse: 97  Temp: (!) 97 F (36.1 C)  SpO2: 92%  Weight: 124 lb (56.2 kg)  Height: 4\' 11"  (1.499 m)   Body mass index is 25.04 kg/m. Physical Exam Vitals and nursing note reviewed.  Constitutional:      Appearance: Normal appearance.  HENT:     Mouth/Throat:     Mouth: Mucous membranes are moist.     Pharynx: Oropharynx is clear.  Cardiovascular:     Rate and Rhythm: Normal rate and regular rhythm.  Pulmonary:     Effort: Pulmonary effort is normal. No respiratory distress.     Breath sounds: Normal breath sounds. No wheezing.  Abdominal:     General: Abdomen is flat. Bowel sounds are normal. There is no distension.     Palpations: Abdomen is soft.  Musculoskeletal:     Right lower leg: No edema.     Left lower leg: No edema.  Neurological:     General: No focal deficit present.     Mental Status: She is alert. Mental status is at baseline.     Labs reviewed: Recent Labs    02/17/22 1010 03/03/22 0000 04/18/22 1345 08/08/22 0000  NA 141 141 143 138  K 3.6 4.1 4.4 4.0  CL 109 102 103 101  CO2 26 27* 25* 26*  GLUCOSE 95  --   --   --   BUN 19 18 14 21   CREATININE 0.49 0.7 0.6 0.6  CALCIUM 8.6* 9.4 10.2 9.4   Recent Labs    03/03/22 0000  AST 20  ALT 14  ALKPHOS 52  ALBUMIN 4.6   Recent Labs    02/17/22 1010 04/18/22 1345 08/08/22 0000  WBC 5.2 6.1 4.9  HGB 11.4* 12.9 11.5*  HCT 35.3* 41 35*  MCV 93.6  --   --   PLT 153 291 157   Lab Results  Component Value Date   TSH 1.39 01/02/2022   No  results found for: "HGBA1C" Lab Results  Component Value Date   CHOL 238 (A) 08/20/2018   HDL 67  08/20/2018   LDLCALC 154 08/20/2018   LDLDIRECT 118.7 07/20/2011   TRIG 85 08/20/2018   CHOLHDL 4 12/30/2013    Significant Diagnostic Results in last 30 days:  No results found.  Assessment/Plan  1. Do not resuscitate  - Do not attempt resuscitation (DNR)  2. Essential hypertension Manual bp today was 103/62 HR regular at 97 Given her hx of falls and age of 69 would avoid being aggressive with bp management.  Keep losartan and prn hydralazine.     Family/ staff Communication: nurse  Labs/tests ordered:  BMP On 02/04/23

## 2023-02-02 DIAGNOSIS — I10 Essential (primary) hypertension: Secondary | ICD-10-CM | POA: Diagnosis not present

## 2023-02-02 LAB — BASIC METABOLIC PANEL
BUN: 25 — AB (ref 4–21)
CO2: 22 (ref 13–22)
Chloride: 104 (ref 99–108)
Creatinine: 0.7 (ref 0.5–1.1)
Glucose: 85
Potassium: 4.7 meq/L (ref 3.5–5.1)
Sodium: 140 (ref 137–147)

## 2023-02-02 LAB — COMPREHENSIVE METABOLIC PANEL: Calcium: 8.7 (ref 8.7–10.7)

## 2023-02-15 ENCOUNTER — Telehealth: Payer: Self-pay | Admitting: *Deleted

## 2023-02-15 NOTE — Telephone Encounter (Signed)
[employer] #: [phone]

## 2023-02-15 NOTE — Telephone Encounter (Signed)
Prior Authorization for Prolia was initiated through [employer].  Spoke with Cornell Barman and Prolia was APPROVED 03/28/2023-03/27/2024 Auth #: [id number]   **Patient's appointment is scheduled for 03/23/2023. I called Caregiver and Left detailed message to call office to Reschedule Patient's Prolia Injection till AFTER 03/28/2023 due to Authorization Dates.

## 2023-02-20 NOTE — Telephone Encounter (Signed)
Patient rescheduled to 03/30/2023

## 2023-02-21 ENCOUNTER — Non-Acute Institutional Stay (SKILLED_NURSING_FACILITY): Payer: Medicare HMO | Admitting: Orthopedic Surgery

## 2023-02-21 ENCOUNTER — Encounter: Payer: Self-pay | Admitting: Orthopedic Surgery

## 2023-02-21 DIAGNOSIS — F424 Excoriation (skin-picking) disorder: Secondary | ICD-10-CM

## 2023-02-21 DIAGNOSIS — S81801A Unspecified open wound, right lower leg, initial encounter: Secondary | ICD-10-CM | POA: Diagnosis not present

## 2023-02-21 DIAGNOSIS — G3184 Mild cognitive impairment, so stated: Secondary | ICD-10-CM | POA: Diagnosis not present

## 2023-02-21 MED ORDER — MUPIROCIN 2 % EX OINT
1.0000 | TOPICAL_OINTMENT | Freq: Every day | CUTANEOUS | Status: AC
Start: 2023-02-21 — End: 2023-02-28

## 2023-02-21 NOTE — Progress Notes (Signed)
Location:  [facility] Room Number: 135/A Place of Service:  SNF [phone]) Provider:  Octavia Heir, NP   Mahlon Gammon, MD  Patient Care Team: Mahlon Gammon, MD as PCP - General (Internal Medicine) Bernette Redbird, MD as Consulting Physician (Gastroenterology) Dannielle Huh, MD as Consulting Physician (Orthopedic Surgery) Ellen Henri as Consulting Physician (Dermatology) Cassell Clement, MD as Consulting Physician (Cardiology) Maris Berger, MD as Consulting Physician (Ophthalmology) Glennis Brink, MD as Referring Physician (Dermatology)  Extended Emergency Contact Information Primary Emergency Contact: Berton Mount of [place] Home Phone: [phone] Relation: Daughter Secondary Emergency Contact: [facility] Address: [phone], [place] [address] Darden Amber of [place] Home Phone: [phone] Mobile Phone: [phone] Relation: Son  Code Status:  DNR Goals of care: Advanced Directive information    02/01/2023   10:19 AM  Advanced Directives  Does Patient Have a Medical Advance Directive? Yes  Type of Estate agent of [place];Out of facility DNR (pink MOST or yellow form);Living will  Does patient want to make changes to medical advance directive? No - Patient declined  Copy of Healthcare Power of Attorney in Chart? Yes - validated most recent copy scanned in chart (See row information)  Pre-existing out of facility DNR order (yellow form or pink MOST form) Yellow form placed in chart (order not valid for inpatient use);Pink MOST form placed in chart (order not valid for inpatient use)     Chief Complaint  Patient presents with   Acute Visit    Skin infection    HPI:  Pt is a 89 y.o. female seen today for acute visit due to skin infection.   She currently resides on the skilled nursing unit at [facility]. PMH: HTN, HLD, LBBB, hiatal hernia, constipation, cognitive  impairment, OA, osteoporosis, and depression.   Family present during encounter.   Nursing reports skin infection to right lower extremity. Purulent drainage and odor noted during dressing change last night. Today, she reports mild tenderness around wound. Afebrile. Vital stable.   Family also notes skin picking to left cheek. Son purchased her hydrocolloid dressings. She reported discomfort when I removed dressing today. Appears family has tried multiple types of bandages without success. Unclear if behavior is due to anxiety/dementia or wound healing. She is on Cymbalta for depression.   Past Medical History:  Diagnosis Date   Arthritis    "all over"   BACK PAIN 03/08/2007   Cancer (HCC)    squamous cell on R leg & face- ?basal cell   Cutaneous abscess of right lower limb    DEPRESSIVE DISORDER 12/01/2008   Disruption of external operation (surgical) wound, not elsewhere classified, initial encounter    Duodenal ulcer    Essential (primary) hypertension    GI bleed 09/12/2010   Naproxen induced Endoscopy with dr Marjorie Smolder    HIATAL HERNIA 12/17/2006   History of hiatal hernia    HYPERLIPIDEMIA 03/09/2006   Hyperlipidemia, unspecified    HYPERTENSION 03/09/2006   KNEE PAIN 05/06/2009   Macular degeneration    Major depressive disorder, single episode, unspecified    Pneumonia    61yrs. ago- hosp. pneumonia   Urinary urgency    Past Surgical History:  Procedure Laterality Date   ABDOMINAL HYSTERECTOMY  1975   APPLICATION OF A-CELL OF EXTREMITY Right 12/10/2014   Procedure: APPLICATION OF A-CELL OF EXTREMITY;  Surgeon: Wayland Denis, DO;  Location: [facility] OR;  Service: Plastics;  Laterality: Right;   arthroscopic knee Right    x 2   BACK SURGERY  2000   spinal fusion   BILATERAL SALPINGECTOMY Bilateral 1989   Dr. Gavin Potters   CARDIAC CATHETERIZATION     EYE SURGERY     cataracts bilateral - removed   HERNIA REPAIR Bilateral    inguinal    I & D EXTREMITY Right 12/10/2014    Procedure: IRRIGATION AND DEBRIDEMENT ON RIGHT LEG, ;  Surgeon: Wayland Denis, DO;  Location: [facility] OR;  Service: Plastics;  Laterality: Right;   I & D EXTREMITY Right 01/20/2015   Procedure: IRRIGATION AND DEBRIDEMENT RIGHT LOWER LEG WOUND, with ACELL to Donor Site, SKIN GRAFT AND VAC Placement;  Surgeon: Wayland Denis, DO;  Location: [facility] OR;  Service: Plastics;  Laterality: Right;   LUMBAR LAMINECTOMY  02-17-1999   with sinal fusion L3,4,5,&S1   moes     moses surgery on R lle   OOPHORECTOMY  1989   RESECTION DISTAL CLAVICAL     ROTATOR CUFF REPAIR Right 2001   x2   SKIN GRAFT Right 05/29/2018   THYROIDECTOMY     TONSILLECTOMY  1928   TUBAL LIGATION      Allergies  Allergen Reactions   Naproxen     Gi bleed   Nsaids     GI BLEED   Aspirin     GI bleed   Ace Inhibitors     Cough    Caffeine Other (See Comments)    Causes joints to be painful    Outpatient Encounter Medications as of 02/21/2023  Medication Sig   acetaminophen (TYLENOL) 500 MG tablet Take 1,000 mg by mouth 3 (three) times daily.   Carboxymethylcellulose Sodium (THERATEARS) 0.25 % SOLN Apply 2-3 drops to eye in the morning, at noon, in the evening, and at bedtime.   DULoxetine (CYMBALTA) 20 MG capsule Take 1 capsule (20 mg total) by mouth daily.   hydrALAZINE (APRESOLINE) 10 MG tablet Take 10 mg by mouth 2 (two) times daily as needed.  for SBP>180   losartan (COZAAR) 50 MG tablet Take 1.5 tablets (75 mg total) by mouth daily.   methocarbamol (ROBAXIN) 500 MG tablet Take 500 mg by mouth every 6 (six) hours as needed for muscle spasms.   polyethylene glycol powder (GLYCOLAX/MIRALAX) 17 GM/SCOOP powder Take 17 g by mouth daily as needed.   senna (SENOKOT) 8.6 MG TABS tablet Take 2 tablets by mouth at bedtime as needed for mild constipation.   No facility-administered encounter medications on file as of 02/21/2023.    Review of Systems  Unable to perform ROS: Dementia    Immunization History  Administered Date(s)  Administered   Fluad Quad(high Dose 65+) 04/05/2020   Influenza Split 03/08/2011, 03/05/2012   Influenza Whole 03/08/2007, 03/02/2009, 03/29/2010   Influenza, High Dose Seasonal PF 03/21/2013, 06/13/2017, 03/07/2019, 03/26/2020   Influenza,inj,Quad PF,6+ Mos 02/20/2014, 02/11/2015, 03/22/2018   Influenza-Unspecified 03/23/2016, 04/05/2020, 03/03/2022   Moderna Covid-19 Fall Seasonal Vaccine 70yrs & older 09/21/2022   Moderna Covid-19 Vaccine Bivalent Booster 61yrs & up 03/10/2021   Moderna SARS-COV2 Booster Vaccination 09/20/2020   Moderna Sars-Covid-2 Vaccination 06/10/2019, 07/09/2019, 10/13/2021, 04/03/2022   PFIZER(Purple Top)SARS-COV-2 Vaccination 04/05/2020   Pneumococcal Conjugate-13 07/30/2014   Pneumococcal Polysaccharide-23 02/03/2002, 12/16/2009   Td 02/03/2002   Tdap 12/11/2012, 12/23/2022   Zoster Recombinant(Shingrix) 05/10/2022, 07/11/2022   Pertinent  Health Maintenance Due  Topic Date Due   INFLUENZA VACCINE  12/28/2022   DEXA SCAN  Completed  05/19/2022   10:07 AM 08/08/2022    1:26 PM 11/20/2022    1:03 PM 12/22/2022    9:56 AM 01/09/2023    2:29 PM  Fall Risk  Falls in the past year? 1 1 1 1 1   Was there an injury with Fall? 0 1 1 1  0  Fall Risk Category Calculator 2 2 3 3 1   Fall Risk Category (Retired) Moderate      (RETIRED) Patient Fall Risk Level High fall risk      Patient at Risk for Falls Due to Impaired balance/gait;Impaired mobility History of fall(s) History of fall(s);Impaired balance/gait;Impaired mobility;Impaired vision History of fall(s);Impaired balance/gait History of fall(s);Impaired balance/gait  Fall risk Follow up Falls evaluation completed Falls evaluation completed Falls evaluation completed Falls evaluation completed Falls evaluation completed;Education provided   Functional Status Survey:    Vitals:   02/21/23 1228  BP: (!) 155/76  Pulse: 70  Resp: 20  Temp: 97.9 F (36.6 C)  SpO2: 97%  Weight: 124 lb 11.2 oz (56.6 kg)   Height: 4\' 11"  (1.499 m)   Body mass index is 25.19 kg/m. Physical Exam Vitals reviewed.  Constitutional:      General: She is not in acute distress. HENT:     Head: Normocephalic.  Eyes:     General:        Right eye: No discharge.        Left eye: No discharge.  Cardiovascular:     Rate and Rhythm: Normal rate and regular rhythm.     Pulses: Normal pulses.     Heart sounds: Normal heart sounds.  Abdominal:     Palpations: Abdomen is soft.  Musculoskeletal:     Cervical back: Neck supple.     Right lower leg: No edema.     Left lower leg: No edema.  Skin:    General: Skin is warm.     Capillary Refill: Capillary refill takes less than 2 seconds.     Findings: Lesion present.     Comments: Approx 1 cm round open wound, granulation tissue 100%, no odor or purulent drainage, mild tenderness around wound. Approx 0.5 cm open wound to left chin, skin appears macerated, no sign of infection.   Neurological:     General: No focal deficit present.     Mental Status: She is alert. Mental status is at baseline.     Motor: Weakness present.     Gait: Gait abnormal.  Psychiatric:        Mood and Affect: Mood normal.     Labs reviewed: Recent Labs    03/03/22 0000 04/18/22 1345 08/08/22 0000  NA 141 143 138  K 4.1 4.4 4.0  CL 102 103 101  CO2 27* 25* 26*  BUN 18 14 21   CREATININE 0.7 0.6 0.6  CALCIUM 9.4 10.2 9.4   Recent Labs    03/03/22 0000  AST 20  ALT 14  ALKPHOS 52  ALBUMIN 4.6   Recent Labs    04/18/22 1345 08/08/22 0000  WBC 6.1 4.9  HGB 12.9 11.5*  HCT 41 35*  PLT 291 157   Lab Results  Component Value Date   TSH 1.39 01/02/2022   No results found for: "HGBA1C" Lab Results  Component Value Date   CHOL 238 (A) 08/20/2018   HDL 67 08/20/2018   LDLCALC 154 08/20/2018   LDLDIRECT 118.7 07/20/2011   TRIG 85 08/20/2018   CHOLHDL 4 12/30/2013    Significant Diagnostic Results in last  30 days:  No results found.  Assessment/Plan 1. Open  wound of right lower leg with complication, initial encounter - noted 09/24 - 100% granulation tissue, no purulent drainage/odor/warmth - mild tenderness noted around wound - sensitive to antibiotics> diarrhea - will start mupirocin 2% ointment- apply to wound daily x 7 days - cont dressing changes with polymem  2. Skin picking habit - ongoing - son had been applying hydrocollid dressing> very strong adhesive  - skin appears macerated - will try 2 x 2 polymem every 3 days - ? If behaviors associated with anxiety/dementia or wound healing  3. Mild cognitive impairment with memory loss - no behaviors, very pleasant, supportive family - doing well in SNF - weights stable - cont skilled nursing     Family/ staff Communication: plan discussed with patient, family and nurse  Labs/tests ordered:  none

## 2023-03-19 ENCOUNTER — Encounter: Payer: Self-pay | Admitting: Internal Medicine

## 2023-03-19 NOTE — Progress Notes (Unsigned)
Location:  [facility] Room Number: 135A Place of Service:  SNF [phone]) Provider:  Mahlon Gammon, MD   Mahlon Gammon, MD  Patient Care Team: Mahlon Gammon, MD as PCP - General (Internal Medicine) Bernette Redbird, MD as Consulting Physician (Gastroenterology) Dannielle Huh, MD as Consulting Physician (Orthopedic Surgery) Ellen Henri as Consulting Physician (Dermatology) Cassell Clement, MD as Consulting Physician (Cardiology) Maris Berger, MD as Consulting Physician (Ophthalmology) Glennis Brink, MD as Referring Physician (Dermatology)  Extended Emergency Contact Information Primary Emergency Contact: Berton Mount of [place] Home Phone: [phone] Relation: Daughter Secondary Emergency Contact: [facility] Address: [phone] [address], [place] [address] Darden Amber of [place] Home Phone: [phone] Mobile Phone: [phone] Relation: Son  Code Status:  DNR Goals of care: Advanced Directive information    03/19/2023   11:00 AM  Advanced Directives  Does Patient Have a Medical Advance Directive? Yes  Type of Estate agent of [place];Out of facility DNR (pink MOST or yellow form);Living will  Does patient want to make changes to medical advance directive? No - Patient declined  Copy of Healthcare Power of Attorney in Chart? Yes - validated most recent copy scanned in chart (See row information)  Pre-existing out of facility DNR order (yellow form or pink MOST form) Yellow form placed in chart (order not valid for inpatient use);Pink MOST form placed in chart (order not valid for inpatient use)     Chief Complaint  Patient presents with   Medical Management of Chronic Issues    Patient is being seen for a routine visit    Immunizations    Patiens is due for flu and covid vaccine     HPI:  Pt is a 89 y.o. female seen today for medical management of chronic diseases.      Past Medical History:  Diagnosis Date   Arthritis    "all over"   BACK PAIN 03/08/2007   Cancer (HCC)    squamous cell on R leg & face- ?basal cell   Cutaneous abscess of right lower limb    DEPRESSIVE DISORDER 12/01/2008   Disruption of external operation (surgical) wound, not elsewhere classified, initial encounter    Duodenal ulcer    Essential (primary) hypertension    GI bleed 09/12/2010   Naproxen induced Endoscopy with dr Marjorie Smolder    HIATAL HERNIA 12/17/2006   History of hiatal hernia    HYPERLIPIDEMIA 03/09/2006   Hyperlipidemia, unspecified    HYPERTENSION 03/09/2006   KNEE PAIN 05/06/2009   Macular degeneration    Major depressive disorder, single episode, unspecified    Pneumonia    72yrs. ago- hosp. pneumonia   Urinary urgency    Past Surgical History:  Procedure Laterality Date   ABDOMINAL HYSTERECTOMY  1975   APPLICATION OF A-CELL OF EXTREMITY Right 12/10/2014   Procedure: APPLICATION OF A-CELL OF EXTREMITY;  Surgeon: Wayland Denis, DO;  Location: [facility] OR;  Service: Plastics;  Laterality: Right;   arthroscopic knee Right    x 2   BACK SURGERY  2000   spinal fusion   BILATERAL SALPINGECTOMY Bilateral 1989   Dr. Gavin Potters   CARDIAC CATHETERIZATION     EYE SURGERY     cataracts bilateral - removed   HERNIA REPAIR Bilateral    inguinal    I & D EXTREMITY Right 12/10/2014   Procedure: IRRIGATION AND DEBRIDEMENT ON RIGHT LEG, ;  Surgeon: Wayland Denis, DO;  Location: [facility] OR;  Service: Plastics;  Laterality: Right;   I & D EXTREMITY Right 01/20/2015   Procedure: IRRIGATION AND DEBRIDEMENT RIGHT LOWER LEG WOUND, with ACELL to Donor Site, SKIN GRAFT AND VAC Placement;  Surgeon: Wayland Denis, DO;  Location: [facility] OR;  Service: Plastics;  Laterality: Right;   LUMBAR LAMINECTOMY  02-17-1999   with sinal fusion L3,4,5,&S1   moes     moses surgery on R lle   OOPHORECTOMY  1989   RESECTION DISTAL CLAVICAL     ROTATOR CUFF REPAIR Right 2001   x2   SKIN GRAFT Right  05/29/2018   THYROIDECTOMY     TONSILLECTOMY  1928   TUBAL LIGATION      Allergies  Allergen Reactions   Naproxen     Gi bleed   Nsaids     GI BLEED   Aspirin     GI bleed   Ace Inhibitors     Cough    Caffeine Other (See Comments)    Causes joints to be painful    Outpatient Encounter Medications as of 03/19/2023  Medication Sig   acetaminophen (TYLENOL) 500 MG tablet Take 1,000 mg by mouth 3 (three) times daily.   BOOSTRIX 5-2.5-18.5 LF-MCG/0.5 injection Inject 0.5 mLs into the muscle once.   Carboxymethylcellulose Sodium (THERATEARS) 0.25 % SOLN Apply 2-3 drops to eye in the morning, at noon, in the evening, and at bedtime.   DULoxetine (CYMBALTA) 20 MG capsule Take 1 capsule (20 mg total) by mouth daily.   hydrALAZINE (APRESOLINE) 10 MG tablet Take 10 mg by mouth 2 (two) times daily as needed.  for SBP>180   losartan (COZAAR) 50 MG tablet Take 1.5 tablets (75 mg total) by mouth daily.   methocarbamol (ROBAXIN) 500 MG tablet Take 500 mg by mouth every 6 (six) hours as needed for muscle spasms.   polyethylene glycol powder (GLYCOLAX/MIRALAX) 17 GM/SCOOP powder Take 17 g by mouth daily as needed.   senna (SENOKOT) 8.6 MG TABS tablet Take 2 tablets by mouth at bedtime as needed for mild constipation.   No facility-administered encounter medications on file as of 03/19/2023.    Review of Systems  Immunization History  Administered Date(s) Administered   Fluad Quad(high Dose 65+) 04/05/2020   Influenza Split 03/08/2011, 03/05/2012   Influenza Whole 03/08/2007, 03/02/2009, 03/29/2010   Influenza, High Dose Seasonal PF 03/21/2013, 06/13/2017, 03/07/2019, 03/26/2020   Influenza,inj,Quad PF,6+ Mos 02/20/2014, 02/11/2015, 03/22/2018   Influenza-Unspecified 03/23/2016, 04/05/2020, 03/03/2022   Moderna Covid-19 Fall Seasonal Vaccine 48yrs & older 09/21/2022   Moderna Covid-19 Vaccine Bivalent Booster 39yrs & up 03/10/2021   Moderna SARS-COV2 Booster Vaccination 09/20/2020    Moderna Sars-Covid-2 Vaccination 06/10/2019, 07/09/2019, 10/13/2021, 04/03/2022   PFIZER(Purple Top)SARS-COV-2 Vaccination 04/05/2020   Pneumococcal Conjugate-13 07/30/2014   Pneumococcal Polysaccharide-23 02/03/2002, 12/16/2009   Td 02/03/2002   Tdap 12/11/2012, 12/23/2022   Zoster Recombinant(Shingrix) 05/10/2022, 07/11/2022   Pertinent  Health Maintenance Due  Topic Date Due   INFLUENZA VACCINE  12/28/2022   DEXA SCAN  Completed      05/19/2022   10:07 AM 08/08/2022    1:26 PM 11/20/2022    1:03 PM 12/22/2022    9:56 AM 01/09/2023    2:29 PM  Fall Risk  Falls in the past year? 1 1 1 1 1   Was there an injury with Fall? 0 1 1 1  0  Fall Risk Category Calculator 2 2 3 3 1   Fall Risk Category (Retired) Moderate      (RETIRED)  Patient Fall Risk Level High fall risk      Patient at Risk for Falls Due to Impaired balance/gait;Impaired mobility History of fall(s) History of fall(s);Impaired balance/gait;Impaired mobility;Impaired vision History of fall(s);Impaired balance/gait History of fall(s);Impaired balance/gait  Fall risk Follow up Falls evaluation completed Falls evaluation completed Falls evaluation completed Falls evaluation completed Falls evaluation completed;Education provided   Functional Status Survey:    Vitals:   03/19/23 1057  BP: 132/83  Pulse: 96  Resp: 17  Temp: 97.9 F (36.6 C)  TempSrc: Temporal  SpO2: 93%  Weight: 124 lb (56.2 kg)  Height: 4\' 11"  (1.499 m)   Body mass index is 25.04 kg/m. Physical Exam  Labs reviewed: Recent Labs    04/18/22 1345 08/08/22 0000  NA 143 138  K 4.4 4.0  CL 103 101  CO2 25* 26*  BUN 14 21  CREATININE 0.6 0.6  CALCIUM 10.2 9.4   No results for input(s): "AST", "ALT", "ALKPHOS", "BILITOT", "PROT", "ALBUMIN" in the last 8760 hours. Recent Labs    04/18/22 1345 08/08/22 0000  WBC 6.1 4.9  HGB 12.9 11.5*  HCT 41 35*  PLT 291 157   Lab Results  Component Value Date   TSH 1.39 01/02/2022   No results found  for: "HGBA1C" Lab Results  Component Value Date   CHOL 238 (A) 08/20/2018   HDL 67 08/20/2018   LDLCALC 154 08/20/2018   LDLDIRECT 118.7 07/20/2011   TRIG 85 08/20/2018   CHOLHDL 4 12/30/2013    Significant Diagnostic Results in last 30 days:  No results found.  Assessment/Plan There are no diagnoses linked to this encounter.   Family/ staff Communication: ***  Labs/tests ordered:  ***

## 2023-03-23 ENCOUNTER — Ambulatory Visit: Payer: Medicare HMO

## 2023-03-26 ENCOUNTER — Encounter: Payer: Self-pay | Admitting: Adult Health

## 2023-03-26 ENCOUNTER — Non-Acute Institutional Stay (SKILLED_NURSING_FACILITY): Payer: Medicare HMO | Admitting: Adult Health

## 2023-03-26 DIAGNOSIS — R21 Rash and other nonspecific skin eruption: Secondary | ICD-10-CM

## 2023-03-26 DIAGNOSIS — S81811D Laceration without foreign body, right lower leg, subsequent encounter: Secondary | ICD-10-CM | POA: Diagnosis not present

## 2023-03-26 NOTE — Progress Notes (Unsigned)
Location:  [facility] Room Number: 135A Place of Service:  SNF [phone]) Provider:  Tamsen Roers, MD  Patient Care Team: Mahlon Gammon, MD as PCP - General (Internal Medicine) Bernette Redbird, MD as Consulting Physician (Gastroenterology) Dannielle Huh, MD as Consulting Physician (Orthopedic Surgery) Ellen Henri as Consulting Physician (Dermatology) Cassell Clement, MD as Consulting Physician (Cardiology) Maris Berger, MD as Consulting Physician (Ophthalmology) Glennis Brink, MD as Referring Physician (Dermatology)  Extended Emergency Contact Information Primary Emergency Contact: Berton Mount of [place] Home Phone: [phone] Relation: Daughter Secondary Emergency Contact: [facility] Address: [phone] [address] [place] [address] Darden Amber of [place] Home Phone: [phone] Mobile Phone: [phone] Relation: Son  Code Status:  DNR Goals of care: Advanced Directive information    03/26/2023   11:35 AM  Advanced Directives  Does Patient Have a Medical Advance Directive? Yes  Type of Estate agent of [place];Out of facility DNR (pink MOST or yellow form);Living will  Does patient want to make changes to medical advance directive? No - Patient declined  Copy of Healthcare Power of Attorney in Chart? Yes - validated most recent copy scanned in chart (See row information)  Pre-existing out of facility DNR order (yellow form or pink MOST form) Yellow form placed in chart (order not valid for inpatient use);Pink MOST form placed in chart (order not valid for inpatient use)     Chief Complaint  Patient presents with   Acute Visit    Patient is being seen for a acute wound check    HPI:  Pt is a 89 y.o. female seen today for an acute visit for a wound check  The nurse wrote an SBAR indicating some redness and drainage to the skin tear on the RLE.  THe  patient denies any pain to this area. No fever noted. Staff using polymem to the area.   She has two areas of redness to the right cheek and left neck area that she picks at. Her son wants [place] to put an occlusive dressing on the area so it doesn't become infected. The patient denies itching. Due to her memory loss she forgets not to touch the area.      Past Medical History:  Diagnosis Date   Arthritis    "all over"   BACK PAIN 03/08/2007   Cancer (HCC)    squamous cell on R leg & face- ?basal cell   Cutaneous abscess of right lower limb    DEPRESSIVE DISORDER 12/01/2008   Disruption of external operation (surgical) wound, not elsewhere classified, initial encounter    Duodenal ulcer    Essential (primary) hypertension    GI bleed 09/12/2010   Naproxen induced Endoscopy with dr Marjorie Smolder    HIATAL HERNIA 12/17/2006   History of hiatal hernia    HYPERLIPIDEMIA 03/09/2006   Hyperlipidemia, unspecified    HYPERTENSION 03/09/2006   KNEE PAIN 05/06/2009   Macular degeneration    Major depressive disorder, single episode, unspecified    Pneumonia    97yrs. ago- hosp. pneumonia   Urinary urgency    Past Surgical History:  Procedure Laterality Date   ABDOMINAL HYSTERECTOMY  1975   APPLICATION OF A-CELL OF EXTREMITY Right 12/10/2014   Procedure: APPLICATION OF A-CELL OF EXTREMITY;  Surgeon: Wayland Denis, DO;  Location: [facility] OR;  Service: Plastics;  Laterality: Right;   arthroscopic knee Right    x 2  BACK SURGERY  2000   spinal fusion   BILATERAL SALPINGECTOMY Bilateral 1989   Dr. Gavin Potters   CARDIAC CATHETERIZATION     EYE SURGERY     cataracts bilateral - removed   HERNIA REPAIR Bilateral    inguinal    I & D EXTREMITY Right 12/10/2014   Procedure: IRRIGATION AND DEBRIDEMENT ON RIGHT LEG, ;  Surgeon: Wayland Denis, DO;  Location: [facility] OR;  Service: Plastics;  Laterality: Right;   I & D EXTREMITY Right 01/20/2015   Procedure: IRRIGATION AND DEBRIDEMENT RIGHT LOWER LEG WOUND, with ACELL  to Donor Site, SKIN GRAFT AND VAC Placement;  Surgeon: Wayland Denis, DO;  Location: [facility] OR;  Service: Plastics;  Laterality: Right;   LUMBAR LAMINECTOMY  02-17-1999   with sinal fusion L3,4,5,&S1   moes     moses surgery on R lle   OOPHORECTOMY  1989   RESECTION DISTAL CLAVICAL     ROTATOR CUFF REPAIR Right 2001   x2   SKIN GRAFT Right 05/29/2018   THYROIDECTOMY     TONSILLECTOMY  1928   TUBAL LIGATION      Allergies  Allergen Reactions   Naproxen     Gi bleed   Nsaids     GI BLEED   Aspirin     GI bleed   Ace Inhibitors     Cough    Caffeine Other (See Comments)    Causes joints to be painful    Outpatient Encounter Medications as of 03/26/2023  Medication Sig   acetaminophen (TYLENOL) 500 MG tablet Take 1,000 mg by mouth 3 (three) times daily.   BOOSTRIX 5-2.5-18.5 LF-MCG/0.5 injection Inject 0.5 mLs into the muscle once.   Carboxymethylcellulose Sodium (THERATEARS) 0.25 % SOLN Apply 2-3 drops to eye in the morning, at noon, in the evening, and at bedtime.   DULoxetine (CYMBALTA) 20 MG capsule Take 1 capsule (20 mg total) by mouth daily.   hydrALAZINE (APRESOLINE) 10 MG tablet Take 10 mg by mouth 2 (two) times daily as needed.  for SBP>180   losartan (COZAAR) 50 MG tablet Take 1.5 tablets (75 mg total) by mouth daily.   methocarbamol (ROBAXIN) 500 MG tablet Take 500 mg by mouth every 6 (six) hours as needed for muscle spasms.   polyethylene glycol powder (GLYCOLAX/MIRALAX) 17 GM/SCOOP powder Take 17 g by mouth daily as needed.   senna (SENOKOT) 8.6 MG TABS tablet Take 2 tablets by mouth at bedtime as needed for mild constipation.   No facility-administered encounter medications on file as of 03/26/2023.    Review of Systems  Immunization History  Administered Date(s) Administered   Fluad Quad(high Dose 65+) 04/05/2020   Fluad Trivalent(High Dose 65+) 03/20/2023   Influenza Split 03/08/2011, 03/05/2012   Influenza Whole 03/08/2007, 03/02/2009, 03/29/2010    Influenza, High Dose Seasonal PF 03/21/2013, 06/13/2017, 03/07/2019, 03/26/2020   Influenza,inj,Quad PF,6+ Mos 02/20/2014, 02/11/2015, 03/22/2018   Influenza-Unspecified 03/23/2016, 04/05/2020, 03/03/2022   Moderna Covid-19 Fall Seasonal Vaccine 80yrs & older 09/21/2022   Moderna Covid-19 Vaccine Bivalent Booster 13yrs & up 03/10/2021, 03/20/2023   Moderna SARS-COV2 Booster Vaccination 09/20/2020   Moderna Sars-Covid-2 Vaccination 06/10/2019, 07/09/2019, 10/13/2021, 04/03/2022   PFIZER(Purple Top)SARS-COV-2 Vaccination 04/05/2020   Pneumococcal Conjugate-13 07/30/2014   Pneumococcal Polysaccharide-23 02/03/2002, 12/16/2009   Td 02/03/2002   Tdap 12/11/2012, 12/23/2022   Zoster Recombinant(Shingrix) 05/10/2022, 07/11/2022   Pertinent  Health Maintenance Due  Topic Date Due   INFLUENZA VACCINE  Completed   DEXA SCAN  Completed  05/19/2022   10:07 AM 08/08/2022    1:26 PM 11/20/2022    1:03 PM 12/22/2022    9:56 AM 01/09/2023    2:29 PM  Fall Risk  Falls in the past year? 1 1 1 1 1   Was there an injury with Fall? 0 1 1 1  0  Fall Risk Category Calculator 2 2 3 3 1   Fall Risk Category (Retired) Moderate      (RETIRED) Patient Fall Risk Level High fall risk      Patient at Risk for Falls Due to Impaired balance/gait;Impaired mobility History of fall(s) History of fall(s);Impaired balance/gait;Impaired mobility;Impaired vision History of fall(s);Impaired balance/gait History of fall(s);Impaired balance/gait  Fall risk Follow up Falls evaluation completed Falls evaluation completed Falls evaluation completed Falls evaluation completed Falls evaluation completed;Education provided   Functional Status Survey:    Vitals:   03/26/23 1133  BP: (!) 150/89  Pulse: 85  Resp: 17  Temp: (!) 97.2 F (36.2 C)  TempSrc: Temporal  SpO2: 93%  Weight: 124 lb (56.2 kg)  Height: 4\' 11"  (1.499 m)   Body mass index is 25.04 kg/m. Physical Exam  Labs reviewed: Recent Labs    04/18/22 1345  08/08/22 0000 02/02/23 0000  NA 143 138 140  K 4.4 4.0 4.7  CL 103 101 104  CO2 25* 26* 22  BUN 14 21 25*  CREATININE 0.6 0.6 0.7  CALCIUM 10.2 9.4 8.7   No results for input(s): "AST", "ALT", "ALKPHOS", "BILITOT", "PROT", "ALBUMIN" in the last 8760 hours. Recent Labs    04/18/22 1345 08/08/22 0000  WBC 6.1 4.9  HGB 12.9 11.5*  HCT 41 35*  PLT 291 157   Lab Results  Component Value Date   TSH 1.39 01/02/2022   No results found for: "HGBA1C" Lab Results  Component Value Date   CHOL 238 (A) 08/20/2018   HDL 67 08/20/2018   LDLCALC 154 08/20/2018   LDLDIRECT 118.7 07/20/2011   TRIG 85 08/20/2018   CHOLHDL 4 12/30/2013    Significant Diagnostic Results in last 30 days:  No results found.  Assessment/Plan There are no diagnoses linked to this encounter.   Family/ staff Communication: ***  Labs/tests ordered:  ***

## 2023-03-27 ENCOUNTER — Encounter: Payer: Self-pay | Admitting: Adult Health

## 2023-03-27 MED ORDER — TRIAMCINOLONE ACETONIDE 0.1 % EX CREA
1.0000 | TOPICAL_CREAM | Freq: Two times a day (BID) | CUTANEOUS | Status: AC
Start: 2023-03-27 — End: 2023-04-03

## 2023-03-30 ENCOUNTER — Ambulatory Visit: Payer: Medicare HMO

## 2023-03-30 DIAGNOSIS — M810 Age-related osteoporosis without current pathological fracture: Secondary | ICD-10-CM | POA: Diagnosis not present

## 2023-03-30 MED ORDER — DENOSUMAB 60 MG/ML ~~LOC~~ SOSY
60.0000 mg | PREFILLED_SYRINGE | Freq: Once | SUBCUTANEOUS | Status: AC
Start: 2023-03-30 — End: 2023-03-30
  Administered 2023-03-30: 60 mg via SUBCUTANEOUS

## 2023-03-30 MED ORDER — DENOSUMAB 60 MG/ML ~~LOC~~ SOSY
60.0000 mg | PREFILLED_SYRINGE | Freq: Once | SUBCUTANEOUS | Status: DC
Start: 2023-10-01 — End: 2023-09-24

## 2023-04-12 ENCOUNTER — Encounter: Payer: Self-pay | Admitting: Adult Health

## 2023-04-12 ENCOUNTER — Non-Acute Institutional Stay (SKILLED_NURSING_FACILITY): Payer: Medicare HMO | Admitting: Adult Health

## 2023-04-12 DIAGNOSIS — I480 Paroxysmal atrial fibrillation: Secondary | ICD-10-CM | POA: Diagnosis not present

## 2023-04-12 DIAGNOSIS — K5903 Drug induced constipation: Secondary | ICD-10-CM

## 2023-04-12 DIAGNOSIS — I10 Essential (primary) hypertension: Secondary | ICD-10-CM | POA: Diagnosis not present

## 2023-04-12 LAB — BASIC METABOLIC PANEL
BUN: 25 — AB (ref 4–21)
CO2: 24 — AB (ref 13–22)
Chloride: 103 (ref 99–108)
Creatinine: 0.6 (ref 0.5–1.1)
Glucose: 157
Potassium: 4.5 meq/L (ref 3.5–5.1)
Sodium: 137 (ref 137–147)

## 2023-04-12 LAB — CBC AND DIFFERENTIAL
HCT: 35 — AB (ref 36–46)
Hemoglobin: 11.4 — AB (ref 12.0–16.0)
Platelets: 151 10*3/uL (ref 150–400)
WBC: 5

## 2023-04-12 LAB — CBC: RBC: 3.89 (ref 3.87–5.11)

## 2023-04-12 LAB — COMPREHENSIVE METABOLIC PANEL: Calcium: 9.1 (ref 8.7–10.7)

## 2023-04-12 MED ORDER — POLYETHYLENE GLYCOL 3350 17 GM/SCOOP PO POWD
17.0000 g | Freq: Every day | ORAL | Status: DC
Start: 2023-04-12 — End: 2023-04-13

## 2023-04-12 NOTE — Progress Notes (Addendum)
Location:  [facility] Room Number: 135A Place of Service:  SNF [phone]) Provider:  Fletcher Anon, NP    Mahlon Gammon, MD  Patient Care Team: Mahlon Gammon, MD as PCP - General (Internal Medicine) Bernette Redbird, MD as Consulting Physician (Gastroenterology) Dannielle Huh, MD as Consulting Physician (Orthopedic Surgery) Ellen Henri as Consulting Physician (Dermatology) Cassell Clement, MD as Consulting Physician (Cardiology) Maris Berger, MD as Consulting Physician (Ophthalmology) Glennis Brink, MD as Referring Physician (Dermatology)  Extended Emergency Contact Information Primary Emergency Contact: Berton Mount of [place] Home Phone: [phone] Relation: Daughter Secondary Emergency Contact: [facility] Address: [phone] [address] [place] [address] Darden Amber of [place] Home Phone: [phone] Mobile Phone: [phone] Relation: Son  Code Status:  DNR Goals of care: Advanced Directive information    04/12/2023   11:39 AM  Advanced Directives  Does Patient Have a Medical Advance Directive? Yes  Type of Estate agent of [place];Out of facility DNR (pink MOST or yellow form);Living will  Does patient want to make changes to medical advance directive? No - Patient declined  Copy of Healthcare Power of Attorney in Chart? Yes - validated most recent copy scanned in chart (See row information)  Pre-existing out of facility DNR order (yellow form or pink MOST form) Yellow form placed in chart (order not valid for inpatient use);Pink MOST form placed in chart (order not valid for inpatient use)     Chief Complaint  Patient presents with   Acute Visit     tachycardia     HPI:  Pt is a 89 y.o. female seen today for an acute visit for tachycardia and HTN  Resident BP 192/100 pulse 100 at 0130am. Resident was given Hydralazine 10mg  and rechecked BP was 174/84, p 116 at rest.  She was in no acute distress. She denies any palpitations or sob. She is eating and drinking as usual. She has not had afib or flutter before.  A 12 lead EKG showed aflutter at a rate of 90 and complete LBBB. It did not have the typical saw tooth pattern and I would lean more toward afib than flutter.  She was given 12.5 mg of metoprolol tartrate. BP reduced to 130/76 and HR 69.    Past Medical History:  Diagnosis Date   Arthritis    "all over"   BACK PAIN 03/08/2007   Cancer (HCC)    squamous cell on R leg & face- ?basal cell   Cutaneous abscess of right lower limb    DEPRESSIVE DISORDER 12/01/2008   Disruption of external operation (surgical) wound, not elsewhere classified, initial encounter    Duodenal ulcer    Essential (primary) hypertension    GI bleed 09/12/2010   Naproxen induced Endoscopy with dr Marjorie Smolder    HIATAL HERNIA 12/17/2006   History of hiatal hernia    HYPERLIPIDEMIA 03/09/2006   Hyperlipidemia, unspecified    HYPERTENSION 03/09/2006   KNEE PAIN 05/06/2009   Macular degeneration    Major depressive disorder, single episode, unspecified    Pneumonia    48yrs. ago- hosp. pneumonia   Urinary urgency    Past Surgical History:  Procedure Laterality Date   ABDOMINAL HYSTERECTOMY  1975   APPLICATION OF A-CELL OF EXTREMITY Right 12/10/2014   Procedure: APPLICATION OF A-CELL OF EXTREMITY;  Surgeon: Wayland Denis, DO;  Location: [facility] OR;  Service: Plastics;  Laterality: Right;   arthroscopic knee Right  x 2   BACK SURGERY  2000   spinal fusion   BILATERAL SALPINGECTOMY Bilateral 1989   Dr. Gavin Potters   CARDIAC CATHETERIZATION     EYE SURGERY     cataracts bilateral - removed   HERNIA REPAIR Bilateral    inguinal    I & D EXTREMITY Right 12/10/2014   Procedure: IRRIGATION AND DEBRIDEMENT ON RIGHT LEG, ;  Surgeon: Wayland Denis, DO;  Location: [facility] OR;  Service: Plastics;  Laterality: Right;   I & D EXTREMITY Right 01/20/2015   Procedure: IRRIGATION AND DEBRIDEMENT RIGHT  LOWER LEG WOUND, with ACELL to Donor Site, SKIN GRAFT AND VAC Placement;  Surgeon: Wayland Denis, DO;  Location: [facility] OR;  Service: Plastics;  Laterality: Right;   LUMBAR LAMINECTOMY  02-17-1999   with sinal fusion L3,4,5,&S1   moes     moses surgery on R lle   OOPHORECTOMY  1989   RESECTION DISTAL CLAVICAL     ROTATOR CUFF REPAIR Right 2001   x2   SKIN GRAFT Right 05/29/2018   THYROIDECTOMY     TONSILLECTOMY  1928   TUBAL LIGATION      Allergies  Allergen Reactions   Naproxen     Gi bleed   Nsaids     GI BLEED   Aspirin     GI bleed   Ace Inhibitors     Cough    Caffeine Other (See Comments)    Causes joints to be painful    Outpatient Encounter Medications as of 04/12/2023  Medication Sig   acetaminophen (TYLENOL) 500 MG tablet Take 1,000 mg by mouth 3 (three) times daily.   Carboxymethylcellulose Sodium (THERATEARS) 0.25 % SOLN Apply 2-3 drops to eye in the morning, at noon, in the evening, and at bedtime.   losartan (COZAAR) 50 MG tablet Take 1.5 tablets (75 mg total) by mouth daily.   methocarbamol (ROBAXIN) 500 MG tablet Take 500 mg by mouth every 6 (six) hours as needed for muscle spasms.   polyethylene glycol powder (GLYCOLAX/MIRALAX) 17 GM/SCOOP powder Take 17 g by mouth daily as needed.   senna (SENOKOT) 8.6 MG TABS tablet Take 2 tablets by mouth at bedtime as needed for mild constipation.   BOOSTRIX 5-2.5-18.5 LF-MCG/0.5 injection Inject 0.5 mLs into the muscle once. (Patient not taking: Reported on 04/12/2023)   DULoxetine (CYMBALTA) 20 MG capsule Take 1 capsule (20 mg total) by mouth daily. (Patient not taking: Reported on 04/12/2023)   hydrALAZINE (APRESOLINE) 10 MG tablet Take 10 mg by mouth 2 (two) times daily as needed.  for SBP>180 (Patient not taking: Reported on 04/12/2023)   Facility-Administered Encounter Medications as of 04/12/2023  Medication   [START ON 10/01/2023] denosumab (PROLIA) injection 60 mg    Review of Systems  Constitutional:  Negative  for activity change, appetite change, chills, diaphoresis, fatigue, fever and unexpected weight change.  HENT:  Negative for congestion.   Respiratory:  Negative for cough, shortness of breath and wheezing.   Cardiovascular:  Negative for chest pain, palpitations and leg swelling.  Gastrointestinal:  Negative for abdominal distention, abdominal pain, constipation and diarrhea.  Genitourinary:  Negative for difficulty urinating and dysuria.  Musculoskeletal:  Positive for arthralgias and gait problem. Negative for back pain, joint swelling and myalgias.  Neurological:  Negative for dizziness, tremors, seizures, syncope, facial asymmetry, speech difficulty, weakness, light-headedness, numbness and headaches.  Psychiatric/Behavioral:  Positive for confusion. Negative for agitation and behavioral problems.     Immunization History  Administered Date(s) Administered  Fluad Quad(high Dose 65+) 04/05/2020   Fluad Trivalent(High Dose 65+) 03/20/2023   Influenza Split 03/08/2011, 03/05/2012   Influenza Whole 03/08/2007, 03/02/2009, 03/29/2010   Influenza, High Dose Seasonal PF 03/21/2013, 06/13/2017, 03/07/2019, 03/26/2020   Influenza,inj,Quad PF,6+ Mos 02/20/2014, 02/11/2015, 03/22/2018   Influenza-Unspecified 03/23/2016, 04/05/2020, 03/03/2022   Moderna Covid-19 Fall Seasonal Vaccine 44yrs & older 09/21/2022   Moderna Covid-19 Vaccine Bivalent Booster 23yrs & up 03/10/2021, 03/20/2023   Moderna SARS-COV2 Booster Vaccination 09/20/2020   Moderna Sars-Covid-2 Vaccination 06/10/2019, 07/09/2019, 10/13/2021, 04/03/2022   PFIZER(Purple Top)SARS-COV-2 Vaccination 04/05/2020   Pneumococcal Conjugate-13 07/30/2014   Pneumococcal Polysaccharide-23 02/03/2002, 12/16/2009   Td 02/03/2002   Tdap 12/11/2012, 12/23/2022   Zoster Recombinant(Shingrix) 05/10/2022, 07/11/2022   Pertinent  Health Maintenance Due  Topic Date Due   INFLUENZA VACCINE  Completed   DEXA SCAN  Completed      05/19/2022    10:07 AM 08/08/2022    1:26 PM 11/20/2022    1:03 PM 12/22/2022    9:56 AM 01/09/2023    2:29 PM  Fall Risk  Falls in the past year? 1 1 1 1 1   Was there an injury with Fall? 0 1 1 1  0  Fall Risk Category Calculator 2 2 3 3 1   Fall Risk Category (Retired) Moderate      (RETIRED) Patient Fall Risk Level High fall risk      Patient at Risk for Falls Due to Impaired balance/gait;Impaired mobility History of fall(s) History of fall(s);Impaired balance/gait;Impaired mobility;Impaired vision History of fall(s);Impaired balance/gait History of fall(s);Impaired balance/gait  Fall risk Follow up Falls evaluation completed Falls evaluation completed Falls evaluation completed Falls evaluation completed Falls evaluation completed;Education provided   Functional Status Survey:    Vitals:   04/12/23 1046  BP: 122/79  Pulse: 90  Resp: 17  Temp: (!) 97.5 F (36.4 C)  SpO2: 93%  Weight: 123 lb 12.8 oz (56.2 kg)  Height: 4\' 11"  (1.499 m)   Body mass index is 25 kg/m. Physical Exam Vitals and nursing note reviewed.  Constitutional:      General: She is not in acute distress.    Appearance: She is not diaphoretic.  HENT:     Head: Normocephalic and atraumatic.  Neck:     Vascular: No JVD.  Cardiovascular:     Rate and Rhythm: Tachycardia present. Rhythm irregular.     Heart sounds: No murmur heard. Pulmonary:     Effort: Pulmonary effort is normal. No respiratory distress.     Breath sounds: Normal breath sounds. No wheezing.  Abdominal:     General: Bowel sounds are normal. There is no distension.     Palpations: Abdomen is soft.     Tenderness: There is no abdominal tenderness. There is no right CVA tenderness or left CVA tenderness.  Musculoskeletal:     Right lower leg: No edema.     Left lower leg: No edema.  Skin:    General: Skin is warm and dry.  Neurological:     General: No focal deficit present.     Mental Status: She is alert. Mental status is at baseline.     Labs  reviewed: Recent Labs    04/18/22 1345 08/08/22 0000 02/02/23 0000  NA 143 138 140  K 4.4 4.0 4.7  CL 103 101 104  CO2 25* 26* 22  BUN 14 21 25*  CREATININE 0.6 0.6 0.7  CALCIUM 10.2 9.4 8.7   No results for input(s): "AST", "ALT", "ALKPHOS", "BILITOT", "PROT", "ALBUMIN" in  the last 8760 hours. Recent Labs    04/18/22 1345 08/08/22 0000  WBC 6.1 4.9  HGB 12.9 11.5*  HCT 41 35*  PLT 291 157   Lab Results  Component Value Date   TSH 1.39 01/02/2022   No results found for: "HGBA1C" Lab Results  Component Value Date   CHOL 238 (A) 08/20/2018   HDL 67 08/20/2018   LDLCALC 154 08/20/2018   LDLDIRECT 118.7 07/20/2011   TRIG 85 08/20/2018   CHOLHDL 4 12/30/2013    Significant Diagnostic Results in last 30 days:  No results found.  Assessment/Plan  1. Paroxysmal atrial fibrillation (HCC) Remains irregular on exam. Rate was 102 apical at recheck after metoprolol Will start metoprolol tartrate 12.5 mg bid hold for HR <60 D/C hydralazine  2. Drug-induced constipation  - polyethylene glycol powder (GLYCOLAX/MIRALAX) 17 GM/SCOOP powder; Take 17 g by mouth every other day.  3. Essential hypertension Improved after metoprolol  See #1   Family/ staff Communication: discussed with son Jonny Ruiz via phone (he is a retired Development worker, community) and other family members. At this time due to her age and goals of care they do not wish to pursue a cardiology referral or aggressive measures at this time.   Labs/tests ordered:  CBC BMP TSH

## 2023-04-13 LAB — TSH: TSH: 1.74 (ref 0.41–5.90)

## 2023-04-13 MED ORDER — POLYETHYLENE GLYCOL 3350 17 GM/SCOOP PO POWD
17.0000 g | ORAL | Status: DC
Start: 2023-04-13 — End: 2024-01-04

## 2023-04-13 NOTE — Addendum Note (Signed)
Addended by: Esmond Camper on: 04/13/2023 08:31 AM   Modules accepted: Orders

## 2023-04-18 DIAGNOSIS — D485 Neoplasm of uncertain behavior of skin: Secondary | ICD-10-CM | POA: Diagnosis not present

## 2023-04-20 DIAGNOSIS — N390 Urinary tract infection, site not specified: Secondary | ICD-10-CM | POA: Diagnosis not present

## 2023-04-23 ENCOUNTER — Non-Acute Institutional Stay (SKILLED_NURSING_FACILITY): Payer: Self-pay | Admitting: Adult Health

## 2023-04-23 ENCOUNTER — Encounter: Payer: Self-pay | Admitting: Adult Health

## 2023-04-23 DIAGNOSIS — I10 Essential (primary) hypertension: Secondary | ICD-10-CM | POA: Diagnosis not present

## 2023-04-23 DIAGNOSIS — I480 Paroxysmal atrial fibrillation: Secondary | ICD-10-CM

## 2023-04-23 DIAGNOSIS — N3001 Acute cystitis with hematuria: Secondary | ICD-10-CM | POA: Diagnosis not present

## 2023-04-23 MED ORDER — SULFAMETHOXAZOLE-TRIMETHOPRIM 800-160 MG PO TABS: 1.0000 | ORAL_TABLET | Freq: Two times a day (BID) | ORAL | Status: DC

## 2023-04-23 NOTE — Progress Notes (Signed)
Location:  [facility] Room Number: 135A Place of Service:  SNF [phone]) Provider:  Tamsen Roers, MD  Patient Care Team: Mahlon Gammon, MD as PCP - General (Internal Medicine) Bernette Redbird, MD as Consulting Physician (Gastroenterology) Dannielle Huh, MD as Consulting Physician (Orthopedic Surgery) Ellen Henri as Consulting Physician (Dermatology) Cassell Clement, MD as Consulting Physician (Cardiology) Maris Berger, MD as Consulting Physician (Ophthalmology) Glennis Brink, MD as Referring Physician (Dermatology)  Extended Emergency Contact Information Primary Emergency Contact: Berton Mount of [place] Home Phone: [phone] Relation: Daughter Secondary Emergency Contact: [facility] Address: [phone] [address] [place] [address] Darden Amber of [place] Home Phone: [phone] Mobile Phone: [phone] Relation: Son  Code Status:  DNR Goals of care: Advanced Directive information    04/23/2023    9:45 AM  Advanced Directives  Does Patient Have a Medical Advance Directive? Yes  Type of Estate agent of [place];Out of facility DNR (pink MOST or yellow form);Living will  Does patient want to make changes to medical advance directive? No - Patient declined  Copy of Healthcare Power of Attorney in Chart? Yes - validated most recent copy scanned in chart (See row information)  Pre-existing out of facility DNR order (yellow form or pink MOST form) Yellow form placed in chart (order not valid for inpatient use);Pink MOST form placed in chart (order not valid for inpatient use)     Chief Complaint  Patient presents with   Acute Visit    Patient is being seen for a UTI    HPI:  Pt is a 89 y.o. female seen today for an acute visit for UTI  UA obtained due to hematuria and confusion which showed >100,000 Colonies ESBL Ecoli She no longer has visual blood in the  urine but the UA did confirm the presence of RBCs She denies any dysuria, flank pain or bladder pain.  She has chronic confusion which is slightly worse from baseline.   Also she was placed on metoprolol 12.5 mg bid for afib/flutter. Her rate is 65-90.  BP is improved but still elevated at times. 150-170/70-90 She is not having any sob or edema.    Past Medical History:  Diagnosis Date   Arthritis    "all over"   BACK PAIN 03/08/2007   Cancer (HCC)    squamous cell on R leg & face- ?basal cell   Cutaneous abscess of right lower limb    DEPRESSIVE DISORDER 12/01/2008   Disruption of external operation (surgical) wound, not elsewhere classified, initial encounter    Duodenal ulcer    Essential (primary) hypertension    GI bleed 09/12/2010   Naproxen induced Endoscopy with dr Marjorie Smolder    HIATAL HERNIA 12/17/2006   History of hiatal hernia    HYPERLIPIDEMIA 03/09/2006   Hyperlipidemia, unspecified    HYPERTENSION 03/09/2006   KNEE PAIN 05/06/2009   Macular degeneration    Major depressive disorder, single episode, unspecified    Pneumonia    22yrs. ago- hosp. pneumonia   Urinary urgency    Past Surgical History:  Procedure Laterality Date   ABDOMINAL HYSTERECTOMY  1975   APPLICATION OF A-CELL OF EXTREMITY Right 12/10/2014   Procedure: APPLICATION OF A-CELL OF EXTREMITY;  Surgeon: Wayland Denis, DO;  Location: [facility] OR;  Service: Plastics;  Laterality: Right;   arthroscopic knee Right    x 2   BACK SURGERY  2000  spinal fusion   BILATERAL SALPINGECTOMY Bilateral 1989   Dr. Gavin Potters   CARDIAC CATHETERIZATION     EYE SURGERY     cataracts bilateral - removed   HERNIA REPAIR Bilateral    inguinal    I & D EXTREMITY Right 12/10/2014   Procedure: IRRIGATION AND DEBRIDEMENT ON RIGHT LEG, ;  Surgeon: Wayland Denis, DO;  Location: [facility] OR;  Service: Plastics;  Laterality: Right;   I & D EXTREMITY Right 01/20/2015   Procedure: IRRIGATION AND DEBRIDEMENT RIGHT LOWER LEG WOUND, with ACELL to  Donor Site, SKIN GRAFT AND VAC Placement;  Surgeon: Wayland Denis, DO;  Location: [facility] OR;  Service: Plastics;  Laterality: Right;   LUMBAR LAMINECTOMY  02-17-1999   with sinal fusion L3,4,5,&S1   moes     moses surgery on R lle   OOPHORECTOMY  1989   RESECTION DISTAL CLAVICAL     ROTATOR CUFF REPAIR Right 2001   x2   SKIN GRAFT Right 05/29/2018   THYROIDECTOMY     TONSILLECTOMY  1928   TUBAL LIGATION      Allergies  Allergen Reactions   Naproxen     Gi bleed   Nsaids     GI BLEED   Aspirin     GI bleed   Ace Inhibitors     Cough    Caffeine Other (See Comments)    Causes joints to be painful    Outpatient Encounter Medications as of 04/23/2023  Medication Sig   acetaminophen (TYLENOL) 500 MG tablet Take 1,000 mg by mouth 3 (three) times daily.   Carboxymethylcellulose Sodium (THERATEARS) 0.25 % SOLN Apply 2-3 drops to eye in the morning, at noon, in the evening, and at bedtime.   DULoxetine (CYMBALTA) 20 MG capsule Take 1 capsule (20 mg total) by mouth daily.   losartan (COZAAR) 50 MG tablet Take 1.5 tablets (75 mg total) by mouth daily.   methocarbamol (ROBAXIN) 500 MG tablet Take 500 mg by mouth every 6 (six) hours as needed for muscle spasms.   metoprolol tartrate (LOPRESSOR) 25 MG tablet Take 12.5 mg by mouth 2 (two) times daily. Hold for HR <60   polyethylene glycol powder (GLYCOLAX/MIRALAX) 17 GM/SCOOP powder Take 17 g by mouth every other day.   senna (SENOKOT) 8.6 MG TABS tablet Take 2 tablets by mouth at bedtime as needed for mild constipation.   Facility-Administered Encounter Medications as of 04/23/2023  Medication   [START ON 10/01/2023] denosumab (PROLIA) injection 60 mg    Review of Systems  Constitutional:  Negative for activity change, appetite change, chills, diaphoresis, fatigue, fever and unexpected weight change.  HENT:  Negative for congestion.   Respiratory:  Negative for cough, shortness of breath and wheezing.   Cardiovascular:  Negative for  chest pain, palpitations and leg swelling.  Gastrointestinal:  Negative for abdominal distention, abdominal pain, constipation and diarrhea.  Genitourinary:  Positive for hematuria. Negative for decreased urine volume, difficulty urinating, dysuria, flank pain, frequency, pelvic pain, urgency, vaginal bleeding and vaginal discharge.  Musculoskeletal:  Positive for arthralgias and gait problem. Negative for back pain, joint swelling and myalgias.  Neurological:  Negative for dizziness, tremors, seizures, syncope, facial asymmetry, speech difficulty, weakness, light-headedness, numbness and headaches.  Psychiatric/Behavioral:  Positive for confusion. Negative for agitation and behavioral problems.     Immunization History  Administered Date(s) Administered   Fluad Quad(high Dose 65+) 04/05/2020   Fluad Trivalent(High Dose 65+) 03/20/2023   Influenza Split 03/08/2011, 03/05/2012   Influenza Whole 03/08/2007, 03/02/2009,  03/29/2010   Influenza, High Dose Seasonal PF 03/21/2013, 06/13/2017, 03/07/2019, 03/26/2020   Influenza,inj,Quad PF,6+ Mos 02/20/2014, 02/11/2015, 03/22/2018   Influenza-Unspecified 03/23/2016, 04/05/2020, 03/03/2022   Moderna Covid-19 Fall Seasonal Vaccine 39yrs & older 09/21/2022   Moderna Covid-19 Vaccine Bivalent Booster 76yrs & up 03/10/2021, 03/20/2023   Moderna SARS-COV2 Booster Vaccination 09/20/2020   Moderna Sars-Covid-2 Vaccination 06/10/2019, 07/09/2019, 10/13/2021, 04/03/2022   PFIZER(Purple Top)SARS-COV-2 Vaccination 04/05/2020   Pneumococcal Conjugate-13 07/30/2014   Pneumococcal Polysaccharide-23 02/03/2002, 12/16/2009   Td 02/03/2002   Tdap 12/11/2012, 12/23/2022   Zoster Recombinant(Shingrix) 05/10/2022, 07/11/2022   Pertinent  Health Maintenance Due  Topic Date Due   INFLUENZA VACCINE  Completed   DEXA SCAN  Completed      08/08/2022    1:26 PM 11/20/2022    1:03 PM 12/22/2022    9:56 AM 01/09/2023    2:29 PM 04/23/2023    9:44 AM  Fall Risk   Falls in the past year? 1 1 1 1  0  Was there an injury with Fall? 1 1 1  0 0  Fall Risk Category Calculator 2 3 3 1  0  Patient at Risk for Falls Due to History of fall(s) History of fall(s);Impaired balance/gait;Impaired mobility;Impaired vision History of fall(s);Impaired balance/gait History of fall(s);Impaired balance/gait No Fall Risks  Fall risk Follow up Falls evaluation completed Falls evaluation completed Falls evaluation completed Falls evaluation completed;Education provided Falls evaluation completed   Functional Status Survey:    Vitals:   04/23/23 0940 04/23/23 1241  BP: (!) 170/95 (!) 154/89  Pulse: 65   Resp: (!) 22   Temp: 97.7 F (36.5 C)   TempSrc: Temporal   SpO2: 93%   Weight: 123 lb 12.8 oz (56.2 kg)   Height: 4\' 11"  (1.499 m)    Body mass index is 25 kg/m. Physical Exam Vitals and nursing note reviewed.  Cardiovascular:     Rate and Rhythm: Normal rate. Rhythm irregular.  Pulmonary:     Effort: Pulmonary effort is normal. No respiratory distress.     Breath sounds: Normal breath sounds.  Abdominal:     General: Bowel sounds are normal. There is no distension.     Palpations: Abdomen is soft.     Tenderness: There is no abdominal tenderness. There is no right CVA tenderness or left CVA tenderness.  Musculoskeletal:     Right lower leg: No edema.     Left lower leg: No edema.  Neurological:     General: No focal deficit present.  Psychiatric:        Mood and Affect: Mood normal.     Labs reviewed: Recent Labs    08/08/22 0000 02/02/23 0000 04/12/23 0000  NA 138 140 137  K 4.0 4.7 4.5  CL 101 104 103  CO2 26* 22 24*  BUN 21 25* 25*  CREATININE 0.6 0.7 0.6  CALCIUM 9.4 8.7 9.1   No results for input(s): "AST", "ALT", "ALKPHOS", "BILITOT", "PROT", "ALBUMIN" in the last 8760 hours. Recent Labs    08/08/22 0000 04/12/23 0000  WBC 4.9 5.0  HGB 11.5* 11.4*  HCT 35* 35*  PLT 157 151   Lab Results  Component Value Date   TSH 1.39  01/02/2022   No results found for: "HGBA1C" Lab Results  Component Value Date   CHOL 238 (A) 08/20/2018   HDL 67 08/20/2018   LDLCALC 154 08/20/2018   LDLDIRECT 118.7 07/20/2011   TRIG 85 08/20/2018   CHOLHDL 4 12/30/2013    Significant Diagnostic Results in  last 30 days:  No results found.  Assessment/Plan  1. Acute cystitis with hematuria Begin bactrim bid for 7 days  Needs precaution with handling urine/personal care due to ESBL  2. Paroxysmal atrial fibrillation (HCC) Continue metoprolol due to improved rate.  Through shared decision making her family opted to forego cardiology referral and/or DOAC due to age and goals of care.   3. Essential hypertension BP is improved but still above goal Will f/u for further management at next visit.  She is fall risk would avoid aggressive treatment.    Family/ staff Communication: discussed with her son at bedside and the nurse  Labs/tests ordered:  NA

## 2023-04-25 DIAGNOSIS — H6123 Impacted cerumen, bilateral: Secondary | ICD-10-CM | POA: Diagnosis not present

## 2023-04-30 ENCOUNTER — Encounter: Payer: Self-pay | Admitting: Adult Health

## 2023-04-30 ENCOUNTER — Non-Acute Institutional Stay (SKILLED_NURSING_FACILITY): Payer: Self-pay | Admitting: Adult Health

## 2023-04-30 DIAGNOSIS — R531 Weakness: Secondary | ICD-10-CM

## 2023-04-30 DIAGNOSIS — S0003XA Contusion of scalp, initial encounter: Secondary | ICD-10-CM

## 2023-04-30 NOTE — Addendum Note (Signed)
Addended by: Esmond Camper on: 04/30/2023 04:16 PM   Modules accepted: Orders

## 2023-04-30 NOTE — Progress Notes (Signed)
Location:  Medical illustrator of Service:  SNF (31) Provider:   Peggye Ley, ANP [facility] [phone]   Mahlon Gammon, MD  Patient Care Team: Mahlon Gammon, MD as PCP - General (Internal Medicine) Bernette Redbird, MD as Consulting Physician (Gastroenterology) Dannielle Huh, MD as Consulting Physician (Orthopedic Surgery) Ellen Henri as Consulting Physician (Dermatology) Cassell Clement, MD as Consulting Physician (Cardiology) Maris Berger, MD as Consulting Physician (Ophthalmology) Glennis Brink, MD as Referring Physician (Dermatology)  Extended Emergency Contact Information Primary Emergency Contact: Berton Mount of [place] Home Phone: [phone] Relation: Daughter Secondary Emergency Contact: [facility] Address: [phone] [address], [place] [address] Darden Amber of [place] Home Phone: [phone] Mobile Phone: [phone] Relation: Son  Code Status:  DNR Goals of care: Advanced Directive information    04/23/2023    9:45 AM  Advanced Directives  Does Patient Have a Medical Advance Directive? Yes  Type of Estate agent of [place];Out of facility DNR (pink MOST or yellow form);Living will  Does patient want to make changes to medical advance directive? No - Patient declined  Copy of Healthcare Power of Attorney in Chart? Yes - validated most recent copy scanned in chart (See row information)  Pre-existing out of facility DNR order (yellow form or pink MOST form) Yellow form placed in chart (order not valid for inpatient use);Pink MOST form placed in chart (order not valid for inpatient use)     Chief Complaint  Patient presents with   Acute Visit    Fall with hematoma    HPI:  Pt is a 89 y.o. female seen today for an acute visit for fall with hematoma.   Ms Brandli was found on the floor on 12/1.  She lives in skilled care due to progressive memory loss and  falls.  She hit the back of her head. Due to her memory loss its unclear why she fell. She was alert when found. No joint pain. Did have a hematoma to the back of her head. Normal neuro exam. Family declined ED evaluation.   This am she is at baseline in terms of cognition. Eating and drinking well. Denies any nausea, blurry vision, headache, etc. The staff did use a lift for tranfers due to weakness. She had progressively been requiring more help with transfers and ambulation.  She completed Bactrim for a UTI on 04/29/23 UA obtained due to hematuria and confusion which showed >100,000 Colonies ESBL Ecoli  She denies any dysuria or hematuria. No reports of concerns per staff.   Also she was placed on metoprolol 12.5 mg bid for afib/flutter. HR and BP have improved.  No symptoms of palpitations, sob, edema etc.   Past Medical History:  Diagnosis Date   Arthritis    "all over"   BACK PAIN 03/08/2007   Cancer (HCC)    squamous cell on R leg & face- ?basal cell   Cutaneous abscess of right lower limb    DEPRESSIVE DISORDER 12/01/2008   Disruption of external operation (surgical) wound, not elsewhere classified, initial encounter    Duodenal ulcer    Essential (primary) hypertension    GI bleed 09/12/2010   Naproxen induced Endoscopy with dr Marjorie Smolder    HIATAL HERNIA 12/17/2006   History of hiatal hernia    HYPERLIPIDEMIA 03/09/2006   Hyperlipidemia, unspecified    HYPERTENSION 03/09/2006   KNEE PAIN 05/06/2009   Macular degeneration  Major depressive disorder, single episode, unspecified    Pneumonia    59yrs. ago- hosp. pneumonia   Urinary urgency    Past Surgical History:  Procedure Laterality Date   ABDOMINAL HYSTERECTOMY  1975   APPLICATION OF A-CELL OF EXTREMITY Right 12/10/2014   Procedure: APPLICATION OF A-CELL OF EXTREMITY;  Surgeon: Wayland Denis, DO;  Location: [facility] OR;  Service: Plastics;  Laterality: Right;   arthroscopic knee Right    x 2   BACK SURGERY  2000    spinal fusion   BILATERAL SALPINGECTOMY Bilateral 1989   Dr. Gavin Potters   CARDIAC CATHETERIZATION     EYE SURGERY     cataracts bilateral - removed   HERNIA REPAIR Bilateral    inguinal    I & D EXTREMITY Right 12/10/2014   Procedure: IRRIGATION AND DEBRIDEMENT ON RIGHT LEG, ;  Surgeon: Wayland Denis, DO;  Location: [facility] OR;  Service: Plastics;  Laterality: Right;   I & D EXTREMITY Right 01/20/2015   Procedure: IRRIGATION AND DEBRIDEMENT RIGHT LOWER LEG WOUND, with ACELL to Donor Site, SKIN GRAFT AND VAC Placement;  Surgeon: Wayland Denis, DO;  Location: [facility] OR;  Service: Plastics;  Laterality: Right;   LUMBAR LAMINECTOMY  02-17-1999   with sinal fusion L3,4,5,&S1   moes     moses surgery on R lle   OOPHORECTOMY  1989   RESECTION DISTAL CLAVICAL     ROTATOR CUFF REPAIR Right 2001   x2   SKIN GRAFT Right 05/29/2018   THYROIDECTOMY     TONSILLECTOMY  1928   TUBAL LIGATION      Allergies  Allergen Reactions   Naproxen     Gi bleed   Nsaids     GI BLEED   Aspirin     GI bleed   Ace Inhibitors     Cough    Caffeine Other (See Comments)    Causes joints to be painful    Outpatient Encounter Medications as of 04/30/2023  Medication Sig   acetaminophen (TYLENOL) 500 MG tablet Take 1,000 mg by mouth 3 (three) times daily.   Carboxymethylcellulose Sodium (THERATEARS) 0.25 % SOLN Apply 2-3 drops to eye in the morning, at noon, in the evening, and at bedtime.   DULoxetine (CYMBALTA) 20 MG capsule Take 1 capsule (20 mg total) by mouth daily.   losartan (COZAAR) 50 MG tablet Take 1.5 tablets (75 mg total) by mouth daily.   methocarbamol (ROBAXIN) 500 MG tablet Take 500 mg by mouth every 6 (six) hours as needed for muscle spasms.   metoprolol tartrate (LOPRESSOR) 25 MG tablet Take 12.5 mg by mouth 2 (two) times daily. Hold for HR <60   polyethylene glycol powder (GLYCOLAX/MIRALAX) 17 GM/SCOOP powder Take 17 g by mouth every other day.   senna (SENOKOT) 8.6 MG TABS tablet Take 2 tablets by  mouth at bedtime as needed for mild constipation.   sulfamethoxazole-trimethoprim (BACTRIM DS) 800-160 MG tablet Take 1 tablet by mouth 2 (two) times daily.   Facility-Administered Encounter Medications as of 04/30/2023  Medication   [START ON 10/01/2023] denosumab (PROLIA) injection 60 mg    Review of Systems  Constitutional:  Positive for activity change. Negative for appetite change, chills, diaphoresis, fatigue, fever and unexpected weight change.  HENT:  Negative for congestion.   Respiratory:  Negative for cough, shortness of breath and wheezing.   Cardiovascular:  Negative for chest pain, palpitations and leg swelling.  Gastrointestinal:  Negative for abdominal distention, abdominal pain, constipation and diarrhea.  Genitourinary:  Negative for difficulty urinating and dysuria.  Musculoskeletal:  Positive for gait problem. Negative for arthralgias, back pain, joint swelling and myalgias.  Neurological:  Positive for weakness (generally). Negative for dizziness, tremors, seizures, syncope, facial asymmetry, speech difficulty, light-headedness, numbness and headaches.  Psychiatric/Behavioral:  Positive for confusion. Negative for agitation and behavioral problems.     Immunization History  Administered Date(s) Administered   Fluad Quad(high Dose 65+) 04/05/2020   Fluad Trivalent(High Dose 65+) 03/20/2023   Influenza Split 03/08/2011, 03/05/2012   Influenza Whole 03/08/2007, 03/02/2009, 03/29/2010   Influenza, High Dose Seasonal PF 03/21/2013, 06/13/2017, 03/07/2019, 03/26/2020   Influenza,inj,Quad PF,6+ Mos 02/20/2014, 02/11/2015, 03/22/2018   Influenza-Unspecified 03/23/2016, 04/05/2020, 03/03/2022   Moderna Covid-19 Fall Seasonal Vaccine 75yrs & older 09/21/2022   Moderna Covid-19 Vaccine Bivalent Booster 26yrs & up 03/10/2021, 03/20/2023   Moderna SARS-COV2 Booster Vaccination 09/20/2020   Moderna Sars-Covid-2 Vaccination 06/10/2019, 07/09/2019, 10/13/2021, 04/03/2022    PFIZER(Purple Top)SARS-COV-2 Vaccination 04/05/2020   Pneumococcal Conjugate-13 07/30/2014   Pneumococcal Polysaccharide-23 02/03/2002, 12/16/2009   Td 02/03/2002   Tdap 12/11/2012, 12/23/2022   Zoster Recombinant(Shingrix) 05/10/2022, 07/11/2022   Pertinent  Health Maintenance Due  Topic Date Due   INFLUENZA VACCINE  Completed   DEXA SCAN  Completed      08/08/2022    1:26 PM 11/20/2022    1:03 PM 12/22/2022    9:56 AM 01/09/2023    2:29 PM 04/23/2023    9:44 AM  Fall Risk  Falls in the past year? 1 1 1 1  0  Was there an injury with Fall? 1 1 1  0 0  Fall Risk Category Calculator 2 3 3 1  0  Patient at Risk for Falls Due to History of fall(s) History of fall(s);Impaired balance/gait;Impaired mobility;Impaired vision History of fall(s);Impaired balance/gait History of fall(s);Impaired balance/gait No Fall Risks  Fall risk Follow up Falls evaluation completed Falls evaluation completed Falls evaluation completed Falls evaluation completed;Education provided Falls evaluation completed   Functional Status Survey:    Vitals:   04/30/23 1537 04/30/23 1610  BP: (!) 150/94 131/76  Pulse: 90   Resp: 20   Temp: (!) 97 F (36.1 C)   SpO2: 92%    There is no height or weight on file to calculate BMI. Physical Exam Vitals and nursing note reviewed.  Constitutional:      General: She is not in acute distress.    Appearance: She is not diaphoretic.  HENT:     Head:     Comments: Small hematoma to posterior scalp. No bleeding.no tenderness.  Eyes:     Extraocular Movements: Extraocular movements intact.     Conjunctiva/sclera: Conjunctivae normal.     Pupils: Pupils are equal, round, and reactive to light.  Neck:     Vascular: No JVD.  Cardiovascular:     Rate and Rhythm: Normal rate. Rhythm irregular.     Heart sounds: No murmur heard. Pulmonary:     Effort: Pulmonary effort is normal. No respiratory distress.     Breath sounds: Normal breath sounds. No wheezing.   Musculoskeletal:        General: No swelling, tenderness, deformity or signs of injury.     Right lower leg: No edema.     Left lower leg: No edema.  Skin:    General: Skin is warm and dry.  Neurological:     General: No focal deficit present.     Mental Status: She is alert. Mental status is at baseline.     Cranial Nerves: No  cranial nerve deficit.  Psychiatric:        Mood and Affect: Mood normal.     Labs reviewed: Recent Labs    08/08/22 0000 02/02/23 0000 04/12/23 0000  NA 138 140 137  K 4.0 4.7 4.5  CL 101 104 103  CO2 26* 22 24*  BUN 21 25* 25*  CREATININE 0.6 0.7 0.6  CALCIUM 9.4 8.7 9.1   No results for input(s): "AST", "ALT", "ALKPHOS", "BILITOT", "PROT", "ALBUMIN" in the last 8760 hours. Recent Labs    08/08/22 0000 04/12/23 0000  WBC 4.9 5.0  HGB 11.5* 11.4*  HCT 35* 35*  PLT 157 151   Lab Results  Component Value Date   TSH 1.39 01/02/2022   No results found for: "HGBA1C" Lab Results  Component Value Date   CHOL 238 (A) 08/20/2018   HDL 67 08/20/2018   LDLCALC 154 08/20/2018   LDLDIRECT 118.7 07/20/2011   TRIG 85 08/20/2018   CHOLHDL 4 12/30/2013    Significant Diagnostic Results in last 30 days:  No results found.  Assessment/Plan  1. Hematoma of scalp, initial encounter She has a normal neuro exam Not on anticoagulation Stable vitals Continue neuro checks and monitoring.    2. General weakness She has some slow progressive cognitive and functional decline that is not acute.  If she continues to require a lift for tranfers we could consider consulting with therapy.    Family/ staff Communication: discussed with her son at bedside.   Labs/tests ordered:  NA

## 2023-05-09 ENCOUNTER — Telehealth: Payer: Self-pay | Admitting: Orthopedic Surgery

## 2023-05-09 MED ORDER — LIDOCAINE 4 % EX PTCH: 1.0000 | MEDICATED_PATCH | CUTANEOUS | Status: AC

## 2023-05-09 NOTE — Telephone Encounter (Signed)
Nursing calls to report increased lower back pain. She has scheduled tylenol 1000 mg prescribed TID. She has not received last dose. She also had robaxin awhile ago. Reported fall 12/01> injury to chin and head per chart review. Orders for lidocaine 4% patch daily x 7 days given. Advised to give last dose of tylenol. If no improvement in pain, advised to write sbar for provider to see tomorrow.

## 2023-05-14 DIAGNOSIS — H35033 Hypertensive retinopathy, bilateral: Secondary | ICD-10-CM | POA: Diagnosis not present

## 2023-05-14 DIAGNOSIS — H353113 Nonexudative age-related macular degeneration, right eye, advanced atrophic without subfoveal involvement: Secondary | ICD-10-CM | POA: Diagnosis not present

## 2023-05-14 DIAGNOSIS — H43813 Vitreous degeneration, bilateral: Secondary | ICD-10-CM | POA: Diagnosis not present

## 2023-05-14 DIAGNOSIS — H353221 Exudative age-related macular degeneration, left eye, with active choroidal neovascularization: Secondary | ICD-10-CM | POA: Diagnosis not present

## 2023-05-14 DIAGNOSIS — H4311 Vitreous hemorrhage, right eye: Secondary | ICD-10-CM | POA: Diagnosis not present

## 2023-05-16 DIAGNOSIS — L853 Xerosis cutis: Secondary | ICD-10-CM | POA: Diagnosis not present

## 2023-05-16 DIAGNOSIS — D485 Neoplasm of uncertain behavior of skin: Secondary | ICD-10-CM | POA: Diagnosis not present

## 2023-05-16 DIAGNOSIS — L570 Actinic keratosis: Secondary | ICD-10-CM | POA: Diagnosis not present

## 2023-06-11 ENCOUNTER — Encounter: Payer: Self-pay | Admitting: Internal Medicine

## 2023-06-11 ENCOUNTER — Non-Acute Institutional Stay (SKILLED_NURSING_FACILITY): Payer: Self-pay | Admitting: Internal Medicine

## 2023-06-11 DIAGNOSIS — I10 Essential (primary) hypertension: Secondary | ICD-10-CM

## 2023-06-11 DIAGNOSIS — G3184 Mild cognitive impairment, so stated: Secondary | ICD-10-CM | POA: Diagnosis not present

## 2023-06-11 DIAGNOSIS — F424 Excoriation (skin-picking) disorder: Secondary | ICD-10-CM | POA: Diagnosis not present

## 2023-06-11 DIAGNOSIS — G8929 Other chronic pain: Secondary | ICD-10-CM

## 2023-06-11 DIAGNOSIS — I480 Paroxysmal atrial fibrillation: Secondary | ICD-10-CM

## 2023-06-11 DIAGNOSIS — M545 Low back pain, unspecified: Secondary | ICD-10-CM

## 2023-06-11 DIAGNOSIS — M810 Age-related osteoporosis without current pathological fracture: Secondary | ICD-10-CM

## 2023-06-11 DIAGNOSIS — K5909 Other constipation: Secondary | ICD-10-CM | POA: Diagnosis not present

## 2023-06-11 NOTE — Progress Notes (Signed)
 Location:  [facility] Service:  SNF ([id number])  Provider:   Code Status: DNR Goals of Care:     04/23/2023    9:45 AM  Advanced Directives  Does Patient Have a Medical Advance Directive? Yes  Type of Estate Agent of [place];Out of facility DNR (pink MOST or yellow form);Living will  Does patient want to make changes to medical advance directive? No - Patient declined  Copy of Healthcare Power of Attorney in Chart? Yes - validated most recent copy scanned in chart (See row information)  Pre-existing out of facility DNR order (yellow form or pink MOST form) Yellow form placed in chart (order not valid for inpatient use);Pink MOST form placed in chart (order not valid for inpatient use)     Chief Complaint  Patient presents with   Care Management    HPI: Patient is a 89 y.o. female seen today for medical management of chronic diseases.    Lives in SNF in [place]  Patient has a history of osteoporosis, trochanteric bursitis, left knee osteoarthritis, depression, hypertension      She is stable. No new Nursing issues. No Behavior issues She does pick on her Chin sometimes and now has Dressing in that area Her weight is stable Stays in her Wheelchair Her Cognition has worsen Also Has some aphasia Wt Readings from Last 3 Encounters:  04/23/23 123 lb 12.8 oz (56.2 kg)  04/12/23 123 lb 12.8 oz (56.2 kg)  03/26/23 124 lb (56.2 kg)  Also had episode of ? A Fib in 11/24 Family did not want anything Aggressive She was started on Low doe of Metoprolol and has been doing well  Past Medical History:  Diagnosis Date   Arthritis    all over   BACK PAIN 03/08/2007   Cancer (HCC)    squamous cell on R leg & face- ?basal cell   Cutaneous abscess of right lower limb    DEPRESSIVE DISORDER 12/01/2008   Disruption of external operation (surgical) wound, not elsewhere classified, initial encounter    Duodenal ulcer    Essential  (primary) hypertension    GI bleed 09/12/2010   Naproxen induced Endoscopy with dr jilda    HIATAL HERNIA 12/17/2006   History of hiatal hernia    HYPERLIPIDEMIA 03/09/2006   Hyperlipidemia, unspecified    HYPERTENSION 03/09/2006   KNEE PAIN 05/06/2009   Macular degeneration    Major depressive disorder, single episode, unspecified    Pneumonia    65yrs. ago- hosp. pneumonia   Urinary urgency     Past Surgical History:  Procedure Laterality Date   ABDOMINAL HYSTERECTOMY  1975   APPLICATION OF A-CELL OF EXTREMITY Right 12/10/2014   Procedure: APPLICATION OF A-CELL OF EXTREMITY;  Surgeon: Estefana Reichert, DO;  Location: [facility] OR;  Service: Plastics;  Laterality: Right;   arthroscopic knee Right    x 2   BACK SURGERY  2000   spinal fusion   BILATERAL SALPINGECTOMY Bilateral 1989   Dr. Ted   CARDIAC CATHETERIZATION     EYE SURGERY     cataracts bilateral - removed   HERNIA REPAIR Bilateral    inguinal    I & D EXTREMITY Right 12/10/2014   Procedure: IRRIGATION AND DEBRIDEMENT ON RIGHT LEG, ;  Surgeon: Estefana Reichert, DO;  Location: [facility];  Service: Plastics;  Laterality: Right;   I & D EXTREMITY Right 01/20/2015   Procedure: IRRIGATION AND DEBRIDEMENT RIGHT LOWER LEG WOUND, with ACELL to  Donor Site, SKIN GRAFT AND VAC Placement;  Surgeon: Estefana Reichert, DO;  Location: [facility] OR;  Service: Plastics;  Laterality: Right;   LUMBAR LAMINECTOMY  02-17-1999   with sinal fusion L3,4,5,&S1   moes     moses surgery on R lle   OOPHORECTOMY  1989   RESECTION DISTAL CLAVICAL     ROTATOR CUFF REPAIR Right 2001   x2   SKIN GRAFT Right 05/29/2018   THYROIDECTOMY     TONSILLECTOMY  1928   TUBAL LIGATION      Allergies  Allergen Reactions   Naproxen     Gi bleed   Nsaids     GI BLEED   Aspirin     GI bleed   Ace Inhibitors     Cough    Caffeine Other (See Comments)    Causes joints to be painful    Outpatient Encounter Medications as of 06/11/2023  Medication Sig   acetaminophen   (TYLENOL ) 500 MG tablet Take 1,000 mg by mouth 3 (three) times daily.   Carboxymethylcellulose Sodium (THERATEARS) 0.25 % SOLN Apply 2-3 drops to eye daily as needed.   DULoxetine  (CYMBALTA ) 20 MG capsule Take 1 capsule (20 mg total) by mouth daily.   losartan  (COZAAR ) 50 MG tablet Take 1.5 tablets (75 mg total) by mouth daily.   methocarbamol (ROBAXIN) 500 MG tablet Take 500 mg by mouth every 6 (six) hours as needed for muscle spasms.   metoprolol tartrate (LOPRESSOR) 25 MG tablet Take 12.5 mg by mouth 2 (two) times daily. Hold for HR <60   polyethylene glycol powder (GLYCOLAX /MIRALAX ) 17 GM/SCOOP powder Take 17 g by mouth every other day.   senna (SENOKOT) 8.6 MG TABS tablet Take 2 tablets by mouth at bedtime as needed for mild constipation.   Facility-Administered Encounter Medications as of 06/11/2023  Medication   [START ON 10/01/2023] denosumab  (PROLIA ) injection 60 mg    Review of Systems:  Review of Systems  Constitutional:  Negative for activity change and appetite change.  HENT: Negative.    Respiratory:  Negative for cough and shortness of breath.   Cardiovascular:  Negative for leg swelling.  Gastrointestinal:  Negative for constipation.  Genitourinary: Negative.   Musculoskeletal:  Positive for gait problem. Negative for arthralgias and myalgias.  Skin:  Positive for wound.  Neurological:  Negative for dizziness and weakness.  Psychiatric/Behavioral:  Positive for confusion. Negative for dysphoric mood and sleep disturbance.     Health Maintenance  Topic Date Due   COVID-19 Vaccine (9 - 2024-25 season) 05/15/2023   Medicare Annual Wellness (AWV)  12/22/2023   DTaP/Tdap/Td (4 - Td or Tdap) 12/22/2032   Pneumonia Vaccine 28+ Years old  Completed   INFLUENZA VACCINE  Completed   DEXA SCAN  Completed   Zoster Vaccines- Shingrix  Completed   HPV VACCINES  Aged Out    Physical Exam: Vitals:   06/11/23 2053  BP: 128/64  Pulse: 74  Temp: (!) 97 F (36.1 C)   There is  no height or weight on file to calculate BMI. Physical Exam Vitals reviewed.  Constitutional:      Appearance: Normal appearance.  HENT:     Head: Normocephalic.     Nose: Nose normal.     Mouth/Throat:     Mouth: Mucous membranes are moist.     Pharynx: Oropharynx is clear.  Eyes:     Pupils: Pupils are equal, round, and reactive to light.  Cardiovascular:     Rate and Rhythm: Normal  rate and regular rhythm.     Pulses: Normal pulses.     Heart sounds: Normal heart sounds. No murmur heard. Pulmonary:     Effort: Pulmonary effort is normal.     Breath sounds: Normal breath sounds.  Abdominal:     General: Abdomen is flat. Bowel sounds are normal.     Palpations: Abdomen is soft.  Musculoskeletal:        General: No swelling.     Cervical back: Neck supple.  Skin:    General: Skin is warm.  Neurological:     General: No focal deficit present.     Mental Status: She is alert.  Psychiatric:        Mood and Affect: Mood normal.        Thought Content: Thought content normal.     Labs reviewed: Basic Metabolic Panel: Recent Labs    08/08/22 0000 02/02/23 0000 04/12/23 0000  NA 138 140 137  K 4.0 4.7 4.5  CL 101 104 103  CO2 26* 22 24*  BUN 21 25* 25*  CREATININE 0.6 0.7 0.6  CALCIUM  9.4 8.7 9.1   Liver Function Tests: No results for input(s): AST, ALT, ALKPHOS, BILITOT, PROT, ALBUMIN in the last 8760 hours. No results for input(s): LIPASE, AMYLASE in the last 8760 hours. No results for input(s): AMMONIA in the last 8760 hours. CBC: Recent Labs    08/08/22 0000 04/12/23 0000  WBC 4.9 5.0  HGB 11.5* 11.4*  HCT 35* 35*  PLT 157 151   Lipid Panel: No results for input(s): CHOL, HDL, LDLCALC, TRIG, CHOLHDL, LDLDIRECT in the last 8760 hours. No results found for: HGBA1C  Procedures since last visit: No results found.  Assessment/Plan 1. Essential hypertension (Primary) on Metoprolol and Cozaar  Some of her BP running  high Will Check it BID for 2 weeks 2. Paroxysmal atrial fibrillation (HCC) No Aggressive treatment Now on Low dose of Metoprolol  3. Senile osteoporosis Prolia   Nov and May in [facility] office  4. Mild cognitive impairment with memory loss Doing well in SNF  5. Skin picking habit Anxiety and Depression On Cymbalta  If continues consider switching to Zoloft  6. Chronic midline low back pain without sciatica On Tylenol   7. Chronic constipation Miralax     Labs/tests ordered:   Next appt:  10/05/2023

## 2023-06-18 ENCOUNTER — Encounter: Payer: Self-pay | Admitting: Internal Medicine

## 2023-06-18 ENCOUNTER — Non-Acute Institutional Stay (SKILLED_NURSING_FACILITY): Payer: Self-pay | Admitting: Internal Medicine

## 2023-06-18 DIAGNOSIS — G3184 Mild cognitive impairment, so stated: Secondary | ICD-10-CM

## 2023-06-18 DIAGNOSIS — F424 Excoriation (skin-picking) disorder: Secondary | ICD-10-CM | POA: Diagnosis not present

## 2023-06-18 DIAGNOSIS — T148XXA Other injury of unspecified body region, initial encounter: Secondary | ICD-10-CM | POA: Diagnosis not present

## 2023-06-18 NOTE — Progress Notes (Signed)
Location:  [facility] Room Number: 135/A Place of Service:  SNF [phone]) Provider:  Mahlon Gammon, MD  Patient Care Team: Mahlon Gammon, MD as PCP - General (Internal Medicine) Bernette Redbird, MD as Consulting Physician (Gastroenterology) Dannielle Huh, MD as Consulting Physician (Orthopedic Surgery) Ellen Henri as Consulting Physician (Dermatology) Cassell Clement, MD as Consulting Physician (Cardiology) Maris Berger, MD as Consulting Physician (Ophthalmology) Glennis Brink, MD as Referring Physician (Dermatology)  Extended Emergency Contact Information Primary Emergency Contact: Berton Mount of [place] Home Phone: [phone] Relation: Daughter Secondary Emergency Contact: [facility] Address: [phone] [address], [place] [address] D[place] Amber of [place] Home Phone: [phone] Mobile Phone: [phone] Relation: Son  Code Status:  DNR Goals of care: Advanced Directive information    06/18/2023    3:51 PM  Advanced Directives  Does Patient Have a Medical Advance Directive? Yes  Type of Estate agent of [place];Out of facility DNR (pink MOST or yellow form);Living will  Does patient want to make changes to medical advance directive? No - Patient declined  Copy of Healthcare Power of Attorney in Chart? Yes - validated most recent copy scanned in chart (See row information)  Pre-existing out of facility DNR order (yellow form or pink MOST form) Yellow form placed in chart (order not valid for inpatient use);Pink MOST form placed in chart (order not valid for inpatient use)     Chief Complaint  Patient presents with   Acute Visit    wound     HPI:  Pt is a 89 y.o. female seen today for an acute visit for small woun din her Right Upper extremity  Lives in SNF in [place]   Patient has a history of osteoporosis, trochanteric bursitis, left knee osteoarthritis, depression,  hypertension       Patient has developed Picking habit She had been picking on her right upper extremity.  And now that area is red and nurses have noticed some discharge. Patient denied any pain.  She She also has few areas in her chin where she was picking and nurses had dressing on.  It seemed to be almost healed.  Past Medical History:  Diagnosis Date   Arthritis    "all over"   BACK PAIN 03/08/2007   Cancer (HCC)    squamous cell on R leg & face- ?basal cell   Cutaneous abscess of right lower limb    DEPRESSIVE DISORDER 12/01/2008   Disruption of external operation (surgical) wound, not elsewhere classified, initial encounter    Duodenal ulcer    Essential (primary) hypertension    GI bleed 09/12/2010   Naproxen induced Endoscopy with dr Marjorie Smolder    HIATAL HERNIA 12/17/2006   History of hiatal hernia    HYPERLIPIDEMIA 03/09/2006   Hyperlipidemia, unspecified    HYPERTENSION 03/09/2006   KNEE PAIN 05/06/2009   Macular degeneration    Major depressive disorder, single episode, unspecified    Pneumonia    56yrs. ago- hosp. pneumonia   Urinary urgency    Past Surgical History:  Procedure Laterality Date   ABDOMINAL HYSTERECTOMY  1975   APPLICATION OF A-CELL OF EXTREMITY Right 12/10/2014   Procedure: APPLICATION OF A-CELL OF EXTREMITY;  Surgeon: Wayland Denis, DO;  Location: [facility];  Service: Plastics;  Laterality: Right;   arthroscopic knee Right    x 2   BACK SURGERY  2000   spinal fusion   BILATERAL SALPINGECTOMY Bilateral  1989   Dr. Gavin Potters   CARDIAC CATHETERIZATION     EYE SURGERY     cataracts bilateral - removed   HERNIA REPAIR Bilateral    inguinal    I & D EXTREMITY Right 12/10/2014   Procedure: IRRIGATION AND DEBRIDEMENT ON RIGHT LEG, ;  Surgeon: Wayland Denis, DO;  Location: [facility];  Service: Plastics;  Laterality: Right;   I & D EXTREMITY Right 01/20/2015   Procedure: IRRIGATION AND DEBRIDEMENT RIGHT LOWER LEG WOUND, with ACELL to Donor Site, SKIN GRAFT AND VAC  Placement;  Surgeon: Wayland Denis, DO;  Location: [facility];  Service: Plastics;  Laterality: Right;   LUMBAR LAMINECTOMY  02-17-1999   with sinal fusion L3,4,5,&S1   moes     moses surgery on R lle   OOPHORECTOMY  1989   RESECTION DISTAL CLAVICAL     ROTATOR CUFF REPAIR Right 2001   x2   SKIN GRAFT Right 05/29/2018   THYROIDECTOMY     TONSILLECTOMY  1928   TUBAL LIGATION      Allergies  Allergen Reactions   Naproxen     Gi bleed   Nsaids     GI BLEED   Aspirin     GI bleed   Ace Inhibitors     Cough    Caffeine Other (See Comments)    Causes joints to be painful    Outpatient Encounter Medications as of 06/18/2023  Medication Sig   acetaminophen (TYLENOL) 500 MG tablet Take 1,000 mg by mouth 3 (three) times daily.   Carboxymethylcellulose Sodium (THERATEARS) 0.25 % SOLN Apply 2-3 drops to eye daily as needed.   DULoxetine (CYMBALTA) 20 MG capsule Take 1 capsule (20 mg total) by mouth daily.   Emollient (CERAVE HEALING EX) Apply 1 application  topically daily as needed (bilateral lower leg xerosis cutis).   losartan (COZAAR) 50 MG tablet Take 1.5 tablets (75 mg total) by mouth daily.   methocarbamol (ROBAXIN) 500 MG tablet Take 500 mg by mouth every 6 (six) hours as needed for muscle spasms.   metoprolol tartrate (LOPRESSOR) 25 MG tablet Take 12.5 mg by mouth 2 (two) times daily. Hold for HR <60   polyethylene glycol powder (GLYCOLAX/MIRALAX) 17 GM/SCOOP powder Take 17 g by mouth every other day.   senna (SENOKOT) 8.6 MG TABS tablet Take 2 tablets by mouth at bedtime as needed for mild constipation.   Facility-Administered Encounter Medications as of 06/18/2023  Medication   [START ON 10/01/2023] denosumab (PROLIA) injection 60 mg    Review of Systems  Constitutional:  Negative for activity change and appetite change.  HENT: Negative.    Respiratory:  Negative for cough and shortness of breath.   Cardiovascular:  Negative for leg swelling.  Gastrointestinal:  Negative for  constipation.  Genitourinary: Negative.   Musculoskeletal:  Positive for gait problem. Negative for arthralgias and myalgias.  Skin:  Positive for wound.  Neurological:  Negative for dizziness and weakness.  Psychiatric/Behavioral:  Positive for confusion. Negative for dysphoric mood and sleep disturbance.     Immunization History  Administered Date(s) Administered   Fluad Quad(high Dose 65+) 04/05/2020   Fluad Trivalent(High Dose 65+) 03/20/2023   Influenza Split 03/08/2011, 03/05/2012   Influenza Whole 03/08/2007, 03/02/2009, 03/29/2010   Influenza, High Dose Seasonal PF 03/21/2013, 06/13/2017, 03/07/2019, 03/26/2020   Influenza,inj,Quad PF,6+ Mos 02/20/2014, 02/11/2015, 03/22/2018   Influenza-Unspecified 03/23/2016, 04/05/2020, 03/03/2022   Moderna Covid-19 Fall Seasonal Vaccine 71yrs & older 09/21/2022   Moderna Covid-19 Vaccine Bivalent Booster 45yrs &  up 03/10/2021, 03/20/2023   Moderna SARS-COV2 Booster Vaccination 09/20/2020   Moderna Sars-Covid-2 Vaccination 06/10/2019, 07/09/2019, 10/13/2021, 04/03/2022   PFIZER(Purple Top)SARS-COV-2 Vaccination 04/05/2020   Pneumococcal Conjugate-13 07/30/2014   Pneumococcal Polysaccharide-23 02/03/2002, 12/16/2009   Td 02/03/2002   Tdap 12/11/2012, 12/23/2022   Zoster Recombinant(Shingrix) 05/10/2022, 07/11/2022   Pertinent  Health Maintenance Due  Topic Date Due   INFLUENZA VACCINE  Completed   DEXA SCAN  Completed      08/08/2022    1:26 PM 11/20/2022    1:03 PM 12/22/2022    9:56 AM 01/09/2023    2:29 PM 04/23/2023    9:44 AM  Fall Risk  Falls in the past year? 1 1 1 1  0  Was there an injury with Fall? 1 1 1  0 0  Fall Risk Category Calculator 2 3 3 1  0  Patient at Risk for Falls Due to History of fall(s) History of fall(s);Impaired balance/gait;Impaired mobility;Impaired vision History of fall(s);Impaired balance/gait History of fall(s);Impaired balance/gait No Fall Risks  Fall risk Follow up Falls evaluation completed Falls  evaluation completed Falls evaluation completed Falls evaluation completed;Education provided Falls evaluation completed   Functional Status Survey:    Vitals:   06/18/23 1548  BP: (!) 164/87  Pulse: 78  Resp: 16  Temp: (!) 97.3 F (36.3 C)  SpO2: 92%  Weight: 123 lb 12.8 oz (56.2 kg)  Height: 4\' 11"  (1.499 m)   Body mass index is 25 kg/m. Physical Exam Constitutional:      Appearance: Normal appearance.  HENT:     Head: Normocephalic.     Nose: Nose normal.     Mouth/Throat:     Mouth: Mucous membranes are moist.  Eyes:     Pupils: Pupils are equal, round, and reactive to light.  Cardiovascular:     Rate and Rhythm: Normal rate.  Pulmonary:     Effort: Pulmonary effort is normal.     Breath sounds: Normal breath sounds.  Abdominal:     General: Abdomen is flat.     Palpations: Abdomen is soft.  Musculoskeletal:        General: No swelling.     Cervical back: Neck supple.  Skin:    Comments: Has small red area in Right Upper Arm It is red not tender did have some odor and discharge but no swelling  Neurological:     General: No focal deficit present.     Mental Status: She is alert.     Labs reviewed: Recent Labs    08/08/22 0000 02/02/23 0000 04/12/23 0000  NA 138 140 137  K 4.0 4.7 4.5  CL 101 104 103  CO2 26* 22 24*  BUN 21 25* 25*  CREATININE 0.6 0.7 0.6  CALCIUM 9.4 8.7 9.1   No results for input(s): "AST", "ALT", "ALKPHOS", "BILITOT", "PROT", "ALBUMIN" in the last 8760 hours. Recent Labs    08/08/22 0000 04/12/23 0000  WBC 4.9 5.0  HGB 11.5* 11.4*  HCT 35* 35*  PLT 157 151   Lab Results  Component Value Date   TSH 1.39 01/02/2022   No results found for: "HGBA1C" Lab Results  Component Value Date   CHOL 238 (A) 08/20/2018   HDL 67 08/20/2018   LDLCALC 154 08/20/2018   LDLDIRECT 118.7 07/20/2011   TRIG 85 08/20/2018   CHOLHDL 4 12/30/2013    Significant Diagnostic Results in last 30 days:  No results  found.  Assessment/Plan 1. Compulsive skin picking (Primary) Will discontinue her Cymbalta and  try Zoloft 25 mg to see if it helps her Skin Picking habit  2. Open wound of skin Mupirocin ointment for now Hold Po Antibiotics  3. Mild cognitive impairment with memory loss SNF level of care    Family/ staff Communication:  Labs/tests ordered:

## 2023-07-12 ENCOUNTER — Non-Acute Institutional Stay (SKILLED_NURSING_FACILITY): Payer: Self-pay | Admitting: Adult Health

## 2023-07-12 ENCOUNTER — Encounter: Payer: Self-pay | Admitting: Adult Health

## 2023-07-12 DIAGNOSIS — K5909 Other constipation: Secondary | ICD-10-CM | POA: Diagnosis not present

## 2023-07-12 DIAGNOSIS — M810 Age-related osteoporosis without current pathological fracture: Secondary | ICD-10-CM | POA: Diagnosis not present

## 2023-07-12 DIAGNOSIS — M150 Primary generalized (osteo)arthritis: Secondary | ICD-10-CM

## 2023-07-12 DIAGNOSIS — I480 Paroxysmal atrial fibrillation: Secondary | ICD-10-CM | POA: Diagnosis not present

## 2023-07-12 DIAGNOSIS — I10 Essential (primary) hypertension: Secondary | ICD-10-CM

## 2023-07-12 DIAGNOSIS — F424 Excoriation (skin-picking) disorder: Secondary | ICD-10-CM

## 2023-07-12 DIAGNOSIS — F0390 Unspecified dementia without behavioral disturbance: Secondary | ICD-10-CM | POA: Diagnosis not present

## 2023-07-12 DIAGNOSIS — F325 Major depressive disorder, single episode, in full remission: Secondary | ICD-10-CM | POA: Diagnosis not present

## 2023-07-12 NOTE — Progress Notes (Signed)
Location:  Medical illustrator of Service:  SNF (31) Provider:   Peggye Ley, ANP [facility] [phone]   Mahlon Gammon, MD  Patient Care Team: Mahlon Gammon, MD as PCP - General (Internal Medicine) Bernette Redbird, MD as Consulting Physician (Gastroenterology) Dannielle Huh, MD as Consulting Physician (Orthopedic Surgery) Ellen Henri as Consulting Physician (Dermatology) Cassell Clement, MD as Consulting Physician (Cardiology) Maris Berger, MD as Consulting Physician (Ophthalmology) Glennis Brink, MD as Referring Physician (Dermatology)  Extended Emergency Contact Information Primary Emergency Contact: Berton Mount of [place] Home Phone: [phone] Relation: Daughter Secondary Emergency Contact: [facility] Address: [phone] [address], [place] [address] Darden Amber of [place] Home Phone: [phone] Mobile Phone: [phone] Relation: Son  Code Status:  DNR Goals of care: Advanced Directive information    06/18/2023    3:51 PM  Advanced Directives  Does Patient Have a Medical Advance Directive? Yes  Type of Estate agent of [place];Out of facility DNR (pink MOST or yellow form);Living will  Does patient want to make changes to medical advance directive? No - Patient declined  Copy of Healthcare Power of Attorney in Chart? Yes - validated most recent copy scanned in chart (See row information)  Pre-existing out of facility DNR order (yellow form or pink MOST form) Yellow form placed in chart (order not valid for inpatient use);Pink MOST form placed in chart (order not valid for inpatient use)     Chief Complaint  Patient presents with   Medical Management of Chronic Issues    HPI:  Pt is a 89 y.o. female seen today for medical management of chronic diseases.    She resides in skilled care due to falls and progressive memory loss There are no acute conerns for our  visit. She is resting comfortably in her recliner.  She was started on Zoloft and taken off cymbalta on 06/18/23 due to compulsive picking of her faces and arms leading to wounds. The staff continues to apply a dressing to the left chin and right jaw.   Prior hx of OA with knee pain and chronic low back pain. No acute issues. Pain is well controlled with tylenol  She was found to be in afib/flutter in Nov of 2024. Due to her goals of care, fall risk and age she was not placed on DOAC. She is on metoprolol. She is not having palpitations or cp. Rate is controlled. TSH 1.74 04/13/23, Mg 2.0  HTN:  BPs reviewed  Blood Pressure: 164 / 76 mmHg   Blood Pressure: 164 / 76 mmHg  Blood Pressure: 119 / 67 mmHg  Blood Pressure: 154 / 80 mmHg  Blood Pressure: 168 / 63 mmHg  OP: T score -4.5 04/15/19, worse from 2018 -4.3 On prolia    Constipation regular BMs noted.  Past Medical History:  Diagnosis Date   Arthritis    "all over"   BACK PAIN 03/08/2007   Cancer (HCC)    squamous cell on R leg & face- ?basal cell   Cutaneous abscess of right lower limb    DEPRESSIVE DISORDER 12/01/2008   Disruption of external operation (surgical) wound, not elsewhere classified, initial encounter    Duodenal ulcer    Essential (primary) hypertension    GI bleed 09/12/2010   Naproxen induced Endoscopy with dr Marjorie Smolder    HIATAL HERNIA 12/17/2006   History of hiatal hernia    HYPERLIPIDEMIA 03/09/2006  Hyperlipidemia, unspecified    HYPERTENSION 03/09/2006   KNEE PAIN 05/06/2009   Macular degeneration    Major depressive disorder, single episode, unspecified    Pneumonia    15yrs. ago- hosp. pneumonia   Urinary urgency    Past Surgical History:  Procedure Laterality Date   ABDOMINAL HYSTERECTOMY  1975   APPLICATION OF A-CELL OF EXTREMITY Right 12/10/2014   Procedure: APPLICATION OF A-CELL OF EXTREMITY;  Surgeon: Wayland Denis, DO;  Location: [facility] OR;  Service: Plastics;  Laterality: Right;    arthroscopic knee Right    x 2   BACK SURGERY  2000   spinal fusion   BILATERAL SALPINGECTOMY Bilateral 1989   Dr. Gavin Potters   CARDIAC CATHETERIZATION     EYE SURGERY     cataracts bilateral - removed   HERNIA REPAIR Bilateral    inguinal    I & D EXTREMITY Right 12/10/2014   Procedure: IRRIGATION AND DEBRIDEMENT ON RIGHT LEG, ;  Surgeon: Wayland Denis, DO;  Location: [facility] OR;  Service: Plastics;  Laterality: Right;   I & D EXTREMITY Right 01/20/2015   Procedure: IRRIGATION AND DEBRIDEMENT RIGHT LOWER LEG WOUND, with ACELL to Donor Site, SKIN GRAFT AND VAC Placement;  Surgeon: Wayland Denis, DO;  Location: [facility] OR;  Service: Plastics;  Laterality: Right;   LUMBAR LAMINECTOMY  02-17-1999   with sinal fusion L3,4,5,&S1   moes     moses surgery on R lle   OOPHORECTOMY  1989   RESECTION DISTAL CLAVICAL     ROTATOR CUFF REPAIR Right 2001   x2   SKIN GRAFT Right 05/29/2018   THYROIDECTOMY     TONSILLECTOMY  1928   TUBAL LIGATION      Allergies  Allergen Reactions   Naproxen     Gi bleed   Nsaids     GI BLEED   Aspirin     GI bleed   Ace Inhibitors     Cough    Caffeine Other (See Comments)    Causes joints to be painful    Outpatient Encounter Medications as of 07/12/2023  Medication Sig   acetaminophen (TYLENOL) 500 MG tablet Take 1,000 mg by mouth 3 (three) times daily.   Carboxymethylcellulose Sodium (THERATEARS) 0.25 % SOLN Apply 2-3 drops to eye daily as needed.   Emollient (CERAVE HEALING EX) Apply 1 application  topically daily as needed (bilateral lower leg xerosis cutis).   losartan (COZAAR) 50 MG tablet Take 1.5 tablets (75 mg total) by mouth daily.   methocarbamol (ROBAXIN) 500 MG tablet Take 500 mg by mouth every 6 (six) hours as needed for muscle spasms.   metoprolol tartrate (LOPRESSOR) 25 MG tablet Take 12.5 mg by mouth 2 (two) times daily. Hold for HR <60   polyethylene glycol powder (GLYCOLAX/MIRALAX) 17 GM/SCOOP powder Take 17 g by mouth every other day.   senna  (SENOKOT) 8.6 MG TABS tablet Take 2 tablets by mouth at bedtime as needed for mild constipation.   sertraline (ZOLOFT) 25 MG tablet Take 25 mg by mouth daily.   Facility-Administered Encounter Medications as of 07/12/2023  Medication   [START ON 10/01/2023] denosumab (PROLIA) injection 60 mg    Review of Systems  Constitutional:  Negative for activity change, appetite change, chills, diaphoresis, fatigue, fever and unexpected weight change.  HENT:  Negative for congestion.   Respiratory:  Negative for cough, shortness of breath and wheezing.   Cardiovascular:  Negative for chest pain, palpitations and leg swelling.  Gastrointestinal:  Negative for abdominal  distention, abdominal pain, constipation and diarrhea.  Genitourinary:  Negative for difficulty urinating and dysuria.  Musculoskeletal:  Positive for arthralgias and gait problem. Negative for back pain, joint swelling and myalgias.  Neurological:  Negative for dizziness, tremors, seizures, syncope, facial asymmetry, speech difficulty, weakness, light-headedness, numbness and headaches.  Psychiatric/Behavioral:  Positive for confusion. Negative for agitation and behavioral problems.     Immunization History  Administered Date(s) Administered   Fluad Quad(high Dose 65+) 04/05/2020   Fluad Trivalent(High Dose 65+) 03/20/2023   Influenza Split 03/08/2011, 03/05/2012   Influenza Whole 03/08/2007, 03/02/2009, 03/29/2010   Influenza, High Dose Seasonal PF 03/21/2013, 06/13/2017, 03/07/2019, 03/26/2020   Influenza,inj,Quad PF,6+ Mos 02/20/2014, 02/11/2015, 03/22/2018   Influenza-Unspecified 03/23/2016, 04/05/2020, 03/03/2022   Moderna Covid-19 Fall Seasonal Vaccine 42yrs & older 09/21/2022   Moderna Covid-19 Vaccine Bivalent Booster 77yrs & up 03/10/2021, 03/20/2023   Moderna SARS-COV2 Booster Vaccination 09/20/2020   Moderna Sars-Covid-2 Vaccination 06/10/2019, 07/09/2019, 10/13/2021, 04/03/2022   PFIZER(Purple Top)SARS-COV-2 Vaccination  04/05/2020   Pneumococcal Conjugate-13 07/30/2014   Pneumococcal Polysaccharide-23 02/03/2002, 12/16/2009   Td 02/03/2002   Tdap 12/11/2012, 12/23/2022   Zoster Recombinant(Shingrix) 05/10/2022, 07/11/2022   Pertinent  Health Maintenance Due  Topic Date Due   INFLUENZA VACCINE  Completed   DEXA SCAN  Completed      08/08/2022    1:26 PM 11/20/2022    1:03 PM 12/22/2022    9:56 AM 01/09/2023    2:29 PM 04/23/2023    9:44 AM  Fall Risk  Falls in the past year? 1 1 1 1  0  Was there an injury with Fall? 1 1 1  0 0  Fall Risk Category Calculator 2 3 3 1  0  Patient at Risk for Falls Due to History of fall(s) History of fall(s);Impaired balance/gait;Impaired mobility;Impaired vision History of fall(s);Impaired balance/gait History of fall(s);Impaired balance/gait No Fall Risks  Fall risk Follow up Falls evaluation completed Falls evaluation completed Falls evaluation completed Falls evaluation completed;Education provided Falls evaluation completed   Functional Status Survey:    Vitals:   07/12/23 1640  BP: (!) 164/76  Pulse: 78  Resp: 18  Temp: (!) 97 F (36.1 C)  SpO2: 94%  Weight: 125 lb (56.7 kg)   Body mass index is 25.25 kg/m. Physical Exam Vitals and nursing note reviewed.  Constitutional:      General: She is not in acute distress.    Appearance: She is not diaphoretic.  HENT:     Head: Normocephalic and atraumatic.  Neck:     Vascular: No JVD.  Cardiovascular:     Rate and Rhythm: Normal rate and regular rhythm.     Heart sounds: No murmur heard. Pulmonary:     Effort: Pulmonary effort is normal. No respiratory distress.     Breath sounds: Normal breath sounds. No wheezing.  Abdominal:     General: Bowel sounds are normal. There is no distension.     Palpations: Abdomen is soft.     Tenderness: There is no abdominal tenderness.  Musculoskeletal:     Right lower leg: No edema.     Left lower leg: Edema (trace to ankle) present.  Skin:    General: Skin is  warm and dry.  Neurological:     General: No focal deficit present.     Mental Status: She is alert. Mental status is at baseline.  Psychiatric:        Mood and Affect: Mood normal.     Labs reviewed: Recent Labs    08/08/22  0000 02/02/23 0000 04/12/23 0000  NA 138 140 137  K 4.0 4.7 4.5  CL 101 104 103  CO2 26* 22 24*  BUN 21 25* 25*  CREATININE 0.6 0.7 0.6  CALCIUM 9.4 8.7 9.1   No results for input(s): "AST", "ALT", "ALKPHOS", "BILITOT", "PROT", "ALBUMIN" in the last 8760 hours. Recent Labs    08/08/22 0000 04/12/23 0000  WBC 4.9 5.0  HGB 11.5* 11.4*  HCT 35* 35*  PLT 157 151   Lab Results  Component Value Date   TSH 1.39 01/02/2022   No results found for: "HGBA1C" Lab Results  Component Value Date   CHOL 238 (A) 08/20/2018   HDL 67 08/20/2018   LDLCALC 154 08/20/2018   LDLDIRECT 118.7 07/20/2011   TRIG 85 08/20/2018   CHOLHDL 4 12/30/2013    Significant Diagnostic Results in last 30 days:  No results found.  Assessment/Plan 1. Compulsive skin picking (Primary) Started on zoloft Needs more time to work No change in behavior at this time   2. Paroxysmal atrial fibrillation (HCC) Not on DOAC due to age, risk, etc.  Rate is controlled on metoprolol and regular on exam today   3. Essential hypertension Elevated at times but she also has falls and is 103  so would hold off on aggressive management and continue prn hydralazine per facility protocol  4. Dementia without behavioral disturbance (HCC) Progressive decline in cognition and physical function c/w the disease. Continue supportive care in the skilled environment.  5. Chronic constipation Continue miralax and prn senokot   6. Senile osteoporosis On prolia   7. Major depressive disorder with single episode, in full remission (HCC) Off cymbalta and now on zoloft for picking Stable mood   8. Primary osteoarthritis involving multiple joints Continue tylenol scheduled     Family/ staff  Communication: resident   Labs/tests ordered:  NA

## 2023-07-26 DIAGNOSIS — F03B0 Unspecified dementia, moderate, without behavioral disturbance, psychotic disturbance, mood disturbance, and anxiety: Secondary | ICD-10-CM | POA: Diagnosis not present

## 2023-07-26 DIAGNOSIS — R2689 Other abnormalities of gait and mobility: Secondary | ICD-10-CM | POA: Diagnosis not present

## 2023-07-27 DIAGNOSIS — R2689 Other abnormalities of gait and mobility: Secondary | ICD-10-CM | POA: Diagnosis not present

## 2023-07-27 DIAGNOSIS — F03B0 Unspecified dementia, moderate, without behavioral disturbance, psychotic disturbance, mood disturbance, and anxiety: Secondary | ICD-10-CM | POA: Diagnosis not present

## 2023-07-30 DIAGNOSIS — F03B0 Unspecified dementia, moderate, without behavioral disturbance, psychotic disturbance, mood disturbance, and anxiety: Secondary | ICD-10-CM | POA: Diagnosis not present

## 2023-07-30 DIAGNOSIS — R2689 Other abnormalities of gait and mobility: Secondary | ICD-10-CM | POA: Diagnosis not present

## 2023-07-31 DIAGNOSIS — R2689 Other abnormalities of gait and mobility: Secondary | ICD-10-CM | POA: Diagnosis not present

## 2023-07-31 DIAGNOSIS — F03B0 Unspecified dementia, moderate, without behavioral disturbance, psychotic disturbance, mood disturbance, and anxiety: Secondary | ICD-10-CM | POA: Diagnosis not present

## 2023-08-01 DIAGNOSIS — R2689 Other abnormalities of gait and mobility: Secondary | ICD-10-CM | POA: Diagnosis not present

## 2023-08-01 DIAGNOSIS — F03B0 Unspecified dementia, moderate, without behavioral disturbance, psychotic disturbance, mood disturbance, and anxiety: Secondary | ICD-10-CM | POA: Diagnosis not present

## 2023-08-03 DIAGNOSIS — F03B0 Unspecified dementia, moderate, without behavioral disturbance, psychotic disturbance, mood disturbance, and anxiety: Secondary | ICD-10-CM | POA: Diagnosis not present

## 2023-08-03 DIAGNOSIS — R2689 Other abnormalities of gait and mobility: Secondary | ICD-10-CM | POA: Diagnosis not present

## 2023-08-09 ENCOUNTER — Non-Acute Institutional Stay (SKILLED_NURSING_FACILITY): Payer: Self-pay | Admitting: Adult Health

## 2023-08-09 DIAGNOSIS — F424 Excoriation (skin-picking) disorder: Secondary | ICD-10-CM | POA: Diagnosis not present

## 2023-08-09 DIAGNOSIS — M810 Age-related osteoporosis without current pathological fracture: Secondary | ICD-10-CM

## 2023-08-09 DIAGNOSIS — K5909 Other constipation: Secondary | ICD-10-CM | POA: Diagnosis not present

## 2023-08-09 DIAGNOSIS — F0390 Unspecified dementia without behavioral disturbance: Secondary | ICD-10-CM

## 2023-08-09 DIAGNOSIS — I10 Essential (primary) hypertension: Secondary | ICD-10-CM

## 2023-08-10 ENCOUNTER — Encounter: Payer: Self-pay | Admitting: Adult Health

## 2023-08-10 NOTE — Progress Notes (Signed)
 Location:  Medical illustrator of Service:  SNF (31) Provider:   Peggye Ley, ANP [facility] [phone]   Mahlon Gammon, MD  Patient Care Team: Mahlon Gammon, MD as PCP - General (Internal Medicine) Bernette Redbird, MD as Consulting Physician (Gastroenterology) Dannielle Huh, MD as Consulting Physician (Orthopedic Surgery) Ellen Henri as Consulting Physician (Dermatology) Cassell Clement, MD as Consulting Physician (Cardiology) Maris Berger, MD as Consulting Physician (Ophthalmology) Glennis Brink, MD as Referring Physician (Dermatology)  Extended Emergency Contact Information Primary Emergency Contact: Berton Mount of [place] Home Phone: [phone] Relation: Daughter Secondary Emergency Contact: [facility] Address: [phone] [address], [place] [address] Darden Amber of [place] Home Phone: [phone] Mobile Phone: [phone] Relation: Son  Code Status:  DNR Goals of care: Advanced Directive information    06/18/2023    3:51 PM  Advanced Directives  Does Patient Have a Medical Advance Directive? Yes  Type of Estate agent of [place];Out of facility DNR (pink MOST or yellow form);Living will  Does patient want to make changes to medical advance directive? No - Patient declined  Copy of Healthcare Power of Attorney in Chart? Yes - validated most recent copy scanned in chart (See row information)  Pre-existing out of facility DNR order (yellow form or pink MOST form) Yellow form placed in chart (order not valid for inpatient use);Pink MOST form placed in chart (order not valid for inpatient use)     Chief Complaint  Patient presents with   Medical Management of Chronic Issues    HPI:  Pt is a 89 y.o. female seen today for medical management of chronic diseases.    She resides in skilled care due to falls and progressive memory loss .  She was started on Zoloft and  taken off cymbalta on 06/18/23 due to compulsive picking of her faces and arms leading to wounds. The staff continues to apply a dressing to the left chin and right jaw. The staff report improvement in this symptom.  Prior hx of OA with knee pain and chronic low back pain. No acute issues. Pain is well controlled with tylenol  She was found to be in afib/flutter in Nov of 2024. Due to her goals of care, fall risk and age she was not placed on DOAC. She is on metoprolol. She is not having palpitations or cp. Rate is controlled. TSH 1.74 04/13/23, Mg 2.0  HTN:  Bp is elevated at times. Average 140-160/60-80 Staff uses prn hydralazine if its over 170  OP: T score -4.5 04/15/19, worse from 2018 -4.3 On prolia   Constipation regular BMs noted.   Umbilical hernia: no current issues. Also has hiatal hernia.  Past Medical History:  Diagnosis Date   Arthritis    "all over"   BACK PAIN 03/08/2007   Cancer (HCC)    squamous cell on R leg & face- ?basal cell   Cutaneous abscess of right lower limb    DEPRESSIVE DISORDER 12/01/2008   Disruption of external operation (surgical) wound, not elsewhere classified, initial encounter    Duodenal ulcer    Essential (primary) hypertension    GI bleed 09/12/2010   Naproxen induced Endoscopy with dr Marjorie Smolder    HIATAL HERNIA 12/17/2006   History of hiatal hernia    HYPERLIPIDEMIA 03/09/2006   Hyperlipidemia, unspecified    HYPERTENSION 03/09/2006   KNEE PAIN 05/06/2009   Macular degeneration    Major  depressive disorder, single episode, unspecified    Pneumonia    32yrs. ago- hosp. pneumonia   Urinary urgency    Past Surgical History:  Procedure Laterality Date   ABDOMINAL HYSTERECTOMY  1975   APPLICATION OF A-CELL OF EXTREMITY Right 12/10/2014   Procedure: APPLICATION OF A-CELL OF EXTREMITY;  Surgeon: Wayland Denis, DO;  Location: [facility] OR;  Service: Plastics;  Laterality: Right;   arthroscopic knee Right    x 2   BACK SURGERY  2000   spinal  fusion   BILATERAL SALPINGECTOMY Bilateral 1989   Dr. Gavin Potters   CARDIAC CATHETERIZATION     EYE SURGERY     cataracts bilateral - removed   HERNIA REPAIR Bilateral    inguinal    I & D EXTREMITY Right 12/10/2014   Procedure: IRRIGATION AND DEBRIDEMENT ON RIGHT LEG, ;  Surgeon: Wayland Denis, DO;  Location: [facility] OR;  Service: Plastics;  Laterality: Right;   I & D EXTREMITY Right 01/20/2015   Procedure: IRRIGATION AND DEBRIDEMENT RIGHT LOWER LEG WOUND, with ACELL to Donor Site, SKIN GRAFT AND VAC Placement;  Surgeon: Wayland Denis, DO;  Location: [facility] OR;  Service: Plastics;  Laterality: Right;   LUMBAR LAMINECTOMY  02-17-1999   with sinal fusion L3,4,5,&S1   moes     moses surgery on R lle   OOPHORECTOMY  1989   RESECTION DISTAL CLAVICAL     ROTATOR CUFF REPAIR Right 2001   x2   SKIN GRAFT Right 05/29/2018   THYROIDECTOMY     TONSILLECTOMY  1928   TUBAL LIGATION      Allergies  Allergen Reactions   Naproxen     Gi bleed   Nsaids     GI BLEED   Aspirin     GI bleed   Ace Inhibitors     Cough    Caffeine Other (See Comments)    Causes joints to be painful    Outpatient Encounter Medications as of 08/09/2023  Medication Sig   acetaminophen (TYLENOL) 500 MG tablet Take 1,000 mg by mouth 3 (three) times daily.   Carboxymethylcellulose Sodium (THERATEARS) 0.25 % SOLN Apply 2-3 drops to eye daily as needed.   Emollient (CERAVE HEALING EX) Apply 1 application  topically daily as needed (bilateral lower leg xerosis cutis).   losartan (COZAAR) 50 MG tablet Take 1.5 tablets (75 mg total) by mouth daily.   methocarbamol (ROBAXIN) 500 MG tablet Take 500 mg by mouth every 6 (six) hours as needed for muscle spasms.   metoprolol tartrate (LOPRESSOR) 25 MG tablet Take 12.5 mg by mouth 2 (two) times daily. Hold for HR <60   polyethylene glycol powder (GLYCOLAX/MIRALAX) 17 GM/SCOOP powder Take 17 g by mouth every other day.   senna (SENOKOT) 8.6 MG TABS tablet Take 2 tablets by mouth at bedtime  as needed for mild constipation.   sertraline (ZOLOFT) 25 MG tablet Take 25 mg by mouth daily.   Facility-Administered Encounter Medications as of 08/09/2023  Medication   [START ON 10/01/2023] denosumab (PROLIA) injection 60 mg    Review of Systems  Constitutional:  Negative for activity change, appetite change, chills, diaphoresis, fatigue, fever and unexpected weight change.  HENT:  Negative for congestion.   Respiratory:  Negative for cough, shortness of breath and wheezing.   Cardiovascular:  Negative for chest pain, palpitations and leg swelling.  Gastrointestinal:  Negative for abdominal distention, abdominal pain, constipation and diarrhea.  Genitourinary:  Negative for difficulty urinating and dysuria.  Musculoskeletal:  Positive for  arthralgias and gait problem. Negative for back pain, joint swelling and myalgias.  Neurological:  Negative for dizziness, tremors, seizures, syncope, facial asymmetry, speech difficulty, weakness, light-headedness, numbness and headaches.  Psychiatric/Behavioral:  Positive for confusion. Negative for agitation and behavioral problems.     Immunization History  Administered Date(s) Administered   Fluad Quad(high Dose 65+) 04/05/2020   Fluad Trivalent(High Dose 65+) 03/20/2023   Influenza Split 03/08/2011, 03/05/2012   Influenza Whole 03/08/2007, 03/02/2009, 03/29/2010   Influenza, High Dose Seasonal PF 03/21/2013, 06/13/2017, 03/07/2019, 03/26/2020   Influenza,inj,Quad PF,6+ Mos 02/20/2014, 02/11/2015, 03/22/2018   Influenza-Unspecified 03/23/2016, 04/05/2020, 03/03/2022   Moderna Covid-19 Fall Seasonal Vaccine 82yrs & older 09/21/2022   Moderna Covid-19 Vaccine Bivalent Booster 40yrs & up 03/10/2021, 03/20/2023   Moderna SARS-COV2 Booster Vaccination 09/20/2020   Moderna Sars-Covid-2 Vaccination 06/10/2019, 07/09/2019, 10/13/2021, 04/03/2022   PFIZER(Purple Top)SARS-COV-2 Vaccination 04/05/2020   Pneumococcal Conjugate-13 07/30/2014    Pneumococcal Polysaccharide-23 02/03/2002, 12/16/2009   Td 02/03/2002   Tdap 12/11/2012, 12/23/2022   Zoster Recombinant(Shingrix) 05/10/2022, 07/11/2022   Pertinent  Health Maintenance Due  Topic Date Due   INFLUENZA VACCINE  Completed   DEXA SCAN  Completed      08/08/2022    1:26 PM 11/20/2022    1:03 PM 12/22/2022    9:56 AM 01/09/2023    2:29 PM 04/23/2023    9:44 AM  Fall Risk  Falls in the past year? 1 1 1 1  0  Was there an injury with Fall? 1 1 1  0 0  Fall Risk Category Calculator 2 3 3 1  0  Patient at Risk for Falls Due to History of fall(s) History of fall(s);Impaired balance/gait;Impaired mobility;Impaired vision History of fall(s);Impaired balance/gait History of fall(s);Impaired balance/gait No Fall Risks  Fall risk Follow up Falls evaluation completed Falls evaluation completed Falls evaluation completed Falls evaluation completed;Education provided Falls evaluation completed   Functional Status Survey:    Vitals:   08/10/23 1140  BP: (!) 160/86  Pulse: 82  Temp: 98.6 F (37 C)  Weight: 124 lb 9.6 oz (56.5 kg)   Body mass index is 25.17 kg/m. Physical Exam Vitals and nursing note reviewed.  Constitutional:      Appearance: Normal appearance.  HENT:     Mouth/Throat:     Pharynx: Oropharynx is clear.  Abdominal:     General: There is no distension.     Hernia: A hernia is present.  Neurological:     General: No focal deficit present.     Mental Status: She is alert. Mental status is at baseline.  Psychiatric:        Mood and Affect: Mood normal.     Labs reviewed: Recent Labs    02/02/23 0000 04/12/23 0000  NA 140 137  K 4.7 4.5  CL 104 103  CO2 22 24*  BUN 25* 25*  CREATININE 0.7 0.6  CALCIUM 8.7 9.1   No results for input(s): "AST", "ALT", "ALKPHOS", "BILITOT", "PROT", "ALBUMIN" in the last 8760 hours. Recent Labs    04/12/23 0000  WBC 5.0  HGB 11.4*  HCT 35*  PLT 151   Lab Results  Component Value Date   TSH 1.39 01/02/2022    No results found for: "HGBA1C" Lab Results  Component Value Date   CHOL 238 (A) 08/20/2018   HDL 67 08/20/2018   LDLCALC 154 08/20/2018   LDLDIRECT 118.7 07/20/2011   TRIG 85 08/20/2018   CHOLHDL 4 12/30/2013    Significant Diagnostic Results in last 30 days:  No results found.  Assessment/Plan 1. Compulsive skin picking (Primary) Improved.  Continue zoloft   2. Paroxysmal atrial fibrillation (HCC) Not on DOAC due to age, risk, etc.  Rate is controlled on metoprolol  3. Essential hypertension Elevated at times but she also has falls and is 103  so would hold off on aggressive management and continue prn hydralazine per facility protocol  4. Dementia without behavioral disturbance (HCC) Progressive decline in cognition and physical function c/w the disease. Continue supportive care in the skilled environment.  5. Chronic constipation Continue miralax and prn senokot   6. Senile osteoporosis On prolia   7. Major depressive disorder with single episode, in full remission (HCC) Off cymbalta and now on zoloft for picking Stable mood   8. Primary osteoarthritis involving multiple joints Continue tylenol scheduled     Family/ staff Communication: resident   Labs/tests ordered:  NA

## 2023-08-22 DIAGNOSIS — H6123 Impacted cerumen, bilateral: Secondary | ICD-10-CM | POA: Diagnosis not present

## 2023-08-23 ENCOUNTER — Telehealth: Payer: Self-pay

## 2023-08-23 NOTE — Telephone Encounter (Signed)
 Copied from CRM [id number]. Topic: General - Other >> Aug 23, 2023  3:38 PM Shamecia H wrote: Reason for CRM: Patients son was wanting to know how his mom (patient) can get a form for the dmv stating that she is home bound and can't come out, she needs a state id, patients sons Jernee Murtaugh) callback number is [phone]   Spoke with Houston Siren states he needs a letter that explains his mothers home bound condition to submit to the [facility]. Merlyn Albert would like letter mailed to him.  [facility] [address] [place], [place] [address]

## 2023-08-27 NOTE — Telephone Encounter (Signed)
 I have talked to Vanessa Mason in [place] and she will type a letter for me which I can Sign. She will call Merlyn Albert and would let him know.If someone from our office can call him to inform the plan.

## 2023-09-11 ENCOUNTER — Telehealth: Payer: Self-pay | Admitting: *Deleted

## 2023-09-11 NOTE — Telephone Encounter (Signed)
 Patient has an appointment on 10/05/2023 for Prolia Injection.  Needs Labwork done prior.   Please schedule patient a lab appointment to have done prior.

## 2023-09-11 NOTE — Telephone Encounter (Signed)
 Received [employer] Verification for Prolia Copay 20% Admin Fee 20%= $346 [place] APPROVED 03/28/2023-03/27/2024 Auth#: [id number]

## 2023-09-12 NOTE — Telephone Encounter (Signed)
 I have placed the order

## 2023-09-18 DIAGNOSIS — I10 Essential (primary) hypertension: Secondary | ICD-10-CM | POA: Diagnosis not present

## 2023-09-18 DIAGNOSIS — R000 Tachycardia, unspecified: Secondary | ICD-10-CM | POA: Diagnosis not present

## 2023-09-18 LAB — BASIC METABOLIC PANEL WITH GFR
BUN: 23 — AB (ref 4–21)
CO2: 25 — AB (ref 13–22)
Chloride: 105 (ref 99–108)
Creatinine: 0.4 — AB (ref 0.5–1.1)
Glucose: 81
Potassium: 4 meq/L (ref 3.5–5.1)
Sodium: 139 (ref 137–147)

## 2023-09-18 LAB — HEPATIC FUNCTION PANEL
ALT: 6 U/L — AB (ref 7–35)
AST: 13 (ref 13–35)
Alkaline Phosphatase: 51 (ref 25–125)
Bilirubin, Total: 0.3

## 2023-09-18 LAB — COMPREHENSIVE METABOLIC PANEL WITH GFR
Calcium: 8.4 — AB (ref 8.7–10.7)
eGFR: 84

## 2023-09-20 ENCOUNTER — Encounter: Payer: Self-pay | Admitting: Internal Medicine

## 2023-09-20 NOTE — Telephone Encounter (Signed)
 Pulled 09/18/2023 Labs from [facility] and Abstracted to patient's chart.  Calcium  is LOW.  Please Advise with patient getting the Prolia  Injection  10/05/2023

## 2023-09-24 ENCOUNTER — Encounter: Payer: Self-pay | Admitting: Internal Medicine

## 2023-09-24 ENCOUNTER — Non-Acute Institutional Stay (SKILLED_NURSING_FACILITY): Payer: Self-pay | Admitting: Internal Medicine

## 2023-09-24 DIAGNOSIS — I10 Essential (primary) hypertension: Secondary | ICD-10-CM

## 2023-09-24 DIAGNOSIS — M150 Primary generalized (osteo)arthritis: Secondary | ICD-10-CM | POA: Diagnosis not present

## 2023-09-24 DIAGNOSIS — I480 Paroxysmal atrial fibrillation: Secondary | ICD-10-CM

## 2023-09-24 DIAGNOSIS — M810 Age-related osteoporosis without current pathological fracture: Secondary | ICD-10-CM

## 2023-09-24 DIAGNOSIS — F424 Excoriation (skin-picking) disorder: Secondary | ICD-10-CM

## 2023-09-24 DIAGNOSIS — K5909 Other constipation: Secondary | ICD-10-CM

## 2023-09-24 DIAGNOSIS — F0390 Unspecified dementia without behavioral disturbance: Secondary | ICD-10-CM

## 2023-09-24 NOTE — Addendum Note (Signed)
 Addended by: Cathryne Cobble on: 09/24/2023 11:18 AM   Modules accepted: Orders

## 2023-09-24 NOTE — Telephone Encounter (Signed)
 Almira Armour I have d/w the family and they have decided to not do Prolia  anymore. So I am going to discontinue it.

## 2023-09-24 NOTE — Progress Notes (Signed)
 Location:  Medical illustrator of Service:  SNF ([id number])  Provider:   Code Status: DNR Goals of Care:     06/18/2023    3:51 PM  Advanced Directives  Does Patient Have a Medical Advance Directive? Yes  Type of Estate agent of [place];Out of facility DNR (pink MOST or yellow form);Living will  Does patient want to make changes to medical advance directive? No - Patient declined  Copy of Healthcare Power of Attorney in Chart? Yes - validated most recent copy scanned in chart (See row information)  Pre-existing out of facility DNR order (yellow form or pink MOST form) Yellow form placed in chart (order not valid for inpatient use);Pink MOST form placed in chart (order not valid for inpatient use)     Chief Complaint  Patient presents with   Care Management    HPI: Patient is a 89 y.o. female seen today for medical management of chronic diseases.   Lives in SNF in [place]   Patient has a history of osteoporosis, trochanteric bursitis, left knee osteoarthritis, depression, hypertension   H/o Osteoporosis She is on Prolia  per [facility]  office  Compulsive skin picking Patient continues to take, her skin on her chin.  She also has developed expressive aphasia.  Patient also has now become more weaker and is needing Alen Amy for her transfers , Blood pressure has been consistently up for last few months  Other wise she is stable ? Some concerns of Loose stools She is on Miralax  every other day Responds Appropriately No Behaviors Wt Readings from Last 3 Encounters:  09/24/23 124 lb 9.6 oz (56.5 kg)  08/10/23 124 lb 9.6 oz (56.5 kg)  07/12/23 125 lb (56.7 kg)        Past Medical History:  Diagnosis Date   Arthritis    "all over"   BACK PAIN 03/08/2007   Cancer (HCC)    squamous cell on R leg & face- ?basal cell   Cutaneous abscess of right lower limb    DEPRESSIVE DISORDER 12/01/2008   Disruption of external operation (surgical) wound,  not elsewhere classified, initial encounter    Duodenal ulcer    Essential (primary) hypertension    GI bleed 09/12/2010   Naproxen induced Endoscopy with dr Cassaundra Cleverly    HIATAL HERNIA 12/17/2006   History of hiatal hernia    HYPERLIPIDEMIA 03/09/2006   Hyperlipidemia, unspecified    HYPERTENSION 03/09/2006   KNEE PAIN 05/06/2009   Macular degeneration    Major depressive disorder, single episode, unspecified    Pneumonia    53yrs. ago- hosp. pneumonia   Urinary urgency     Past Surgical History:  Procedure Laterality Date   ABDOMINAL HYSTERECTOMY  1975   APPLICATION OF A-CELL OF EXTREMITY Right 12/10/2014   Procedure: APPLICATION OF A-CELL OF EXTREMITY;  Surgeon: Marilou Showman, DO;  Location: [facility] OR;  Service: Plastics;  Laterality: Right;   arthroscopic knee Right    x 2   BACK SURGERY  2000   spinal fusion   BILATERAL SALPINGECTOMY Bilateral 1989   Dr. Angel Kelch   CARDIAC CATHETERIZATION     EYE SURGERY     cataracts bilateral - removed   HERNIA REPAIR Bilateral    inguinal    I & D EXTREMITY Right 12/10/2014   Procedure: IRRIGATION AND DEBRIDEMENT ON RIGHT LEG, ;  Surgeon: Marilou Showman, DO;  Location: [facility] OR;  Service: Plastics;  Laterality: Right;   I & D EXTREMITY Right 01/20/2015  Procedure: IRRIGATION AND DEBRIDEMENT RIGHT LOWER LEG WOUND, with ACELL to Donor Site, SKIN GRAFT AND VAC Placement;  Surgeon: Marilou Showman, DO;  Location: [facility] OR;  Service: Plastics;  Laterality: Right;   LUMBAR LAMINECTOMY  02-17-1999   with sinal fusion L3,4,5,&S1   moes     moses surgery on R lle   OOPHORECTOMY  1989   RESECTION DISTAL CLAVICAL     ROTATOR CUFF REPAIR Right 2001   x2   SKIN GRAFT Right 05/29/2018   THYROIDECTOMY     TONSILLECTOMY  1928   TUBAL LIGATION      Allergies  Allergen Reactions   Naproxen     Gi bleed   Nsaids     GI BLEED   Aspirin     GI bleed   Ace Inhibitors     Cough    Caffeine Other (See Comments)    Causes joints to be painful    Outpatient  Encounter Medications as of 09/24/2023  Medication Sig   acetaminophen  (TYLENOL ) 500 MG tablet Take 1,000 mg by mouth 3 (three) times daily.   Carboxymethylcellulose Sodium (THERATEARS) 0.25 % SOLN Apply 2-3 drops to eye daily as needed.   Emollient (CERAVE HEALING EX) Apply 1 application  topically daily as needed (bilateral lower leg xerosis cutis).   losartan  (COZAAR ) 50 MG tablet Take 1.5 tablets (75 mg total) by mouth daily.   methocarbamol (ROBAXIN) 500 MG tablet Take 500 mg by mouth every 6 (six) hours as needed for muscle spasms.   metoprolol tartrate (LOPRESSOR) 25 MG tablet Take 12.5 mg by mouth 2 (two) times daily. Hold for HR <60   polyethylene glycol powder (GLYCOLAX /MIRALAX ) 17 GM/SCOOP powder Take 17 g by mouth every other day.   senna (SENOKOT) 8.6 MG TABS tablet Take 2 tablets by mouth at bedtime as needed for mild constipation.   sertraline (ZOLOFT) 25 MG tablet Take 25 mg by mouth daily.   No facility-administered encounter medications on file as of 09/24/2023.    Review of Systems:  Review of Systems  Constitutional:  Positive for activity change. Negative for appetite change.  HENT: Negative.    Respiratory:  Negative for cough and shortness of breath.   Cardiovascular:  Negative for leg swelling.  Gastrointestinal:  Positive for diarrhea. Negative for constipation.  Genitourinary: Negative.   Musculoskeletal:  Positive for gait problem. Negative for arthralgias and myalgias.  Skin:  Positive for wound.  Neurological:  Negative for dizziness and weakness.  Psychiatric/Behavioral:  Positive for confusion. Negative for dysphoric mood and sleep disturbance.     Health Maintenance  Topic Date Due   COVID-19 Vaccine (9 - 2024-25 season) 05/15/2023   Medicare Annual Wellness (AWV)  12/22/2023   INFLUENZA VACCINE  12/28/2023   DTaP/Tdap/Td (4 - Td or Tdap) 12/22/2032   Pneumonia Vaccine 8+ Years old  Completed   DEXA SCAN  Completed   Zoster Vaccines- Shingrix   Completed   HPV VACCINES  Aged Out   Meningococcal B Vaccine  Aged Out    Physical Exam: There were no vitals filed for this visit. There is no height or weight on file to calculate BMI. Physical Exam Vitals reviewed.  Constitutional:      Appearance: Normal appearance.  HENT:     Head: Normocephalic.     Nose: Nose normal.     Mouth/Throat:     Mouth: Mucous membranes are moist.     Pharynx: Oropharynx is clear.  Eyes:     Pupils:  Pupils are equal, round, and reactive to light.  Cardiovascular:     Rate and Rhythm: Normal rate and regular rhythm.     Pulses: Normal pulses.     Heart sounds: Normal heart sounds. No murmur heard. Pulmonary:     Effort: Pulmonary effort is normal.     Breath sounds: Normal breath sounds.  Abdominal:     General: Abdomen is flat. Bowel sounds are normal.     Palpations: Abdomen is soft.  Musculoskeletal:        General: No swelling.     Cervical back: Neck supple.  Skin:    General: Skin is warm.     Comments: Has small places on her Lela [place] where she has been trying to pick  Neurological:     General: No focal deficit present.     Mental Status: She is alert.     Comments: Expressive aphasia  Psychiatric:        Mood and Affect: Mood normal.        Thought Content: Thought content normal.     Labs reviewed: Basic Metabolic Panel: Recent Labs    02/02/23 0000 04/12/23 0000 09/18/23 0000  NA 140 137 139  K 4.7 4.5 4.0  CL 104 103 105  CO2 22 24* 25*  BUN 25* 25* 23*  CREATININE 0.7 0.6 0.4*  CALCIUM  8.7 9.1 8.4*   Liver Function Tests: Recent Labs    09/18/23 0000  AST 13  ALT 6*  ALKPHOS 51   No results for input(s): "LIPASE", "AMYLASE" in the last 8760 hours. No results for input(s): "AMMONIA" in the last 8760 hours. CBC: Recent Labs    04/12/23 0000  WBC 5.0  HGB 11.4*  HCT 35*  PLT 151   Lipid Panel: No results for input(s): "CHOL", "HDL", "LDLCALC", "TRIG", "CHOLHDL", "LDLDIRECT" in the last 8760  hours. No results found for: "HGBA1C"  Procedures since last visit: No results found.  Assessment/Plan 1. Senile osteoporosis (Primary) D/w Aron Lard At her age with her being Non Ambulatory Will discontinue Prolia  And start her on Calcium  and Vitamin D   2. Essential hypertension BP consistently high Will change her Losartan  to 50 mg BID  3. Compulsive skin picking Change Zoloft to 50 mg QD  4.[facility] Doing well in SNF  5. Chronic constipation ? Diarrhea Change Miralax  to 2/week  6. Paroxysmal atrial fibrillation (HCC) No Aggressive treatment Now on Low dose of Metoprolol  7. Primary osteoarthritis involving multiple joints Tylenol     Labs/tests ordered:  Labs Done recently were all in Good limits Next appt:  Visit date not found

## 2023-10-05 ENCOUNTER — Ambulatory Visit: Payer: Medicare HMO

## 2023-10-08 ENCOUNTER — Encounter: Payer: Self-pay | Admitting: Adult Health

## 2023-10-08 ENCOUNTER — Non-Acute Institutional Stay (SKILLED_NURSING_FACILITY): Payer: Self-pay | Admitting: Adult Health

## 2023-10-08 DIAGNOSIS — G8929 Other chronic pain: Secondary | ICD-10-CM | POA: Diagnosis not present

## 2023-10-08 DIAGNOSIS — M545 Low back pain, unspecified: Secondary | ICD-10-CM

## 2023-10-08 DIAGNOSIS — I10 Essential (primary) hypertension: Secondary | ICD-10-CM

## 2023-10-08 DIAGNOSIS — I480 Paroxysmal atrial fibrillation: Secondary | ICD-10-CM | POA: Diagnosis not present

## 2023-10-08 MED ORDER — HYDRALAZINE HCL 10 MG PO TABS: 10.0000 mg | ORAL_TABLET | Freq: Two times a day (BID) | ORAL | Status: DC

## 2023-10-08 NOTE — Progress Notes (Signed)
 [facility] Room Number: 135 A Place of Service:  SNF [phone]) Provider:  Raylene Calamity, NP    Patient Care Team: Marguerite Shiley, MD as PCP - General (Internal Medicine) Lanita Pitman, MD as Consulting Physician (Gastroenterology) Christie Cox, MD as Consulting Physician (Orthopedic Surgery) Chrystal Crape as Consulting Physician (Dermatology) Driscilla George, MD as Consulting Physician (Cardiology) Dema Filler, MD as Consulting Physician (Ophthalmology) Eva Hikes, MD as Referring Physician (Dermatology)  Extended Emergency Contact Information Primary Emergency Contact: Wright,Janet  [place] Home Phone: [phone] Relation: Daughter Secondary Emergency Contact: [facility] Address: [address] [place] [address] [place] Home Phone: [phone] Mobile Phone: [phone] Relation: Son  Code Status:  DNR Goals of care: Advanced Directive information    06/18/2023    3:51 PM  Advanced Directives  Does Patient Have a Medical Advance Directive? Yes  Type of Estate agent of [place];Out of facility DNR (pink MOST or yellow form);Living will  Does patient want to make changes to medical advance directive? No - Patient declined  Copy of Healthcare Power of Attorney in Chart? Yes - validated most recent copy scanned in chart (See row information)  Pre-existing out of facility DNR order (yellow form or pink MOST form) Yellow form placed in chart (order not valid for inpatient use);Pink MOST form placed in chart (order not valid for inpatient use)     Chief Complaint  Patient presents with   Acute Visit    HPI:  Pt is a 89 y.o. female seen today for an acute visit for HTN  She has a hx of afib on metoprolol for rate control. Not on DOAC due to age risk.  Also she has a hx of HTN and losartan  was increased to BID on 4/28. The nurse reports her  BPs are sometimes as high as 190/100. This am 157/86. She is not short of breath or having chest pain. Seems calm with her son at bedside. She complains of arthritis related Pain all over and uses tylenol  three times a day which helps.  Weight is down by 4 lbs BUN/Cr 23/0.4 09/18/23 Expresses some frustration about having to use a bed pan now that she is no longer ambulatory.   Past Medical History:  Diagnosis Date   Arthritis    "all over"   BACK PAIN 03/08/2007   Cancer (HCC)    squamous cell on R leg & face- ?basal cell   Cutaneous abscess of right lower limb    DEPRESSIVE DISORDER 12/01/2008   Disruption of external operation (surgical) wound, not elsewhere classified, initial encounter    Duodenal ulcer    Essential (primary) hypertension    GI bleed 09/12/2010   Naproxen induced Endoscopy with dr Cassaundra Cleverly    HIATAL HERNIA 12/17/2006   History of hiatal hernia    HYPERLIPIDEMIA 03/09/2006   Hyperlipidemia, unspecified    HYPERTENSION 03/09/2006   KNEE PAIN 05/06/2009   Macular degeneration    Major depressive disorder, single episode, unspecified    Pneumonia    42yrs. ago- hosp. pneumonia   Urinary urgency    Past Surgical History:  Procedure Laterality Date   ABDOMINAL HYSTERECTOMY  1975   APPLICATION OF A-CELL OF EXTREMITY Right 12/10/2014   Procedure: APPLICATION OF A-CELL OF EXTREMITY;  Surgeon: Marilou Showman, DO;  [facility]: [facility];  Service: Plastics;  Laterality: Right;   arthroscopic knee Right  x 2   BACK SURGERY  2000   spinal fusion   BILATERAL SALPINGECTOMY Bilateral 1989   Dr. Angel Kelch   CARDIAC CATHETERIZATION     EYE SURGERY     cataracts bilateral - removed   HERNIA REPAIR Bilateral    inguinal    I & D EXTREMITY Right 12/10/2014   Procedure: IRRIGATION AND DEBRIDEMENT ON RIGHT LEG, ;  Surgeon: Marilou Showman, DO;  Location: [facility] OR;  Service: Plastics;  Laterality: Right;   I & D EXTREMITY Right 01/20/2015   Procedure: IRRIGATION AND DEBRIDEMENT RIGHT  LOWER LEG WOUND, with ACELL to Donor Site, SKIN GRAFT AND VAC Placement;  Surgeon: Marilou Showman, DO;  Location: [facility] OR;  Service: Plastics;  Laterality: Right;   LUMBAR LAMINECTOMY  02-17-1999   with sinal fusion L3,4,5,&S1   moes     moses surgery on R lle   OOPHORECTOMY  1989   RESECTION DISTAL CLAVICAL     ROTATOR CUFF REPAIR Right 2001   x2   SKIN GRAFT Right 05/29/2018   THYROIDECTOMY     TONSILLECTOMY  1928   TUBAL LIGATION      Allergies  Allergen Reactions   Naproxen     Gi bleed   Nsaids     GI BLEED   Aspirin     GI bleed   Ace Inhibitors     Cough    Caffeine Other (See Comments)    Causes joints to be painful    Outpatient Encounter Medications as of 10/08/2023  Medication Sig   acetaminophen  (TYLENOL ) 500 MG tablet Take 1,000 mg by mouth 3 (three) times daily.   CALCIUM -VITAMIN D  PO Take by mouth.   Carboxymethylcellulose Sodium (THERATEARS) 0.25 % SOLN Apply 2-3 drops to eye daily as needed.   Emollient (CERAVE HEALING EX) Apply 1 application  topically daily as needed (bilateral lower leg xerosis cutis).   hydrALAZINE (APRESOLINE) 10 MG tablet Take 10 mg by mouth as needed. 10mg , oral, As needed, Hydralazine 10 mg TID PRN for SBP > 170   losartan  (COZAAR ) 50 MG tablet Take 1.5 tablets (75 mg total) by mouth daily. (Patient taking differently: Take 50 mg by mouth in the morning and at bedtime.)   methocarbamol (ROBAXIN) 500 MG tablet Take 500 mg by mouth every 6 (six) hours as needed for muscle spasms.   metoprolol tartrate (LOPRESSOR) 25 MG tablet Take 12.5 mg by mouth 2 (two) times daily. Hold for HR <60   polyethylene glycol powder (GLYCOLAX /MIRALAX ) 17 GM/SCOOP powder Take 17 g by mouth every other day. (Patient taking differently: Take 17 g by mouth 2 (two) times a week.)   senna (SENOKOT) 8.6 MG TABS tablet Take 2 tablets by mouth at bedtime as needed for mild constipation.   sertraline (ZOLOFT) 25 MG tablet Take 50 mg by mouth daily.   No  facility-administered encounter medications on file as of 10/08/2023.    Review of Systems  Constitutional:  Negative for activity change, appetite change, chills, diaphoresis, fatigue, fever and unexpected weight change.  HENT:  Negative for congestion.   Respiratory:  Negative for cough, shortness of breath and wheezing.   Cardiovascular:  Negative for chest pain, palpitations and leg swelling.  Gastrointestinal:  Negative for abdominal distention, abdominal pain, constipation and diarrhea.  Genitourinary:  Negative for difficulty urinating and dysuria.  Musculoskeletal:  Positive for arthralgias and gait problem. Negative for back pain, joint swelling and myalgias.  Neurological:  Negative for dizziness, tremors, seizures, syncope, facial asymmetry,  speech difficulty, weakness, light-headedness, numbness and headaches.  Psychiatric/Behavioral:  Positive for confusion. Negative for agitation and behavioral problems.     Immunization History  Administered Date(s) Administered   Fluad Quad(high Dose 65+) 04/05/2020   Fluad Trivalent(High Dose 65+) 03/20/2023   Influenza Split 03/08/2011, 03/05/2012   Influenza Whole 03/08/2007, 03/02/2009, 03/29/2010   Influenza, High Dose Seasonal PF 03/21/2013, 06/13/2017, 03/07/2019, 03/26/2020   Influenza,inj,Quad PF,6+ Mos 02/20/2014, 02/11/2015, 03/22/2018   Influenza-Unspecified 03/23/2016, 04/05/2020, 03/03/2022   Moderna Covid-19 Fall Seasonal Vaccine 42yrs & older 09/21/2022   Moderna Covid-19 Vaccine Bivalent Booster 27yrs & up 03/10/2021, 03/20/2023   Moderna SARS-COV2 Booster Vaccination 09/20/2020   Moderna Sars-Covid-2 Vaccination 06/10/2019, 07/09/2019, 10/13/2021, 04/03/2022   PFIZER(Purple Top)SARS-COV-2 Vaccination 04/05/2020   Pneumococcal Conjugate-13 07/30/2014   Pneumococcal Polysaccharide-23 02/03/2002, 12/16/2009   Td 02/03/2002   Tdap 12/11/2012, 12/23/2022   Zoster Recombinant(Shingrix) 05/10/2022, 07/11/2022   Pertinent   Health Maintenance Due  Topic Date Due   INFLUENZA VACCINE  12/28/2023   DEXA SCAN  Completed      08/08/2022    1:26 PM 11/20/2022    1:03 PM 12/22/2022    9:56 AM 01/09/2023    2:29 PM 04/23/2023    9:44 AM  Fall Risk  Falls in the past year? 1 1 1 1  0  Was there an injury with Fall? 1 1 1  0 0  Fall Risk Category Calculator 2 3 3 1  0  Patient at Risk for Falls Due to History of fall(s) History of fall(s);Impaired balance/gait;Impaired mobility;Impaired vision History of fall(s);Impaired balance/gait History of fall(s);Impaired balance/gait No Fall Risks  Fall risk Follow up Falls evaluation completed Falls evaluation completed Falls evaluation completed Falls evaluation completed;Education provided Falls evaluation completed   Functional Status Survey:    Vitals:   10/08/23 1130  BP: (!) 157/86  Pulse: 85  Resp: 18  Temp: 97.8 F (36.6 C)  SpO2: 91%  Weight: 120 lb 3.2 oz (54.5 kg)  Height: 4\' 11"  (1.499 m)   Body mass index is 24.28 kg/m. Wt Readings from Last 3 Encounters:  10/08/23 120 lb 3.2 oz (54.5 kg)  09/24/23 124 lb 9.6 oz (56.5 kg)  08/10/23 124 lb 9.6 oz (56.5 kg)    Physical Exam Vitals and nursing note reviewed.  Constitutional:      Appearance: Normal appearance.  Cardiovascular:     Rate and Rhythm: Normal rate. Rhythm irregular.     Heart sounds: No murmur heard. Pulmonary:     Effort: Pulmonary effort is normal.     Breath sounds: Normal breath sounds.  Musculoskeletal:     Right lower leg: Edema (trace) present.     Left lower leg: No edema.  Skin:    General: Skin is warm and dry.  Neurological:     General: No focal deficit present.     Mental Status: She is alert. Mental status is at baseline.  Psychiatric:        Mood and Affect: Mood normal.     Labs reviewed: Recent Labs    02/02/23 0000 04/12/23 0000 09/18/23 0000  NA 140 137 139  K 4.7 4.5 4.0  CL 104 103 105  CO2 22 24* 25*  BUN 25* 25* 23*  CREATININE 0.7 0.6 0.4*   CALCIUM  8.7 9.1 8.4*   Recent Labs    09/18/23 0000  AST 13  ALT 6*  ALKPHOS 51   Recent Labs    04/12/23 0000  WBC 5.0  HGB 11.4*  HCT  35*  PLT 151   Lab Results  Component Value Date   TSH 1.39 01/02/2022   No results found for: "HGBA1C" Lab Results  Component Value Date   CHOL 238 (A) 08/20/2018   HDL 67 08/20/2018   LDLCALC 154 08/20/2018   LDLDIRECT 118.7 07/20/2011   TRIG 85 08/20/2018   CHOLHDL 4 12/30/2013    Significant Diagnostic Results in last 30 days:  No results found.  Assessment/Plan  1. Essential hypertension (Primary) Add hydralazine 10 mg bid Keep prn dosing Continue losartan  and metoprolol   2. Paroxysmal atrial fibrillation (HCC) Rate is controlled on metoprolol Not on DOAC due to age and risk   3. Chronic midline low back pain without sciatica Well controlled pain and arthralgias with tylenol    Family/ staff Communication: son at bedside  Labs/tests ordered:  NA

## 2023-10-17 DIAGNOSIS — L821 Other seborrheic keratosis: Secondary | ICD-10-CM | POA: Diagnosis not present

## 2023-10-17 DIAGNOSIS — D485 Neoplasm of uncertain behavior of skin: Secondary | ICD-10-CM | POA: Diagnosis not present

## 2023-10-17 DIAGNOSIS — L814 Other melanin hyperpigmentation: Secondary | ICD-10-CM | POA: Diagnosis not present

## 2023-11-01 ENCOUNTER — Encounter: Payer: Self-pay | Admitting: Adult Health

## 2023-11-01 ENCOUNTER — Non-Acute Institutional Stay (SKILLED_NURSING_FACILITY): Payer: Self-pay | Admitting: Adult Health

## 2023-11-01 DIAGNOSIS — I10 Essential (primary) hypertension: Secondary | ICD-10-CM | POA: Diagnosis not present

## 2023-11-01 DIAGNOSIS — R4689 Other symptoms and signs involving appearance and behavior: Secondary | ICD-10-CM | POA: Diagnosis not present

## 2023-11-01 DIAGNOSIS — G8929 Other chronic pain: Secondary | ICD-10-CM | POA: Diagnosis not present

## 2023-11-01 DIAGNOSIS — K5909 Other constipation: Secondary | ICD-10-CM | POA: Diagnosis not present

## 2023-11-01 DIAGNOSIS — M545 Low back pain, unspecified: Secondary | ICD-10-CM | POA: Diagnosis not present

## 2023-11-01 DIAGNOSIS — M810 Age-related osteoporosis without current pathological fracture: Secondary | ICD-10-CM | POA: Diagnosis not present

## 2023-11-01 DIAGNOSIS — G3184 Mild cognitive impairment, so stated: Secondary | ICD-10-CM

## 2023-11-01 MED ORDER — HYDRALAZINE HCL 10 MG PO TABS: 10.0000 mg | ORAL_TABLET | Freq: Three times a day (TID) | ORAL | Status: DC

## 2023-11-01 NOTE — Assessment & Plan Note (Signed)
 Increase hydralazine  to 10 mg tid Keep prn

## 2023-11-01 NOTE — Assessment & Plan Note (Signed)
 Off prolia  No longer weight bearing

## 2023-11-01 NOTE — Progress Notes (Signed)
 Location:  [facility] Room Number: 135A Place of Service:  SNF [phone]) Provider:  Pascha Fogal,NP  Marguerite Shiley, MD  Patient Care Team: Marguerite Shiley, MD as PCP - General (Internal Medicine) Lanita Pitman, MD as Consulting Physician (Gastroenterology) Christie Cox, MD as Consulting Physician (Orthopedic Surgery) Chrystal Crape as Consulting Physician (Dermatology) Driscilla George, MD as Consulting Physician (Cardiology) Dema Filler, MD as Consulting Physician (Ophthalmology) Eva Hikes, MD as Referring Physician (Dermatology)  Extended Emergency Contact Information Primary Emergency Contact: Wright,Janet  [place] Home Phone: [phone] Relation: Daughter Secondary Emergency Contact: [facility] Address: [address], [place] [address] [place] Home Phone: [phone] Mobile Phone: [phone] Relation: Son  Code Status:  DNR Goals of care: Advanced Directive information    11/01/2023    1:28 PM  Advanced Directives  Does Patient Have a Medical Advance Directive? Yes  Type of Estate agent of [place];Living will;Out of facility DNR (pink MOST or yellow form)  Does patient want to make changes to medical advance directive? No - Patient declined  Copy of Healthcare Power of Attorney in Chart? Yes - validated most recent copy scanned in chart (See row information)  Pre-existing out of facility DNR order (yellow form or pink MOST form) Pink MOST/Yellow Form most recent copy in chart - Physician notified to receive inpatient order     Chief Complaint  Patient presents with   Medical Management of Chronic Issues    Routine    HPI:  Pt is a 89 y.o. female seen today for medical management of chronic diseases.    She resides in skilled care due to falls and progressive memory loss .  She was started on Zoloft and taken off cymbalta  on 06/18/23 due to  compulsive picking of her faces and arms leading to wounds. This has improved.   Prior hx of OA with knee pain and chronic low back pain. No acute issues. Pain is well controlled with tylenol   She was found to be in afib/flutter in Nov of 2024. Due to her goals of care, fall risk and age she was not placed on DOAC. She is on metoprolol. She is not having palpitations or cp. Rate is controlled. TSH 1.74 04/13/23, Mg 2.0  HTN:  Bp is elevated at times. SBP 140-189/70-109 Staff uses prn hydralazine  if its over 170 Scheduled hydralazine  added in May BUN 23 Cr 0.4 09/18/23  OP: T score -4.5 04/15/19, worse from 2018 -4.3 Off prolia  now. No longer bearing weight. Remains on calcium   Constipation regular BMs noted.   Umbilical hernia: no current issues. Also has hiatal hernia.  Past Medical History:  Diagnosis Date   Arthritis    "all over"   BACK PAIN 03/08/2007   Cancer (HCC)    squamous cell on R leg & face- ?basal cell   Cutaneous abscess of right lower limb    DEPRESSIVE DISORDER 12/01/2008   Disruption of external operation (surgical) wound, not elsewhere classified, initial encounter    Duodenal ulcer    Essential (primary) hypertension    GI bleed 09/12/2010   Naproxen induced Endoscopy with dr Cassaundra Cleverly    HIATAL HERNIA 12/17/2006   History of hiatal hernia    HYPERLIPIDEMIA 03/09/2006   Hyperlipidemia, unspecified    HYPERTENSION 03/09/2006   KNEE PAIN 05/06/2009   Macular degeneration    Major depressive disorder, single episode, unspecified    Pneumonia  28yrs. ago- hosp. pneumonia   Urinary urgency    Past Surgical History:  Procedure Laterality Date   ABDOMINAL HYSTERECTOMY  1975   APPLICATION OF A-CELL OF EXTREMITY Right 12/10/2014   Procedure: APPLICATION OF A-CELL OF EXTREMITY;  Surgeon: Marilou Showman, DO;  Location: [facility] OR;  Service: Plastics;  Laterality: Right;   arthroscopic knee Right    x 2   BACK SURGERY  2000   spinal fusion   BILATERAL  SALPINGECTOMY Bilateral 1989   Dr. Angel Kelch   CARDIAC CATHETERIZATION     EYE SURGERY     cataracts bilateral - removed   HERNIA REPAIR Bilateral    inguinal    I & D EXTREMITY Right 12/10/2014   Procedure: IRRIGATION AND DEBRIDEMENT ON RIGHT LEG, ;  Surgeon: Marilou Showman, DO;  Location: [facility] OR;  Service: Plastics;  Laterality: Right;   I & D EXTREMITY Right 01/20/2015   Procedure: IRRIGATION AND DEBRIDEMENT RIGHT LOWER LEG WOUND, with ACELL to Donor Site, SKIN GRAFT AND VAC Placement;  Surgeon: Marilou Showman, DO;  Location: [facility] OR;  Service: Plastics;  Laterality: Right;   LUMBAR LAMINECTOMY  02-17-1999   with sinal fusion L3,4,5,&S1   moes     moses surgery on R lle   OOPHORECTOMY  1989   RESECTION DISTAL CLAVICAL     ROTATOR CUFF REPAIR Right 2001   x2   SKIN GRAFT Right 05/29/2018   THYROIDECTOMY     TONSILLECTOMY  1928   TUBAL LIGATION      Allergies  Allergen Reactions   Naproxen     Gi bleed   Nsaids     GI BLEED   Aspirin     GI bleed   Ace Inhibitors     Cough    Caffeine Other (See Comments)    Causes joints to be painful    Outpatient Encounter Medications as of 11/01/2023  Medication Sig   acetaminophen  (TYLENOL ) 500 MG tablet Take 1,000 mg by mouth 3 (three) times daily.   CALCIUM -VITAMIN D  PO Take by mouth.   Carboxymethylcellulose Sodium (THERATEARS) 0.25 % SOLN Apply 2-3 drops to eye daily as needed.   Emollient (CERAVE HEALING EX) Apply 1 application  topically daily as needed (bilateral lower leg xerosis cutis).   hydrALAZINE  (APRESOLINE ) 10 MG tablet Take 10 mg by mouth as needed. 10mg , oral, As needed, Hydralazine  10 mg TID PRN for SBP > 170   hydrALAZINE  (APRESOLINE ) 10 MG tablet Take 1 tablet (10 mg total) by mouth in the morning and at bedtime.   losartan  (COZAAR ) 50 MG tablet Take 1.5 tablets (75 mg total) by mouth daily. (Patient taking differently: Take 50 mg by mouth in the morning and at bedtime.)   methocarbamol (ROBAXIN) 500 MG tablet Take 500 mg  by mouth every 6 (six) hours as needed for muscle spasms.   metoprolol tartrate (LOPRESSOR) 25 MG tablet Take 12.5 mg by mouth 2 (two) times daily. Hold for HR <60   polyethylene glycol powder (GLYCOLAX /MIRALAX ) 17 GM/SCOOP powder Take 17 g by mouth every other day. (Patient taking differently: Take 17 g by mouth 2 (two) times a week.)   senna (SENOKOT) 8.6 MG TABS tablet Take 2 tablets by mouth at bedtime as needed for mild constipation.   sertraline (ZOLOFT) 25 MG tablet Take 50 mg by mouth daily.   No facility-administered encounter medications on file as of 11/01/2023.    Review of Systems  Constitutional:  Negative for activity change, appetite change, chills, diaphoresis,  fatigue, fever and unexpected weight change.  HENT:  Negative for congestion.   Respiratory:  Negative for cough, shortness of breath and wheezing.   Cardiovascular:  Negative for chest pain, palpitations and leg swelling.  Gastrointestinal:  Negative for abdominal distention, abdominal pain, constipation and diarrhea.  Genitourinary:  Negative for difficulty urinating and dysuria.  Musculoskeletal:  Positive for arthralgias and gait problem. Negative for back pain, joint swelling and myalgias.  Neurological:  Negative for dizziness, tremors, seizures, syncope, facial asymmetry, speech difficulty, weakness, light-headedness, numbness and headaches.  Psychiatric/Behavioral:  Positive for confusion. Negative for agitation and behavioral problems.     Immunization History  Administered Date(s) Administered   Fluad Quad(high Dose 65+) 04/05/2020   Fluad Trivalent(High Dose 65+) 03/20/2023   Influenza Split 03/08/2011, 03/05/2012   Influenza Whole 03/08/2007, 03/02/2009, 03/29/2010   Influenza, High Dose Seasonal PF 03/21/2013, 06/13/2017, 03/07/2019, 03/26/2020   Influenza,inj,Quad PF,6+ Mos 02/20/2014, 02/11/2015, 03/22/2018   Influenza-Unspecified 03/23/2016, 04/05/2020, 03/03/2022   Moderna Covid-19 Fall Seasonal  Vaccine 48yrs & older 09/21/2022   Moderna Covid-19 Vaccine Bivalent Booster 54yrs & up 03/10/2021, 03/20/2023   Moderna SARS-COV2 Booster Vaccination 09/20/2020   Moderna Sars-Covid-2 Vaccination 06/10/2019, 07/09/2019, 10/13/2021, 04/03/2022   PFIZER(Purple Top)SARS-COV-2 Vaccination 04/05/2020   Pneumococcal Conjugate-13 07/30/2014   Pneumococcal Polysaccharide-23 02/03/2002, 12/16/2009   Td 02/03/2002   Tdap 12/11/2012, 12/23/2022   Zoster Recombinant(Shingrix) 05/10/2022, 07/11/2022   Pertinent  Health Maintenance Due  Topic Date Due   INFLUENZA VACCINE  12/28/2023   DEXA SCAN  Completed      08/08/2022    1:26 PM 11/20/2022    1:03 PM 12/22/2022    9:56 AM 01/09/2023    2:29 PM 04/23/2023    9:44 AM  Fall Risk  Falls in the past year? 1 1 1 1  0  Was there an injury with Fall? 1 1 1  0 0  Fall Risk Category Calculator 2 3 3 1  0  Patient at Risk for Falls Due to History of fall(s) History of fall(s);Impaired balance/gait;Impaired mobility;Impaired vision History of fall(s);Impaired balance/gait History of fall(s);Impaired balance/gait No Fall Risks  Fall risk Follow up Falls evaluation completed Falls evaluation completed Falls evaluation completed Falls evaluation completed;Education provided Falls evaluation completed   Functional Status Survey:    Vitals:   11/01/23 1321 11/01/23 1324  BP: (!) 189/78 (!) 140/77  Pulse: 65   Resp: 14   Temp: 97.9 F (36.6 C)   SpO2: 94%   Weight: 120 lb 3.2 oz (54.5 kg)   Height: 4\' 11"  (1.499 m)    Body mass index is 24.28 kg/m. Physical Exam Vitals and nursing note reviewed.  Constitutional:      General: She is not in acute distress.    Appearance: She is not diaphoretic.  HENT:     Head: Normocephalic and atraumatic.  Neck:     Vascular: No JVD.  Cardiovascular:     Rate and Rhythm: Normal rate and regular rhythm.     Heart sounds: No murmur heard. Pulmonary:     Effort: Pulmonary effort is normal. No respiratory  distress.     Breath sounds: Normal breath sounds. No wheezing.  Abdominal:     General: Bowel sounds are normal. There is no distension.     Palpations: Abdomen is soft.     Tenderness: There is no abdominal tenderness.  Skin:    General: Skin is warm and dry.  Neurological:     Mental Status: She is alert. Mental status is at  baseline.  Psychiatric:        Mood and Affect: Mood normal.     Labs reviewed: Recent Labs    02/02/23 0000 04/12/23 0000 09/18/23 0000  NA 140 137 139  K 4.7 4.5 4.0  CL 104 103 105  CO2 22 24* 25*  BUN 25* 25* 23*  CREATININE 0.7 0.6 0.4*  CALCIUM  8.7 9.1 8.4*   Recent Labs    09/18/23 0000  AST 13  ALT 6*  ALKPHOS 51   Recent Labs    04/12/23 0000  WBC 5.0  HGB 11.4*  HCT 35*  PLT 151   Lab Results  Component Value Date   TSH 1.39 01/02/2022   No results found for: "HGBA1C" Lab Results  Component Value Date   CHOL 238 (A) 08/20/2018   HDL 67 08/20/2018   LDLCALC 154 08/20/2018   LDLDIRECT 118.7 07/20/2011   TRIG 85 08/20/2018   CHOLHDL 4 12/30/2013    Significant Diagnostic Results in last 30 days:  No results found.  Assessment/Plan   Essential hypertension Increase hydralazine  to 10 mg tid Keep prn  Chronic constipation Continue miralax  and prn senna  BACK PAIN Well managed with tylenol    Senile osteoporosis Off prolia  No longer weight bearing   Mild cognitive impairment with memory loss Progressing over time Pleasant with no behavioral concerns Appropriate for a skilled level of care.   Compulsive scratching behavior Improved with zoloft.

## 2023-11-01 NOTE — Assessment & Plan Note (Signed)
 Progressing over time Pleasant with no behavioral concerns Appropriate for a skilled level of care.

## 2023-11-01 NOTE — Assessment & Plan Note (Signed)
 Improved with zoloft

## 2023-11-01 NOTE — Assessment & Plan Note (Signed)
 Well managed with tylenol 

## 2023-11-01 NOTE — Assessment & Plan Note (Signed)
 Continue miralax  and prn senna

## 2023-11-28 DIAGNOSIS — H6123 Impacted cerumen, bilateral: Secondary | ICD-10-CM | POA: Diagnosis not present

## 2023-11-28 DIAGNOSIS — H6042 Cholesteatoma of left external ear: Secondary | ICD-10-CM | POA: Diagnosis not present

## 2023-11-29 ENCOUNTER — Non-Acute Institutional Stay (SKILLED_NURSING_FACILITY): Payer: Self-pay | Admitting: Adult Health

## 2023-11-29 ENCOUNTER — Encounter: Payer: Self-pay | Admitting: Adult Health

## 2023-11-29 DIAGNOSIS — I10 Essential (primary) hypertension: Secondary | ICD-10-CM

## 2023-11-29 DIAGNOSIS — R5383 Other fatigue: Secondary | ICD-10-CM | POA: Diagnosis not present

## 2023-11-29 DIAGNOSIS — R051 Acute cough: Secondary | ICD-10-CM

## 2023-11-29 NOTE — Progress Notes (Signed)
 Location:  [facility] Room Number: 135 P Place of Service:  SNF ([phone]) Provider:  Tawni America, NP    Patient Care Team: Charlanne Fredia CROME, MD as PCP - General (Internal Medicine) Donnald Charleston, MD as Consulting Physician (Gastroenterology) Rubie Kemps, MD as Consulting Physician (Orthopedic Surgery) Gabriel Dames as Consulting Physician (Dermatology) Dominick Ned, MD as Consulting Physician (Cardiology) Leslee Reusing, MD as Consulting Physician (Ophthalmology) Lloyd Savannah, MD as Referring Physician (Dermatology)  Extended Emergency Contact Information Primary Emergency Contact: Wright,Janet  [place] Home Phone: [phone] Relation: Daughter Secondary Emergency Contact: [facility] Address: [address], [place] [address] [place] Home Phone: [phone] Mobile Phone: [phone] Relation: Son  Code Status:  DNR Goals of care: Advanced Directive information    11/01/2023    1:28 PM  Advanced Directives  Does Patient Have a Medical Advance Directive? Yes  Type of Estate agent of [place];Living will;Out of facility DNR (pink MOST or yellow form)  Does patient want to make changes to medical advance directive? No - Patient declined  Copy of Healthcare Power of Attorney in Chart? Yes - validated most recent copy scanned in chart (See row information)  Pre-existing out of facility DNR order (yellow form or pink MOST form) Pink MOST/Yellow Form most recent copy in chart - Physician notified to receive inpatient order     Chief Complaint  Patient presents with   Acute Visit    Wheezing    HPI:   The 89 y.o. female patient presents with wheezing and decreased appetite. Her son is at bedside for this visit.  She has been experiencing wheezing, which others have noted, although she personally does not find it bothersome. No chest pain or palpitations.  She has a productive cough for the past few days, described as 'rattly,' and she can bring up phlegm but not spit it out. The cough is not associated with meals.  Her appetite has been poor, as evidenced by eating only a third of a half bagel for breakfast. She reports a very little appetite overall. Weight is unchanged but staff has noticed that she is sleeping more, eating less, more lethargic over time.  She recently had her ears cleaned, with the left ear canal previously occluded by wax, which was removed by an ENT specialist. Her hearing has improved slightly since the procedure.  Her blood pressure was noted to be very high this morning at 196, which is higher than usual.  Wt Readings from Last 3 Encounters:  11/29/23 121 lb 3.2 oz (55 kg)  11/01/23 120 lb 3.2 oz (54.5 kg)  10/08/23 120 lb 3.2 oz (54.5 kg)     Past Medical History:  Diagnosis Date   Arthritis    all over   BACK PAIN 03/08/2007   Cancer (HCC)    squamous cell on R leg & face- ?basal cell   Cutaneous abscess of right lower limb    DEPRESSIVE DISORDER 12/01/2008   Disruption of external operation (surgical) wound, not elsewhere classified, initial encounter    Duodenal ulcer    Essential (primary) hypertension    GI bleed 09/12/2010   Naproxen induced Endoscopy with dr jilda    HIATAL HERNIA 12/17/2006   History of hiatal hernia    HYPERLIPIDEMIA 03/09/2006   Hyperlipidemia, unspecified    HYPERTENSION 03/09/2006   KNEE PAIN 05/06/2009   Macular degeneration    Major depressive  disorder, single episode, unspecified    Pneumonia    71yrs. ago- hosp. pneumonia   Urinary urgency    Past Surgical History:  Procedure Laterality Date   ABDOMINAL HYSTERECTOMY  1975   APPLICATION OF A-CELL OF EXTREMITY Right 12/10/2014   Procedure: APPLICATION OF A-CELL OF EXTREMITY;  Surgeon: Estefana Reichert, DO;  Location: [facility] OR;  Service: Plastics;  Laterality: Right;   arthroscopic knee Right    x 2   BACK SURGERY  2000    spinal fusion   BILATERAL SALPINGECTOMY Bilateral 1989   Dr. Ted   CARDIAC CATHETERIZATION     EYE SURGERY     cataracts bilateral - removed   HERNIA REPAIR Bilateral    inguinal    I & D EXTREMITY Right 12/10/2014   Procedure: IRRIGATION AND DEBRIDEMENT ON RIGHT LEG, ;  Surgeon: Estefana Reichert, DO;  Location: [facility] OR;  Service: Plastics;  Laterality: Right;   I & D EXTREMITY Right 01/20/2015   Procedure: IRRIGATION AND DEBRIDEMENT RIGHT LOWER LEG WOUND, with ACELL to Donor Site, SKIN GRAFT AND VAC Placement;  Surgeon: Estefana Reichert, DO;  Location: [facility] OR;  Service: Plastics;  Laterality: Right;   LUMBAR LAMINECTOMY  02-17-1999   with sinal fusion L3,4,5,&S1   moes     moses surgery on R lle   OOPHORECTOMY  1989   RESECTION DISTAL CLAVICAL     ROTATOR CUFF REPAIR Right 2001   x2   SKIN GRAFT Right 05/29/2018   THYROIDECTOMY     TONSILLECTOMY  1928   TUBAL LIGATION      Allergies  Allergen Reactions   Naproxen     Gi bleed   Nsaids     GI BLEED   Aspirin     GI bleed   Ace Inhibitors     Cough    Caffeine Other (See Comments)    Causes joints to be painful    Outpatient Encounter Medications as of 11/29/2023  Medication Sig   acetaminophen  (TYLENOL ) 500 MG tablet Take 1,000 mg by mouth 3 (three) times daily.   CALCIUM -VITAMIN D  PO Take by mouth.   camphor-menthol (SARNA) lotion Apply 1 Application topically every 8 (eight) hours as needed for itching.   Carboxymethylcellulose Sodium (THERATEARS) 0.25 % SOLN Apply 2-3 drops to eye daily as needed.   Emollient (CERAVE HEALING EX) Apply 1 application  topically daily as needed (bilateral lower leg xerosis cutis).   hydrALAZINE  (APRESOLINE ) 10 MG tablet Take 10 mg by mouth as needed. 10mg , oral, As needed, Hydralazine  10 mg TID PRN for SBP > 170   hydrALAZINE  (APRESOLINE ) 10 MG tablet Take 1 tablet (10 mg total) by mouth 3 (three) times daily.   ipratropium-albuterol (DUONEB) 0.5-2.5 (3) MG/3ML SOLN Inhale 3 mLs into the  lungs 2 (two) times daily.   losartan  (COZAAR ) 50 MG tablet Take 1.5 tablets (75 mg total) by mouth daily. (Patient taking differently: Take 50 mg by mouth in the morning and at bedtime.)   methocarbamol (ROBAXIN) 500 MG tablet Take 500 mg by mouth every 6 (six) hours as needed for muscle spasms.   metoprolol tartrate (LOPRESSOR) 25 MG tablet Take 12.5 mg by mouth 2 (two) times daily. Hold for HR <60   polyethylene glycol powder (GLYCOLAX /MIRALAX ) 17 GM/SCOOP powder Take 17 g by mouth every other day. (Patient taking differently: Take 17 g by mouth 2 (two) times a week.)   senna (SENOKOT) 8.6 MG TABS tablet Take 2 tablets by mouth at bedtime as needed  for mild constipation.   sertraline (ZOLOFT) 25 MG tablet Take 50 mg by mouth daily.   No facility-administered encounter medications on file as of 11/29/2023.    Review of Systems  Constitutional:  Positive for activity change, appetite change and fatigue. Negative for chills, diaphoresis, fever and unexpected weight change.  HENT:  Negative for congestion.   Respiratory:  Positive for cough and wheezing. Negative for shortness of breath.   Cardiovascular:  Negative for chest pain, palpitations and leg swelling.  Gastrointestinal:  Negative for abdominal distention, abdominal pain, constipation and diarrhea.  Genitourinary:  Negative for difficulty urinating and dysuria.  Musculoskeletal:  Positive for arthralgias and gait problem. Negative for back pain, joint swelling and myalgias.  Neurological:  Negative for dizziness, tremors, seizures, syncope, facial asymmetry, speech difficulty, weakness, light-headedness, numbness and headaches.  Psychiatric/Behavioral:  Positive for confusion. Negative for agitation and behavioral problems.     Immunization History  Administered Date(s) Administered   Fluad Quad(high Dose 65+) 04/05/2020   Fluad Trivalent(High Dose 65+) 03/20/2023   Influenza Split 03/08/2011, 03/05/2012   Influenza Whole  03/08/2007, 03/02/2009, 03/29/2010   Influenza, High Dose Seasonal PF 03/21/2013, 06/13/2017, 03/07/2019, 03/26/2020   Influenza,inj,Quad PF,6+ Mos 02/20/2014, 02/11/2015, 03/22/2018   Influenza-Unspecified 03/23/2016, 04/05/2020, 03/03/2022   Moderna Covid-19 Fall Seasonal Vaccine 54yrs & older 09/21/2022   Moderna Covid-19 Vaccine Bivalent Booster 27yrs & up 03/10/2021, 03/20/2023   Moderna SARS-COV2 Booster Vaccination 09/20/2020   Moderna Sars-Covid-2 Vaccination 06/10/2019, 07/09/2019, 10/13/2021, 04/03/2022   PFIZER(Purple Top)SARS-COV-2 Vaccination 04/05/2020   Pneumococcal Conjugate-13 07/30/2014   Pneumococcal Polysaccharide-23 02/03/2002, 12/16/2009   Td 02/03/2002   Tdap 12/11/2012, 12/23/2022   Zoster Recombinant(Shingrix) 05/10/2022, 07/11/2022   Pertinent  Health Maintenance Due  Topic Date Due   INFLUENZA VACCINE  12/28/2023   DEXA SCAN  Completed      08/08/2022    1:26 PM 11/20/2022    1:03 PM 12/22/2022    9:56 AM 01/09/2023    2:29 PM 04/23/2023    9:44 AM  Fall Risk  Falls in the past year? 1 1 1 1  0  Was there an injury with Fall? 1 1 1  0 0  Fall Risk Category Calculator 2 3 3 1  0  Patient at Risk for Falls Due to History of fall(s) History of fall(s);Impaired balance/gait;Impaired mobility;Impaired vision History of fall(s);Impaired balance/gait History of fall(s);Impaired balance/gait No Fall Risks  Fall risk Follow up Falls evaluation completed Falls evaluation completed Falls evaluation completed Falls evaluation completed;Education provided Falls evaluation completed   Functional Status Survey:    Vitals:   11/29/23 0950  BP: (!) 196/84  Pulse: 66  Resp: 15  Temp: (!) 96.8 F (36 C)  SpO2: 95%  Weight: 121 lb 3.2 oz (55 kg)  Height: 4' 11 (1.499 m)   Body mass index is 24.48 kg/m. Physical Exam Vitals and nursing note reviewed.  Constitutional:      General: She is not in acute distress.    Appearance: Normal appearance. She is not  diaphoretic.  HENT:     Head: Normocephalic and atraumatic.     Right Ear: Tympanic membrane normal.     Left Ear: Tympanic membrane normal.     Nose: No congestion.     Mouth/Throat:     Mouth: Mucous membranes are moist.     Pharynx: Oropharynx is clear. No oropharyngeal exudate or posterior oropharyngeal erythema.  Eyes:     Conjunctiva/sclera: Conjunctivae normal.     Pupils: Pupils are equal, round, and  reactive to light.  Neck:     Thyroid : No thyromegaly.     Vascular: No carotid bruit or JVD.  Cardiovascular:     Rate and Rhythm: Normal rate and regular rhythm.     Heart sounds: Normal heart sounds. No murmur heard. Pulmonary:     Effort: Pulmonary effort is normal. No respiratory distress.     Breath sounds: No stridor. Wheezing and rales present.  Abdominal:     General: Bowel sounds are normal. There is no distension.     Palpations: Abdomen is soft.     Tenderness: There is no abdominal tenderness.  Musculoskeletal:     Cervical back: No rigidity. No muscular tenderness.     Right lower leg: No edema.     Left lower leg: No edema.  Lymphadenopathy:     Cervical: No cervical adenopathy.  Skin:    General: Skin is warm and dry.  Neurological:     Mental Status: She is alert. Mental status is at baseline.     Cranial Nerves: No cranial nerve deficit.  Psychiatric:        Mood and Affect: Mood normal.     Labs reviewed: Recent Labs    02/02/23 0000 04/12/23 0000 09/18/23 0000  NA 140 137 139  K 4.7 4.5 4.0  CL 104 103 105  CO2 22 24* 25*  BUN 25* 25* 23*  CREATININE 0.7 0.6 0.4*  CALCIUM  8.7 9.1 8.4*   Recent Labs    09/18/23 0000  AST 13  ALT 6*  ALKPHOS 51   Recent Labs    04/12/23 0000  WBC 5.0  HGB 11.4*  HCT 35*  PLT 151   Lab Results  Component Value Date   TSH 1.39 01/02/2022   No results found for: HGBA1C Lab Results  Component Value Date   CHOL 238 (A) 08/20/2018   HDL 67 08/20/2018   LDLCALC 154 08/20/2018   LDLDIRECT  118.7 07/20/2011   TRIG 85 08/20/2018   CHOLHDL 4 12/30/2013    Significant Diagnostic Results in last 30 days:  No results found.  Assessment/Plan Cough Mild wheezing and rales noted. Differential includes pneumonia or heart failure. Chest x-ray considered based on care goals to guide treatment. Antibiotics if pneumonia confirmed; otherwise, focus on comfort care. - Administer nebs as needed for cough/wheeze - Consult with family regarding goals of care and decision to order chest x-ray.  Hypertension Blood pressure elevated x 1 - Recheck blood pressure improved 142/66 Continue metoprolol and hydralazine .   Goals of Care Discussed goals of care with family, considering age and symptoms. Family has requested hospice due to weight loss, lethargy, decreased appetite. Diagnostic tests influenced by preference for comfort care versus active treatment. - Consult with family regarding goals of care and decision to order chest x-ray. - Coordinate with hospice care team.

## 2023-12-03 ENCOUNTER — Encounter: Payer: Self-pay | Admitting: Internal Medicine

## 2023-12-03 ENCOUNTER — Non-Acute Institutional Stay (SKILLED_NURSING_FACILITY): Payer: Self-pay | Admitting: Internal Medicine

## 2023-12-03 DIAGNOSIS — I10 Essential (primary) hypertension: Secondary | ICD-10-CM | POA: Diagnosis not present

## 2023-12-03 DIAGNOSIS — R052 Subacute cough: Secondary | ICD-10-CM | POA: Diagnosis not present

## 2023-12-03 NOTE — Progress Notes (Unsigned)
 Location:  [facility] Room Number: 135-P Place of Service:  SNF [phone]) Provider:  Charlanne Fredia CROME, MD  Patient Care Team: Charlanne Fredia CROME, MD as PCP - General (Internal Medicine) Donnald Charleston, MD as Consulting Physician (Gastroenterology) Rubie Kemps, MD as Consulting Physician (Orthopedic Surgery) Gabriel Dames as Consulting Physician (Dermatology) Dominick Ned, MD as Consulting Physician (Cardiology) Leslee Reusing, MD as Consulting Physician (Ophthalmology) Lloyd Savannah, MD as Referring Physician (Dermatology)  Extended Emergency Contact Information Primary Emergency Contact: Wright,Janet  [place] Home Phone: [phone] Relation: Daughter Secondary Emergency Contact: [facility] Address: [address] [place] [address] [place] Home Phone: [phone] Mobile Phone: [phone] Relation: Son  Code Status:  DNR Goals of care: Advanced Directive information    11/01/2023    1:28 PM  Advanced Directives  Does Patient Have a Medical Advance Directive? Yes  Type of Estate agent of [place];Living will;Out of facility DNR (pink MOST or yellow form)  Does patient want to make changes to medical advance directive? No - Patient declined  Copy of Healthcare Power of Attorney in Chart? Yes - validated most recent copy scanned in chart (See row information)  Pre-existing out of facility DNR order (yellow form or pink MOST form) Pink MOST/Yellow Form most recent copy in chart - Physician notified to receive inpatient order     Chief Complaint  Patient presents with   Cough    HPI:  Pt is a 89 y.o. female seen today for an acute visit for Productive Cough  Lives in SNF in [place]   Patient has a history of osteoporosis, trochanteric bursitis, left knee osteoarthritis, depression, hypertension    H/o Osteoporosis She is on Prolia  per [facility]  office   Compulsive  skin picking She also has developed expressive aphasia.  Patient also has now become more weaker and is needing Deitra for her transfers  Acute Issues  Noticed to have cough for past few weeks Also Expiratory Wheezing Patient says she has some SOB also No Fever No Chest pain     Past Medical History:  Diagnosis Date   Arthritis    all over   BACK PAIN 03/08/2007   Cancer (HCC)    squamous cell on R leg & face- ?basal cell   Cutaneous abscess of right lower limb    DEPRESSIVE DISORDER 12/01/2008   Disruption of external operation (surgical) wound, not elsewhere classified, initial encounter    Duodenal ulcer    Essential (primary) hypertension    GI bleed 09/12/2010   Naproxen induced Endoscopy with dr jilda    HIATAL HERNIA 12/17/2006   History of hiatal hernia    HYPERLIPIDEMIA 03/09/2006   Hyperlipidemia, unspecified    HYPERTENSION 03/09/2006   KNEE PAIN 05/06/2009   Macular degeneration    Major depressive disorder, single episode, unspecified    Pneumonia    4yrs. ago- hosp. pneumonia   Urinary urgency    Past Surgical History:  Procedure Laterality Date   ABDOMINAL HYSTERECTOMY  1975   APPLICATION OF A-CELL OF EXTREMITY Right 12/10/2014   Procedure: APPLICATION OF A-CELL OF EXTREMITY;  Surgeon: Estefana Reichert, DO;  Location: [facility] OR;  Service: Plastics;  Laterality: Right;   arthroscopic knee Right    x 2   BACK SURGERY  2000   spinal fusion   BILATERAL SALPINGECTOMY Bilateral 1989   Dr. Ted   CARDIAC CATHETERIZATION  EYE SURGERY     cataracts bilateral - removed   HERNIA REPAIR Bilateral    inguinal    I & D EXTREMITY Right 12/10/2014   Procedure: IRRIGATION AND DEBRIDEMENT ON RIGHT LEG, ;  Surgeon: Estefana Reichert, DO;  Location: [facility] OR;  Service: Plastics;  Laterality: Right;   I & D EXTREMITY Right 01/20/2015   Procedure: IRRIGATION AND DEBRIDEMENT RIGHT LOWER LEG WOUND, with ACELL to Donor Site, SKIN GRAFT AND VAC Placement;  Surgeon: Estefana Reichert, DO;  Location: [facility] OR;  Service: Plastics;  Laterality: Right;   LUMBAR LAMINECTOMY  02-17-1999   with sinal fusion L3,4,5,&S1   moes     moses surgery on R lle   OOPHORECTOMY  1989   RESECTION DISTAL CLAVICAL     ROTATOR CUFF REPAIR Right 2001   x2   SKIN GRAFT Right 05/29/2018   THYROIDECTOMY     TONSILLECTOMY  1928   TUBAL LIGATION      Allergies  Allergen Reactions   Naproxen     Gi bleed   Nsaids     GI BLEED   Aspirin     GI bleed   Ace Inhibitors     Cough    Caffeine Other (See Comments)    Causes joints to be painful    Outpatient Encounter Medications as of 12/03/2023  Medication Sig   acetaminophen  (TYLENOL ) 500 MG tablet Take 1,000 mg by mouth 3 (three) times daily.   CALCIUM -VITAMIN D  PO Take by mouth.   camphor-menthol (SARNA) lotion Apply 1 Application topically every 8 (eight) hours as needed for itching.   Carboxymethylcellulose Sodium (THERATEARS) 0.25 % SOLN Apply 2-3 drops to eye daily as needed.   Emollient (CERAVE HEALING EX) Apply 1 application  topically daily as needed (bilateral lower leg xerosis cutis).   hydrALAZINE  (APRESOLINE ) 10 MG tablet Take 10 mg by mouth as needed. 10mg , oral, As needed, Hydralazine  10 mg TID PRN for SBP > 170   hydrALAZINE  (APRESOLINE ) 10 MG tablet Take 1 tablet (10 mg total) by mouth 3 (three) times daily.   losartan  (COZAAR ) 50 MG tablet Take 1.5 tablets (75 mg total) by mouth daily. (Patient taking differently: Take 50 mg by mouth in the morning and at bedtime.)   methocarbamol (ROBAXIN) 500 MG tablet Take 500 mg by mouth every 6 (six) hours as needed for muscle spasms.   metoprolol tartrate (LOPRESSOR) 25 MG tablet Take 12.5 mg by mouth 2 (two) times daily. Hold for HR <60   polyethylene glycol powder (GLYCOLAX /MIRALAX ) 17 GM/SCOOP powder Take 17 g by mouth every other day. (Patient taking differently: Take 17 g by mouth 2 (two) times a week. Every Tuesday and Friday)   senna (SENOKOT) 8.6 MG TABS tablet Take 2  tablets by mouth at bedtime as needed for mild constipation.   sertraline (ZOLOFT) 25 MG tablet Take 50 mg by mouth daily.   ipratropium-albuterol (DUONEB) 0.5-2.5 (3) MG/3ML SOLN Inhale 3 mLs into the lungs 2 (two) times daily. (Patient not taking: Reported on 12/03/2023)   No facility-administered encounter medications on file as of 12/03/2023.    Review of Systems  Constitutional:  Negative for activity change and appetite change.  HENT: Negative.    Respiratory:  Positive for cough and shortness of breath.   Cardiovascular:  Negative for leg swelling.  Gastrointestinal:  Negative for constipation.  Genitourinary: Negative.   Musculoskeletal:  Positive for gait problem. Negative for arthralgias and myalgias.  Skin: Negative.   Neurological:  Positive for weakness. Negative for dizziness.  Psychiatric/Behavioral:  Positive for confusion. Negative for dysphoric mood and sleep disturbance.     Immunization History  Administered Date(s) Administered   Fluad Quad(high Dose 65+) 04/05/2020   Fluad Trivalent(High Dose 65+) 03/20/2023   Influenza Split 03/08/2011, 03/05/2012   Influenza Whole 03/08/2007, 03/02/2009, 03/29/2010   Influenza, High Dose Seasonal PF 03/21/2013, 06/13/2017, 03/07/2019, 03/26/2020   Influenza,inj,Quad PF,6+ Mos 02/20/2014, 02/11/2015, 03/22/2018   Influenza-Unspecified 03/23/2016, 04/05/2020, 03/03/2022   Moderna Covid-19 Fall Seasonal Vaccine 62yrs & older 09/21/2022   Moderna Covid-19 Vaccine Bivalent Booster 21yrs & up 03/10/2021, 03/20/2023   Moderna SARS-COV2 Booster Vaccination 09/20/2020   Moderna Sars-Covid-2 Vaccination 06/10/2019, 07/09/2019, 10/13/2021, 04/03/2022   PFIZER(Purple Top)SARS-COV-2 Vaccination 04/05/2020   Pneumococcal Conjugate-13 07/30/2014   Pneumococcal Polysaccharide-23 02/03/2002, 12/16/2009   Td 02/03/2002   Tdap 12/11/2012, 12/23/2022   Zoster Recombinant(Shingrix) 05/10/2022, 07/11/2022   Pertinent  Health Maintenance Due   Topic Date Due   INFLUENZA VACCINE  12/28/2023   DEXA SCAN  Completed      08/08/2022    1:26 PM 11/20/2022    1:03 PM 12/22/2022    9:56 AM 01/09/2023    2:29 PM 04/23/2023    9:44 AM  Fall Risk  Falls in the past year? 1 1 1 1  0  Was there an injury with Fall? 1 1 1  0 0  Fall Risk Category Calculator 2 3 3 1  0  Patient at Risk for Falls Due to History of fall(s) History of fall(s);Impaired balance/gait;Impaired mobility;Impaired vision History of fall(s);Impaired balance/gait History of fall(s);Impaired balance/gait No Fall Risks  Fall risk Follow up Falls evaluation completed Falls evaluation completed Falls evaluation completed Falls evaluation completed;Education provided Falls evaluation completed   Functional Status Survey:    Vitals:   12/03/23 1407 12/03/23 1408  BP: (!) 153/83 (!) 150/94  Pulse: 91   SpO2: 96%   Weight: 121 lb 3.2 oz (55 kg)   Height: 4' 11 (1.499 m)    Body mass index is 24.48 kg/m. Physical Exam Vitals reviewed.  Constitutional:      Appearance: Normal appearance.  HENT:     Head: Normocephalic.     Nose: Nose normal.     Mouth/Throat:     Mouth: Mucous membranes are moist.     Pharynx: Oropharynx is clear.  Eyes:     Pupils: Pupils are equal, round, and reactive to light.  Cardiovascular:     Rate and Rhythm: Normal rate and regular rhythm.     Pulses: Normal pulses.     Heart sounds: Normal heart sounds. No murmur heard. Pulmonary:     Effort: Pulmonary effort is normal.     Breath sounds: Rales present.     Comments: Rales noticed on Right Lower Base Also has Expiratory Wheezing Abdominal:     General: Abdomen is flat. Bowel sounds are normal.     Palpations: Abdomen is soft.  Musculoskeletal:        General: No swelling.     Cervical back: Neck supple.  Skin:    General: Skin is warm.  Neurological:     General: No focal deficit present.     Mental Status: She is alert.  Psychiatric:        Mood and Affect: Mood normal.         Thought Content: Thought content normal.     Labs reviewed: Recent Labs    02/02/23 0000 04/12/23 0000 09/18/23 0000  NA 140 137 139  K 4.7 4.5 4.0  CL 104 103 105  CO2 22 24* 25*  BUN 25* 25* 23*  CREATININE 0.7 0.6 0.4*  CALCIUM  8.7 9.1 8.4*   Recent Labs    09/18/23 0000  AST 13  ALT 6*  ALKPHOS 51   Recent Labs    04/12/23 0000  WBC 5.0  HGB 11.4*  HCT 35*  PLT 151   Lab Results  Component Value Date   TSH 1.74 04/13/2023   No results found for: HGBA1C Lab Results  Component Value Date   CHOL 238 (A) 08/20/2018   HDL 67 08/20/2018   LDLCALC 154 08/20/2018   LDLDIRECT 118.7 07/20/2011   TRIG 85 08/20/2018   CHOLHDL 4 12/30/2013    Significant Diagnostic Results in last 30 days:  No results found.  Assessment/Plan 1. Subacute cough (Primary) With Wheezing  Chest Xray  Duo Nebs BID for 1 week Prednisone 20 mg every day for 7 days  Reassess if Cough not better  Xray Came back negative     Family/ staff Communication: Son in the room  Labs/tests ordered:

## 2023-12-04 MED ORDER — LOSARTAN POTASSIUM 50 MG PO TABS
50.0000 mg | ORAL_TABLET | Freq: Two times a day (BID) | ORAL | Status: AC
Start: 2023-12-04 — End: ?

## 2024-01-03 ENCOUNTER — Encounter: Payer: Self-pay | Admitting: Adult Health

## 2024-01-03 ENCOUNTER — Non-Acute Institutional Stay (SKILLED_NURSING_FACILITY): Payer: Self-pay | Admitting: Adult Health

## 2024-01-03 DIAGNOSIS — M810 Age-related osteoporosis without current pathological fracture: Secondary | ICD-10-CM

## 2024-01-03 DIAGNOSIS — I480 Paroxysmal atrial fibrillation: Secondary | ICD-10-CM

## 2024-01-03 DIAGNOSIS — I10 Essential (primary) hypertension: Secondary | ICD-10-CM | POA: Diagnosis not present

## 2024-01-03 DIAGNOSIS — R4689 Other symptoms and signs involving appearance and behavior: Secondary | ICD-10-CM

## 2024-01-03 DIAGNOSIS — F0390 Unspecified dementia without behavioral disturbance: Secondary | ICD-10-CM

## 2024-01-03 DIAGNOSIS — E890 Postprocedural hypothyroidism: Secondary | ICD-10-CM

## 2024-01-03 DIAGNOSIS — G8929 Other chronic pain: Secondary | ICD-10-CM

## 2024-01-03 DIAGNOSIS — M1712 Unilateral primary osteoarthritis, left knee: Secondary | ICD-10-CM

## 2024-01-03 DIAGNOSIS — M545 Low back pain, unspecified: Secondary | ICD-10-CM | POA: Diagnosis not present

## 2024-01-03 DIAGNOSIS — K5903 Drug induced constipation: Secondary | ICD-10-CM

## 2024-01-03 NOTE — Progress Notes (Unsigned)
 Location:  [facility] Room Number: 135 P Place of Service:  SNF ([phone]) Provider:  Tawni America, NP   Patient Care Team: Charlanne Fredia CROME, MD as PCP - General (Internal Medicine) Donnald Charleston, MD as Consulting Physician (Gastroenterology) Rubie Kemps, MD as Consulting Physician (Orthopedic Surgery) Gabriel Dames as Consulting Physician (Dermatology) Dominick Ned, MD as Consulting Physician (Cardiology) Leslee Reusing, MD as Consulting Physician (Ophthalmology) Lloyd Savannah, MD as Referring Physician (Dermatology)  Extended Emergency Contact Information Primary Emergency Contact: Wright,Janet  [place] Home Phone: [phone] Relation: Daughter Secondary Emergency Contact: [facility] Address: [address] [place] [address] [place] Home Phone: [phone] Mobile Phone: [phone] Relation: Son  Code Status: DNR MOST Goals of care: Advanced Directive information    11/01/2023    1:28 PM  Advanced Directives  Does Patient Have a Medical Advance Directive? Yes  Type of Estate agent of [place];Living will;Out of facility DNR (pink MOST or yellow form)  Does patient want to make changes to medical advance directive? No - Patient declined  Copy of Healthcare Power of Attorney in Chart? Yes - validated most recent copy scanned in chart (See row information)  Pre-existing out of facility DNR order (yellow form or pink MOST form) Pink MOST/Yellow Form most recent copy in chart - Physician notified to receive inpatient order     Chief Complaint  Patient presents with   Routine Visit    HPI:  Pt is a 89 y.o. female seen today for medical management of chronic diseases.     Past Medical History:  Diagnosis Date   Arthritis    all over   BACK PAIN 03/08/2007   Cancer (HCC)    squamous cell on R leg & face- ?basal cell   Cutaneous abscess of right  lower limb    DEPRESSIVE DISORDER 12/01/2008   Disruption of external operation (surgical) wound, not elsewhere classified, initial encounter    Duodenal ulcer    Essential (primary) hypertension    GI bleed 09/12/2010   Naproxen induced Endoscopy with dr jilda    HIATAL HERNIA 12/17/2006   History of hiatal hernia    HYPERLIPIDEMIA 03/09/2006   Hyperlipidemia, unspecified    HYPERTENSION 03/09/2006   KNEE PAIN 05/06/2009   Macular degeneration    Major depressive disorder, single episode, unspecified    Pneumonia    32yrs. ago- hosp. pneumonia   Urinary urgency    Past Surgical History:  Procedure Laterality Date   ABDOMINAL HYSTERECTOMY  1975   APPLICATION OF A-CELL OF EXTREMITY Right 12/10/2014   Procedure: APPLICATION OF A-CELL OF EXTREMITY;  Surgeon: Estefana Reichert, DO;  Location: [facility] OR;  Service: Plastics;  Laterality: Right;   arthroscopic knee Right    x 2   BACK SURGERY  2000   spinal fusion   BILATERAL SALPINGECTOMY Bilateral 1989   Dr. Ted   CARDIAC CATHETERIZATION     EYE SURGERY     cataracts bilateral - removed   HERNIA REPAIR Bilateral    inguinal    I & D EXTREMITY Right 12/10/2014   Procedure: IRRIGATION AND DEBRIDEMENT ON RIGHT LEG, ;  Surgeon: Estefana Reichert, DO;  Location: [facility] OR;  Service: Plastics;  Laterality: Right;   I & D EXTREMITY Right 01/20/2015   Procedure: IRRIGATION AND DEBRIDEMENT RIGHT LOWER LEG WOUND, with ACELL to Donor Site, SKIN GRAFT AND VAC Placement;  Surgeon: Estefana Reichert,  DO;  Location: [facility] OR;  Service: Plastics;  Laterality: Right;   LUMBAR LAMINECTOMY  02-17-1999   with sinal fusion L3,4,5,&S1   moes     moses surgery on R lle   OOPHORECTOMY  1989   RESECTION DISTAL CLAVICAL     ROTATOR CUFF REPAIR Right 2001   x2   SKIN GRAFT Right 05/29/2018   THYROIDECTOMY     TONSILLECTOMY  1928   TUBAL LIGATION      Allergies  Allergen Reactions   Naproxen     Gi bleed   Nsaids     GI BLEED   Aspirin     GI bleed   Ace  Inhibitors     Cough    Caffeine Other (See Comments)    Causes joints to be painful    Outpatient Encounter Medications as of 01/03/2024  Medication Sig   acetaminophen  (TYLENOL ) 500 MG tablet Take 1,000 mg by mouth 3 (three) times daily.   CALCIUM -VITAMIN D  PO Take by mouth.   camphor-menthol (SARNA) lotion Apply 1 Application topically every 8 (eight) hours as needed for itching.   Carboxymethylcellulose Sodium (THERATEARS) 0.25 % SOLN Apply 2-3 drops to eye daily as needed.   Emollient (CERAVE HEALING EX) Apply 1 application  topically daily as needed (bilateral lower leg xerosis cutis).   hydrALAZINE  (APRESOLINE ) 10 MG tablet Take 10 mg by mouth as needed. 10mg , oral, As needed, Hydralazine  10 mg TID PRN for SBP > 170   hydrALAZINE  (APRESOLINE ) 10 MG tablet Take 1 tablet (10 mg total) by mouth 3 (three) times daily.   losartan  (COZAAR ) 50 MG tablet Take 1 tablet (50 mg total) by mouth in the morning and at bedtime.   methocarbamol (ROBAXIN) 500 MG tablet Take 500 mg by mouth every 6 (six) hours as needed for muscle spasms.   metoprolol tartrate (LOPRESSOR) 25 MG tablet Take 12.5 mg by mouth 2 (two) times daily. Hold for HR <60   polyethylene glycol powder (GLYCOLAX /MIRALAX ) 17 GM/SCOOP powder Take 17 g by mouth every other day. (Patient taking differently: Take 17 g by mouth 2 (two) times a week. Every Tuesday and Friday)   senna (SENOKOT) 8.6 MG TABS tablet Take 2 tablets by mouth at bedtime as needed for mild constipation.   sertraline (ZOLOFT) 25 MG tablet Take 50 mg by mouth daily.   ipratropium-albuterol (DUONEB) 0.5-2.5 (3) MG/3ML SOLN Inhale 3 mLs into the lungs 2 (two) times daily. (Patient not taking: Reported on 01/03/2024)   No facility-administered encounter medications on file as of 01/03/2024.    Review of Systems  Immunization History  Administered Date(s) Administered   Fluad Quad(high Dose 65+) 04/05/2020   Fluad Trivalent(High Dose 65+) 03/20/2023   Influenza Split  03/08/2011, 03/05/2012   Influenza Whole 03/08/2007, 03/02/2009, 03/29/2010   Influenza, High Dose Seasonal PF 03/21/2013, 06/13/2017, 03/07/2019, 03/26/2020   Influenza,inj,Quad PF,6+ Mos 02/20/2014, 02/11/2015, 03/22/2018   Influenza-Unspecified 03/23/2016, 04/05/2020, 03/03/2022   Moderna Covid-19 Fall Seasonal Vaccine 101yrs & older 09/21/2022   Moderna Covid-19 Vaccine Bivalent Booster 15yrs & up 03/10/2021, 03/20/2023   Moderna SARS-COV2 Booster Vaccination 09/20/2020   Moderna Sars-Covid-2 Vaccination 06/10/2019, 07/09/2019, 10/13/2021, 04/03/2022   PFIZER(Purple Top)SARS-COV-2 Vaccination 04/05/2020   Pneumococcal Conjugate-13 07/30/2014   Pneumococcal Polysaccharide-23 02/03/2002, 12/16/2009   Td 02/03/2002   Tdap 12/11/2012, 12/23/2022   Zoster Recombinant(Shingrix) 05/10/2022, 07/11/2022   Pertinent  Health Maintenance Due  Topic Date Due   INFLUENZA VACCINE  12/28/2023   DEXA SCAN  Completed  08/08/2022    1:26 PM 11/20/2022    1:03 PM 12/22/2022    9:56 AM 01/09/2023    2:29 PM 04/23/2023    9:44 AM  Fall Risk  Falls in the past year? 1 1 1 1  0  Was there an injury with Fall? 1 1 1  0 0  Fall Risk Category Calculator 2 3 3 1  0  Patient at Risk for Falls Due to History of fall(s) History of fall(s);Impaired balance/gait;Impaired mobility;Impaired vision History of fall(s);Impaired balance/gait History of fall(s);Impaired balance/gait No Fall Risks  Fall risk Follow up Falls evaluation completed Falls evaluation completed Falls evaluation completed Falls evaluation completed;Education provided Falls evaluation completed   Functional Status Survey:    Vitals:   01/03/24 1116  BP: (!) 190/69  Pulse: 69  Resp: 18  Temp: (!) 97 F (36.1 C)  SpO2: 95%  Weight: 121 lb 12.8 oz (55.2 kg)  Height: 4' 11 (1.499 m)   Body mass index is 24.6 kg/m. Physical Exam  Labs reviewed: Recent Labs    02/02/23 0000 04/12/23 0000 09/18/23 0000  NA 140 137 139  K 4.7 4.5  4.0  CL 104 103 105  CO2 22 24* 25*  BUN 25* 25* 23*  CREATININE 0.7 0.6 0.4*  CALCIUM  8.7 9.1 8.4*   Recent Labs    09/18/23 0000  AST 13  ALT 6*  ALKPHOS 51   Recent Labs    04/12/23 0000  WBC 5.0  HGB 11.4*  HCT 35*  PLT 151   Lab Results  Component Value Date   TSH 1.74 04/13/2023   No results found for: HGBA1C Lab Results  Component Value Date   CHOL 238 (A) 08/20/2018   HDL 67 08/20/2018   LDLCALC 154 08/20/2018   LDLDIRECT 118.7 07/20/2011   TRIG 85 08/20/2018   CHOLHDL 4 12/30/2013    Significant Diagnostic Results in last 30 days:  No results found.  Assessment/Plan There are no diagnoses linked to this encounter.   Family/ staff Communication: ***  Labs/tests ordered:  ***

## 2024-01-04 ENCOUNTER — Encounter: Payer: Self-pay | Admitting: Adult Health

## 2024-01-04 DIAGNOSIS — F0390 Unspecified dementia without behavioral disturbance: Secondary | ICD-10-CM | POA: Insufficient documentation

## 2024-01-04 DIAGNOSIS — I480 Paroxysmal atrial fibrillation: Secondary | ICD-10-CM | POA: Insufficient documentation

## 2024-01-04 MED ORDER — POLYETHYLENE GLYCOL 3350 17 GM/SCOOP PO POWD: 17.0000 g | ORAL | Status: DC

## 2024-01-31 ENCOUNTER — Non-Acute Institutional Stay: Payer: Self-pay | Admitting: Adult Health

## 2024-01-31 ENCOUNTER — Encounter: Payer: Self-pay | Admitting: Adult Health

## 2024-01-31 NOTE — Progress Notes (Unsigned)
 Location:      Place of Service:    Provider:  Tawni America, NP    Patient Care Team: Charlanne Fredia CROME, MD as PCP - General (Internal Medicine) Donnald Charleston, MD as Consulting Physician (Gastroenterology) Rubie Kemps, MD as Consulting Physician (Orthopedic Surgery) Gabriel Dames as Consulting Physician (Dermatology) Leslee Reusing, MD as Consulting Physician (Ophthalmology) Lloyd Savannah, MD as Referring Physician (Dermatology)  Extended Emergency Contact Information Primary Emergency Contact: Wright,Janet  [place] Home Phone: [phone] Relation: Daughter Secondary Emergency Contact: [facility] Address: [address], [place] [address] [place] Home Phone: [phone] Mobile Phone: [phone] Relation: Son  Code Status:  DNR Goals of care: Advanced Directive information    11/01/2023    1:28 PM  Advanced Directives  Does Patient Have a Medical Advance Directive? Yes  Type of Estate agent of [place];Living will;Out of facility DNR (pink MOST or yellow form)  Does patient want to make changes to medical advance directive? No - Patient declined  Copy of Healthcare Power of Attorney in Chart? Yes - validated most recent copy scanned in chart (See row information)  Pre-existing out of facility DNR order (yellow form or pink MOST form) Pink MOST/Yellow Form most recent copy in chart - Physician notified to receive inpatient order     Chief Complaint  Patient presents with   Routine Visit    HPI:  Pt is a 89 y.o. female seen today for medical management of chronic diseases.     Past Medical History:  Diagnosis Date   Arthritis    all over   BACK PAIN 03/08/2007   Cancer (HCC)    squamous cell on R leg & face- ?basal cell   Cutaneous abscess of right lower limb    DEPRESSIVE DISORDER 12/01/2008   Disruption of external operation (surgical) wound, not elsewhere classified, initial  encounter    Duodenal ulcer    Essential (primary) hypertension    GI bleed 09/12/2010   Naproxen induced Endoscopy with dr jilda    HIATAL HERNIA 12/17/2006   History of hiatal hernia    HYPERLIPIDEMIA 03/09/2006   Hyperlipidemia, unspecified    HYPERTENSION 03/09/2006   KNEE PAIN 05/06/2009   Macular degeneration    Major depressive disorder, single episode, unspecified    Pneumonia    24yrs. ago- hosp. pneumonia   Urinary urgency    Past Surgical History:  Procedure Laterality Date   ABDOMINAL HYSTERECTOMY  1975   APPLICATION OF A-CELL OF EXTREMITY Right 12/10/2014   Procedure: APPLICATION OF A-CELL OF EXTREMITY;  Surgeon: Estefana Reichert, DO;  Location: [facility] OR;  Service: Plastics;  Laterality: Right;   arthroscopic knee Right    x 2   BACK SURGERY  2000   spinal fusion   BILATERAL SALPINGECTOMY Bilateral 1989   Dr. Ted   CARDIAC CATHETERIZATION     EYE SURGERY     cataracts bilateral - removed   HERNIA REPAIR Bilateral    inguinal    I & D EXTREMITY Right 12/10/2014   Procedure: IRRIGATION AND DEBRIDEMENT ON RIGHT LEG, ;  Surgeon: Estefana Reichert, DO;  Location: [facility] OR;  Service: Plastics;  Laterality: Right;   I & D EXTREMITY Right 01/20/2015   Procedure: IRRIGATION AND DEBRIDEMENT RIGHT LOWER LEG WOUND, with ACELL to Donor Site, SKIN GRAFT AND VAC Placement;  Surgeon: Estefana Reichert, DO;  Location: [facility] OR;  Service: Plastics;  Laterality: Right;  LUMBAR LAMINECTOMY  02-17-1999   with sinal fusion L3,4,5,&S1   moes     moses surgery on R lle   OOPHORECTOMY  1989   RESECTION DISTAL CLAVICAL     ROTATOR CUFF REPAIR Right 2001   x2   SKIN GRAFT Right 05/29/2018   THYROIDECTOMY     TONSILLECTOMY  1928   TUBAL LIGATION      Allergies  Allergen Reactions   Naproxen     Gi bleed   Nsaids     GI BLEED   Aspirin     GI bleed   Ace Inhibitors     Cough    Caffeine Other (See Comments)    Causes joints to be painful    Outpatient Encounter Medications as of  01/31/2024  Medication Sig   acetaminophen  (TYLENOL ) 500 MG tablet Take 1,000 mg by mouth 3 (three) times daily.   CALCIUM -VITAMIN D  PO Take by mouth.   camphor-menthol (SARNA) lotion Apply 1 Application topically every 8 (eight) hours as needed for itching.   Carboxymethylcellulose Sodium (THERATEARS) 0.25 % SOLN Apply 2-3 drops to eye daily as needed.   Emollient (CERAVE HEALING EX) Apply 1 application  topically daily as needed (bilateral lower leg xerosis cutis).   hydrALAZINE  (APRESOLINE ) 10 MG tablet Take 10 mg by mouth as needed. 10mg , oral, As needed, Hydralazine  10 mg TID PRN for SBP > 170   hydrALAZINE  (APRESOLINE ) 10 MG tablet Take 1 tablet (10 mg total) by mouth 3 (three) times daily.   losartan  (COZAAR ) 50 MG tablet Take 1 tablet (50 mg total) by mouth in the morning and at bedtime.   methocarbamol (ROBAXIN) 500 MG tablet Take 500 mg by mouth every 6 (six) hours as needed for muscle spasms.   metoprolol tartrate (LOPRESSOR) 25 MG tablet Take 12.5 mg by mouth 2 (two) times daily. Hold for HR <60   polyethylene glycol powder (GLYCOLAX /MIRALAX ) 17 GM/SCOOP powder Take 17 g by mouth 2 (two) times a week. Every Tuesday and Friday   senna (SENOKOT) 8.6 MG TABS tablet Take 2 tablets by mouth at bedtime as needed for mild constipation.   sertraline (ZOLOFT) 25 MG tablet Take 50 mg by mouth daily.   No facility-administered encounter medications on file as of 01/31/2024.    Review of Systems  Immunization History  Administered Date(s) Administered   Fluad Quad(high Dose 65+) 04/05/2020   Fluad Trivalent(High Dose 65+) 03/20/2023   INFLUENZA, HIGH DOSE SEASONAL PF 03/21/2013, 06/13/2017, 03/07/2019, 03/26/2020   Influenza Split 03/08/2011, 03/05/2012   Influenza Whole 03/08/2007, 03/02/2009, 03/29/2010   Influenza,inj,Quad PF,6+ Mos 02/20/2014, 02/11/2015, 03/22/2018   Influenza-Unspecified 03/23/2016, 04/05/2020, 03/03/2022   Moderna Covid-19 Fall Seasonal Vaccine 40yrs & older  09/21/2022   Moderna Covid-19 Vaccine Bivalent Booster 7yrs & up 03/10/2021, 03/20/2023   Moderna SARS-COV2 Booster Vaccination 09/20/2020   Moderna Sars-Covid-2 Vaccination 06/10/2019, 07/09/2019, 10/13/2021, 04/03/2022   PFIZER(Purple Top)SARS-COV-2 Vaccination 04/05/2020   Pneumococcal Conjugate-13 07/30/2014   Pneumococcal Polysaccharide-23 02/03/2002, 12/16/2009   Td 02/03/2002   Tdap 12/11/2012, 12/23/2022   Zoster Recombinant(Shingrix) 05/10/2022, 07/11/2022   Pertinent  Health Maintenance Due  Topic Date Due   INFLUENZA VACCINE  12/28/2023   DEXA SCAN  Completed      08/08/2022    1:26 PM 11/20/2022    1:03 PM 12/22/2022    9:56 AM 01/09/2023    2:29 PM 04/23/2023    9:44 AM  Fall Risk  Falls in the past year? 1 1 1 1  0  Was  there an injury with Fall? 1 1 1  0 0  Fall Risk Category Calculator 2 3 3 1  0  Patient at Risk for Falls Due to History of fall(s) History of fall(s);Impaired balance/gait;Impaired mobility;Impaired vision History of fall(s);Impaired balance/gait History of fall(s);Impaired balance/gait No Fall Risks  Fall risk Follow up Falls evaluation completed Falls evaluation completed Falls evaluation completed Falls evaluation completed;Education provided Falls evaluation completed   Functional Status Survey:    Vitals:   01/31/24 1108  BP: (!) 170/85  Pulse: 60  Resp: 20  Temp: 97.7 F (36.5 C)  SpO2: 95%  Weight: 119 lb 9.6 oz (54.3 kg)  Height: 4' 11 (1.499 m)   Body mass index is 24.16 kg/m. Physical Exam  Labs reviewed: Recent Labs    02/02/23 0000 04/12/23 0000 09/18/23 0000  NA 140 137 139  K 4.7 4.5 4.0  CL 104 103 105  CO2 22 24* 25*  BUN 25* 25* 23*  CREATININE 0.7 0.6 0.4*  CALCIUM  8.7 9.1 8.4*   Recent Labs    09/18/23 0000  AST 13  ALT 6*  ALKPHOS 51   Recent Labs    04/12/23 0000  WBC 5.0  HGB 11.4*  HCT 35*  PLT 151   Lab Results  Component Value Date   TSH 1.74 04/13/2023   No results found for:  HGBA1C Lab Results  Component Value Date   CHOL 238 (A) 08/20/2018   HDL 67 08/20/2018   LDLCALC 154 08/20/2018   LDLDIRECT 118.7 07/20/2011   TRIG 85 08/20/2018   CHOLHDL 4 12/30/2013    Significant Diagnostic Results in last 30 days:  No results found.  Assessment/Plan There are no diagnoses linked to this encounter.   Family/ staff Communication: ***  Labs/tests ordered:  ***

## 2024-02-01 NOTE — Progress Notes (Signed)
 This encounter was created in error - please disregard.

## 2024-03-11 ENCOUNTER — Encounter: Payer: Self-pay | Admitting: Internal Medicine

## 2024-03-11 ENCOUNTER — Non-Acute Institutional Stay (SKILLED_NURSING_FACILITY): Payer: Self-pay | Admitting: Internal Medicine

## 2024-03-11 DIAGNOSIS — M810 Age-related osteoporosis without current pathological fracture: Secondary | ICD-10-CM | POA: Diagnosis not present

## 2024-03-11 DIAGNOSIS — F0390 Unspecified dementia without behavioral disturbance: Secondary | ICD-10-CM

## 2024-03-11 DIAGNOSIS — E890 Postprocedural hypothyroidism: Secondary | ICD-10-CM

## 2024-03-11 DIAGNOSIS — I480 Paroxysmal atrial fibrillation: Secondary | ICD-10-CM | POA: Diagnosis not present

## 2024-03-11 DIAGNOSIS — I10 Essential (primary) hypertension: Secondary | ICD-10-CM | POA: Diagnosis not present

## 2024-03-11 DIAGNOSIS — F424 Excoriation (skin-picking) disorder: Secondary | ICD-10-CM

## 2024-03-11 NOTE — Progress Notes (Signed)
 Location:  [facility] Room Number: 135 P Place of Service:  SNF [id number]) Provider:  Charlanne Fredia CROME, MD  Patient Care Team: Charlanne Fredia CROME, MD as PCP - General (Internal Medicine) Donnald Charleston, MD as Consulting Physician (Gastroenterology) Rubie Kemps, MD as Consulting Physician (Orthopedic Surgery) Gabriel Dames as Consulting Physician (Dermatology) Leslee Reusing, MD as Consulting Physician (Ophthalmology) Lloyd Savannah, MD as Referring Physician (Dermatology)  Extended Emergency Contact Information Primary Emergency Contact: Wright,Janet  [place] Home Phone: [phone] Relation: Daughter Secondary Emergency Contact: [facility] Address: [address], [place] [address] [place] Home Phone: [phone] Mobile Phone: [phone] Relation: Son  Code Status:  DNR/ Follow MOST instructions/Hospice Goals of care: Advanced Directive information    11/01/2023    1:28 PM  Advanced Directives  Does Patient Have a Medical Advance Directive? Yes  Type of Estate Agent of [place];Living will;Out of facility DNR (pink MOST or yellow form)  Does patient want to make changes to medical advance directive? No - Patient declined  Copy of Healthcare Power of Attorney in Chart? Yes - validated most recent copy scanned in chart (See row information)  Pre-existing out of facility DNR order (yellow form or pink MOST form) Pink MOST/Yellow Form most recent copy in chart - Physician notified to receive inpatient order     Chief Complaint  Patient presents with   Routine Visit    HPI:  Pt is a 89 y.o. female seen today for medical management of chronic diseases.    Lives in SNF in [place]   Patient has a history of osteoporosis, trochanteric bursitis, left knee osteoarthritis, depression, hypertension    H/o Osteoporosis Prolia  was stopped as she is not ambulatory and Hospice now    Compulsive skin picking Better on Zoloft She also has developed expressive aphasia.  Patient also has now become more weaker and is needing Deitra for her transfers Uses Wheelchair mostly  No Falls or behaviors Continues to be pleasant  BP still running high most of the day Weight stable Wt Readings from Last 3 Encounters:  03/11/24 120 lb 6.4 oz (54.6 kg)  01/31/24 119 lb 9.6 oz (54.3 kg)  01/03/24 121 lb 12.8 oz (55.2 kg)    Past Medical History:  Diagnosis Date   Arthritis    all over   BACK PAIN 03/08/2007   Cancer (HCC)    squamous cell on R leg & face- ?basal cell   Cutaneous abscess of right lower limb    DEPRESSIVE DISORDER 12/01/2008   Disruption of external operation (surgical) wound, not elsewhere classified, initial encounter    Duodenal ulcer    Essential (primary) hypertension    GI bleed 09/12/2010   Naproxen induced Endoscopy with dr jilda    HIATAL HERNIA 12/17/2006   History of hiatal hernia    HYPERLIPIDEMIA 03/09/2006   Hyperlipidemia, unspecified    HYPERTENSION 03/09/2006   KNEE PAIN 05/06/2009   Macular degeneration    Major depressive disorder, single episode, unspecified    Pneumonia    41yrs. ago- hosp. pneumonia   Urinary urgency    Past Surgical History:  Procedure Laterality Date   ABDOMINAL HYSTERECTOMY  1975   APPLICATION OF A-CELL OF EXTREMITY Right 12/10/2014   Procedure: APPLICATION OF A-CELL OF EXTREMITY;  Surgeon: Estefana Reichert, DO;  Location: [facility] OR;  Service: Plastics;  Laterality: Right;   arthroscopic knee Right  x 2   BACK SURGERY  2000   spinal fusion   BILATERAL SALPINGECTOMY Bilateral 1989   Dr. Ted   CARDIAC CATHETERIZATION     EYE SURGERY     cataracts bilateral - removed   HERNIA REPAIR Bilateral    inguinal    I & D EXTREMITY Right 12/10/2014   Procedure: IRRIGATION AND DEBRIDEMENT ON RIGHT LEG, ;  Surgeon: Estefana Reichert, DO;  Location: [facility] OR;  Service: Plastics;  Laterality: Right;   I & D EXTREMITY Right  01/20/2015   Procedure: IRRIGATION AND DEBRIDEMENT RIGHT LOWER LEG WOUND, with ACELL to Donor Site, SKIN GRAFT AND VAC Placement;  Surgeon: Estefana Reichert, DO;  Location: [facility] OR;  Service: Plastics;  Laterality: Right;   LUMBAR LAMINECTOMY  02-17-1999   with sinal fusion L3,4,5,&S1   moes     moses surgery on R lle   OOPHORECTOMY  1989   RESECTION DISTAL CLAVICAL     ROTATOR CUFF REPAIR Right 2001   x2   SKIN GRAFT Right 05/29/2018   THYROIDECTOMY     TONSILLECTOMY  1928   TUBAL LIGATION      Allergies  Allergen Reactions   Naproxen     Gi bleed   Nsaids     GI BLEED   Aspirin     GI bleed   Ace Inhibitors     Cough    Caffeine Other (See Comments)    Causes joints to be painful    Outpatient Encounter Medications as of 03/11/2024  Medication Sig   acetaminophen  (TYLENOL ) 500 MG tablet Take 1,000 mg by mouth 3 (three) times daily.   CALCIUM -VITAMIN D  PO Take by mouth.   camphor-menthol (SARNA) lotion Apply 1 Application topically every 8 (eight) hours as needed for itching.   Carboxymethylcellulose Sodium (THERATEARS) 0.25 % SOLN Apply 2-3 drops to eye daily as needed.   Emollient (CERAVE HEALING EX) Apply 1 application  topically daily as needed (bilateral lower leg xerosis cutis).   hydrALAZINE  (APRESOLINE ) 10 MG tablet Take 10 mg by mouth as needed. 10mg , oral, As needed, Hydralazine  10 mg TID PRN for SBP > 170   hydrALAZINE  (APRESOLINE ) 10 MG tablet Take 1 tablet (10 mg total) by mouth 3 (three) times daily.   losartan  (COZAAR ) 50 MG tablet Take 1 tablet (50 mg total) by mouth in the morning and at bedtime.   methocarbamol (ROBAXIN) 500 MG tablet Take 500 mg by mouth every 6 (six) hours as needed for muscle spasms.   metoprolol tartrate (LOPRESSOR) 25 MG tablet Take 12.5 mg by mouth 2 (two) times daily. Hold for HR <60   polyethylene glycol powder (GLYCOLAX /MIRALAX ) 17 GM/SCOOP powder Take 17 g by mouth 2 (two) times a week. Every Tuesday and Friday   senna (SENOKOT) 8.6  MG TABS tablet Take 2 tablets by mouth at bedtime as needed for mild constipation.   sertraline (ZOLOFT) 25 MG tablet Take 50 mg by mouth daily.   No facility-administered encounter medications on file as of 03/11/2024.    Review of Systems  Unable to perform ROS: Dementia    Immunization History  Administered Date(s) Administered   Fluad Quad(high Dose 65+) 04/05/2020   Fluad Trivalent(High Dose 65+) 03/20/2023   INFLUENZA, HIGH DOSE SEASONAL PF 03/21/2013, 06/13/2017, 03/07/2019, 03/26/2020   Influenza Split 03/08/2011, 03/05/2012   Influenza Whole 03/08/2007, 03/02/2009, 03/29/2010   Influenza,inj,Quad PF,6+ Mos 02/20/2014, 02/11/2015, 03/22/2018   Influenza-Unspecified 03/23/2016, 04/05/2020, 03/03/2022, 02/26/2024   Moderna Covid-19 Fall Seasonal Vaccine 91yrs &  older 09/21/2022   Moderna Covid-19 Vaccine Bivalent Booster 71yrs & up 03/10/2021, 03/20/2023   Moderna SARS-COV2 Booster Vaccination 09/20/2020   Moderna Sars-Covid-2 Vaccination 06/10/2019, 07/09/2019, 10/13/2021, 04/03/2022   PFIZER(Purple Top)SARS-COV-2 Vaccination 04/05/2020   Pneumococcal Conjugate-13 07/30/2014   Pneumococcal Polysaccharide-23 02/03/2002, 12/16/2009   Td 02/03/2002   Tdap 12/11/2012, 12/23/2022   Unspecified SARS-COV-2 Vaccination 02/26/2024   Zoster Recombinant(Shingrix) 05/10/2022, 07/11/2022   Pertinent  Health Maintenance Due  Topic Date Due   Influenza Vaccine  Completed   DEXA SCAN  Completed      08/08/2022    1:26 PM 11/20/2022    1:03 PM 12/22/2022    9:56 AM 01/09/2023    2:29 PM 04/23/2023    9:44 AM  Fall Risk  Falls in the past year? 1 1 1 1  0  Was there an injury with Fall? 1 1 1  0 0  Fall Risk Category Calculator 2 3 3 1  0  Patient at Risk for Falls Due to History of fall(s) History of fall(s);Impaired balance/gait;Impaired mobility;Impaired vision History of fall(s);Impaired balance/gait History of fall(s);Impaired balance/gait No Fall Risks  Fall risk Follow up Falls  evaluation completed Falls evaluation completed Falls evaluation completed Falls evaluation completed;Education provided Falls evaluation completed   Functional Status Survey:    Vitals:   03/11/24 1116  BP: (!) 170/80  Pulse: 78  Resp: 16  Temp: 97.8 F (36.6 C)  SpO2: 95%  Weight: 120 lb 6.4 oz (54.6 kg)  Height: 4' 11 (1.499 m)   Body mass index is 24.32 kg/m. Physical Exam Vitals reviewed.  Constitutional:      Appearance: Normal appearance.  HENT:     Head: Normocephalic.     Nose: Nose normal.     Mouth/Throat:     Mouth: Mucous membranes are moist.     Pharynx: Oropharynx is clear.  Eyes:     Pupils: Pupils are equal, round, and reactive to light.  Cardiovascular:     Rate and Rhythm: Normal rate and regular rhythm.     Pulses: Normal pulses.     Heart sounds: Normal heart sounds. No murmur heard. Pulmonary:     Effort: Pulmonary effort is normal.     Breath sounds: Normal breath sounds.  Abdominal:     General: Abdomen is flat. Bowel sounds are normal.     Palpations: Abdomen is soft.  Musculoskeletal:        General: No swelling.     Cervical back: Neck supple.  Skin:    General: Skin is warm.  Neurological:     General: No focal deficit present.     Mental Status: She is alert.  Psychiatric:        Mood and Affect: Mood normal.        Thought Content: Thought content normal.     Labs reviewed: Recent Labs    04/12/23 0000 09/18/23 0000  NA 137 139  K 4.5 4.0  CL 103 105  CO2 24* 25*  BUN 25* 23*  CREATININE 0.6 0.4*  CALCIUM  9.1 8.4*   Recent Labs    09/18/23 0000  AST 13  ALT 6*  ALKPHOS 51   Recent Labs    04/12/23 0000  WBC 5.0  HGB 11.4*  HCT 35*  PLT 151   Lab Results  Component Value Date   TSH 1.74 04/13/2023   No results found for: HGBA1C Lab Results  Component Value Date   CHOL 238 (A) 08/20/2018   HDL 67 08/20/2018  LDLCALC 154 08/20/2018   LDLDIRECT 118.7 07/20/2011   TRIG 85 08/20/2018   CHOLHDL 4  12/30/2013    Significant Diagnostic Results in last 30 days:  No results found.  Assessment/Plan 1. Essential hypertension (Primary) BP somedays is running high and using Hydralazine  Prn Loose Control due to her age an frailty If continues ot have consitent high will change Meds Dosing 2. Compulsive skin picking On Zoloft  3. Senile osteoporosis Off Prolia  due to goals of care  4. History of partial thyroidectomy TSH normal in 11/24  5. Paroxysmal A-fib [facility]) Not on DOAC due to age Frailty  Lopressor   6. Dementia without behavioral disturbance (HCC) SNF level of care Enrolled in hospice   7 Arthritis On Tylenol    Family/ staff Communication:   Labs/tests ordered:

## 2024-04-18 ENCOUNTER — Non-Acute Institutional Stay (SKILLED_NURSING_FACILITY): Payer: Self-pay | Admitting: Adult Health

## 2024-04-18 ENCOUNTER — Encounter: Payer: Self-pay | Admitting: Adult Health

## 2024-04-18 DIAGNOSIS — I10 Essential (primary) hypertension: Secondary | ICD-10-CM

## 2024-04-18 DIAGNOSIS — I480 Paroxysmal atrial fibrillation: Secondary | ICD-10-CM | POA: Diagnosis not present

## 2024-04-18 DIAGNOSIS — K5909 Other constipation: Secondary | ICD-10-CM | POA: Diagnosis not present

## 2024-04-18 DIAGNOSIS — F0390 Unspecified dementia without behavioral disturbance: Secondary | ICD-10-CM

## 2024-04-18 DIAGNOSIS — M810 Age-related osteoporosis without current pathological fracture: Secondary | ICD-10-CM

## 2024-04-18 DIAGNOSIS — R4689 Other symptoms and signs involving appearance and behavior: Secondary | ICD-10-CM

## 2024-04-18 DIAGNOSIS — E890 Postprocedural hypothyroidism: Secondary | ICD-10-CM

## 2024-04-18 NOTE — Progress Notes (Unsigned)
 Location:  [facility] Room Number: 135 P Place of Service:  SNF ([phone]) Provider:  Tawni America, NP   Patient Care Team: Charlanne Fredia CROME, MD as PCP - General (Internal Medicine) Donnald Charleston, MD as Consulting Physician (Gastroenterology) Rubie Kemps, MD as Consulting Physician (Orthopedic Surgery) Gabriel Dames as Consulting Physician (Dermatology) Leslee Reusing, MD as Consulting Physician (Ophthalmology) Lloyd Savannah, MD as Referring Physician (Dermatology)  Extended Emergency Contact Information Primary Emergency Contact: Wright,Janet  [place] Home Phone: [phone] Relation: Daughter Secondary Emergency Contact: [facility] Address: [address] [place] [address] [place] Home Phone: [phone] Mobile Phone: [phone] Relation: Son  Code Status:  DNR / Follow Most Instructions Goals of care: Advanced Directive information    11/01/2023    1:28 PM  Advanced Directives  Does Patient Have a Medical Advance Directive? Yes  Type of Estate Agent of [place];Living will;Out of facility DNR (pink MOST or yellow form)  Does patient want to make changes to medical advance directive? No - Patient declined  Copy of Healthcare Power of Attorney in Chart? Yes - validated most recent copy scanned in chart (See row information)  Pre-existing out of facility DNR order (yellow form or pink MOST form) Pink MOST/Yellow Form most recent copy in chart - Physician notified to receive inpatient order     Chief Complaint  Patient presents with   Routine Visit    HPI:  Pt is a 89 y.o. female seen today for medical management of chronic diseases.     Past Medical History:  Diagnosis Date   Arthritis    all over   BACK PAIN 03/08/2007   Cancer (HCC)    squamous cell on R leg & face- ?basal cell   Cutaneous abscess of right lower limb    DEPRESSIVE DISORDER  12/01/2008   Disruption of external operation (surgical) wound, not elsewhere classified, initial encounter    Duodenal ulcer    Essential (primary) hypertension    GI bleed 09/12/2010   Naproxen induced Endoscopy with dr jilda    HIATAL HERNIA 12/17/2006   History of hiatal hernia    HYPERLIPIDEMIA 03/09/2006   Hyperlipidemia, unspecified    HYPERTENSION 03/09/2006   KNEE PAIN 05/06/2009   Macular degeneration    Major depressive disorder, single episode, unspecified    Pneumonia    67yrs. ago- hosp. pneumonia   Urinary urgency    Past Surgical History:  Procedure Laterality Date   ABDOMINAL HYSTERECTOMY  1975   APPLICATION OF A-CELL OF EXTREMITY Right 12/10/2014   Procedure: APPLICATION OF A-CELL OF EXTREMITY;  Surgeon: Estefana Reichert, DO;  Location: [facility] OR;  Service: Plastics;  Laterality: Right;   arthroscopic knee Right    x 2   BACK SURGERY  2000   spinal fusion   BILATERAL SALPINGECTOMY Bilateral 1989   Dr. Ted   CARDIAC CATHETERIZATION     EYE SURGERY     cataracts bilateral - removed   HERNIA REPAIR Bilateral    inguinal    I & D EXTREMITY Right 12/10/2014   Procedure: IRRIGATION AND DEBRIDEMENT ON RIGHT LEG, ;  Surgeon: Estefana Reichert, DO;  Location: [facility] OR;  Service: Plastics;  Laterality: Right;   I & D EXTREMITY Right 01/20/2015   Procedure: IRRIGATION AND DEBRIDEMENT RIGHT LOWER LEG WOUND, with ACELL to Donor Site, SKIN GRAFT AND VAC Placement;  Surgeon: Estefana Reichert, DO;  Location:  [facility] OR;  Service: Plastics;  Laterality: Right;   LUMBAR LAMINECTOMY  02-17-1999   with sinal fusion L3,4,5,&S1   moes     moses surgery on R lle   OOPHORECTOMY  1989   RESECTION DISTAL CLAVICAL     ROTATOR CUFF REPAIR Right 2001   x2   SKIN GRAFT Right 05/29/2018   THYROIDECTOMY     TONSILLECTOMY  1928   TUBAL LIGATION      Allergies  Allergen Reactions   Naproxen     Gi bleed   Nsaids     GI BLEED   Aspirin     GI bleed   Ace Inhibitors     Cough    Caffeine Other  (See Comments)    Causes joints to be painful    Outpatient Encounter Medications as of 04/18/2024  Medication Sig   acetaminophen  (TYLENOL ) 500 MG tablet Take 1,000 mg by mouth 3 (three) times daily.   CALCIUM -VITAMIN D  PO Take by mouth.   camphor-menthol (SARNA) lotion Apply 1 Application topically every 8 (eight) hours as needed for itching.   Carboxymethylcellulose Sodium (THERATEARS) 0.25 % SOLN Apply 2-3 drops to eye daily as needed.   Emollient (CERAVE HEALING EX) Apply 1 application  topically daily as needed (bilateral lower leg xerosis cutis).   guaiFENesin (ROBITUSSIN) 100 MG/5ML liquid Take 5 mLs by mouth every 4 (four) hours as needed for cough or to loosen phlegm.   hydrALAZINE  (APRESOLINE ) 10 MG tablet Take 10 mg by mouth as needed. 10mg , oral, As needed, Hydralazine  10 mg TID PRN for SBP > 170   hydrALAZINE  (APRESOLINE ) 10 MG tablet Take 1 tablet (10 mg total) by mouth 3 (three) times daily.   ipratropium-albuterol (DUONEB) 0.5-2.5 (3) MG/3ML SOLN Inhale 3 mLs into the lungs every 4 (four) hours as needed.   losartan  (COZAAR ) 50 MG tablet Take 1 tablet (50 mg total) by mouth in the morning and at bedtime.   methocarbamol (ROBAXIN) 500 MG tablet Take 500 mg by mouth every 6 (six) hours as needed for muscle spasms.   metoprolol tartrate (LOPRESSOR) 25 MG tablet Take 12.5 mg by mouth 2 (two) times daily. Hold for HR <60   polyethylene glycol powder (GLYCOLAX /MIRALAX ) 17 GM/SCOOP powder Take 17 g by mouth 2 (two) times a week. Every Tuesday and Friday   senna (SENOKOT) 8.6 MG TABS tablet Take 2 tablets by mouth at bedtime as needed for mild constipation.   sertraline (ZOLOFT) 25 MG tablet Take 50 mg by mouth daily.   No facility-administered encounter medications on file as of 04/18/2024.    Review of Systems  Immunization History  Administered Date(s) Administered   Fluad Quad(high Dose 65+) 04/05/2020   Fluad Trivalent(High Dose 65+) 03/20/2023   INFLUENZA, HIGH DOSE  SEASONAL PF 03/21/2013, 06/13/2017, 03/07/2019, 03/26/2020   Influenza Split 03/08/2011, 03/05/2012   Influenza Whole 03/08/2007, 03/02/2009, 03/29/2010   Influenza,inj,Quad PF,6+ Mos 02/20/2014, 02/11/2015, 03/22/2018   Influenza-Unspecified 03/23/2016, 04/05/2020, 03/03/2022, 02/26/2024   Moderna Covid-19 Fall Seasonal Vaccine 71yrs & older 09/21/2022   Moderna Covid-19 Vaccine Bivalent Booster 28yrs & up 03/10/2021, 03/20/2023   Moderna SARS-COV2 Booster Vaccination 09/20/2020   Moderna Sars-Covid-2 Vaccination 06/10/2019, 07/09/2019, 10/13/2021, 04/03/2022   PFIZER(Purple Top)SARS-COV-2 Vaccination 04/05/2020   Pneumococcal Conjugate-13 07/30/2014   Pneumococcal Polysaccharide-23 02/03/2002, 12/16/2009   Pneumococcal-Unspecified 02/03/2022   Td 02/03/2002   Tdap 12/11/2012, 12/23/2022   Unspecified SARS-COV-2 Vaccination 02/26/2024   Zoster Recombinant(Shingrix) 05/10/2022, 07/11/2022   Pertinent  Health Maintenance Due  Topic  Date Due   Influenza Vaccine  Completed   Bone Density Scan  Completed      08/08/2022    1:26 PM 11/20/2022    1:03 PM 12/22/2022    9:56 AM 01/09/2023    2:29 PM 04/23/2023    9:44 AM  Fall Risk  Falls in the past year? 1 1 1 1  0  Was there an injury with Fall? 1 1 1  0 0  Fall Risk Category Calculator 2 3 3 1  0  Patient at Risk for Falls Due to History of fall(s) History of fall(s);Impaired balance/gait;Impaired mobility;Impaired vision History of fall(s);Impaired balance/gait History of fall(s);Impaired balance/gait No Fall Risks  Fall risk Follow up Falls evaluation completed Falls evaluation completed Falls evaluation completed Falls evaluation completed;Education provided Falls evaluation completed   Functional Status Survey:    Vitals:   04/18/24 1037  BP: (!) 185/67  Pulse: 76  Resp: 20  Temp: 97.7 F (36.5 C)  SpO2: 95%  Weight: 122 lb 6.4 oz (55.5 kg)  Height: 4' 11 (1.499 m)   Body mass index is 24.72 kg/m. Physical Exam  Labs  reviewed: Recent Labs    09/18/23 0000  NA 139  K 4.0  CL 105  CO2 25*  BUN 23*  CREATININE 0.4*  CALCIUM  8.4*   Recent Labs    09/18/23 0000  AST 13  ALT 6*  ALKPHOS 51   No results for input(s): WBC, NEUTROABS, HGB, HCT, MCV, PLT in the last 8760 hours. Lab Results  Component Value Date   TSH 1.74 04/13/2023   No results found for: HGBA1C Lab Results  Component Value Date   CHOL 238 (A) 08/20/2018   HDL 67 08/20/2018   LDLCALC 154 08/20/2018   LDLDIRECT 118.7 07/20/2011   TRIG 85 08/20/2018   CHOLHDL 4 12/30/2013    Significant Diagnostic Results in last 30 days:  No results found.  Assessment/Plan There are no diagnoses linked to this encounter.   Family/ staff Communication: ***  Labs/tests ordered:  ***

## 2024-04-19 ENCOUNTER — Encounter: Payer: Self-pay | Admitting: Adult Health

## 2024-05-14 DIAGNOSIS — L570 Actinic keratosis: Secondary | ICD-10-CM | POA: Diagnosis not present

## 2024-05-15 ENCOUNTER — Non-Acute Institutional Stay (SKILLED_NURSING_FACILITY): Payer: Self-pay | Admitting: Adult Health

## 2024-05-15 DIAGNOSIS — M1712 Unilateral primary osteoarthritis, left knee: Secondary | ICD-10-CM

## 2024-05-15 DIAGNOSIS — E890 Postprocedural hypothyroidism: Secondary | ICD-10-CM | POA: Diagnosis not present

## 2024-05-15 DIAGNOSIS — I10 Essential (primary) hypertension: Secondary | ICD-10-CM | POA: Diagnosis not present

## 2024-05-15 DIAGNOSIS — F0390 Unspecified dementia without behavioral disturbance: Secondary | ICD-10-CM

## 2024-05-15 DIAGNOSIS — K5903 Drug induced constipation: Secondary | ICD-10-CM

## 2024-05-15 DIAGNOSIS — I480 Paroxysmal atrial fibrillation: Secondary | ICD-10-CM

## 2024-05-15 DIAGNOSIS — K449 Diaphragmatic hernia without obstruction or gangrene: Secondary | ICD-10-CM | POA: Diagnosis not present

## 2024-05-15 DIAGNOSIS — L989 Disorder of the skin and subcutaneous tissue, unspecified: Secondary | ICD-10-CM

## 2024-05-15 DIAGNOSIS — K5909 Other constipation: Secondary | ICD-10-CM

## 2024-05-15 DIAGNOSIS — M810 Age-related osteoporosis without current pathological fracture: Secondary | ICD-10-CM

## 2024-05-18 ENCOUNTER — Encounter: Payer: Self-pay | Admitting: Adult Health

## 2024-05-18 MED ORDER — POLYETHYLENE GLYCOL 3350 17 GM/SCOOP PO POWD: 17.0000 g | ORAL | Status: AC | PRN

## 2024-05-18 NOTE — Progress Notes (Signed)
 " Location:  [facility]:  SNF (31) HOSPICE Provider:  Tawni America, NP   Patient Care Team: Charlanne Fredia CROME, MD as PCP - General (Internal Medicine) Donnald Charleston, MD as Consulting Physician (Gastroenterology) Rubie Kemps, MD as Consulting Physician (Orthopedic Surgery) Gabriel Dames as Consulting Physician (Dermatology) Leslee Reusing, MD as Consulting Physician (Ophthalmology) Lloyd Savannah, MD as Referring Physician (Dermatology)  Extended Emergency Contact Information Primary Emergency Contact: Wright,Janet  [place] Home Phone: [phone] Relation: Daughter Secondary Emergency Contact: [facility] Address: [phone], [place] [address] [place] Mobile Phone: [phone] Relation: Son  Code Status:  DNR / Follow Most Instructions Goals of care: Advanced Directive information    11/01/2023    1:28 PM  Advanced Directives  Does Patient Have a Medical Advance Directive? Yes  Type of Estate Agent of [place];Living will;Out of facility DNR (pink MOST or yellow form)  Does patient want to make changes to medical advance directive? No - Patient declined  Copy of Healthcare Power of Attorney in Chart? Yes - validated most recent copy scanned in chart (See row information)  Pre-existing out of facility DNR order (yellow form or pink MOST form) Pink MOST/Yellow Form most recent copy in chart - Physician notified to receive inpatient order     Chief Complaint  Patient presents with   Medical Management of Chronic Issues    HPI:  Pt is a 89 y.o. female seen today for medical management of chronic diseases.    Resides in skilled care and follow by hospice due to dementia and age 69  HTN: BP 120-190/60-80 on losartan , hydralazine  and metoprolol,  uses prn hydralazine    Zoloft for mood and picking behaviors. Has a couple of open areas to her face form  picking and staff uses dressings to cover them.  These areas were treated by dermatology with cryotherapy this week. The picking improved with zoloft but still exists.   Chronic back pain and knee OA on scheduled tylenol  and prn robaxin. No acute complaints.   OP: was on prolia , now discontinued   T score -4.5 04/15/19, worse from 2018 -4.3  No longer bearing weight. Calcium  was low at 8.4 on prolia  at the time so calcium  tabs were started.   afib/flutter in Nov of 2024. Due to her goals of care, fall risk and age she was not placed on DOAC. On metoprolol. She is not having palpitations or cp. Rate is controlled  Constipation on miralax  and senokot, no reported issues.    Umbilical hernia: no current issues. Also has hiatal hernia.    Past Medical History:  Diagnosis Date   Arthritis    all over   BACK PAIN 03/08/2007   Cancer (HCC)    squamous cell on R leg & face- ?basal cell   Cutaneous abscess of right lower limb    DEPRESSIVE DISORDER 12/01/2008   Disruption of external operation (surgical) wound, not elsewhere classified, initial encounter    Duodenal ulcer    Essential (primary) hypertension    GI bleed 09/12/2010   Naproxen induced Endoscopy with dr jilda    HIATAL HERNIA 12/17/2006   History of hiatal hernia    HYPERLIPIDEMIA 03/09/2006   Hyperlipidemia, unspecified    HYPERTENSION 03/09/2006   KNEE PAIN 05/06/2009   Macular degeneration    Major depressive disorder, single episode, unspecified    Pneumonia  33yrs. ago- hosp. pneumonia   Urinary urgency    Past Surgical History:  Procedure Laterality Date   ABDOMINAL HYSTERECTOMY  1975   APPLICATION OF A-CELL OF EXTREMITY Right 12/10/2014   Procedure: APPLICATION OF A-CELL OF EXTREMITY;  Surgeon: Estefana Reichert, DO;  Location: [facility] OR;  Service: Plastics;  Laterality: Right;   arthroscopic knee Right    x 2   BACK SURGERY  2000   spinal fusion   BILATERAL SALPINGECTOMY Bilateral 1989   Dr. Ted    CARDIAC CATHETERIZATION     EYE SURGERY     cataracts bilateral - removed   HERNIA REPAIR Bilateral    inguinal    I & D EXTREMITY Right 12/10/2014   Procedure: IRRIGATION AND DEBRIDEMENT ON RIGHT LEG, ;  Surgeon: Estefana Reichert, DO;  Location: [facility] OR;  Service: Plastics;  Laterality: Right;   I & D EXTREMITY Right 01/20/2015   Procedure: IRRIGATION AND DEBRIDEMENT RIGHT LOWER LEG WOUND, with ACELL to Donor Site, SKIN GRAFT AND VAC Placement;  Surgeon: Estefana Reichert, DO;  Location: [facility] OR;  Service: Plastics;  Laterality: Right;   LUMBAR LAMINECTOMY  02-17-1999   with sinal fusion L3,4,5,&S1   moes     moses surgery on R lle   OOPHORECTOMY  1989   RESECTION DISTAL CLAVICAL     ROTATOR CUFF REPAIR Right 2001   x2   SKIN GRAFT Right 05/29/2018   THYROIDECTOMY     TONSILLECTOMY  1928   TUBAL LIGATION      Allergies  Allergen Reactions   Naproxen     Gi bleed   Nsaids     GI BLEED   Aspirin     GI bleed   Ace Inhibitors     Cough    Caffeine Other (See Comments)    Causes joints to be painful    Outpatient Encounter Medications as of 05/15/2024  Medication Sig   acetaminophen  (TYLENOL ) 500 MG tablet Take 1,000 mg by mouth 3 (three) times daily.   CALCIUM -VITAMIN D  PO Take by mouth.   camphor-menthol (SARNA) lotion Apply 1 Application topically every 8 (eight) hours as needed for itching.   Carboxymethylcellulose Sodium (THERATEARS) 0.25 % SOLN Apply 2-3 drops to eye daily as needed.   Emollient (CERAVE HEALING EX) Apply 1 application  topically daily as needed (bilateral lower leg xerosis cutis).   guaiFENesin (ROBITUSSIN) 100 MG/5ML liquid Take 5 mLs by mouth every 4 (four) hours as needed for cough or to loosen phlegm.   hydrALAZINE  (APRESOLINE ) 10 MG tablet Take 10 mg by mouth as needed. 10mg , oral, As needed, Hydralazine  10 mg TID PRN for SBP > 170   hydrALAZINE  (APRESOLINE ) 10 MG tablet Take 1 tablet (10 mg total) by mouth 3 (three) times daily.   ipratropium-albuterol  (DUONEB) 0.5-2.5 (3) MG/3ML SOLN Inhale 3 mLs into the lungs every 4 (four) hours as needed.   losartan  (COZAAR ) 50 MG tablet Take 1 tablet (50 mg total) by mouth in the morning and at bedtime.   methocarbamol (ROBAXIN) 500 MG tablet Take 500 mg by mouth every 6 (six) hours as needed for muscle spasms.   metoprolol tartrate (LOPRESSOR) 25 MG tablet Take 12.5 mg by mouth 2 (two) times daily. Hold for HR <60   polyethylene glycol powder (GLYCOLAX /MIRALAX ) 17 GM/SCOOP powder Take 17 g by mouth 2 (two) times a week. Every Tuesday and Friday   senna (SENOKOT) 8.6 MG TABS tablet Take 2 tablets by mouth at bedtime as needed for  mild constipation.   sertraline (ZOLOFT) 25 MG tablet Take 50 mg by mouth daily.   No facility-administered encounter medications on file as of 05/15/2024.    Review of Systems  Constitutional:  Negative for activity change, appetite change, chills, diaphoresis, fatigue and fever.  HENT:  Negative for congestion.   Respiratory:  Negative for cough, shortness of breath and wheezing.   Cardiovascular:  Negative for chest pain and leg swelling.  Gastrointestinal:  Negative for abdominal distention, abdominal pain, constipation, diarrhea, nausea and vomiting.  Genitourinary:  Negative for difficulty urinating, dysuria and urgency.  Musculoskeletal:  Positive for arthralgias and gait problem. Negative for back pain, myalgias and neck pain.  Skin:  Positive for wound. Negative for rash.  Neurological:  Negative for dizziness and weakness.  Psychiatric/Behavioral:  Positive for confusion. The patient is not nervous/anxious.     Immunization History  Administered Date(s) Administered   Fluad Quad(high Dose 65+) 04/05/2020   Fluad Trivalent(High Dose 65+) 03/20/2023   INFLUENZA, HIGH DOSE SEASONAL PF 03/21/2013, 06/13/2017, 03/07/2019, 03/26/2020   Influenza Split 03/08/2011, 03/05/2012   Influenza Whole 03/08/2007, 03/02/2009, 03/29/2010   Influenza,inj,Quad PF,6+ Mos  02/20/2014, 02/11/2015, 03/22/2018   Influenza-Unspecified 03/23/2016, 04/05/2020, 03/03/2022, 02/26/2024   Moderna Covid-19 Fall Seasonal Vaccine 62yrs & older 09/21/2022   Moderna Covid-19 Vaccine Bivalent Booster 41yrs & up 03/10/2021, 03/20/2023   Moderna SARS-COV2 Booster Vaccination 09/20/2020   Moderna Sars-Covid-2 Vaccination 06/10/2019, 07/09/2019, 10/13/2021, 04/03/2022   PFIZER(Purple Top)SARS-COV-2 Vaccination 04/05/2020   Pneumococcal Conjugate-13 07/30/2014   Pneumococcal Polysaccharide-23 02/03/2002, 12/16/2009   Pneumococcal-Unspecified 02/03/2022   Td 02/03/2002   Tdap 12/11/2012, 12/23/2022   Unspecified SARS-COV-2 Vaccination 02/26/2024   Zoster Recombinant(Shingrix) 05/10/2022, 07/11/2022   Pertinent  Health Maintenance Due  Topic Date Due   Influenza Vaccine  Completed   Bone Density Scan  Completed      08/08/2022    1:26 PM 11/20/2022    1:03 PM 12/22/2022    9:56 AM 01/09/2023    2:29 PM 04/23/2023    9:44 AM  Fall Risk  Falls in the past year? 1 1 1 1  0  Was there an injury with Fall? 1  1  1   0  0   Fall Risk Category Calculator 2 3 3 1  0  Patient at Risk for Falls Due to History of fall(s) History of fall(s);Impaired balance/gait;Impaired mobility;Impaired vision History of fall(s);Impaired balance/gait History of fall(s);Impaired balance/gait No Fall Risks  Fall risk Follow up Falls evaluation completed Falls evaluation completed Falls evaluation completed Falls evaluation completed;Education provided Falls evaluation completed     Data saved with a previous flowsheet row definition   Functional Status Survey:    Vitals:   05/18/24 0715  BP: (!) 148/79  Pulse: 76  Resp: 16  SpO2: 91%  Weight: 123 lb (55.8 kg)   Body mass index is 24.84 kg/m. Physical Exam Vitals reviewed.  Constitutional:      General: She is not in acute distress.    Appearance: She is not diaphoretic.  HENT:     Head: Normocephalic and atraumatic.     Nose: Nose  normal.     Mouth/Throat:     Mouth: Mucous membranes are moist.     Pharynx: Oropharynx is clear.  Neck:     Vascular: No JVD.  Cardiovascular:     Rate and Rhythm: Normal rate and regular rhythm.     Heart sounds: No murmur heard. Pulmonary:     Effort: Pulmonary effort is normal. No respiratory  distress.     Breath sounds: Normal breath sounds. No wheezing.  Abdominal:     General: Abdomen is flat. Bowel sounds are normal.     Palpations: Abdomen is soft.     Hernia: A hernia is present.  Musculoskeletal:     Right lower leg: No edema.     Left lower leg: No edema.  Skin:    General: Skin is warm and dry.     Comments: Chin with erythematous scaly skin lesion  Neurological:     General: No focal deficit present.     Mental Status: She is alert. Mental status is at baseline.     Labs reviewed: Recent Labs    09/18/23 0000  NA 139  K 4.0  CL 105  CO2 25*  BUN 23*  CREATININE 0.4*  CALCIUM  8.4*   Recent Labs    09/18/23 0000  AST 13  ALT 6*  ALKPHOS 51   No results for input(s): WBC, NEUTROABS, HGB, HCT, MCV, PLT in the last 8760 hours. Lab Results  Component Value Date   TSH 1.74 04/13/2023   No results found for: HGBA1C Lab Results  Component Value Date   CHOL 238 (A) 08/20/2018   HDL 67 08/20/2018   LDLCALC 154 08/20/2018   LDLDIRECT 118.7 07/20/2011   TRIG 85 08/20/2018   CHOLHDL 4 12/30/2013    Significant Diagnostic Results in last 30 days:  No results found.  Assessment/Plan  1. Essential hypertension (Primary) Above goal at times, avoid aggressive treatment due to her age and goals of care Continue current regimen.   2. Chronic constipation On miralax  and senokot prn  3. Compulsive scratching behavior Continue zoloft   4. Chronic midline low back pain without sciatica Controlled with tylenol  Prn Robaxin   5. History of partial thyroidectomy Hx of  Normal TSH   6. Senile osteoporosis Off prolia   7. Primary  osteoarthritis of left knee On tylenol  no current complaints.   8. Paroxysmal A-fib (HCC) Rate is controlled with metoprolol  9. Dementia without behavioral disturbance Progressing over time Now under hospice care Very pleasant Appropriate for a skilled level of care.   10. Skin lesion To chin and cheek area Cryotherapy performed by dermatology   "

## 2024-06-16 ENCOUNTER — Encounter: Payer: Self-pay | Admitting: Internal Medicine

## 2024-06-16 ENCOUNTER — Non-Acute Institutional Stay (SKILLED_NURSING_FACILITY): Payer: Self-pay | Admitting: Internal Medicine

## 2024-06-16 DIAGNOSIS — F0390 Unspecified dementia without behavioral disturbance: Secondary | ICD-10-CM

## 2024-06-16 DIAGNOSIS — M810 Age-related osteoporosis without current pathological fracture: Secondary | ICD-10-CM | POA: Diagnosis not present

## 2024-06-16 DIAGNOSIS — M545 Low back pain, unspecified: Secondary | ICD-10-CM | POA: Diagnosis not present

## 2024-06-16 DIAGNOSIS — I10 Essential (primary) hypertension: Secondary | ICD-10-CM

## 2024-06-16 DIAGNOSIS — I480 Paroxysmal atrial fibrillation: Secondary | ICD-10-CM | POA: Diagnosis not present

## 2024-06-16 DIAGNOSIS — E890 Postprocedural hypothyroidism: Secondary | ICD-10-CM

## 2024-06-16 DIAGNOSIS — G8929 Other chronic pain: Secondary | ICD-10-CM | POA: Diagnosis not present

## 2024-06-16 DIAGNOSIS — F424 Excoriation (skin-picking) disorder: Secondary | ICD-10-CM | POA: Diagnosis not present

## 2024-06-16 MED ORDER — HYDRALAZINE HCL 25 MG PO TABS: 25.0000 mg | ORAL_TABLET | Freq: Three times a day (TID) | ORAL | Status: AC

## 2024-06-16 NOTE — Progress Notes (Signed)
 "  Location:  [facility] Illustrator of Service:  SNF (31)  Provider:   Code Status: DNR/Hospice Goals of Care:     11/01/2023    1:28 PM  Advanced Directives  Does Patient Have a Medical Advance Directive? Yes  Type of Estate Agent of [place];Living will;Out of facility DNR (pink MOST or yellow form)  Does patient want to make changes to medical advance directive? No - Patient declined  Copy of Healthcare Power of Attorney in Chart? Yes - validated most recent copy scanned in chart (See row information)  Pre-existing out of facility DNR order (yellow form or pink MOST form) Pink MOST/Yellow Form most recent copy in chart - Physician notified to receive inpatient order     Chief Complaint  Patient presents with   Care Management    HPI: Patient is a 89 y.o. female seen today for medical management of chronic diseases.    Lives in SNF in [place]   Patient has a history of osteoporosis, trochanteric bursitis, left knee osteoarthritis, depression, hypertension    H/o Osteoporosis Prolia  was stopped as she is not ambulatory and Hospice now  Still has some area in her Chin where she Picks Otherwise she is stable No New issues Her BP Sill running high sometimes SBP above 170-190 Patient unable to give history Does have more Aphasia now Needs Hoyer for transfers Uses Wheelchair Mostly Wt Readings from Last 3 Encounters:  06/16/24 123 lb (55.8 kg)  05/18/24 123 lb (55.8 kg)  04/18/24 122 lb 6.4 oz (55.5 kg)     Past Medical History:  Diagnosis Date   Arthritis    all over   BACK PAIN 03/08/2007   Cancer (HCC)    squamous cell on R leg & face- ?basal cell   Cutaneous abscess of right lower limb    DEPRESSIVE DISORDER 12/01/2008   Disruption of external operation (surgical) wound, not elsewhere classified, initial encounter    Duodenal ulcer    Essential (primary) hypertension    GI bleed 09/12/2010   Naproxen induced Endoscopy  with dr jilda    HIATAL HERNIA 12/17/2006   History of hiatal hernia    HYPERLIPIDEMIA 03/09/2006   Hyperlipidemia, unspecified    HYPERTENSION 03/09/2006   KNEE PAIN 05/06/2009   Macular degeneration    Major depressive disorder, single episode, unspecified    Pneumonia    37yrs. ago- hosp. pneumonia   Urinary urgency     Past Surgical History:  Procedure Laterality Date   ABDOMINAL HYSTERECTOMY  1975   APPLICATION OF A-CELL OF EXTREMITY Right 12/10/2014   Procedure: APPLICATION OF A-CELL OF EXTREMITY;  Surgeon: Estefana Reichert, DO;  Location: [facility] OR;  Service: Plastics;  Laterality: Right;   arthroscopic knee Right    x 2   BACK SURGERY  2000   spinal fusion   BILATERAL SALPINGECTOMY Bilateral 1989   Dr. Ted   CARDIAC CATHETERIZATION     EYE SURGERY     cataracts bilateral - removed   HERNIA REPAIR Bilateral    inguinal    I & D EXTREMITY Right 12/10/2014   Procedure: IRRIGATION AND DEBRIDEMENT ON RIGHT LEG, ;  Surgeon: Estefana Reichert, DO;  Location: [facility] OR;  Service: Plastics;  Laterality: Right;   I & D EXTREMITY Right 01/20/2015   Procedure: IRRIGATION AND DEBRIDEMENT RIGHT LOWER LEG WOUND, with ACELL to Donor Site, SKIN GRAFT AND VAC Placement;  Surgeon: Estefana Reichert, DO;  Location: [facility] OR;  Service:  Plastics;  Laterality: Right;   LUMBAR LAMINECTOMY  02-17-1999   with sinal fusion L3,4,5,&S1   moes     moses surgery on R lle   OOPHORECTOMY  1989   RESECTION DISTAL CLAVICAL     ROTATOR CUFF REPAIR Right 2001   x2   SKIN GRAFT Right 05/29/2018   THYROIDECTOMY     TONSILLECTOMY  1928   TUBAL LIGATION      Allergies[1]  Outpatient Encounter Medications as of 06/16/2024  Medication Sig   acetaminophen  (TYLENOL ) 500 MG tablet Take 1,000 mg by mouth 3 (three) times daily.   CALCIUM -VITAMIN D  PO Take by mouth.   camphor-menthol (SARNA) lotion Apply 1 Application topically every 8 (eight) hours as needed for itching.   Carboxymethylcellulose Sodium (THERATEARS) 0.25 %  SOLN Apply 2-3 drops to eye daily as needed.   Emollient (CERAVE HEALING EX) Apply 1 application  topically daily as needed (bilateral lower leg xerosis cutis).   guaiFENesin (ROBITUSSIN) 100 MG/5ML liquid Take 5 mLs by mouth every 4 (four) hours as needed for cough or to loosen phlegm.   hydrALAZINE  (APRESOLINE ) 10 MG tablet Take 10 mg by mouth daily as needed. Every day prn after 4pm if SBP >170   hydrALAZINE  (APRESOLINE ) 10 MG tablet Take 1 tablet (10 mg total) by mouth 3 (three) times daily.   ipratropium-albuterol (DUONEB) 0.5-2.5 (3) MG/3ML SOLN Inhale 3 mLs into the lungs every 4 (four) hours as needed.   losartan  (COZAAR ) 50 MG tablet Take 1 tablet (50 mg total) by mouth in the morning and at bedtime.   methocarbamol (ROBAXIN) 500 MG tablet Take 500 mg by mouth every 6 (six) hours as needed for muscle spasms.   metoprolol tartrate (LOPRESSOR) 25 MG tablet Take 12.5 mg by mouth 2 (two) times daily. Hold for HR <60   polyethylene glycol powder (GLYCOLAX /MIRALAX ) 17 GM/SCOOP powder Take 17 g by mouth as needed. Every Tuesday and Friday prn   senna (SENOKOT) 8.6 MG TABS tablet Take 2 tablets by mouth at bedtime as needed for mild constipation.   sertraline (ZOLOFT) 25 MG tablet Take 50 mg by mouth daily.   No facility-administered encounter medications on file as of 06/16/2024.    Review of Systems:  Review of Systems  Unable to perform ROS: Dementia    Health Maintenance  Topic Date Due   Medicare Annual Wellness (AWV)  12/22/2023   COVID-19 Vaccine (10 - Mixed Product risk 2025-26 season) 08/25/2024   DTaP/Tdap/Td (4 - Td or Tdap) 12/22/2032   Pneumococcal Vaccine: 50+ Years  Completed   Influenza Vaccine  Completed   Bone Density Scan  Completed   Zoster Vaccines- Shingrix  Completed   Meningococcal B Vaccine  Aged Out    Physical Exam: Vitals:   06/16/24 1432  BP: (!) 144/80  Pulse: 76  Resp: 18  Temp: 97.7 F (36.5 C)  Weight: 123 lb (55.8 kg)   Body mass index is  24.84 kg/m. Physical Exam Vitals reviewed.  Constitutional:      Appearance: Normal appearance.  HENT:     Head: Normocephalic.     Nose: Nose normal.     Mouth/Throat:     Mouth: Mucous membranes are moist.     Pharynx: Oropharynx is clear.  Eyes:     Pupils: Pupils are equal, round, and reactive to light.  Cardiovascular:     Rate and Rhythm: Normal rate and regular rhythm.     Pulses: Normal pulses.     Heart  sounds: Normal heart sounds. No murmur heard. Pulmonary:     Effort: Pulmonary effort is normal.     Breath sounds: Normal breath sounds.  Abdominal:     General: Abdomen is flat. Bowel sounds are normal.     Palpations: Abdomen is soft.  Musculoskeletal:        General: No swelling.     Cervical back: Neck supple.  Skin:    General: Skin is warm.     Comments: Has 2 spot on her Chin where she has been picking  Neurological:     General: No focal deficit present.     Mental Status: She is alert.  Psychiatric:        Mood and Affect: Mood normal.        Thought Content: Thought content normal.     Labs reviewed: Basic Metabolic Panel: Recent Labs    09/18/23 0000  NA 139  K 4.0  CL 105  CO2 25*  BUN 23*  CREATININE 0.4*  CALCIUM  8.4*   Liver Function Tests: Recent Labs    09/18/23 0000  AST 13  ALT 6*  ALKPHOS 51   No results for input(s): LIPASE, AMYLASE in the last 8760 hours. No results for input(s): AMMONIA in the last 8760 hours. CBC: No results for input(s): WBC, NEUTROABS, HGB, HCT, MCV, PLT in the last 8760 hours. Lipid Panel: No results for input(s): CHOL, HDL, LDLCALC, TRIG, CHOLHDL, LDLDIRECT in the last 8760 hours. No results found for: HGBA1C  Procedures since last visit: No results found.  Assessment/Plan 1. Essential hypertension (Primary) Change Hydralazine  to 25 mg TID Check BP readings in 2 weeks Trying to keep Loose Control 2. Dementia without behavioral disturbance (HCC) Now  enrolled in Hospice  3. Compulsive skin picking Zoloft D/W nurses to cover the place in her chin for it to not get infected  4. History of partial thyroidectomy TSH normal in 03/2023  5. Paroxysmal A-fib (HCC) Not on DOAC due to her Age  On Lopressor 6. Chronic midline low back pain without sciatica Tylenol   7. Senile osteoporosis Prolia  stopped due to Goals of care    Labs/tests ordered:  TSH * No order type specified * Next appt:  Visit date not found        [1]  Allergies Allergen Reactions   Naproxen     Gi bleed   Nsaids     GI BLEED   Aspirin     GI bleed   Ace Inhibitors     Cough    Caffeine Other (See Comments)    Causes joints to be painful   "

## 2024-07-01 ENCOUNTER — Encounter: Payer: Self-pay | Admitting: Internal Medicine

## 2024-07-01 MED ORDER — AMLODIPINE BESYLATE 2.5 MG PO TABS: 2.5000 mg | ORAL_TABLET | Freq: Every day | ORAL | Status: AC

## 2024-07-01 NOTE — Progress Notes (Signed)
 BP readings show SBP running more then 180 Will start her on Norvasc  2.5 mg QD
